# Patient Record
Sex: Female | Born: 1950
Health system: Southern US, Community
[De-identification: ages and names within clinical notes are randomized; demographics above are authoritative.]

## PROBLEM LIST (undated history)

## (undated) DIAGNOSIS — Z8739 Personal history of other diseases of the musculoskeletal system and connective tissue: Secondary | ICD-10-CM

## (undated) DIAGNOSIS — M1990 Unspecified osteoarthritis, unspecified site: Secondary | ICD-10-CM

## (undated) DIAGNOSIS — E7800 Pure hypercholesterolemia, unspecified: Secondary | ICD-10-CM

## (undated) DIAGNOSIS — N189 Chronic kidney disease, unspecified: Secondary | ICD-10-CM

## (undated) DIAGNOSIS — E785 Hyperlipidemia, unspecified: Secondary | ICD-10-CM

## (undated) DIAGNOSIS — Z973 Presence of spectacles and contact lenses: Secondary | ICD-10-CM

## (undated) DIAGNOSIS — Z87442 Personal history of urinary calculi: Secondary | ICD-10-CM

## (undated) DIAGNOSIS — N183 Chronic kidney disease, stage 3 unspecified: Secondary | ICD-10-CM

## (undated) DIAGNOSIS — D631 Anemia in chronic kidney disease: Secondary | ICD-10-CM

## (undated) DIAGNOSIS — Z860101 Personal history of adenomatous and serrated colon polyps: Secondary | ICD-10-CM

## (undated) DIAGNOSIS — H3530 Unspecified macular degeneration: Secondary | ICD-10-CM

## (undated) DIAGNOSIS — M7061 Trochanteric bursitis, right hip: Secondary | ICD-10-CM

## (undated) DIAGNOSIS — Z8669 Personal history of other diseases of the nervous system and sense organs: Secondary | ICD-10-CM

## (undated) DIAGNOSIS — N950 Postmenopausal bleeding: Secondary | ICD-10-CM

## (undated) DIAGNOSIS — J40 Bronchitis, not specified as acute or chronic: Secondary | ICD-10-CM

## (undated) DIAGNOSIS — D649 Anemia, unspecified: Secondary | ICD-10-CM

## (undated) DIAGNOSIS — G629 Polyneuropathy, unspecified: Secondary | ICD-10-CM

## (undated) DIAGNOSIS — T4145XA Adverse effect of unspecified anesthetic, initial encounter: Secondary | ICD-10-CM

## (undated) DIAGNOSIS — E119 Type 2 diabetes mellitus without complications: Secondary | ICD-10-CM

## (undated) DIAGNOSIS — T7840XA Allergy, unspecified, initial encounter: Secondary | ICD-10-CM

## (undated) DIAGNOSIS — D259 Leiomyoma of uterus, unspecified: Secondary | ICD-10-CM

## (undated) DIAGNOSIS — K635 Polyp of colon: Secondary | ICD-10-CM

## (undated) DIAGNOSIS — J302 Other seasonal allergic rhinitis: Secondary | ICD-10-CM

## (undated) DIAGNOSIS — I10 Essential (primary) hypertension: Secondary | ICD-10-CM

## (undated) DIAGNOSIS — R935 Abnormal findings on diagnostic imaging of other abdominal regions, including retroperitoneum: Secondary | ICD-10-CM

## (undated) DIAGNOSIS — K219 Gastro-esophageal reflux disease without esophagitis: Secondary | ICD-10-CM

## (undated) DIAGNOSIS — T8859XA Other complications of anesthesia, initial encounter: Secondary | ICD-10-CM

## (undated) DIAGNOSIS — K279 Peptic ulcer, site unspecified, unspecified as acute or chronic, without hemorrhage or perforation: Secondary | ICD-10-CM

## (undated) DIAGNOSIS — G2581 Restless legs syndrome: Secondary | ICD-10-CM

## (undated) DIAGNOSIS — Z8711 Personal history of peptic ulcer disease: Secondary | ICD-10-CM

## (undated) HISTORY — DX: Allergy, unspecified, initial encounter: T78.40XA

## (undated) HISTORY — PX: COLONOSCOPY: SHX174

## (undated) HISTORY — PX: COMBINED AUGMENTATION MAMMAPLASTY AND ABDOMINOPLASTY: SUR291

## (undated) HISTORY — DX: Chronic kidney disease, unspecified: N18.9

## (undated) HISTORY — PX: LAPAROSCOPIC GASTRIC BANDING: SHX1100

## (undated) HISTORY — PX: POLYPECTOMY: SHX149

## (undated) HISTORY — PX: BREAST SURGERY: SHX581

## (undated) HISTORY — PX: OTHER SURGICAL HISTORY: SHX169

## (undated) HISTORY — PX: CHOLECYSTECTOMY: SHX55

## (undated) HISTORY — DX: Peptic ulcer, site unspecified, unspecified as acute or chronic, without hemorrhage or perforation: K27.9

## (undated) HISTORY — PX: ABDOMINOPLASTY: SUR9

## (undated) HISTORY — PX: REDUCTION MAMMAPLASTY: SUR839

## (undated) HISTORY — DX: Polyp of colon: K63.5

---

## 1998-07-15 HISTORY — PX: CHOLECYSTECTOMY, LAPAROSCOPIC: SHX56

## 1998-07-15 HISTORY — PX: CHOLECYSTECTOMY: SHX55

## 2008-02-12 ENCOUNTER — Emergency Department (HOSPITAL_COMMUNITY): Admission: EM | Admit: 2008-02-12 | Discharge: 2008-02-12 | Payer: Self-pay | Admitting: Emergency Medicine

## 2008-03-13 ENCOUNTER — Emergency Department (HOSPITAL_COMMUNITY): Admission: EM | Admit: 2008-03-13 | Discharge: 2008-03-13 | Payer: Self-pay | Admitting: Emergency Medicine

## 2011-04-12 LAB — BASIC METABOLIC PANEL
BUN: 11
CO2: 28
Chloride: 107
Creatinine, Ser: 0.98
Glucose, Bld: 143 — ABNORMAL HIGH

## 2011-04-12 LAB — CBC
MCHC: 33.3
MCV: 79.6
Platelets: 225
RDW: 15.4

## 2011-04-12 LAB — URINE MICROSCOPIC-ADD ON

## 2011-04-12 LAB — DIFFERENTIAL
Basophils Relative: 0
Eosinophils Absolute: 0.1
Neutrophils Relative %: 81 — ABNORMAL HIGH

## 2011-04-12 LAB — URINALYSIS, ROUTINE W REFLEX MICROSCOPIC
Bilirubin Urine: NEGATIVE
Nitrite: NEGATIVE
Specific Gravity, Urine: 1.025
Urobilinogen, UA: 0.2

## 2012-07-15 HISTORY — PX: LAPAROSCOPIC GASTRIC BANDING: SHX1100

## 2015-06-29 DIAGNOSIS — I10 Essential (primary) hypertension: Secondary | ICD-10-CM | POA: Diagnosis not present

## 2015-06-29 DIAGNOSIS — H5200 Hypermetropia, unspecified eye: Secondary | ICD-10-CM | POA: Diagnosis not present

## 2015-06-29 DIAGNOSIS — E109 Type 1 diabetes mellitus without complications: Secondary | ICD-10-CM | POA: Diagnosis not present

## 2015-06-29 DIAGNOSIS — H5210 Myopia, unspecified eye: Secondary | ICD-10-CM | POA: Diagnosis not present

## 2015-06-29 DIAGNOSIS — E119 Type 2 diabetes mellitus without complications: Secondary | ICD-10-CM | POA: Diagnosis not present

## 2015-07-10 ENCOUNTER — Ambulatory Visit (HOSPITAL_COMMUNITY)
Admission: RE | Admit: 2015-07-10 | Discharge: 2015-07-10 | Disposition: A | Discharge status: Home / Self Care | Payer: Commercial Managed Care - HMO | Source: Ambulatory Visit | Attending: Surgery | Admitting: Surgery

## 2015-07-10 ENCOUNTER — Other Ambulatory Visit (HOSPITAL_COMMUNITY): Payer: Self-pay | Admitting: Surgery

## 2015-07-10 DIAGNOSIS — E669 Obesity, unspecified: Secondary | ICD-10-CM | POA: Diagnosis not present

## 2015-07-19 DIAGNOSIS — M1712 Unilateral primary osteoarthritis, left knee: Secondary | ICD-10-CM | POA: Diagnosis not present

## 2015-07-19 DIAGNOSIS — M170 Bilateral primary osteoarthritis of knee: Secondary | ICD-10-CM | POA: Diagnosis not present

## 2015-07-19 DIAGNOSIS — M1711 Unilateral primary osteoarthritis, right knee: Secondary | ICD-10-CM | POA: Diagnosis not present

## 2015-08-09 DIAGNOSIS — M170 Bilateral primary osteoarthritis of knee: Secondary | ICD-10-CM | POA: Diagnosis not present

## 2015-08-09 DIAGNOSIS — M1711 Unilateral primary osteoarthritis, right knee: Secondary | ICD-10-CM | POA: Diagnosis not present

## 2015-09-07 DIAGNOSIS — M170 Bilateral primary osteoarthritis of knee: Secondary | ICD-10-CM | POA: Diagnosis not present

## 2015-09-15 DIAGNOSIS — M170 Bilateral primary osteoarthritis of knee: Secondary | ICD-10-CM | POA: Diagnosis not present

## 2015-09-22 DIAGNOSIS — M25569 Pain in unspecified knee: Secondary | ICD-10-CM | POA: Diagnosis not present

## 2015-09-22 DIAGNOSIS — F321 Major depressive disorder, single episode, moderate: Secondary | ICD-10-CM | POA: Diagnosis not present

## 2015-09-22 DIAGNOSIS — Z6834 Body mass index (BMI) 34.0-34.9, adult: Secondary | ICD-10-CM | POA: Diagnosis not present

## 2015-09-22 DIAGNOSIS — E7800 Pure hypercholesterolemia, unspecified: Secondary | ICD-10-CM | POA: Diagnosis not present

## 2015-09-22 DIAGNOSIS — Z1389 Encounter for screening for other disorder: Secondary | ICD-10-CM | POA: Diagnosis not present

## 2015-09-22 DIAGNOSIS — E668 Other obesity: Secondary | ICD-10-CM | POA: Diagnosis not present

## 2015-09-22 DIAGNOSIS — R5383 Other fatigue: Secondary | ICD-10-CM | POA: Diagnosis not present

## 2015-09-22 DIAGNOSIS — E119 Type 2 diabetes mellitus without complications: Secondary | ICD-10-CM | POA: Diagnosis not present

## 2015-09-22 DIAGNOSIS — M170 Bilateral primary osteoarthritis of knee: Secondary | ICD-10-CM | POA: Diagnosis not present

## 2015-10-11 DIAGNOSIS — R69 Illness, unspecified: Secondary | ICD-10-CM | POA: Diagnosis not present

## 2015-10-23 DIAGNOSIS — I10 Essential (primary) hypertension: Secondary | ICD-10-CM | POA: Diagnosis not present

## 2015-10-23 DIAGNOSIS — F321 Major depressive disorder, single episode, moderate: Secondary | ICD-10-CM | POA: Diagnosis not present

## 2015-10-23 DIAGNOSIS — Z6834 Body mass index (BMI) 34.0-34.9, adult: Secondary | ICD-10-CM | POA: Diagnosis not present

## 2015-10-23 DIAGNOSIS — E119 Type 2 diabetes mellitus without complications: Secondary | ICD-10-CM | POA: Diagnosis not present

## 2015-10-23 DIAGNOSIS — E7800 Pure hypercholesterolemia, unspecified: Secondary | ICD-10-CM | POA: Diagnosis not present

## 2015-10-23 DIAGNOSIS — E668 Other obesity: Secondary | ICD-10-CM | POA: Diagnosis not present

## 2015-10-23 DIAGNOSIS — Z9884 Bariatric surgery status: Secondary | ICD-10-CM | POA: Diagnosis not present

## 2015-11-27 ENCOUNTER — Encounter (HOSPITAL_COMMUNITY): Payer: Self-pay | Admitting: Emergency Medicine

## 2015-11-27 ENCOUNTER — Emergency Department (HOSPITAL_COMMUNITY)
Admission: EM | Admit: 2015-11-27 | Discharge: 2015-11-27 | Disposition: A | Discharge status: Home / Self Care | Payer: Commercial Managed Care - HMO | Attending: Emergency Medicine | Admitting: Emergency Medicine

## 2015-11-27 ENCOUNTER — Emergency Department (HOSPITAL_COMMUNITY): Payer: Commercial Managed Care - HMO

## 2015-11-27 DIAGNOSIS — Z87891 Personal history of nicotine dependence: Secondary | ICD-10-CM | POA: Insufficient documentation

## 2015-11-27 DIAGNOSIS — R05 Cough: Secondary | ICD-10-CM | POA: Diagnosis not present

## 2015-11-27 DIAGNOSIS — J209 Acute bronchitis, unspecified: Secondary | ICD-10-CM | POA: Diagnosis not present

## 2015-11-27 DIAGNOSIS — J9801 Acute bronchospasm: Secondary | ICD-10-CM | POA: Diagnosis not present

## 2015-11-27 DIAGNOSIS — E119 Type 2 diabetes mellitus without complications: Secondary | ICD-10-CM | POA: Diagnosis not present

## 2015-11-27 DIAGNOSIS — I10 Essential (primary) hypertension: Secondary | ICD-10-CM | POA: Insufficient documentation

## 2015-11-27 DIAGNOSIS — J40 Bronchitis, not specified as acute or chronic: Secondary | ICD-10-CM | POA: Diagnosis not present

## 2015-11-27 HISTORY — DX: Type 2 diabetes mellitus without complications: E11.9

## 2015-11-27 HISTORY — DX: Pure hypercholesterolemia, unspecified: E78.00

## 2015-11-27 HISTORY — DX: Essential (primary) hypertension: I10

## 2015-11-27 MED ORDER — ALBUTEROL SULFATE (2.5 MG/3ML) 0.083% IN NEBU
2.5000 mg | INHALATION_SOLUTION | Freq: Once | RESPIRATORY_TRACT | Status: AC
  Administered 2015-11-27: 2.5 mg via RESPIRATORY_TRACT
  Filled 2015-11-27: qty 3

## 2015-11-27 MED ORDER — AZITHROMYCIN 250 MG PO TABS: 250.0000 mg | ORAL_TABLET | Freq: Every day | ORAL | Status: DC

## 2015-11-27 MED ORDER — BENZONATATE 200 MG PO CAPS: 200.0000 mg | ORAL_CAPSULE | Freq: Three times a day (TID) | ORAL | Status: DC | PRN

## 2015-11-27 MED ORDER — ALBUTEROL SULFATE HFA 108 (90 BASE) MCG/ACT IN AERS
2.0000 | INHALATION_SPRAY | RESPIRATORY_TRACT | Status: DC | PRN
  Administered 2015-11-27: 2 via RESPIRATORY_TRACT
  Filled 2015-11-27: qty 6.7

## 2015-11-27 MED ORDER — PREDNISONE 50 MG PO TABS
60.0000 mg | ORAL_TABLET | Freq: Once | ORAL | Status: AC
  Administered 2015-11-27: 60 mg via ORAL
  Filled 2015-11-27: qty 1

## 2015-11-27 MED ORDER — IPRATROPIUM-ALBUTEROL 0.5-2.5 (3) MG/3ML IN SOLN
3.0000 mL | Freq: Once | RESPIRATORY_TRACT | Status: AC
  Administered 2015-11-27: 3 mL via RESPIRATORY_TRACT
  Filled 2015-11-27: qty 3

## 2015-11-27 NOTE — Discharge Instructions (Signed)
Use the inhaler 2 puffs every 4 hours as needed for wheezing. Take the cough pills and the antibiotics as directed. Follow up with your doctor or return here as needed for worsening symptoms.

## 2015-11-27 NOTE — ED Notes (Signed)
Patient reports had cold symptoms about a week ago. States she started feeling better, but now has a congested cough and feels as if she is wheezing at times. Patient also complains of post nasal drip.

## 2015-11-27 NOTE — ED Provider Notes (Signed)
CSN: [id number]     Arrival date & time 11/27/15  1947 History   First MD Initiated Contact with Patient 11/27/15 2033     Chief Complaint  Patient presents with  . Cough     (Consider location/radiation/quality/duration/timing/severity/associated sxs/prior Treatment) Patient is a 65 y.o. female presenting with cough. The history is provided by the patient.  Cough Cough characteristics:  Productive Severity:  Moderate Onset quality:  Gradual Duration:  1 week Timing:  Intermittent Chronicity:  New Smoker: former smoker.   Relieved by:  Nothing Worsened by:  Lying down Ineffective treatments:  None tried Associated symptoms: wheezing     Amy Reyes is a 65 y.o. female who presents to the ED with cough and wheezing that started about a week ago. She reports that the cold symptoms improved but she continues to wheeze and have some coughing. She denies any other problems.   Past Medical History  Diagnosis Date  . Hypertension   . Diabetes mellitus without complication ([place])   . Hypercholesteremia    Past Surgical History  Procedure Laterality Date  . Laparoscopic gastric banding     History reviewed. No pertinent family history. Social History  Substance Use Topics  . Smoking status: Former Research scientist (life sciences)  . Smokeless tobacco: None  . Alcohol Use: No   OB History    No data available     Review of Systems  HENT: Positive for congestion.   Respiratory: Positive for cough and wheezing.   all other systems negative    Allergies  Review of patient's allergies indicates no known allergies.  Home Medications   Prior to Admission medications   Medication Sig Start Date End Date Taking? Authorizing Provider  azithromycin (ZITHROMAX) 250 MG tablet Take 1 tablet (250 mg total) by mouth daily. Take first 2 tablets together, then 1 every day until finished. 11/27/15   Hope Bunnie Pion, NP  benzonatate (TESSALON) 200 MG capsule Take 1 capsule (200 mg total) by mouth 3 (three)  times daily as needed for cough. 11/27/15   Hope Bunnie Pion, NP   BP 140/62 mmHg  Pulse 64  Temp(Src) 98.7 F (37.1 C) (Oral)  Resp 20  Ht 5\' 6"  (1.676 m)  Wt 92.987 kg  BMI 33.10 kg/m2  SpO2 100% Physical Exam  Constitutional: She is oriented to person, place, and time. She appears well-developed and well-nourished. No distress.  HENT:  Head: Normocephalic and atraumatic.  Eyes: EOM are normal.  Neck: Neck supple.  Cardiovascular: Normal rate and regular rhythm.   Pulmonary/Chest: Effort normal. No respiratory distress. She has decreased breath sounds. She has wheezes.  Expiratory wheezes bilateral  Abdominal: Soft. There is no tenderness.  Musculoskeletal: Normal range of motion.  Neurological: She is alert and oriented to person, place, and time. No cranial nerve deficit.  Skin: Skin is warm and dry.  Psychiatric: She has a normal mood and affect. Her behavior is normal.  Nursing note and vitals reviewed.   ED Course  Procedures (including critical care time) Discussed with Dr. Roderic Palau, will start Z-pak, cough medication, albuterol inhaler Patient declined course of steroids due to weight issues and effect steroids has on her appetite. Will give one dose of 60 mg x1 now.  Labs Review Labs Reviewed - No data to display  Imaging Review Dg Chest 2 View  11/27/2015  CLINICAL DATA:  Patient reports had cold symptoms about a week ago. States she started feeling better, but now has a congested cough and feels as  if she is wheezing at times. Patient also complains of post nasal drip. EXAM: CHEST  2 VIEW COMPARISON:  None. FINDINGS: Normal mediastinum and cardiac silhouette. Normal pulmonary vasculature. No evidence of effusion, infiltrate, or pneumothorax. No acute bony abnormality. Gastric banding device noted and appears in typical orientation IMPRESSION: No acute cardiopulmonary process. Electronically Signed   By: Suzy Bouchard M.D.   On: 11/27/2015 20:34    MDM  65 y.o. female  with cough and wheezing x 1 weeks stable for d/c without respiratory distress and O2 SAT 100% on R/A and normal CXR. Discussed with the patient clinical and x-ray findings and plan of care and all questioned fully answered. She will follow up with her PCP or return here if any problems arise.   Final diagnoses:  Bronchitis with bronchospasm       Ashley Murrain, NP 11/27/15 2130  Milton Ferguson, MD 11/28/15 [phone]

## 2015-11-27 NOTE — ED Notes (Signed)
Pt with recent cold symptoms, productive. Denies fever, post nasal drip as well per pt

## 2015-12-15 DIAGNOSIS — Z0100 Encounter for examination of eyes and vision without abnormal findings: Secondary | ICD-10-CM | POA: Diagnosis not present

## 2016-04-10 DIAGNOSIS — M170 Bilateral primary osteoarthritis of knee: Secondary | ICD-10-CM | POA: Diagnosis not present

## 2016-04-29 DIAGNOSIS — I10 Essential (primary) hypertension: Secondary | ICD-10-CM | POA: Diagnosis not present

## 2016-04-29 DIAGNOSIS — E119 Type 2 diabetes mellitus without complications: Secondary | ICD-10-CM | POA: Diagnosis not present

## 2016-04-30 DIAGNOSIS — F321 Major depressive disorder, single episode, moderate: Secondary | ICD-10-CM | POA: Diagnosis not present

## 2016-04-30 DIAGNOSIS — Z0000 Encounter for general adult medical examination without abnormal findings: Secondary | ICD-10-CM | POA: Diagnosis not present

## 2016-04-30 DIAGNOSIS — Z79890 Hormone replacement therapy (postmenopausal): Secondary | ICD-10-CM | POA: Diagnosis not present

## 2016-04-30 DIAGNOSIS — E7800 Pure hypercholesterolemia, unspecified: Secondary | ICD-10-CM | POA: Diagnosis not present

## 2016-04-30 DIAGNOSIS — E1129 Type 2 diabetes mellitus with other diabetic kidney complication: Secondary | ICD-10-CM | POA: Diagnosis not present

## 2016-04-30 DIAGNOSIS — E668 Other obesity: Secondary | ICD-10-CM | POA: Diagnosis not present

## 2016-04-30 DIAGNOSIS — R05 Cough: Secondary | ICD-10-CM | POA: Diagnosis not present

## 2016-04-30 DIAGNOSIS — D692 Other nonthrombocytopenic purpura: Secondary | ICD-10-CM | POA: Diagnosis not present

## 2016-04-30 DIAGNOSIS — M25569 Pain in unspecified knee: Secondary | ICD-10-CM | POA: Diagnosis not present

## 2016-05-03 DIAGNOSIS — Z1212 Encounter for screening for malignant neoplasm of rectum: Secondary | ICD-10-CM | POA: Diagnosis not present

## 2016-05-23 DIAGNOSIS — M25561 Pain in right knee: Secondary | ICD-10-CM | POA: Diagnosis not present

## 2016-05-23 DIAGNOSIS — G8929 Other chronic pain: Secondary | ICD-10-CM | POA: Diagnosis not present

## 2016-05-23 DIAGNOSIS — M25562 Pain in left knee: Secondary | ICD-10-CM | POA: Diagnosis not present

## 2016-05-23 DIAGNOSIS — M170 Bilateral primary osteoarthritis of knee: Secondary | ICD-10-CM | POA: Diagnosis not present

## 2016-09-02 ENCOUNTER — Encounter (HOSPITAL_COMMUNITY): Admission: RE | Payer: Self-pay | Source: Ambulatory Visit

## 2016-09-02 ENCOUNTER — Inpatient Hospital Stay (HOSPITAL_COMMUNITY)
Admission: RE | Admit: 2016-09-02 | Payer: Commercial Managed Care - HMO | Source: Ambulatory Visit | Admitting: Orthopedic Surgery

## 2016-09-02 SURGERY — ARTHROPLASTY, KNEE, TOTAL
Anesthesia: Choice | Site: Knee | Laterality: Right

## 2016-10-03 ENCOUNTER — Ambulatory Visit: Payer: Self-pay | Admitting: Orthopedic Surgery

## 2016-10-03 DIAGNOSIS — I129 Hypertensive chronic kidney disease with stage 1 through stage 4 chronic kidney disease, or unspecified chronic kidney disease: Secondary | ICD-10-CM | POA: Diagnosis not present

## 2016-10-03 DIAGNOSIS — M25562 Pain in left knee: Secondary | ICD-10-CM | POA: Diagnosis not present

## 2016-10-03 DIAGNOSIS — E7800 Pure hypercholesterolemia, unspecified: Secondary | ICD-10-CM | POA: Diagnosis not present

## 2016-10-03 DIAGNOSIS — E668 Other obesity: Secondary | ICD-10-CM | POA: Diagnosis not present

## 2016-10-03 DIAGNOSIS — M25561 Pain in right knee: Secondary | ICD-10-CM | POA: Diagnosis not present

## 2016-10-03 DIAGNOSIS — R808 Other proteinuria: Secondary | ICD-10-CM | POA: Diagnosis not present

## 2016-10-03 DIAGNOSIS — N183 Chronic kidney disease, stage 3 (moderate): Secondary | ICD-10-CM | POA: Diagnosis not present

## 2016-10-03 DIAGNOSIS — Z01818 Encounter for other preprocedural examination: Secondary | ICD-10-CM | POA: Diagnosis not present

## 2016-10-03 DIAGNOSIS — D631 Anemia in chronic kidney disease: Secondary | ICD-10-CM | POA: Diagnosis not present

## 2016-10-07 NOTE — Progress Notes (Signed)
Clearance Dr. Osborne Casco 3/22 /18 chart EKG 10/03/16 chart cxr 10/03/16 chart  CMP 10/03/16 chart   CBC 10/03/16 chart abnormal hgb  Ov 10/03/16 chart

## 2016-10-07 NOTE — Patient Instructions (Signed)
Amy Reyes  10/07/2016   Your procedure is scheduled on: 10/14/16  Report to [facility] Main  Entrance to admitting  at    0725 AM.  Call this number if you have problems the morning of surgery [phone]   Remember: ONLY 1 PERSON MAY GO WITH YOU TO SHORT STAY TO GET  READY MORNING OF [place].  Do not eat food or drink liquids :After Midnight.     Take these medicines the morning of surgery with A SIP OF WATER: NONE DO NOT TAKE ANY DIABETIC MEDICATIONS DAY OF YOUR SURGERY                               You may not have any metal on your body including hair pins and              piercings  Do not wear jewelry, make-up, lotions, powders or perfumes, deodorant             Do not wear nail polish.  Do not shave  48 hours prior to surgery.                Do not bring valuables to the hospital. [place].  Contacts, dentures or bridgework may not be worn into surgery.  Leave suitcase in the car. After surgery it may be brought to your room.                  Please read over the following fact sheets you were given: _____________________________________________________________________             [facility] - Preparing for Surgery Before surgery, you can play an important role.  Because skin is not sterile, your skin needs to be as free of germs as possible.  You can reduce the number of germs on your skin by washing with CHG (chlorahexidine gluconate) soap before surgery.  CHG is an antiseptic cleaner which kills germs and bonds with the skin to continue killing germs even after washing. Please DO NOT use if you have an allergy to CHG or antibacterial soaps.  If your skin becomes reddened/irritated stop using the CHG and inform your nurse when you arrive at Short Stay. Do not shave (including legs and underarms) for at least 48 hours prior to the first CHG shower.  You may shave your face/neck. Please  follow these instructions carefully:  1.  Shower with CHG Soap the night before surgery and the  morning of Surgery.  2.  If you choose to wash your hair, wash your hair first as usual with your  normal  shampoo.  3.  After you shampoo, rinse your hair and body thoroughly to remove the  shampoo.                           4.  Use CHG as you would any other liquid soap.  You can apply chg directly  to the skin and wash                       Gently with a scrungie or clean washcloth.  5.  Apply the CHG Soap to your body  ONLY FROM THE NECK DOWN.   Do not use on face/ open                           Wound or open sores. Avoid contact with eyes, ears mouth and genitals (private parts).                       Wash face,  Genitals (private parts) with your normal soap.             6.  Wash thoroughly, paying special attention to the area where your surgery  will be performed.  7.  Thoroughly rinse your body with warm water from the neck down.  8.  DO NOT shower/wash with your normal soap after using and rinsing off  the CHG Soap.                9.  Pat yourself dry with a clean towel.            10.  Wear clean pajamas.            11.  Place clean sheets on your bed the night of your first shower and do not  sleep with pets. Day of Surgery : Do not apply any lotions/deodorants the morning of surgery.  Please wear clean clothes to the hospital/surgery center.  FAILURE TO FOLLOW THESE INSTRUCTIONS MAY RESULT IN THE CANCELLATION OF YOUR SURGERY PATIENT SIGNATURE_________________________________  NURSE SIGNATURE__________________________________  ________________________________________________________________________  WHAT IS A BLOOD TRANSFUSION? Blood Transfusion Information  A transfusion is the replacement of blood or some of its parts. Blood is made up of multiple cells which provide different functions.  Red blood cells carry oxygen and are used for blood loss replacement.  White blood cells  fight against infection.  Platelets control bleeding.  Plasma helps clot blood.  Other blood products are available for specialized needs, such as hemophilia or other clotting disorders. BEFORE THE TRANSFUSION  Who gives blood for transfusions?   Healthy volunteers who are fully evaluated to make sure their blood is safe. This is blood bank blood. Transfusion therapy is the safest it has ever been in the practice of medicine. Before blood is taken from a donor, a complete history is taken to make sure that person has no history of diseases nor engages in risky social behavior (examples are intravenous drug use or sexual activity with multiple partners). The donor's travel history is screened to minimize risk of transmitting infections, such as malaria. The donated blood is tested for signs of infectious diseases, such as HIV and hepatitis. The blood is then tested to be sure it is compatible with you in order to minimize the chance of a transfusion reaction. If you or a relative donates blood, this is often done in anticipation of surgery and is not appropriate for emergency situations. It takes many days to process the donated blood. RISKS AND COMPLICATIONS Although transfusion therapy is very safe and saves many lives, the main dangers of transfusion include:   Getting an infectious disease.  Developing a transfusion reaction. This is an allergic reaction to something in the blood you were given. Every precaution is taken to prevent this. The decision to have a blood transfusion has been considered carefully by your caregiver before blood is given. Blood is not given unless the benefits outweigh the risks. AFTER THE TRANSFUSION  Right after receiving a blood transfusion, you will usually feel much  better and more energetic. This is especially true if your red blood cells have gotten low (anemic). The transfusion raises the level of the red blood cells which carry oxygen, and this usually  causes an energy increase.  The nurse administering the transfusion will monitor you carefully for complications. HOME CARE INSTRUCTIONS  No special instructions are needed after a transfusion. You may find your energy is better. Speak with your caregiver about any limitations on activity for underlying diseases you may have. SEEK MEDICAL CARE IF:   Your condition is not improving after your transfusion.  You develop redness or irritation at the intravenous (IV) site. SEEK IMMEDIATE MEDICAL CARE IF:  Any of the following symptoms occur over the next 12 hours:  Shaking chills.  You have a temperature by mouth above 102 F (38.9 C), not controlled by medicine.  Chest, back, or muscle pain.  People around you feel you are not acting correctly or are confused.  Shortness of breath or difficulty breathing.  Dizziness and fainting.  You get a rash or develop hives.  You have a decrease in urine output.  Your urine turns a dark color or changes to pink, red, or brown. Any of the following symptoms occur over the next 10 days:  You have a temperature by mouth above 102 F (38.9 C), not controlled by medicine.  Shortness of breath.  Weakness after normal activity.  The white part of the eye turns yellow (jaundice).  You have a decrease in the amount of urine or are urinating less often.  Your urine turns a dark color or changes to pink, red, or brown. Document Released: 06/28/2000 Document Revised: 09/23/2011 Document Reviewed: 02/15/2008 ExitCare Patient Information 2014 [place].  _______________________________________________________________________  Incentive Spirometer  An incentive spirometer is a tool that can help keep your lungs clear and active. This tool measures how well you are filling your lungs with each breath. Taking long deep breaths may help reverse or decrease the chance of developing breathing (pulmonary) problems (especially infection)  following:  A long period of time when you are unable to move or be active. BEFORE THE PROCEDURE   If the spirometer includes an indicator to show your best effort, your nurse or respiratory therapist will set it to a desired goal.  If possible, sit up straight or lean slightly forward. Try not to slouch.  Hold the incentive spirometer in an upright position. INSTRUCTIONS FOR USE  1. Sit on the edge of your bed if possible, or sit up as far as you can in bed or on a chair. 2. Hold the incentive spirometer in an upright position. 3. Breathe out normally. 4. Place the mouthpiece in your mouth and seal your lips tightly around it. 5. Breathe in slowly and as deeply as possible, raising the piston or the ball toward the top of the column. 6. Hold your breath for 3-5 seconds or for as long as possible. Allow the piston or ball to fall to the bottom of the column. 7. Remove the mouthpiece from your mouth and breathe out normally. 8. Rest for a few seconds and repeat Steps 1 through 7 at least 10 times every 1-2 hours when you are awake. Take your time and take a few normal breaths between deep breaths. 9. The spirometer may include an indicator to show your best effort. Use the indicator as a goal to work toward during each repetition. 10. After each set of 10 deep breaths, practice coughing to be sure  your lungs are clear. If you have an incision (the cut made at the time of surgery), support your incision when coughing by placing a pillow or rolled up towels firmly against it. Once you are able to get out of bed, walk around indoors and cough well. You may stop using the incentive spirometer when instructed by your caregiver.  RISKS AND COMPLICATIONS  Take your time so you do not get dizzy or light-headed.  If you are in pain, you may need to take or ask for pain medication before doing incentive spirometry. It is harder to take a deep breath if you are having pain. AFTER USE  Rest and  breathe slowly and easily.  It can be helpful to keep track of a log of your progress. Your caregiver can provide you with a simple table to help with this. If you are using the spirometer at home, follow these instructions: Reader IF:   You are having difficultly using the spirometer.  You have trouble using the spirometer as often as instructed.  Your pain medication is not giving enough relief while using the spirometer.  You develop fever of 100.5 F (38.1 C) or higher. SEEK IMMEDIATE MEDICAL CARE IF:   You cough up bloody sputum that had not been present before.  You develop fever of 102 F (38.9 C) or greater.  You develop worsening pain at or near the incision site. MAKE SURE YOU:   Understand these instructions.  Will watch your condition.  Will get help right away if you are not doing well or get worse. Document Released: 11/11/2006 Document Revised: 09/23/2011 Document Reviewed: 01/12/2007 [facility] Patient Information 2014 [place], [place].   ________________________________________________________________________

## 2016-10-08 ENCOUNTER — Encounter (HOSPITAL_COMMUNITY): Payer: Self-pay

## 2016-10-08 ENCOUNTER — Encounter (HOSPITAL_COMMUNITY)
Admission: RE | Admit: 2016-10-08 | Discharge: 2016-10-08 | Disposition: A | Discharge status: Home / Self Care | Payer: Medicare HMO | Source: Ambulatory Visit | Attending: Orthopedic Surgery | Admitting: Orthopedic Surgery

## 2016-10-08 DIAGNOSIS — M1711 Unilateral primary osteoarthritis, right knee: Secondary | ICD-10-CM | POA: Diagnosis not present

## 2016-10-08 DIAGNOSIS — Z01818 Encounter for other preprocedural examination: Secondary | ICD-10-CM | POA: Insufficient documentation

## 2016-10-08 HISTORY — DX: Adverse effect of unspecified anesthetic, initial encounter: T41.45XA

## 2016-10-08 HISTORY — DX: Other complications of anesthesia, initial encounter: T88.59XA

## 2016-10-08 HISTORY — DX: Unspecified osteoarthritis, unspecified site: M19.90

## 2016-10-08 HISTORY — DX: Bronchitis, not specified as acute or chronic: J40

## 2016-10-08 HISTORY — DX: Personal history of urinary calculi: Z87.442

## 2016-10-08 HISTORY — DX: Anemia, unspecified: D64.9

## 2016-10-08 LAB — ABO/RH: ABO/RH(D): A POS

## 2016-10-08 LAB — GLUCOSE, CAPILLARY: GLUCOSE-CAPILLARY: 82 mg/dL (ref 65–99)

## 2016-10-08 LAB — SURGICAL PCR SCREEN
MRSA, PCR: INVALID — AB
Staphylococcus aureus: INVALID — AB

## 2016-10-08 LAB — PROTIME-INR
INR: 0.98
Prothrombin Time: 13 seconds (ref 11.4–15.2)

## 2016-10-08 LAB — APTT: aPTT: 33 seconds (ref 24–36)

## 2016-10-09 ENCOUNTER — Ambulatory Visit: Payer: Self-pay | Admitting: Orthopedic Surgery

## 2016-10-09 LAB — HEMOGLOBIN A1C
HEMOGLOBIN A1C: 6.2 % — AB (ref 4.8–5.6)
MEAN PLASMA GLUCOSE: 131 mg/dL

## 2016-10-09 NOTE — H&P (Signed)
Nonie Hoyer DOB: 1951-05-07 Single / Language: Cleophus Molt / Race: Black or African American Female Date of Admission:  10/15/2106 CC:  Right Knee Pain History of Present Illness  The patient is a 66 year old female who comes in for a preoperative History and Physical. The patient is scheduled for a right total knee arthroplasty to be performed by Dr. Dione Plover. Aluisio, MD at [facility] on 10-14-2016. The patient is a 66 year old female who presented for follow up of their knee. The patient is being followed for their bilateral knee pain and osteoarthritis. They are now months out from cortisone injections (and from Euflexxa series). Symptoms reported include: pain, swelling, aching, stiffness, grinding and difficulty ambulating. The patient feels that they are doing poorly and report their pain level to be severe. The following medication has been used for pain control: Tylenol (does not really help. She states that her PCP took her off Diclofenac due to decreased kidney funtion on recent labs. She is still using Voltaren gel occasionally). The patient has not gotten any relief of their symptoms with Cortisone injections or viscosupplementation. She states that she is very discouraged since none of the injections have worked. She states that she previously has a series of visco several years ago while she was in [place].  Unfortunately, her right knee is getting progressively worse. She has also had a cortisone in the right knee which provided short-term benefit, but she said that she cannot tolerate, however, her right knee is doing anymore and it is really limiting her. She says it is at a stage where she is ready to get it fixed. They have been treated conservatively in the past for the above stated problem and despite conservative measures, they continue to have progressive pain and severe functional limitations and dysfunction. They have failed non-operative management including home  exercise, medications, and injections. It is felt that they would benefit from undergoing total joint replacement. Risks and benefits of the procedure have been discussed with the patient and they elect to proceed with surgery. There are no active contraindications to surgery such as ongoing infection or rapidly progressive neurological disease.  Problem List/Past Medical Chronic pain of left knee (M25.562)  Primary osteoarthritis of both knees (M17.0)  High blood pressure  Hypercholesterolemia  Kidney Stone  Gastroesophageal Reflux Disease  Non-Insulin Dependent Diabetes Mellitus  Anemia of Chronic Renal Failure  Chronic Kidney Disease Stage III  Microalbuminuria  Obesity    Allergies No Known Drug Allergies  Family History  Cancer  Father. Depression  Brother. Diabetes Mellitus  Maternal Grandfather. Drug / Alcohol Addiction  child First Degree Relatives  reported Hypertension  Brother, Father, Mother, Sister. Rheumatoid Arthritis  Sister. Severe allergy  child  Social History Children  2 Current work status  disabled Exercise  Exercises rarely; does other Former drinker  07/19/2015: In the past drank Living situation  live alone Marital status  single No history of drug/alcohol rehab  Not under pain contract  Number of flights of stairs before winded  greater than 5 Tobacco / smoke exposure  07/19/2015: no Tobacco use  Former smoker. 07/19/2015: smoke(d) 1 pack(s) per day  Medication History  Aleve Active. Tylenol (Oral) Specific strength unknown - Active. Atorvastatin Calcium (20MG  Tablet, Oral) Active. Losartan Potassium (100MG  Tablet, Oral) Active. MetFORMIN HCl (500MG  Tablet, Oral) Active. MedroxyPROGESTERone Acetate (5MG  Tablet, Oral) Active. Estradiol (0.5MG  Tablet, Oral) Active. Multivitamin (Oral) Active. Fish Oil (Oral) Specific strength unknown - Active.   Past  Surgical History Cesarean Delivery  1  time Gallbladder Surgery  laporoscopic Mammoplasty; Reduction  bilateral   Review of Systems  General Not Present- Chills, Fatigue, Fever, Memory Loss, Night Sweats, Weight Gain and Weight Loss. Skin Not Present- Eczema, Hives, Itching, Lesions and Rash. HEENT Not Present- Dentures, Double Vision, Headache, Hearing Loss, Tinnitus and Visual Loss. Respiratory Not Present- Allergies, Chronic Cough, Coughing up blood, Shortness of breath at rest and Shortness of breath with exertion. Cardiovascular Not Present- Chest Pain, Difficulty Breathing Lying Down, Murmur, Palpitations, Racing/skipping heartbeats and Swelling. Gastrointestinal Not Present- Abdominal Pain, Bloody Stool, Constipation, Diarrhea, Difficulty Swallowing, Heartburn, Jaundice, Loss of appetitie, Nausea and Vomiting. Female Genitourinary Not Present- Blood in Urine, Discharge, Flank Pain, Incontinence, Painful Urination, Urgency, Urinary frequency, Urinary Retention, Urinating at Night and Weak urinary stream. Musculoskeletal Present- Joint Pain (right knee). Not Present- Back Pain, Joint Swelling, Morning Stiffness, Muscle Pain, Muscle Weakness and Spasms. Neurological Not Present- Blackout spells, Difficulty with balance, Dizziness, Paralysis, Tremor and Weakness. Psychiatric Not Present- Insomnia.  Vitals  Weight: 210 lb Height: 66.5in Weight was reported by patient. Height was reported by patient. Body Surface Area: 2.05 m Body Mass Index: 33.39 kg/m  Pulse: 68 (Regular)  BP: 126/64 (Sitting, Right Arm, Standard)   Physical Exam  General Mental Status -Alert, cooperative and good historian. General Appearance-pleasant, Not in acute distress. Orientation-Oriented X3. Build & Nutrition-Well nourished and Well developed.  Head and Neck Head-normocephalic, atraumatic . Neck Global Assessment - supple, no bruit auscultated on the right, no bruit auscultated on the left.  Eye Vision-Wears  contact lenses(left eye). Pupil - Bilateral-Regular and Round. Motion - Bilateral-EOMI.  Chest and Lung Exam Auscultation Breath sounds - clear at anterior chest wall and clear at posterior chest wall. Adventitious sounds - No Adventitious sounds.  Cardiovascular Auscultation Rhythm - Regular rate and rhythm. Heart Sounds - S1 WNL and S2 WNL. Murmurs & Other Heart Sounds - Auscultation of the heart reveals - No Murmurs.  Abdomen Palpation/Percussion Tenderness - Abdomen is non-tender to palpation. Rigidity (guarding) - Abdomen is soft. Auscultation Auscultation of the abdomen reveals - Bowel sounds normal.  Female Genitourinary Note: Not done, not pertinent to present illness   Musculoskeletal Note: On exam, she is alert and oriented, in no apparent distress. Her hips show normal range of motion with no discomfort. Her right knee shows no effusion. Her range of motion of the right knee is about 5 to 115. There is moderate crepitus on range of motion with tenderness medial greater than lateral and no instability. Her left knee shows no effusion. Range of about 5 to 130. Moderate crepitus on range of motion. Tender medial greater than lateral with no instability noted. Pulse, sensation and motor are intact. She has significantly antalgic gait pattern.  Her radiographs, AP both knees and lateral reviewed and she has got significant medial and patellofemoral arthritis in both knees with bone-on-bone changes and some slight tibial subluxation.   Assessment & Plan  Primary osteoarthritis of both knees (M17.0)  Note:Surgical Plans: Right Total Knee Replacement  Disposition: Home with family member  PCP: Dr. Domenick Gong - Patient has been seen preoperatively and felt to be stable for surgery.  IV TXA  Anesthesia Issues: None  Patient was instructed on what medications to stop prior to surgery.  Signed electronically by Joelene Millin, III PA-C

## 2016-10-10 LAB — MRSA CULTURE: Culture: NOT DETECTED

## 2016-10-14 ENCOUNTER — Inpatient Hospital Stay (HOSPITAL_COMMUNITY): Payer: Medicare HMO | Admitting: Anesthesiology

## 2016-10-14 ENCOUNTER — Inpatient Hospital Stay (HOSPITAL_COMMUNITY)
Admission: RE | Admit: 2016-10-14 | Discharge: 2016-10-17 | DRG: 470 | Disposition: A | Discharge status: Home Health | Payer: Medicare HMO | Source: Ambulatory Visit | Attending: Orthopedic Surgery | Admitting: Orthopedic Surgery

## 2016-10-14 ENCOUNTER — Encounter (HOSPITAL_COMMUNITY)
Admission: RE | Disposition: A | Discharge status: Home Health | Payer: Self-pay | Source: Ambulatory Visit | Attending: Orthopedic Surgery

## 2016-10-14 ENCOUNTER — Encounter (HOSPITAL_COMMUNITY): Payer: Self-pay | Admitting: *Deleted

## 2016-10-14 DIAGNOSIS — N183 Chronic kidney disease, stage 3 (moderate): Secondary | ICD-10-CM | POA: Diagnosis not present

## 2016-10-14 DIAGNOSIS — M179 Osteoarthritis of knee, unspecified: Secondary | ICD-10-CM | POA: Diagnosis present

## 2016-10-14 DIAGNOSIS — Z833 Family history of diabetes mellitus: Secondary | ICD-10-CM | POA: Diagnosis not present

## 2016-10-14 DIAGNOSIS — Z79890 Hormone replacement therapy (postmenopausal): Secondary | ICD-10-CM | POA: Diagnosis not present

## 2016-10-14 DIAGNOSIS — Z818 Family history of other mental and behavioral disorders: Secondary | ICD-10-CM

## 2016-10-14 DIAGNOSIS — E119 Type 2 diabetes mellitus without complications: Secondary | ICD-10-CM

## 2016-10-14 DIAGNOSIS — E1122 Type 2 diabetes mellitus with diabetic chronic kidney disease: Secondary | ICD-10-CM | POA: Diagnosis not present

## 2016-10-14 DIAGNOSIS — Z811 Family history of alcohol abuse and dependence: Secondary | ICD-10-CM | POA: Diagnosis not present

## 2016-10-14 DIAGNOSIS — M1711 Unilateral primary osteoarthritis, right knee: Secondary | ICD-10-CM | POA: Diagnosis present

## 2016-10-14 DIAGNOSIS — M62838 Other muscle spasm: Secondary | ICD-10-CM | POA: Diagnosis not present

## 2016-10-14 DIAGNOSIS — Z6833 Body mass index (BMI) 33.0-33.9, adult: Secondary | ICD-10-CM

## 2016-10-14 DIAGNOSIS — Z8249 Family history of ischemic heart disease and other diseases of the circulatory system: Secondary | ICD-10-CM | POA: Diagnosis not present

## 2016-10-14 DIAGNOSIS — Z87891 Personal history of nicotine dependence: Secondary | ICD-10-CM | POA: Diagnosis not present

## 2016-10-14 DIAGNOSIS — D631 Anemia in chronic kidney disease: Secondary | ICD-10-CM | POA: Diagnosis not present

## 2016-10-14 DIAGNOSIS — K219 Gastro-esophageal reflux disease without esophagitis: Secondary | ICD-10-CM | POA: Diagnosis present

## 2016-10-14 DIAGNOSIS — Z8261 Family history of arthritis: Secondary | ICD-10-CM | POA: Diagnosis not present

## 2016-10-14 DIAGNOSIS — Z7984 Long term (current) use of oral hypoglycemic drugs: Secondary | ICD-10-CM | POA: Diagnosis not present

## 2016-10-14 DIAGNOSIS — E7800 Pure hypercholesterolemia, unspecified: Secondary | ICD-10-CM | POA: Diagnosis not present

## 2016-10-14 DIAGNOSIS — M170 Bilateral primary osteoarthritis of knee: Principal | ICD-10-CM | POA: Diagnosis present

## 2016-10-14 DIAGNOSIS — M1710 Unilateral primary osteoarthritis, unspecified knee: Secondary | ICD-10-CM | POA: Diagnosis not present

## 2016-10-14 DIAGNOSIS — E669 Obesity, unspecified: Secondary | ICD-10-CM | POA: Diagnosis present

## 2016-10-14 DIAGNOSIS — G8918 Other acute postprocedural pain: Secondary | ICD-10-CM | POA: Diagnosis not present

## 2016-10-14 DIAGNOSIS — I129 Hypertensive chronic kidney disease with stage 1 through stage 4 chronic kidney disease, or unspecified chronic kidney disease: Secondary | ICD-10-CM | POA: Diagnosis not present

## 2016-10-14 DIAGNOSIS — J40 Bronchitis, not specified as acute or chronic: Secondary | ICD-10-CM | POA: Diagnosis not present

## 2016-10-14 DIAGNOSIS — M1712 Unilateral primary osteoarthritis, left knee: Secondary | ICD-10-CM | POA: Diagnosis not present

## 2016-10-14 DIAGNOSIS — I10 Essential (primary) hypertension: Secondary | ICD-10-CM | POA: Diagnosis not present

## 2016-10-14 DIAGNOSIS — M25561 Pain in right knee: Secondary | ICD-10-CM | POA: Diagnosis present

## 2016-10-14 HISTORY — PX: TOTAL KNEE ARTHROPLASTY: SHX125

## 2016-10-14 HISTORY — PX: INJECTION KNEE: SHX2446

## 2016-10-14 LAB — TYPE AND SCREEN
ABO/RH(D): A POS
Antibody Screen: NEGATIVE

## 2016-10-14 LAB — GLUCOSE, CAPILLARY
GLUCOSE-CAPILLARY: 85 mg/dL (ref 65–99)
Glucose-Capillary: 97 mg/dL (ref 65–99)
Glucose-Capillary: 279 mg/dL — ABNORMAL HIGH (ref 65–99)
Glucose-Capillary: 241 mg/dL — ABNORMAL HIGH (ref 65–99)

## 2016-10-14 SURGERY — ARTHROPLASTY, KNEE, TOTAL
Anesthesia: Monitor Anesthesia Care | Site: Knee | Laterality: Right

## 2016-10-14 MED ORDER — BUPIVACAINE HCL (PF) 0.25 % IJ SOLN
INTRAMUSCULAR | Status: AC
  Filled 2016-10-14: qty 30

## 2016-10-14 MED ORDER — ACETAMINOPHEN 325 MG PO TABS
650.0000 mg | ORAL_TABLET | Freq: Four times a day (QID) | ORAL | Status: DC | PRN
  Administered 2016-10-16: 650 mg via ORAL
  Filled 2016-10-14: qty 2

## 2016-10-14 MED ORDER — BUPIVACAINE-EPINEPHRINE (PF) 0.5% -1:200000 IJ SOLN
INTRAMUSCULAR | Status: DC | PRN
  Administered 2016-10-14: 20 mL via PERINEURAL

## 2016-10-14 MED ORDER — HYDROCHLOROTHIAZIDE 25 MG PO TABS
25.0000 mg | ORAL_TABLET | Freq: Every day | ORAL | Status: DC
  Administered 2016-10-15 – 2016-10-17 (×3): 25 mg via ORAL
  Filled 2016-10-14 (×3): qty 1

## 2016-10-14 MED ORDER — ONDANSETRON HCL 4 MG/2ML IJ SOLN
INTRAMUSCULAR | Status: AC
  Filled 2016-10-14: qty 2

## 2016-10-14 MED ORDER — ACETAMINOPHEN 650 MG RE SUPP: 650.0000 mg | Freq: Four times a day (QID) | RECTAL | Status: DC | PRN

## 2016-10-14 MED ORDER — STERILE WATER FOR IRRIGATION IR SOLN
Status: DC | PRN
Start: 2016-10-14 — End: 2016-10-14
  Administered 2016-10-14: 2000 mL

## 2016-10-14 MED ORDER — FENTANYL CITRATE (PF) 100 MCG/2ML IJ SOLN
50.0000 ug | INTRAMUSCULAR | Status: DC | PRN
  Administered 2016-10-14: 100 ug via INTRAVENOUS

## 2016-10-14 MED ORDER — HYDROMORPHONE HCL 1 MG/ML IJ SOLN: 0.2500 mg | INTRAMUSCULAR | Status: DC | PRN

## 2016-10-14 MED ORDER — BUPIVACAINE LIPOSOME 1.3 % IJ SUSP
INTRAMUSCULAR | Status: DC | PRN
  Administered 2016-10-14: 20 mL

## 2016-10-14 MED ORDER — DIPHENHYDRAMINE HCL 12.5 MG/5ML PO ELIX
12.5000 mg | ORAL_SOLUTION | ORAL | Status: DC | PRN
  Administered 2016-10-15 – 2016-10-16 (×3): 12.5 mg via ORAL
  Filled 2016-10-14 (×3): qty 5

## 2016-10-14 MED ORDER — MIDAZOLAM HCL 2 MG/2ML IJ SOLN
1.0000 mg | INTRAMUSCULAR | Status: DC | PRN
  Administered 2016-10-14: 2 mg via INTRAVENOUS
  Filled 2016-10-14: qty 2

## 2016-10-14 MED ORDER — FLEET ENEMA 7-19 GM/118ML RE ENEM: 1.0000 | ENEMA | Freq: Once | RECTAL | Status: DC | PRN

## 2016-10-14 MED ORDER — LOSARTAN POTASSIUM-HCTZ 100-25 MG PO TABS: 1.0000 | ORAL_TABLET | Freq: Every day | ORAL | Status: DC

## 2016-10-14 MED ORDER — METHYLPREDNISOLONE ACETATE 40 MG/ML IJ SUSP
INTRAMUSCULAR | Status: AC
  Filled 2016-10-14: qty 2

## 2016-10-14 MED ORDER — CEFAZOLIN SODIUM-DEXTROSE 2-4 GM/100ML-% IV SOLN
2.0000 g | INTRAVENOUS | Status: AC
  Administered 2016-10-14: 2 g via INTRAVENOUS

## 2016-10-14 MED ORDER — POLYETHYLENE GLYCOL 3350 17 G PO PACK
17.0000 g | PACK | Freq: Every day | ORAL | Status: DC | PRN
  Administered 2016-10-17: 17 g via ORAL
  Filled 2016-10-14: qty 1

## 2016-10-14 MED ORDER — PROPOFOL 500 MG/50ML IV EMUL
INTRAVENOUS | Status: DC | PRN
  Administered 2016-10-14: 100 ug/kg/min via INTRAVENOUS

## 2016-10-14 MED ORDER — GABAPENTIN 300 MG PO CAPS
ORAL_CAPSULE | ORAL | Status: AC
  Administered 2016-10-14: 300 mg via ORAL
  Filled 2016-10-14: qty 1

## 2016-10-14 MED ORDER — 0.9 % SODIUM CHLORIDE (POUR BTL) OPTIME
TOPICAL | Status: DC | PRN
Start: 2016-10-14 — End: 2016-10-14
  Administered 2016-10-14: 1000 mL

## 2016-10-14 MED ORDER — PREDNISOLONE ACETATE 1 % OP SUSP: 1.0000 [drp] | Freq: Four times a day (QID) | OPHTHALMIC | Status: DC | PRN

## 2016-10-14 MED ORDER — SODIUM CHLORIDE 0.9 % IJ SOLN
INTRAMUSCULAR | Status: DC | PRN
  Administered 2016-10-14: 30 mL

## 2016-10-14 MED ORDER — GABAPENTIN 600 MG PO TABS
300.0000 mg | ORAL_TABLET | Freq: Once | ORAL | Status: DC
  Filled 2016-10-14: qty 0.5

## 2016-10-14 MED ORDER — RIVAROXABAN 10 MG PO TABS
10.0000 mg | ORAL_TABLET | Freq: Every day | ORAL | Status: DC
  Administered 2016-10-15 – 2016-10-17 (×3): 10 mg via ORAL
  Filled 2016-10-14 (×3): qty 1

## 2016-10-14 MED ORDER — OXYCODONE HCL 5 MG/5ML PO SOLN
5.0000 mg | Freq: Once | ORAL | Status: DC | PRN
  Filled 2016-10-14: qty 5

## 2016-10-14 MED ORDER — BENZONATATE 100 MG PO CAPS: 100.0000 mg | ORAL_CAPSULE | Freq: Three times a day (TID) | ORAL | Status: DC | PRN

## 2016-10-14 MED ORDER — SODIUM CHLORIDE 0.9 % IV SOLN
INTRAVENOUS | Status: DC
  Administered 2016-10-14: 75 mL/h via INTRAVENOUS
  Administered 2016-10-15: 04:00:00 via INTRAVENOUS

## 2016-10-14 MED ORDER — BUPIVACAINE LIPOSOME 1.3 % IJ SUSP
20.0000 mL | Freq: Once | INTRAMUSCULAR | Status: DC
  Filled 2016-10-14: qty 20

## 2016-10-14 MED ORDER — ONDANSETRON HCL 4 MG/2ML IJ SOLN
INTRAMUSCULAR | Status: DC | PRN
  Administered 2016-10-14: 4 mg via INTRAVENOUS

## 2016-10-14 MED ORDER — OXYCODONE HCL 5 MG PO TABS: 5.0000 mg | ORAL_TABLET | Freq: Once | ORAL | Status: DC | PRN

## 2016-10-14 MED ORDER — BUPIVACAINE HCL (PF) 0.5 % IJ SOLN
INTRAMUSCULAR | Status: DC | PRN
  Administered 2016-10-14: 15 mg via INTRATHECAL

## 2016-10-14 MED ORDER — LACTATED RINGERS IV SOLN
INTRAVENOUS | Status: DC
  Administered 2016-10-14 (×3): via INTRAVENOUS

## 2016-10-14 MED ORDER — METOCLOPRAMIDE HCL 5 MG/ML IJ SOLN: 5.0000 mg | Freq: Three times a day (TID) | INTRAMUSCULAR | Status: DC | PRN

## 2016-10-14 MED ORDER — LOSARTAN POTASSIUM 50 MG PO TABS
100.0000 mg | ORAL_TABLET | Freq: Every day | ORAL | Status: DC
  Administered 2016-10-15 – 2016-10-17 (×3): 100 mg via ORAL
  Filled 2016-10-14 (×3): qty 2

## 2016-10-14 MED ORDER — FENTANYL CITRATE (PF) 100 MCG/2ML IJ SOLN
INTRAMUSCULAR | Status: AC
  Administered 2016-10-14: 100 ug via INTRAVENOUS
  Filled 2016-10-14: qty 2

## 2016-10-14 MED ORDER — PHENYLEPHRINE 40 MCG/ML (10ML) SYRINGE FOR IV PUSH (FOR BLOOD PRESSURE SUPPORT)
PREFILLED_SYRINGE | INTRAVENOUS | Status: AC
  Filled 2016-10-14: qty 10

## 2016-10-14 MED ORDER — TRANEXAMIC ACID 1000 MG/10ML IV SOLN
1000.0000 mg | Freq: Once | INTRAVENOUS | Status: AC
  Administered 2016-10-14: 1000 mg via INTRAVENOUS
  Filled 2016-10-14: qty 10
  Filled 2016-10-14: qty 1100

## 2016-10-14 MED ORDER — PHENOL 1.4 % MT LIQD: 1.0000 | OROMUCOSAL | Status: DC | PRN

## 2016-10-14 MED ORDER — ACETAMINOPHEN 10 MG/ML IV SOLN
1000.0000 mg | Freq: Once | INTRAVENOUS | Status: AC
  Administered 2016-10-14: 1000 mg via INTRAVENOUS

## 2016-10-14 MED ORDER — DEXAMETHASONE SODIUM PHOSPHATE 10 MG/ML IJ SOLN
INTRAMUSCULAR | Status: AC
  Filled 2016-10-14: qty 1

## 2016-10-14 MED ORDER — ONDANSETRON HCL 4 MG PO TABS: 4.0000 mg | ORAL_TABLET | Freq: Four times a day (QID) | ORAL | Status: DC | PRN

## 2016-10-14 MED ORDER — HYDROMORPHONE HCL 1 MG/ML IJ SOLN
INTRAMUSCULAR | Status: AC
  Filled 2016-10-14: qty 1

## 2016-10-14 MED ORDER — METHOCARBAMOL 1000 MG/10ML IJ SOLN
500.0000 mg | Freq: Four times a day (QID) | INTRAVENOUS | Status: DC | PRN
  Administered 2016-10-14: 500 mg via INTRAVENOUS
  Filled 2016-10-14: qty 550
  Filled 2016-10-14: qty 5

## 2016-10-14 MED ORDER — GABAPENTIN 300 MG PO CAPS
300.0000 mg | ORAL_CAPSULE | Freq: Once | ORAL | Status: AC
  Administered 2016-10-14: 300 mg via ORAL

## 2016-10-14 MED ORDER — PROPOFOL 10 MG/ML IV BOLUS
INTRAVENOUS | Status: AC
  Filled 2016-10-14: qty 40

## 2016-10-14 MED ORDER — ATORVASTATIN CALCIUM 20 MG PO TABS
20.0000 mg | ORAL_TABLET | Freq: Every day | ORAL | Status: DC
  Administered 2016-10-15 – 2016-10-17 (×3): 20 mg via ORAL
  Filled 2016-10-14 (×3): qty 1

## 2016-10-14 MED ORDER — ONDANSETRON HCL 4 MG/2ML IJ SOLN
4.0000 mg | Freq: Four times a day (QID) | INTRAMUSCULAR | Status: DC | PRN
  Administered 2016-10-15: 4 mg via INTRAVENOUS
  Filled 2016-10-14: qty 2

## 2016-10-14 MED ORDER — SODIUM CHLORIDE 0.9 % IJ SOLN
INTRAMUSCULAR | Status: AC
  Filled 2016-10-14: qty 50

## 2016-10-14 MED ORDER — TRAMADOL HCL 50 MG PO TABS
50.0000 mg | ORAL_TABLET | Freq: Four times a day (QID) | ORAL | Status: DC | PRN
  Filled 2016-10-14: qty 2

## 2016-10-14 MED ORDER — ACETAMINOPHEN 500 MG PO TABS
1000.0000 mg | ORAL_TABLET | Freq: Four times a day (QID) | ORAL | Status: AC
  Administered 2016-10-14 – 2016-10-15 (×4): 1000 mg via ORAL
  Filled 2016-10-14 (×4): qty 2

## 2016-10-14 MED ORDER — MIDAZOLAM HCL 2 MG/2ML IJ SOLN
INTRAMUSCULAR | Status: AC
  Administered 2016-10-14: 2 mg via INTRAVENOUS
  Filled 2016-10-14: qty 2

## 2016-10-14 MED ORDER — ONDANSETRON HCL 4 MG/2ML IJ SOLN: 4.0000 mg | Freq: Four times a day (QID) | INTRAMUSCULAR | Status: DC | PRN

## 2016-10-14 MED ORDER — METHOCARBAMOL 500 MG PO TABS
500.0000 mg | ORAL_TABLET | Freq: Four times a day (QID) | ORAL | Status: DC | PRN
  Administered 2016-10-14 – 2016-10-17 (×9): 500 mg via ORAL
  Filled 2016-10-14 (×9): qty 1

## 2016-10-14 MED ORDER — DEXAMETHASONE SODIUM PHOSPHATE 10 MG/ML IJ SOLN
10.0000 mg | Freq: Once | INTRAMUSCULAR | Status: AC
  Administered 2016-10-15: 10 mg via INTRAVENOUS
  Filled 2016-10-14: qty 1

## 2016-10-14 MED ORDER — METOCLOPRAMIDE HCL 5 MG PO TABS: 5.0000 mg | ORAL_TABLET | Freq: Three times a day (TID) | ORAL | Status: DC | PRN

## 2016-10-14 MED ORDER — CHLORHEXIDINE GLUCONATE 4 % EX LIQD: 60.0000 mL | Freq: Once | CUTANEOUS | Status: DC

## 2016-10-14 MED ORDER — MORPHINE SULFATE (PF) 2 MG/ML IV SOLN
1.0000 mg | INTRAVENOUS | Status: DC | PRN
  Administered 2016-10-14 – 2016-10-15 (×3): 1 mg via INTRAVENOUS
  Filled 2016-10-14 (×4): qty 1

## 2016-10-14 MED ORDER — DEXAMETHASONE SODIUM PHOSPHATE 10 MG/ML IJ SOLN
10.0000 mg | Freq: Once | INTRAMUSCULAR | Status: AC
  Administered 2016-10-14: 10 mg via INTRAVENOUS

## 2016-10-14 MED ORDER — OXYCODONE HCL 5 MG PO TABS
5.0000 mg | ORAL_TABLET | ORAL | Status: DC | PRN
  Administered 2016-10-14: 5 mg via ORAL
  Administered 2016-10-14 – 2016-10-15 (×4): 10 mg via ORAL
  Administered 2016-10-15: 5 mg via ORAL
  Administered 2016-10-15 (×2): 10 mg via ORAL
  Administered 2016-10-15: 5 mg via ORAL
  Administered 2016-10-15 – 2016-10-16 (×4): 10 mg via ORAL
  Administered 2016-10-16: 5 mg via ORAL
  Administered 2016-10-16: 10 mg via ORAL
  Filled 2016-10-14 (×2): qty 2
  Filled 2016-10-14: qty 1
  Filled 2016-10-14 (×5): qty 2
  Filled 2016-10-14: qty 1
  Filled 2016-10-14 (×3): qty 2
  Filled 2016-10-14: qty 1
  Filled 2016-10-14 (×2): qty 2

## 2016-10-14 MED ORDER — ACETAMINOPHEN 10 MG/ML IV SOLN
INTRAVENOUS | Status: AC
  Filled 2016-10-14: qty 100

## 2016-10-14 MED ORDER — PROPOFOL 10 MG/ML IV BOLUS
INTRAVENOUS | Status: AC
  Filled 2016-10-14: qty 20

## 2016-10-14 MED ORDER — MENTHOL 3 MG MT LOZG: 1.0000 | LOZENGE | OROMUCOSAL | Status: DC | PRN

## 2016-10-14 MED ORDER — INSULIN ASPART 100 UNIT/ML ~~LOC~~ SOLN
0.0000 [IU] | Freq: Three times a day (TID) | SUBCUTANEOUS | Status: DC
  Administered 2016-10-15: 2 [IU] via SUBCUTANEOUS
  Administered 2016-10-15: 3 [IU] via SUBCUTANEOUS

## 2016-10-14 MED ORDER — BUPIVACAINE HCL (PF) 0.5 % IJ SOLN
INTRAMUSCULAR | Status: AC
  Filled 2016-10-14: qty 30

## 2016-10-14 MED ORDER — BISACODYL 10 MG RE SUPP: 10.0000 mg | Freq: Every day | RECTAL | Status: DC | PRN

## 2016-10-14 MED ORDER — SODIUM CHLORIDE 0.9 % IR SOLN
Status: DC | PRN
  Administered 2016-10-14: 1000 mL

## 2016-10-14 MED ORDER — DOCUSATE SODIUM 100 MG PO CAPS
100.0000 mg | ORAL_CAPSULE | Freq: Two times a day (BID) | ORAL | Status: DC
  Administered 2016-10-14 – 2016-10-17 (×7): 100 mg via ORAL
  Filled 2016-10-14 (×8): qty 1

## 2016-10-14 MED ORDER — PHENYLEPHRINE 40 MCG/ML (10ML) SYRINGE FOR IV PUSH (FOR BLOOD PRESSURE SUPPORT)
PREFILLED_SYRINGE | INTRAVENOUS | Status: DC | PRN
  Administered 2016-10-14 (×7): 80 ug via INTRAVENOUS

## 2016-10-14 MED ORDER — CEFAZOLIN SODIUM-DEXTROSE 2-4 GM/100ML-% IV SOLN
2.0000 g | Freq: Four times a day (QID) | INTRAVENOUS | Status: AC
  Administered 2016-10-14 (×2): 2 g via INTRAVENOUS
  Filled 2016-10-14 (×3): qty 100

## 2016-10-14 MED ORDER — METHYLPREDNISOLONE ACETATE 80 MG/ML IJ SUSP
INTRAMUSCULAR | Status: DC | PRN
  Administered 2016-10-14: 80 mg

## 2016-10-14 MED ORDER — TRANEXAMIC ACID 1000 MG/10ML IV SOLN
1000.0000 mg | INTRAVENOUS | Status: AC
  Administered 2016-10-14: 1000 mg via INTRAVENOUS
  Filled 2016-10-14: qty 1100

## 2016-10-14 MED ORDER — CEFAZOLIN SODIUM-DEXTROSE 2-4 GM/100ML-% IV SOLN
INTRAVENOUS | Status: AC
  Filled 2016-10-14: qty 100

## 2016-10-14 SURGICAL SUPPLY — 49 items
BAG ZIPLOCK 12X15 (MISCELLANEOUS) ×3 IMPLANT
BANDAGE ACE 6X5 VEL STRL LF (GAUZE/BANDAGES/DRESSINGS) ×3 IMPLANT
BLADE SAG 18X100X1.27 (BLADE) ×3 IMPLANT
BLADE SAW SGTL 11.0X1.19X90.0M (BLADE) ×3 IMPLANT
BNDG COHESIVE 6X5 TAN STRL LF (GAUZE/BANDAGES/DRESSINGS) ×3 IMPLANT
BOWL SMART MIX CTS (DISPOSABLE) ×3 IMPLANT
CAPT KNEE TOTAL 3 ATTUNE ×3 IMPLANT
CEMENT HV SMART SET (Cement) ×6 IMPLANT
CUFF TOURN SGL QUICK 34 (TOURNIQUET CUFF) ×1
CUFF TRNQT CYL 34X4X40X1 (TOURNIQUET CUFF) ×2 IMPLANT
DECANTER SPIKE VIAL GLASS SM (MISCELLANEOUS) ×3 IMPLANT
DRAPE U-SHAPE 47X51 STRL (DRAPES) ×3 IMPLANT
DRSG ADAPTIC 3X8 NADH LF (GAUZE/BANDAGES/DRESSINGS) ×3 IMPLANT
DRSG PAD ABDOMINAL 8X10 ST (GAUZE/BANDAGES/DRESSINGS) ×3 IMPLANT
DURAPREP 26ML APPLICATOR (WOUND CARE) ×3 IMPLANT
ELECT PENCIL ROCKER SW 15FT (MISCELLANEOUS) ×3 IMPLANT
EVACUATOR 1/8 PVC DRAIN (DRAIN) ×3 IMPLANT
GAUZE SPONGE 4X4 12PLY STRL (GAUZE/BANDAGES/DRESSINGS) ×3 IMPLANT
GLOVE BIO SURGEON STRL SZ8 (GLOVE) ×3 IMPLANT
GLOVE BIOGEL PI IND STRL 6.5 (GLOVE) ×2 IMPLANT
GLOVE BIOGEL PI IND STRL 7.0 (GLOVE) ×4 IMPLANT
GLOVE BIOGEL PI IND STRL 7.5 (GLOVE) ×6 IMPLANT
GLOVE BIOGEL PI IND STRL 8 (GLOVE) ×2 IMPLANT
GLOVE BIOGEL PI INDICATOR 6.5 (GLOVE) ×1
GLOVE BIOGEL PI INDICATOR 7.0 (GLOVE) ×2
GLOVE BIOGEL PI INDICATOR 7.5 (GLOVE) ×3
GLOVE BIOGEL PI INDICATOR 8 (GLOVE) ×1
GLOVE SURG SS PI 6.5 STRL IVOR (GLOVE) ×3 IMPLANT
GLOVE SURG SS PI 7.5 STRL IVOR (GLOVE) ×3 IMPLANT
GOWN SPEC L3 XXLG W/TWL (GOWN DISPOSABLE) ×3 IMPLANT
GOWN STRL REUS W/TWL LRG LVL3 (GOWN DISPOSABLE) ×6 IMPLANT
GOWN STRL REUS W/TWL XL LVL3 (GOWN DISPOSABLE) ×3 IMPLANT
HANDPIECE INTERPULSE COAX TIP (DISPOSABLE) ×1
IMMOBILIZER KNEE 20 (SOFTGOODS) ×3
IMMOBILIZER KNEE 20 THIGH 36 (SOFTGOODS) ×2 IMPLANT
MANIFOLD NEPTUNE II (INSTRUMENTS) ×3 IMPLANT
PACK TOTAL KNEE CUSTOM (KITS) ×3 IMPLANT
PADDING CAST COTTON 6X4 STRL (CAST SUPPLIES) ×9 IMPLANT
POSITIONER SURGICAL ARM (MISCELLANEOUS) ×3 IMPLANT
SET HNDPC FAN SPRY TIP SCT (DISPOSABLE) ×2 IMPLANT
STRIP CLOSURE SKIN 1/2X4 (GAUZE/BANDAGES/DRESSINGS) ×6 IMPLANT
SUT MNCRL AB 4-0 PS2 18 (SUTURE) ×3 IMPLANT
SUT STRATAFIX 0 PDS 27 VIOLET (SUTURE) ×3
SUT VIC AB 2-0 CT1 27 (SUTURE) ×3
SUT VIC AB 2-0 CT1 TAPERPNT 27 (SUTURE) ×6 IMPLANT
SUTURE STRATFX 0 PDS 27 VIOLET (SUTURE) ×2 IMPLANT
TRAY FOLEY CATH SILVER 14FR (SET/KITS/TRAYS/PACK) ×3 IMPLANT
WRAP KNEE MAXI GEL POST OP (GAUZE/BANDAGES/DRESSINGS) ×3 IMPLANT
YANKAUER SUCT BULB TIP 10FT TU (MISCELLANEOUS) ×3 IMPLANT

## 2016-10-14 NOTE — Anesthesia Postprocedure Evaluation (Signed)
Anesthesia Post Note  Patient: Amy Reyes  Procedure(s) Performed: Procedure(s) (LRB): RIGHT TOTAL KNEE ARTHROPLASTY (Right) CORTISONE LEFT KNEE INJECTION (Left)  Patient location during evaluation: PACU Anesthesia Type: MAC and Spinal Level of consciousness: oriented and awake and alert Pain management: pain level controlled Vital Signs Assessment: post-procedure vital signs reviewed and stable Respiratory status: spontaneous breathing, respiratory function stable and patient connected to nasal cannula oxygen Cardiovascular status: blood pressure returned to baseline and stable Postop Assessment: no headache and no backache Anesthetic complications: no       Last Vitals:  Vitals:   10/14/16 1531 10/14/16 1621  BP: 138/75 (!) 141/55  Pulse: 68 84  Resp: 15 16  Temp: 36.4 C 36.4 C    Last Pain:  Vitals:   10/14/16 1626  TempSrc:   PainSc: [name]

## 2016-10-14 NOTE — Interval H&P Note (Signed)
History and Physical Interval Note:  10/14/2016 8:30 AM  Amy Reyes  has presented today for surgery, with the diagnosis of Osteoarthritis right knee  The various methods of treatment have been discussed with the patient and family. After consideration of risks, benefits and other options for treatment, the patient has consented to  Procedure(s): RIGHT TOTAL KNEE ARTHROPLASTY (Right) as a surgical intervention .  The patient's history has been reviewed, patient examined, no change in status, stable for surgery.  I have reviewed the patient's chart and labs.  Questions were answered to the patient's satisfaction.     Gearlean Alf

## 2016-10-14 NOTE — Progress Notes (Signed)
Assisted Dr. Hodierne with right, ultrasound guided, adductor canal block. Side rails up, monitors on throughout procedure. See vital signs in flow sheet. Tolerated Procedure well.  

## 2016-10-14 NOTE — H&P (View-Only) (Signed)
Nonie Hoyer DOB: 19-May-1951 Single / Language: Cleophus Molt / Race: Black or African American Female Date of Admission:  10/15/2106 CC:  Right Knee Pain History of Present Illness  The patient is a 66 year old female who comes in for a preoperative History and Physical. The patient is scheduled for a right total knee arthroplasty to be performed by Dr. Dione Plover. Aluisio, MD at [facility] on 10-14-2016. The patient is a 66 year old female who presented for follow up of their knee. The patient is being followed for their bilateral knee pain and osteoarthritis. They are now months out from cortisone injections (and from Euflexxa series). Symptoms reported include: pain, swelling, aching, stiffness, grinding and difficulty ambulating. The patient feels that they are doing poorly and report their pain level to be severe. The following medication has been used for pain control: Tylenol (does not really help. She states that her PCP took her off Diclofenac due to decreased kidney funtion on recent labs. She is still using Voltaren gel occasionally). The patient has not gotten any relief of their symptoms with Cortisone injections or viscosupplementation. She states that she is very discouraged since none of the injections have worked. She states that she previously has a series of visco several years ago while she was in [place].  Unfortunately, her right knee is getting progressively worse. She has also had a cortisone in the right knee which provided short-term benefit, but she said that she cannot tolerate, however, her right knee is doing anymore and it is really limiting her. She says it is at a stage where she is ready to get it fixed. They have been treated conservatively in the past for the above stated problem and despite conservative measures, they continue to have progressive pain and severe functional limitations and dysfunction. They have failed non-operative management including home  exercise, medications, and injections. It is felt that they would benefit from undergoing total joint replacement. Risks and benefits of the procedure have been discussed with the patient and they elect to proceed with surgery. There are no active contraindications to surgery such as ongoing infection or rapidly progressive neurological disease.  Problem List/Past Medical Chronic pain of left knee (M25.562)  Primary osteoarthritis of both knees (M17.0)  High blood pressure  Hypercholesterolemia  Kidney Stone  Gastroesophageal Reflux Disease  Non-Insulin Dependent Diabetes Mellitus  Anemia of Chronic Renal Failure  Chronic Kidney Disease Stage III  Microalbuminuria  Obesity    Allergies No Known Drug Allergies  Family History  Cancer  Father. Depression  Brother. Diabetes Mellitus  Maternal Grandfather. Drug / Alcohol Addiction  child First Degree Relatives  reported Hypertension  Brother, Father, Mother, Sister. Rheumatoid Arthritis  Sister. Severe allergy  child  Social History Children  2 Current work status  disabled Exercise  Exercises rarely; does other Former drinker  07/19/2015: In the past drank Living situation  live alone Marital status  single No history of drug/alcohol rehab  Not under pain contract  Number of flights of stairs before winded  greater than 5 Tobacco / smoke exposure  07/19/2015: no Tobacco use  Former smoker. 07/19/2015: smoke(d) 1 pack(s) per day  Medication History  Aleve Active. Tylenol (Oral) Specific strength unknown - Active. Atorvastatin Calcium (20MG  Tablet, Oral) Active. Losartan Potassium (100MG  Tablet, Oral) Active. MetFORMIN HCl (500MG  Tablet, Oral) Active. MedroxyPROGESTERone Acetate (5MG  Tablet, Oral) Active. Estradiol (0.5MG  Tablet, Oral) Active. Multivitamin (Oral) Active. Fish Oil (Oral) Specific strength unknown - Active.   Past  Surgical History Cesarean Delivery  1  time Gallbladder Surgery  laporoscopic Mammoplasty; Reduction  bilateral   Review of Systems  General Not Present- Chills, Fatigue, Fever, Memory Loss, Night Sweats, Weight Gain and Weight Loss. Skin Not Present- Eczema, Hives, Itching, Lesions and Rash. HEENT Not Present- Dentures, Double Vision, Headache, Hearing Loss, Tinnitus and Visual Loss. Respiratory Not Present- Allergies, Chronic Cough, Coughing up blood, Shortness of breath at rest and Shortness of breath with exertion. Cardiovascular Not Present- Chest Pain, Difficulty Breathing Lying Down, Murmur, Palpitations, Racing/skipping heartbeats and Swelling. Gastrointestinal Not Present- Abdominal Pain, Bloody Stool, Constipation, Diarrhea, Difficulty Swallowing, Heartburn, Jaundice, Loss of appetitie, Nausea and Vomiting. Female Genitourinary Not Present- Blood in Urine, Discharge, Flank Pain, Incontinence, Painful Urination, Urgency, Urinary frequency, Urinary Retention, Urinating at Night and Weak urinary stream. Musculoskeletal Present- Joint Pain (right knee). Not Present- Back Pain, Joint Swelling, Morning Stiffness, Muscle Pain, Muscle Weakness and Spasms. Neurological Not Present- Blackout spells, Difficulty with balance, Dizziness, Paralysis, Tremor and Weakness. Psychiatric Not Present- Insomnia.  Vitals  Weight: 210 lb Height: 66.5in Weight was reported by patient. Height was reported by patient. Body Surface Area: 2.05 m Body Mass Index: 33.39 kg/m  Pulse: 68 (Regular)  BP: 126/64 (Sitting, Right Arm, Standard)   Physical Exam  General Mental Status -Alert, cooperative and good historian. General Appearance-pleasant, Not in acute distress. Orientation-Oriented X3. Build & Nutrition-Well nourished and Well developed.  Head and Neck Head-normocephalic, atraumatic . Neck Global Assessment - supple, no bruit auscultated on the right, no bruit auscultated on the left.  Eye Vision-Wears  contact lenses(left eye). Pupil - Bilateral-Regular and Round. Motion - Bilateral-EOMI.  Chest and Lung Exam Auscultation Breath sounds - clear at anterior chest wall and clear at posterior chest wall. Adventitious sounds - No Adventitious sounds.  Cardiovascular Auscultation Rhythm - Regular rate and rhythm. Heart Sounds - S1 WNL and S2 WNL. Murmurs & Other Heart Sounds - Auscultation of the heart reveals - No Murmurs.  Abdomen Palpation/Percussion Tenderness - Abdomen is non-tender to palpation. Rigidity (guarding) - Abdomen is soft. Auscultation Auscultation of the abdomen reveals - Bowel sounds normal.  Female Genitourinary Note: Not done, not pertinent to present illness   Musculoskeletal Note: On exam, she is alert and oriented, in no apparent distress. Her hips show normal range of motion with no discomfort. Her right knee shows no effusion. Her range of motion of the right knee is about 5 to 115. There is moderate crepitus on range of motion with tenderness medial greater than lateral and no instability. Her left knee shows no effusion. Range of about 5 to 130. Moderate crepitus on range of motion. Tender medial greater than lateral with no instability noted. Pulse, sensation and motor are intact. She has significantly antalgic gait pattern.  Her radiographs, AP both knees and lateral reviewed and she has got significant medial and patellofemoral arthritis in both knees with bone-on-bone changes and some slight tibial subluxation.   Assessment & Plan  Primary osteoarthritis of both knees (M17.0)  Note:Surgical Plans: Right Total Knee Replacement  Disposition: Home with family member  PCP: Dr. Domenick Gong - Patient has been seen preoperatively and felt to be stable for surgery.  IV TXA  Anesthesia Issues: None  Patient was instructed on what medications to stop prior to surgery.  Signed electronically by Joelene Millin, III PA-C

## 2016-10-14 NOTE — Transfer of Care (Signed)
Immediate Anesthesia Transfer of Care Note  Patient: Amy Reyes  Procedure(s) Performed: Procedure(s) with comments: RIGHT TOTAL KNEE ARTHROPLASTY (Right) - Adductor Block CORTISONE LEFT KNEE INJECTION (Left)  Patient Location: PACU  Anesthesia Type:Spinal  Level of Consciousness:  sedated, patient cooperative and responds to stimulation  Airway & Oxygen Therapy:Patient Spontanous Breathing and Patient connected to face mask oxgen  Post-op Assessment:  Report given to PACU RN and Post -op Vital signs reviewed and stable  Post vital signs:  Reviewed and stable  Last Vitals:  Vitals:   10/14/16 0917 10/14/16 0918  BP:    Pulse: 64 73  Resp: (!) 8 12  Temp:      Complications: No apparent anesthesia complications

## 2016-10-14 NOTE — Anesthesia Procedure Notes (Signed)
Anesthesia Regional Block: Adductor canal block   Pre-Anesthetic Checklist: ,, timeout performed, Correct Patient, Correct Site, Correct Laterality, Correct Procedure, Correct Position, site marked, Risks and benefits discussed,  Surgical consent,  Pre-op evaluation,  At surgeon's request and post-op pain management  Laterality: Right  Prep: chloraprep       Needles:  Injection technique: Single-shot  Needle Type: Echogenic Needle     Needle Length: 9cm  Needle Gauge: 21     Additional Needles:   Procedures: ultrasound guided,,,,,,,,  Narrative:  Start time: 10/14/2016 9:02 AM End time: 10/14/2016 9:10 AM Injection made incrementally with aspirations every 5 mL.  Performed by: Personally  Anesthesiologist: Seraj Dunnam  Additional Notes: Pt tolerated the procedure well.

## 2016-10-14 NOTE — Anesthesia Preprocedure Evaluation (Signed)
Anesthesia Evaluation  Patient identified by MRN, date of birth, ID band Patient awake    Reviewed: Allergy & Precautions, H&P , NPO status , Patient's Chart, lab work & pertinent test results  Airway Mallampati: II   Neck ROM: full    Dental   Pulmonary former smoker,    breath sounds clear to auscultation       Cardiovascular hypertension,  Rhythm:regular Rate:Normal     Neuro/Psych    GI/Hepatic   Endo/Other  diabetes, Type 2obese  Renal/GU stones     Musculoskeletal  (+) Arthritis ,   Abdominal   Peds  Hematology   Anesthesia Other Findings   Reproductive/Obstetrics                             Anesthesia Physical Anesthesia Plan  ASA: II  Anesthesia Plan: MAC and Spinal   Post-op Pain Management:  Regional for Post-op pain   Induction: Intravenous  Airway Management Planned: Simple Face Mask  Additional Equipment:   Intra-op Plan:   Post-operative Plan:   Informed Consent: I have reviewed the patients History and Physical, chart, labs and discussed the procedure including the risks, benefits and alternatives for the proposed anesthesia with the patient or authorized representative who has indicated his/her understanding and acceptance.     Plan Discussed with: CRNA, Anesthesiologist and Surgeon  Anesthesia Plan Comments:         Anesthesia Quick Evaluation

## 2016-10-14 NOTE — Op Note (Signed)
OPERATIVE REPORT-TOTAL KNEE ARTHROPLASTY   Pre-operative diagnosis- Osteoarthritis  Bilateral knee(s)  Post-operative diagnosis- Osteoarthritis Bilateral knee(s)  Procedure-  Right  Total Knee Arthroplasty (Depuy Attune)   Left knee cortisone injection  Surgeon- Dione Plover. Leylani Duley, MD  Assistant- Ardeen Jourdain, PA-C   Anesthesia-  Adductor canal block and spinal  EBL-* No blood loss amount entered *   Drains Hemovac  Tourniquet time- 34 minutes @ 818 mm Hg  Complications- None  Condition-PACU - hemodynamically stable.   Brief Clinical Note  Amy Reyes is a 66 y.o. year old female with end stage OA of her right knee with progressively worsening pain and dysfunction. She has constant pain, with activity and at rest and significant functional deficits with difficulties even with ADLs. She has had extensive non-op management including analgesics, injections of cortisone and viscosupplements, and home exercise program, but remains in significant pain with significant dysfunction.Radiographs show bone on bone arthritis medial and patellofemoral. She presents now for right Total Knee Arthroplasty.  She also has advanced osteoarthritis of her left knee and requests cortisone injection.  Procedure in detail---   The patient is brought into the operating room and positioned supine on the operating table. After successful administration of  Adductor canal block and spinal,   a tourniquet is placed high on the  Right thigh(s) and the lower extremity is prepped and draped in the usual sterile fashion. Time out is performed by the operating team and then the  Right lower extremity is wrapped in Esmarch, knee flexed and the tourniquet inflated to 300 mmHg.       A midline incision is made with a ten blade through the subcutaneous tissue to the level of the extensor mechanism. A fresh blade is used to make a medial parapatellar arthrotomy. Soft tissue over the proximal medial tibia is  subperiosteally elevated to the joint line with a knife and into the semimembranosus bursa with a Cobb elevator. Soft tissue over the proximal lateral tibia is elevated with attention being paid to avoiding the patellar tendon on the tibial tubercle. The patella is everted, knee flexed 90 degrees and the ACL and PCL are removed. Findings are bone on bone medial and patellofemoral with large global osteophytes.        The drill is used to create a starting hole in the distal femur and the canal is thoroughly irrigated with sterile saline to remove the fatty contents. The 5 degree Right  valgus alignment guide is placed into the femoral canal and the distal femoral cutting block is pinned to remove 10 mm off the distal femur. Resection is made with an oscillating saw.      The tibia is subluxed forward and the menisci are removed. The extramedullary alignment guide is placed referencing proximally at the medial aspect of the tibial tubercle and distally along the second metatarsal axis and tibial crest. The block is pinned to remove 16mm off the more deficient medial  side. Resection is made with an oscillating saw. Size 5is the most appropriate size for the tibia and the proximal tibia is prepared with the modular drill and keel punch for that size.      The femoral sizing guide is placed and size 5 is most appropriate. Rotation is marked off the epicondylar axis and confirmed by creating a rectangular flexion gap at 90 degrees. The size 5 cutting block is pinned in this rotation and the anterior, posterior and chamfer cuts are made with the oscillating saw. The  intercondylar block is then placed and that cut is made.      Trial size 5 tibial component, trial size 5 posterior stabilized femur and a 10  mm posterior stabilized rotating platform insert trial is placed. Full extension is achieved with excellent varus/valgus and anterior/posterior balance throughout full range of motion. The patella is everted and  thickness measured to be 22  mm. Free hand resection is taken to 12 mm, a 35 template is placed, lug holes are drilled, trial patella is placed, and it tracks normally. Osteophytes are removed off the posterior femur with the trial in place. All trials are removed and the cut bone surfaces prepared with pulsatile lavage. Cement is mixed and once ready for implantation, the size 5 tibial implant, size  5 posterior stabilized femoral component, and the size 35 patella are cemented in place and the patella is held with the clamp. The trial insert is placed and the knee held in full extension. The Exparel (20 ml mixed with 30 ml saline) is injected into the extensor mechanism, posterior capsule, medial and lateral gutters and subcutaneous tissues.  All extruded cement is removed and once the cement is hard the permanent 10 mm posterior stabilized rotating platform insert is placed into the tibial tray.      The wound is copiously irrigated with saline solution and the extensor mechanism closed over a hemovac drain with #1 V-loc suture. The tourniquet is released for a total tourniquet time of 34  minutes. Flexion against gravity is 140 degrees and the patella tracks normally. Subcutaneous tissue is closed with 2.0 vicryl and subcuticular with running 4.0 Monocryl. The incision is cleaned and dried and steri-strips and a bulky sterile dressing are applied. The limb is placed into a knee immobilizer.      I then prepped the left knee with Betadine and injected the joint with 80 mg (2 ml) of Depomedrol without problems.The patient is then awakened and transported to recovery in stable condition.      Please note that a surgical assistant was a medical necessity for this procedure in order to perform it in a safe and expeditious manner. Surgical assistant was necessary to retract the ligaments and vital neurovascular structures to prevent injury to them and also necessary for proper positioning of the limb to allow for  anatomic placement of the prosthesis.   Dione Plover Kanaan Kagawa, MD    10/14/2016, 10:28 AM

## 2016-10-14 NOTE — Anesthesia Procedure Notes (Signed)
Spinal  Patient location during procedure: OR Start time: 10/14/2016 9:27 AM End time: 10/14/2016 9:29 AM Staffing Resident/CRNA: Harle Stanford R Performed: resident/CRNA  Preanesthetic Checklist Completed: patient identified, site marked, surgical consent, pre-op evaluation, timeout performed, IV checked, risks and benefits discussed and monitors and equipment checked Spinal Block Patient position: sitting Prep: Betadine Patient monitoring: heart rate, cardiac monitor, continuous pulse ox and blood pressure Approach: midline Location: L3-4 Injection technique: single-shot Needle Needle type: Pencan  Needle gauge: 24 G Needle length: 10 cm Needle insertion depth: 7 cm Assessment Sensory level: T6 Additional Notes Timeout performed. SAB kit date checked. SAB without difficulty

## 2016-10-14 NOTE — Progress Notes (Signed)
PT Cancellation Note  Patient Details Name: Amy Reyes MRN: [id number] DOB: 06-May-1951   Cancelled Treatment:    Reason Eval/Treat Not Completed: Other (comment) (pt wants to stay in [facility], and eating.)   Claretha Cooper 10/14/2016, 4:47 PM

## 2016-10-15 LAB — BASIC METABOLIC PANEL
ANION GAP: 7 (ref 5–15)
BUN: 16 mg/dL (ref 6–20)
CO2: 26 mmol/L (ref 22–32)
Calcium: 8.8 mg/dL — ABNORMAL LOW (ref 8.9–10.3)
Chloride: 105 mmol/L (ref 101–111)
Creatinine, Ser: 1.17 mg/dL — ABNORMAL HIGH (ref 0.44–1.00)
GFR, EST AFRICAN AMERICAN: 55 mL/min — AB (ref >60)
GFR, EST NON AFRICAN AMERICAN: 48 mL/min — AB (ref >60)
GLUCOSE: 199 mg/dL — AB (ref 65–99)
POTASSIUM: 4.3 mmol/L (ref 3.5–5.1)
Sodium: 138 mmol/L (ref 135–145)

## 2016-10-15 LAB — CBC
HEMATOCRIT: 27.8 % — AB (ref 36.0–46.0)
Hemoglobin: 9.2 g/dL — ABNORMAL LOW (ref 12.0–15.0)
MCH: 27.5 pg (ref 26.0–34.0)
MCHC: 33.1 g/dL (ref 30.0–36.0)
MCV: 83 fL (ref 78.0–100.0)
Platelets: 236 10*3/uL (ref 150–400)
RBC: 3.35 MIL/uL — AB (ref 3.87–5.11)
RDW: 14.7 % (ref 11.5–15.5)
WBC: 13.7 10*3/uL — ABNORMAL HIGH (ref 4.0–10.5)

## 2016-10-15 LAB — GLUCOSE, CAPILLARY
GLUCOSE-CAPILLARY: 162 mg/dL — AB (ref 65–99)
Glucose-Capillary: 137 mg/dL — ABNORMAL HIGH (ref 65–99)
Glucose-Capillary: 100 mg/dL — ABNORMAL HIGH (ref 65–99)
Glucose-Capillary: 204 mg/dL — ABNORMAL HIGH (ref 65–99)

## 2016-10-15 MED ORDER — RIVAROXABAN 10 MG PO TABS: 10.0000 mg | ORAL_TABLET | Freq: Every day | ORAL | 0 refills | Status: DC

## 2016-10-15 MED ORDER — OXYCODONE HCL 5 MG PO TABS: 5.0000 mg | ORAL_TABLET | ORAL | 0 refills | Status: DC | PRN

## 2016-10-15 MED ORDER — METHOCARBAMOL 500 MG PO TABS: 500.0000 mg | ORAL_TABLET | Freq: Four times a day (QID) | ORAL | 0 refills | Status: DC | PRN

## 2016-10-15 NOTE — Progress Notes (Signed)
qPhysical Therapy Treatment Patient Details Name: Amy Reyes MRN: [id number] DOB: 02-Dec-1950 Today's Date: 10/15/2016    History of Present Illness 66 yo female s/p R TKA 10/14/16. Hx of DM, obesity    PT Comments    Progressing with mobility.    Follow Up Recommendations  Home health PT;Supervision/Assistance - 24 hour     Equipment Recommendations  Rolling walker with 5" wheels    Recommendations for Other Services OT consult     Precautions / Restrictions Precautions Precautions: Fall;Knee Required Braces or Orthoses: Knee Immobilizer - Left Knee Immobilizer - Right: Discontinue once straight leg raise with < 10 degree lag Restrictions Weight Bearing Restrictions: No RLE Weight Bearing: Weight bearing as tolerated Other Position/Activity Restrictions: WBAT RLE     Mobility  Bed Mobility Overal bed mobility: Needs Assistance Bed Mobility: Sit to Supine      Sit to supine: Min assist   General bed mobility comments: Assist for R LE.   Transfers Overall transfer level: Needs assistance Equipment used: Rolling walker (2 wheeled) Transfers: Sit to/from Stand Sit to Stand: Min guard         General transfer comment: close guard for safety. VCs safety, hand/LE placement  Ambulation/Gait Ambulation/Gait assistance: Min guard Ambulation Distance (Feet): 65 Feet Assistive device: Rolling walker (2 wheeled) Gait Pattern/deviations: Step-to pattern;Antalgic     General Gait Details: close guard for safety. Pt c/o fatigue, lightheadedness. 2 brief standing rest breaks taken   Stairs            Wheelchair Mobility    Modified Rankin (Stroke Patients Only)       Balance Overall balance assessment: Needs assistance Sitting-balance support: Feet supported Sitting balance-Leahy Scale: Good     Standing balance support: Bilateral upper extremity supported Standing balance-Leahy Scale: Fair                              Cognition  Arousal/Alertness: Awake/alert (drowsy) Behavior During Therapy: WFL for tasks assessed/performed Overall Cognitive Status: Within Functional Limits for tasks assessed                                 General Comments: Some drowsiness at start and end of session, Pt more alert during functional mobility and adl tasks       Exercises   General Comments        Pertinent Vitals/Pain Pain Assessment: 0-10 Pain Score: 5  Faces Pain Scale: Hurts even more Pain Location: R knee Pain Descriptors / Indicators: Sore Pain Intervention(s): Limited activity within patient's tolerance;Repositioned    Home Living Family/patient expects to be discharged to:: Private residence Living Arrangements: Children (daughter in town until Friday) Available Help at Discharge: Family Type of Home: Apartment Home Access: Level entry   Home Layout: One level Home Equipment: None      Prior Function Level of Independence: Independent          PT Goals (current goals can now be found in the care plan section) Acute Rehab PT Goals Patient Stated Goal: regain independence PT Goal Formulation: With patient/family Time For Goal Achievement: 10/29/16 Potential to Achieve Goals: Good Progress towards PT goals: Progressing toward goals    Frequency    7X/week      PT Plan Current plan remains appropriate    Co-evaluation  End of Session Equipment Utilized During Treatment: Gait belt;Right knee immobilizer Activity Tolerance: Patient limited by fatigue;Patient limited by pain Patient left: in bed;with call bell/phone within reach;with bed alarm set   PT Visit Diagnosis: Muscle weakness (generalized) (M62.81);Difficulty in walking, not elsewhere classified (R26.2)     Time: 5183-4373 PT Time Calculation (min) (ACUTE ONLY): 20 min  Charges:  $Gait Training: 8-22 mins                    G Codes:          Weston Anna, MPT Pager: [phone]

## 2016-10-15 NOTE — Evaluation (Signed)
Occupational Therapy Evaluation Patient Details Name: Amy Reyes MRN: [id number] DOB: 07-Jun-1951 Today's Date: 10/15/2016    History of Present Illness R TKA   Clinical Impression   Pt is a 66 y/o F who presents with the above. Pt with increased drowsiness during session, requiring increased time to complete functional mobility tasks. Pt will benefit from continued acute OT services to increase independence and safety with completion of daily ADLs and functional mobility. Goals have been set for [facility].     Follow Up Recommendations  Supervision/Assistance - 24 hour    Equipment Recommendations  To further assess          Precautions / Restrictions Precautions Precautions: Fall;Knee Required Braces or Orthoses: Knee Immobilizer - Right Knee Immobilizer - Right: Discontinue once straight leg raise with < 10 degree lag Restrictions Weight Bearing Restrictions: No Other Position/Activity Restrictions: WBAT RLE       Mobility Bed Mobility Overal bed mobility: Needs Assistance Bed Mobility: Supine to Sit     Supine to sit: Min assist     General bed mobility comments: For guiding RLE   Transfers Overall transfer level: Needs assistance Equipment used: Rolling walker (2 wheeled) Transfers: Sit to/from [facility] Sit to Stand: Min assist;From elevated surface Stand pivot transfers: Min assist       General transfer comment: Verbal cues for hand placement and sequencing     Balance Overall balance assessment: Needs assistance Sitting-balance support: Feet supported Sitting balance-Leahy Scale: Fair     Standing balance support: Bilateral upper extremity supported Standing balance-Leahy Scale: Poor                             ADL either performed or assessed with clinical judgement   ADL Overall ADL's : Needs assistance/impaired Eating/Feeding: Independent   Grooming: Wash/dry face;Supervision/safety;Sitting   Upper  Body Bathing: Set up;Sitting   Lower Body Bathing: Minimal assistance;Sit to/from stand   Upper Body Dressing : Set up;Sitting   Lower Body Dressing: Moderate assistance;Sit to/from stand Lower Body Dressing Details (indicate cue type and reason): Educated on adaptive equipment for lower body dressing Toilet Transfer: Minimal assistance;Stand-pivot;BSC   Toileting- Clothing Manipulation and Hygiene: Minimal assistance;Sit to/from stand       Functional mobility during ADLs: Minimal assistance;Cueing for sequencing;Rolling walker General ADL Comments: Verbal cues for hand placement and sequencing use of RW during functional mobility.                         Pertinent Vitals/Pain Pain Assessment: Faces Faces Pain Scale: Hurts little more Pain Descriptors / Indicators: Sore Pain Intervention(s): Limited activity within patient's tolerance;Monitored during session;Premedicated before session;Ice applied          Extremity/Trunk Assessment Upper Extremity Assessment Upper Extremity Assessment: Overall WFL for tasks assessed           Communication Communication Communication: No difficulties   Cognition Arousal/Alertness: Suspect due to medications Behavior During Therapy: WFL for tasks assessed/performed Overall Cognitive Status: Within Functional Limits for tasks assessed                                 General Comments: Increased drowsiness noted during session (suspect due to pain meds); Pt reported feeling light headed upon sitting EOB (BP taken and within normal limits); provided increased time prior to sit to stand transfer and  after standing to further monitor symptoms.    General Comments                  Home Living Family/patient expects to be discharged to:: Private residence Living Arrangements: Children                 Bathroom Shower/Tub: Teaching laboratory technician Toilet: Handicapped height     Home Equipment: Grab  bars - toilet   Additional Comments: Pt will have assistance at home beginning Thursday. Pt plans to wash up at sink until she is able to step into bathtub for bathing.       Prior Functioning/Environment Level of Independence: Independent                 OT Problem List: Decreased strength;Decreased activity tolerance;Impaired balance (sitting and/or standing);Pain      OT Treatment/Interventions: Self-care/ADL training;Therapeutic exercise;Energy conservation;DME and/or AE instruction;Therapeutic activities;Patient/family education    OT Goals(Current goals can be found in the care plan section) Acute Rehab OT Goals Patient Stated Goal: To be independent with ADLs OT Goal Formulation: With patient Time For Goal Achievement: 10/22/16 Potential to Achieve Goals: Good ADL Goals Pt Will Perform Grooming: with min guard assist;standing Pt Will Perform Lower Body Dressing: with min guard assist;sit to/from stand Pt Will Transfer to Toilet: with min guard assist;ambulating;regular height toilet;grab bars Pt Will Perform Toileting - Clothing Manipulation and hygiene: with min guard assist;sit to/from stand  OT Frequency: Min 2X/week                             End of Session Equipment Utilized During Treatment: Gait belt;Rolling walker;Right knee immobilizer CPM Right Knee CPM Right Knee: Off Nurse Communication: Mobility status (Nurse tech )  Activity Tolerance: Patient tolerated treatment well (Required increased time throughout to complete functional mobility due to increased drowsiness ) Patient left: in chair;with call bell/phone within reach;with chair alarm set;with family/visitor present  OT Visit Diagnosis: Unsteadiness on feet (R26.81);Muscle weakness (generalized) (M62.81)                Time: 2863-8177 OT Time Calculation (min): 35 min Charges:  OT General Charges $OT Visit: 1 Procedure OT Evaluation $OT Eval Low Complexity: 1 Procedure OT  Treatments $Self Care/Home Management : 8-22 mins G-Codes:     Lou Cal, OT Pager [phone] 10/15/2016  Amy Reyes 10/15/2016, 9:45 AM

## 2016-10-15 NOTE — Care Management Note (Signed)
Case Management Note  Patient Details  Name: Amy Reyes MRN: [id number] Date of Birth: [birth year]  Subjective/Objective:     66 yo admitted for RIGHT TOTAL KNEE ARTHROPLASTY                Action/Plan: Pt from home. Pt offered choice for HHPT and [facility] chosen. [facility] rep aware of referral. Pt requesting RW and 3in1. Orders received and [facility] DME rep contacted for DME. No other CM needs communicated.  Expected Discharge Date:                  Expected Discharge Plan:  [place]  In-House Referral:     Discharge planning Services  CM Consult  Post Acute Care Choice:  Home Health Choice offered to:  Patient  DME Arranged:  3-N-1, Walker rolling DME Agency:  [place]:  PT [place]:  [facility] (now [facility])  Status of Service:  Completed, signed off  If discussed at [employer] of Stay Meetings, dates discussed:    Additional CommentsLynnell Catalan, RN 10/15/2016, 1:03 PM  [phone]

## 2016-10-15 NOTE — Discharge Instructions (Addendum)
° °Dr. Renlee Floor °Total Joint Specialist °[facility] °[address] °, [place] [address] °[phone] ° °TOTAL KNEE REPLACEMENT POSTOPERATIVE DIRECTIONS ° °Knee Rehabilitation, Guidelines Following Surgery  °Results after knee surgery are often greatly improved when you follow the exercise, range of motion and muscle strengthening exercises prescribed by your doctor. Safety measures are also important to protect the knee from further injury. Any time any of these exercises cause you to have increased pain or swelling in your knee joint, decrease the amount until you are comfortable again and slowly increase them. If you have problems or questions, call your caregiver or physical therapist for advice.  ° °HOME CARE INSTRUCTIONS  °Remove items at home which could result in a fall. This includes throw rugs or furniture in walking pathways.  °· ICE to the affected knee every three hours for 30 minutes at a time and then as needed for pain and swelling.  Continue to use ice on the knee for pain and swelling from surgery. You may notice swelling that will progress down to the foot and ankle.  This is normal after surgery.  Elevate the leg when you are not up walking on it.   °· Continue to use the breathing machine which will help keep your temperature down.  It is common for your temperature to cycle up and down following surgery, especially at night when you are not up moving around and exerting yourself.  The breathing machine keeps your lungs expanded and your temperature down. °· Do not place pillow under knee, focus on keeping the knee straight while resting ° °DIET °You may resume your previous home diet once your are discharged from the hospital. ° °DRESSING / WOUND CARE / SHOWERING °You may start showering once you are discharged home but do not submerge the incision under water. Just pat the incision dry and apply a dry gauze dressing on daily. °Change the surgical dressing  daily and reapply a dry dressing each time. ° °ACTIVITY °Walk with your walker as instructed. °Use walker as long as suggested by your caregivers. °Avoid periods of inactivity such as sitting longer than an hour when not asleep. This helps prevent blood clots.  °You may resume a sexual relationship in one month or when given the OK by your doctor.  °You may return to work once you are cleared by your doctor.  °Do not drive a car for 6 weeks or until released by you surgeon.  °Do not drive while taking narcotics. ° °WEIGHT BEARING °Weight bearing as tolerated with assist device (walker, cane, etc) as directed, use it as long as suggested by your surgeon or therapist, typically at least 4-6 weeks. ° °POSTOPERATIVE CONSTIPATION PROTOCOL °Constipation - defined medically as fewer than three stools per week and severe constipation as less than one stool per week. ° °One of the most common issues patients have following surgery is constipation.  Even if you have a regular bowel pattern at home, your normal regimen is likely to be disrupted due to multiple reasons following surgery.  Combination of anesthesia, postoperative narcotics, change in appetite and fluid intake all can affect your bowels.  In order to avoid complications following surgery, here are some recommendations in order to help you during your recovery period. ° °Colace (docusate) - Pick up an over-the-counter form of Colace or another stool softener and take twice a day as long as you are requiring postoperative pain medications.  Take with a full glass of water   daily.  If you experience loose stools or diarrhea, hold the colace until you stool forms back up.  If your symptoms do not get better within 1 week or if they get worse, check with your doctor. ° °Dulcolax (bisacodyl) - Pick up over-the-counter and take as directed by the product packaging as needed to assist with the movement of your bowels.  Take with a full glass of water.  Use this product as  needed if not relieved by Colace only.  ° °MiraLax (polyethylene glycol) - Pick up over-the-counter to have on hand.  MiraLax is a solution that will increase the amount of water in your bowels to assist with bowel movements.  Take as directed and can mix with a glass of water, juice, soda, coffee, or tea.  Take if you go more than two days without a movement. °Do not use MiraLax more than once per day. Call your doctor if you are still constipated or irregular after using this medication for 7 days in a row. ° °If you continue to have problems with postoperative constipation, please contact the office for further assistance and recommendations.  If you experience "the worst abdominal pain ever" or develop nausea or vomiting, please contact the office immediatly for further recommendations for treatment. ° °ITCHING ° If you experience itching with your medications, try taking only a single pain pill, or even half a pain pill at a time.  You can also use Benadryl over the counter for itching or also to help with sleep.  ° °TED HOSE STOCKINGS °Wear the elastic stockings on both legs for three weeks following surgery during the day but you may remove then at night for sleeping. ° °MEDICATIONS °See your medication summary on the “After Visit Summary” that the nursing staff will review with you prior to discharge.  You may have some home medications which will be placed on hold until you complete the course of blood thinner medication.  It is important for you to complete the blood thinner medication as prescribed by your surgeon.  Continue your approved medications as instructed at time of discharge. ° °PRECAUTIONS °If you experience chest pain or shortness of breath - call 911 immediately for transfer to the hospital emergency department.  °If you develop a fever greater that 101 F, purulent drainage from wound, increased redness or drainage from wound, foul odor from the wound/dressing, or calf pain - CONTACT YOUR  SURGEON.   °                                                °FOLLOW-UP APPOINTMENTS °Make sure you keep all of your appointments after your operation with your surgeon and caregivers. You should call the office at the above phone number and make an appointment for approximately two weeks after the date of your surgery or on the date instructed by your surgeon outlined in the "After Visit Summary". ° ° °RANGE OF MOTION AND STRENGTHENING EXERCISES  °Rehabilitation of the knee is important following a knee injury or an operation. After just a few days of immobilization, the muscles of the thigh which control the knee become weakened and shrink (atrophy). Knee exercises are designed to build up the tone and strength of the thigh muscles and to improve knee motion. Often times heat used for twenty to thirty minutes before working out will loosen   up your tissues and help with improving the range of motion but do not use heat for the first two weeks following surgery. These exercises can be done on a training (exercise) mat, on the floor, on a table or on a bed. Use what ever works the best and is most comfortable for you Knee exercises include:  Leg Lifts - While your knee is still immobilized in a splint or cast, you can do straight leg raises. Lift the leg to 60 degrees, hold for 3 sec, and slowly lower the leg. Repeat 10-20 times 2-3 times daily. Perform this exercise against resistance later as your knee gets better.  Quad and Hamstring Sets - Tighten up the muscle on the front of the thigh (Quad) and hold for 5-10 sec. Repeat this 10-20 times hourly. Hamstring sets are done by pushing the foot backward against an object and holding for 5-10 sec. Repeat as with quad sets.   Leg Slides: Lying on your back, slowly slide your foot toward your buttocks, bending your knee up off the floor (only go as far as is comfortable). Then slowly slide your foot back down until your leg is flat on the floor again.  Angel Wings:  Lying on your back spread your legs to the side as far apart as you can without causing discomfort.  A rehabilitation program following serious knee injuries can speed recovery and prevent re-injury in the future due to weakened muscles. Contact your doctor or a physical therapist for more information on knee rehabilitation.   IF YOU ARE TRANSFERRED TO A SKILLED REHAB FACILITY If the patient is transferred to a skilled rehab facility following release from the hospital, a list of the current medications will be sent to the facility for the patient to continue.  When discharged from the skilled rehab facility, please have the facility set up the patient's [place] prior to being released. Also, the skilled facility will be responsible for providing the patient with their medications at time of release from the facility to include their pain medication, the muscle relaxants, and their blood thinner medication. If the patient is still at the rehab facility at time of the two week follow up appointment, the skilled rehab facility will also need to assist the patient in arranging follow up appointment in our office and any transportation needs.  MAKE SURE YOU:  Understand these instructions.  Get help right away if you are not doing well or get worse.    Pick up stool softner and laxative for home use following surgery while on pain medications. Do not submerge incision under water. Please use good hand washing techniques while changing dressing each day. May shower starting three days after surgery. Please use a clean towel to pat the incision dry following showers. Continue to use ice for pain and swelling after surgery. Do not use any lotions or creams on the incision until instructed by your surgeon.   Information on my medicine - XARELTO (Rivaroxaban)  This medication education was reviewed with me or my healthcare representative as part of my discharge preparation.  The  pharmacist that spoke with me during my hospital stay was:  Eudelia Bunch, [facility]  Why was Xarelto prescribed for you? Xarelto was prescribed for you to reduce the risk of blood clots forming after orthopedic surgery. The medical term for these abnormal blood clots is venous thromboembolism (VTE).  What do you need to know about xarelto ? Take your Xarelto ONCE DAILY  at the same time every day. You may take it either with or without food.  If you have difficulty swallowing the tablet whole, you may crush it and mix in applesauce just prior to taking your dose.  Take Xarelto exactly as prescribed by your doctor and DO NOT stop taking Xarelto without talking to the doctor who prescribed the medication.  Stopping without other VTE prevention medication to take the place of Xarelto may increase your risk of developing a clot.  After discharge, you should have regular check-up appointments with your healthcare provider that is prescribing your Xarelto.    What do you do if you miss a dose? If you miss a dose, take it as soon as you remember on the same day then continue your regularly scheduled once daily regimen the next day. Do not take two doses of Xarelto on the same day.   Important Safety Information A possible side effect of Xarelto is bleeding. You should call your healthcare provider right away if you experience any of the following: ? Bleeding from an injury or your nose that does not stop. ? Unusual colored urine (red or dark brown) or unusual colored stools (red or black). ? Unusual bruising for unknown reasons. ? A serious fall or if you hit your head (even if there is no bleeding).  Some medicines may interact with Xarelto and might increase your risk of bleeding while on Xarelto. To help avoid this, consult your healthcare provider or pharmacist prior to using any new prescription or non-prescription medications, including herbals, vitamins, non-steroidal  anti-inflammatory drugs (NSAIDs) and supplements.  This website has more information on Xarelto: [email or web address]

## 2016-10-15 NOTE — Evaluation (Signed)
Physical Therapy Evaluation Patient Details Name: Amy Reyes MRN: [id number] DOB: June 05, 1951 Today's Date: 10/15/2016   History of Present Illness  66 yo female s/p R TKA 10/14/16. Hx of DM, obesity  Clinical Impression  On eval, pt required Min assist for mobility. She walked ~50 feet with a RW. Pain rated 6/10 with activity. Will follow and progress activity as tolerated.     Follow Up Recommendations Home health PT;Supervision/Assistance - 24 hour (initially)    Equipment Recommendations  Rolling walker with 5" wheels    Recommendations for Other Services OT consult     Precautions / Restrictions Precautions Precautions: Fall;Knee Required Braces or Orthoses: Knee Immobilizer - Right Knee Immobilizer - Right: Discontinue once straight leg raise with < 10 degree lag Restrictions Weight Bearing Restrictions: No RLE Weight Bearing: Weight bearing as tolerated        Mobility  Bed Mobility Overal bed mobility: Needs Assistance Bed Mobility: Supine to Sit     Sit to Supine: Min assist     General bed mobility comments: oob in recliner. Assist for R LE during sit to supine.   Transfers Overall transfer level: Needs assistance Equipment used: Rolling walker (2 wheeled) Transfers: Sit to/from Stand Sit to Stand: Min guard       General transfer comment: close guard for safety. VCs safety, hand/LE placement.   Ambulation/Gait Ambulation/Gait assistance: Min guard Ambulation Distance (Feet): 50 Feet Assistive device: Rolling walker (2 wheeled) Gait Pattern/deviations: Step-to pattern;Step-through pattern;Decreased stride length     General Gait Details: close guard for safety. Pt c/o fatigue, lightheadedness.   Stairs            Wheelchair Mobility    Modified Rankin (Stroke Patients Only)       Balance Overall balance assessment: Needs assistance Sitting-balance support: Feet supported Sitting balance-Leahy Scale: Fair     Standing  balance support: Bilateral upper extremity supported Standing balance-Leahy Scale: Poor                               Pertinent Vitals/Pain Pain Assessment: Faces Faces Pain Scale: Hurts even more Pain Location: R knee Pain Descriptors / Indicators: Sore Pain Intervention(s): Monitored during session;Repositioned;Ice applied    Home Living Family/patient expects to be discharged to:: Private residence Living Arrangements: Children (daughter in town until Friday) Available Help at Discharge: Family Type of Home: Apartment Home Access: Level entry     Home Layout: One level Home Equipment: None Additional Comments: Pt will have assistance at home beginning Thursday. Pt plans to wash up at sink until she is able to step into bathtub for bathing.     Prior Function Level of Independence: Independent               Hand Dominance        Extremity/Trunk Assessment   Upper Extremity Assessment Upper Extremity Assessment: Defer to OT evaluation    Lower Extremity Assessment Lower Extremity Assessment: Generalized weakness (s/p R TKA 4/2)    Cervical / Trunk Assessment Cervical / Trunk Assessment: Normal  Communication   Communication: No difficulties  Cognition Arousal/Alertness: Awake/alert (drowsy) Behavior During Therapy: WFL for tasks assessed/performed Overall Cognitive Status: Within Functional Limits for tasks assessed                                 General Comments: Increased drowsiness noted during  session (suspect due to pain meds)       General Comments      Exercises Total Joint Exercises Ankle Circles/Pumps: AROM;Both;10 reps;Supine Quad Sets: Both;10 reps;Supine Heel Slides: AAROM;Right;10 reps Hip ABduction/ADduction: Right;10 reps;Supine;AAROM Straight Leg Raises: Right;AAROM;10 reps;Supine Goniometric ROM: ~5-60 degrees   Assessment/Plan    PT Assessment Patient needs continued PT services  PT Problem List  Decreased strength;Decreased mobility;Decreased range of motion;Decreased activity tolerance;Decreased balance;Decreased knowledge of use of DME;Pain;Decreased knowledge of precautions       PT Treatment Interventions DME instruction;Therapeutic activities;Gait training;Therapeutic exercise;Patient/family education;Balance training;Functional mobility training    PT Goals (Current goals can be found in the Care Plan section)  Acute Rehab PT Goals Patient Stated Goal: regain independence PT Goal Formulation: With patient/family Time For Goal Achievement: 10/29/16 Potential to Achieve Goals: Good    Frequency 7X/week   Barriers to discharge        Co-evaluation               End of Session Equipment Utilized During Treatment: Gait belt;Right knee immobilizer Activity Tolerance: Patient limited by fatigue;Patient limited by pain Patient left: in bed;with call bell/phone within reach;with family/visitor present   PT Visit Diagnosis: Muscle weakness (generalized) (M62.81);Difficulty in walking, not elsewhere classified (R26.2)    Time: 9688-6484 PT Time Calculation (min) (ACUTE ONLY): 27 min   Charges:   PT Evaluation $PT Eval Low Complexity: 1 Procedure PT Treatments $Gait Training: 8-22 mins   PT G Codes:          Weston Anna, MPT Pager: [phone]

## 2016-10-15 NOTE — Progress Notes (Signed)
Occupational Therapy Treatment Patient Details Name: Amy Reyes MRN: [id number] DOB: 07/16/1950 Today's Date: 10/15/2016    History of present illness 66 yo female s/p R TKA 10/14/16. Hx of DM, obesity   OT comments  Pt progressing towards goals and is more alert this session. Pt completed toilet t/f with comfort height toilet and grab bars, completed standing grooming washing hands at sink. Verbal cues required throughout session for sequencing use of RW and for hand/LE placement during functional mobility transfers. Continue per plan of care.    Follow Up Recommendations  Supervision/Assistance - 24 hour    Equipment Recommendations  None recommended by OT          Precautions / Restrictions Precautions Precautions: Fall;Knee Required Braces or Orthoses: Knee Immobilizer - Right Knee Immobilizer - Right: Discontinue once straight leg raise with < 10 degree lag Restrictions Weight Bearing Restrictions: No RLE Weight Bearing: Weight bearing as tolerated (RLE ) Other Position/Activity Restrictions: WBAT RLE        Mobility Bed Mobility Overal bed mobility: Needs Assistance Bed Mobility: Supine to Sit     Supine to sit: Min assist     General bed mobility comments: for RLE   Transfers Overall transfer level: Needs assistance Equipment used: Rolling walker (2 wheeled) Transfers: Sit to/from Stand Sit to Stand: Min assist         General transfer comment: Verbal cues for hand placement and LE placement    Balance Overall balance assessment: Needs assistance Sitting-balance support: Feet supported Sitting balance-Leahy Scale: Good     Standing balance support: Bilateral upper extremity supported Standing balance-Leahy Scale: Fair                             ADL either performed or assessed with clinical judgement   ADL Overall ADL's : Needs assistance/impaired     Grooming: Wash/dry hands;Min guard;Standing;Cueing for safety Grooming  Details (indicate cue type and reason): Verbal cues for staying within RW during task completion                 Toilet Transfer: Minimal assistance;Cueing for safety;Cueing for sequencing;Comfort height toilet;Grab bars;RW Toilet Transfer Details (indicate cue type and reason): Verbal cues for extending RLE forward and proper hand placement during sit to stand transfer Toileting- Clothing Manipulation and Hygiene: Minimal assistance;Cueing for sequencing;Sit to/from stand       Functional mobility during ADLs: Minimal assistance;Cueing for sequencing;Rolling walker General ADL Comments: Verbal cues for sequencing use of RW and for hand placement during sit to stand transfers; reviwed/educated Pt on knee precautions                       Cognition Arousal/Alertness:  (Some drowsiness, more alert during functional mobility and adl tasks) Behavior During Therapy: WFL for tasks assessed/performed Overall Cognitive Status: Within Functional Limits for tasks assessed                                 General Comments: Some drowsiness at start and end of session, Pt more alert during functional mobility and adl tasks                         Pertinent Vitals/ Pain       Pain Assessment: 0-10 Pain Score: 3  Pain Location: R knee Pain Descriptors /  Indicators: Sore Pain Intervention(s): Limited activity within patient's tolerance;Monitored during session;Premedicated before session;Ice applied  Home Living Family/patient expects to be discharged to:: Private residence Living Arrangements: Children  Available Help at Discharge: Family Type of Home: Apartment Home Access: Level entry     Home Layout: One level               Home Equipment: None          Prior Functioning/Environment Level of Independence: Independent            Frequency  Min 2X/week        Progress Toward Goals  OT Goals(current goals can now be found in the  care plan section)  Progress towards OT goals: Progressing toward goals  Acute Rehab OT Goals Patient Stated Goal: regain independence  Plan Discharge plan remains appropriate                     End of Session Equipment Utilized During Treatment: Gait belt;Rolling walker;Right knee immobilizer  OT Visit Diagnosis: Unsteadiness on feet (R26.81);Muscle weakness (generalized) (M62.81)   Activity Tolerance Patient tolerated treatment well   Patient Left in bed;with call bell/phone within reach             Time: 1258-1320 OT Time Calculation (min): 22 min  Charges: OT General Charges $OT Visit: 1 Procedure OT Treatments $Self Care/Home Management : 8-22 mins  Lou Cal, OT Pager [phone] 10/15/2016   [name] 10/15/2016, 2:15 PM

## 2016-10-15 NOTE — Progress Notes (Signed)
   Subjective: 1 Day Post-Op Procedure(s) (LRB): RIGHT TOTAL KNEE ARTHROPLASTY (Right) CORTISONE LEFT KNEE INJECTION (Left) Patient reports pain as moderate.   Patient seen in rounds for Dr. Wynelle Link. Patient is well, and has had no acute complaints or problems other than pain in the knees. She reports that she had some increase in pain in the left knee after the injection yesterday but that it is feeling better now. She denies SOB and chest pain. No issues overnight. She voices concern about going home tomorrow as she may not have much help at home until Thursday. We will start therapy today.  Plan is to go Home after hospital stay.  Objective: Vital signs in last 24 hours: Temp:  [97.5 F (36.4 C)-98.7 F (37.1 C)] 98.4 F (36.9 C) (04/03 0535) Pulse Rate:  [54-86] 76 (04/03 0535) Resp:  [3-29] 15 (04/03 0535) BP: (101-146)/(50-77) 131/53 (04/03 0535) SpO2:  [95 %-100 %] 98 % (04/03 0535) Weight:  [94.3 kg (208 lb)] 94.3 kg (208 lb) (04/02 1309)  Intake/Output from previous day:  Intake/Output Summary (Last 24 hours) at 10/15/16 0743 Last data filed at 10/15/16 0700  Gross per 24 hour  Intake          3848.83 ml  Output             1890 ml  Net          1958.83 ml     Labs:  Recent Labs  10/15/16 0431  HGB 9.2*    Recent Labs  10/15/16 0431  WBC 13.7*  RBC 3.35*  HCT 27.8*  PLT 236    Recent Labs  10/15/16 0431  NA 138  K 4.3  CL 105  CO2 26  BUN 16  CREATININE 1.17*  GLUCOSE 199*  CALCIUM 8.8*     EXAM General - Patient is Alert and Oriented Extremity - Neurologically intact Intact pulses distally Dorsiflexion/Plantar flexion intact Compartment soft Dressing - dressing C/D/I Motor Function - intact, moving foot and toes well on exam.  Hemovac pulled without difficulty.  Past Medical History:  Diagnosis Date  . Anemia    iron defiencey  . Arthritis   . Bronchitis   . Complication of anesthesia    slow to wake up  . Diabetes mellitus  without complication ([place])    borderline type 2 on metformin  . History of kidney stones   . Hypercholesteremia   . Hypertension     Assessment/Plan: 1 Day Post-Op Procedure(s) (LRB): RIGHT TOTAL KNEE ARTHROPLASTY (Right) CORTISONE LEFT KNEE INJECTION (Left) Principal Problem:   OA (osteoarthritis) of knee  Estimated body mass index is 33.07 kg/m as calculated from the following:   Height as of this encounter: 5' 6.5" (1.689 m).   Weight as of this encounter: 94.3 kg (208 lb). Advance diet Up with therapy D/C IV fluids when tolerating POs well  DVT Prophylaxis - Xarelto Weight-Bearing as tolerated  D/C O2 and Pulse OX and try on Room Air  Will get up with therapy today. Plan for DC home tomorrow pending progress. Will continue to monitor labs. Discussed DC concerns with patient but will still likely plan on DC home tomorrow.   Ardeen Jourdain, PA-C Orthopaedic Surgery 10/15/2016, 7:43 AM

## 2016-10-16 LAB — BASIC METABOLIC PANEL
ANION GAP: 8 (ref 5–15)
BUN: 17 mg/dL (ref 6–20)
CHLORIDE: 104 mmol/L (ref 101–111)
CO2: 27 mmol/L (ref 22–32)
Calcium: 9 mg/dL (ref 8.9–10.3)
Creatinine, Ser: 1.1 mg/dL — ABNORMAL HIGH (ref 0.44–1.00)
GFR calc Af Amer: 60 mL/min — ABNORMAL LOW (ref >60)
GFR calc non Af Amer: 52 mL/min — ABNORMAL LOW (ref >60)
GLUCOSE: 113 mg/dL — AB (ref 65–99)
POTASSIUM: 4.2 mmol/L (ref 3.5–5.1)
Sodium: 139 mmol/L (ref 135–145)

## 2016-10-16 LAB — GLUCOSE, CAPILLARY
GLUCOSE-CAPILLARY: 107 mg/dL — AB (ref 65–99)
GLUCOSE-CAPILLARY: 93 mg/dL (ref 65–99)
Glucose-Capillary: 132 mg/dL — ABNORMAL HIGH (ref 65–99)
Glucose-Capillary: 90 mg/dL (ref 65–99)

## 2016-10-16 LAB — CBC
HEMATOCRIT: 27.5 % — AB (ref 36.0–46.0)
Hemoglobin: 8.8 g/dL — ABNORMAL LOW (ref 12.0–15.0)
MCH: 26.7 pg (ref 26.0–34.0)
MCHC: 32 g/dL (ref 30.0–36.0)
MCV: 83.3 fL (ref 78.0–100.0)
Platelets: 238 10*3/uL (ref 150–400)
RBC: 3.3 MIL/uL — AB (ref 3.87–5.11)
RDW: 15.2 % (ref 11.5–15.5)
WBC: 15.2 10*3/uL — AB (ref 4.0–10.5)

## 2016-10-16 MED ORDER — OXYCODONE HCL 5 MG PO TABS
5.0000 mg | ORAL_TABLET | ORAL | Status: DC | PRN
  Administered 2016-10-16 – 2016-10-17 (×7): 15 mg via ORAL
  Filled 2016-10-16 (×2): qty 3
  Filled 2016-10-16: qty 1
  Filled 2016-10-16 (×5): qty 3

## 2016-10-16 MED ORDER — OXYCODONE HCL 5 MG PO TABS: 5.0000 mg | ORAL_TABLET | ORAL | 0 refills | Status: DC | PRN

## 2016-10-16 NOTE — Progress Notes (Signed)
PT Cancellation Note  Patient Details Name: Amy Reyes MRN: [id number] DOB: 12-17-1950   Cancelled Treatment:    Reason Eval/Treat Not Completed: Pain limiting ability to participate. RN requested PT check back another time. Will check back as schedule allows.    Weston Anna, MPT Pager: [phone]

## 2016-10-16 NOTE — Progress Notes (Signed)
Occupational Therapy Treatment Patient Details Name: Amy Reyes MRN: [id number] DOB: Aug 23, 1950 Today's Date: 10/16/2016    History of present illness 66 yo female s/p R TKA 10/14/16. Hx of DM, obesity   OT comments  Pt progressing towards goals and pain appears to be more manageable this session. Pt demonstrated toilet transfer with BSC over toilet and completed standing grooming at sink. Reviewed AE for ADL self care tasks with Pt. Continue per POC.    Follow Up Recommendations  Supervision/Assistance - 24 hour    Equipment Recommendations  3 in 1 bedside commode          Precautions / Restrictions Precautions Precautions: Fall;Knee Required Braces or Orthoses: Knee Immobilizer - Right Knee Immobilizer - Right: Discontinue post op day 2 Restrictions Weight Bearing Restrictions: No RLE Weight Bearing: Weight bearing as tolerated Other Position/Activity Restrictions: WBAT RLE        Mobility Bed Mobility               General bed mobility comments: OOB in recliner   Transfers Overall transfer level: Needs assistance Equipment used: Rolling walker (2 wheeled) Transfers: Sit to/from Stand Sit to Stand: Min guard         General transfer comment: Verbal cues for hand/LE placement    Balance Overall balance assessment: Needs assistance Sitting-balance support: Feet supported Sitting balance-Leahy Scale: Good     Standing balance support: Bilateral upper extremity supported Standing balance-Leahy Scale: Fair                             ADL either performed or assessed with clinical judgement   ADL       Grooming: Brushing hair;Min guard;Standing Grooming Details (indicate cue type and reason): Pt applied makeup while standing with RW at sink, using sink for UE support                 Toilet Transfer: Min Forensic psychologist Details (indicate cue type and reason): BSC over toilet, educated on use of foot stool to rest  RLE while sitting on BSC         Functional mobility during ADLs: Min guard;Rolling walker General ADL Comments: Reviwed hand/LE placement during ADL and functional mobility tasks. Pt completed standing grooming and practiced toilet transfer. Reviewed AE for ADL self care tasks                       Cognition Arousal/Alertness: Awake/alert Behavior During Therapy: WFL for tasks assessed/performed Overall Cognitive Status: Within Functional Limits for tasks assessed                                                    General Comments      Pertinent Vitals/ Pain       Pain Assessment: Faces Pain Score: 6  Faces Pain Scale: Hurts a little bit Pain Location: R knee Pain Descriptors / Indicators: Sore Pain Intervention(s): Limited activity within patient's tolerance;Monitored during session;Ice applied;Premedicated before session  Frequency  Min 2X/week        Progress Toward Goals  OT Goals(current goals can now be found in the care plan section)  Progress towards OT goals: Progressing toward goals  Acute Rehab OT Goals Patient Stated Goal: regain independence OT Goal Formulation: With patient Time For Goal Achievement: 10/22/16  Plan Discharge plan remains appropriate                     End of Session Equipment Utilized During Treatment: Gait belt;Rolling walker;Right knee immobilizer  OT Visit Diagnosis: Unsteadiness on feet (R26.81);Muscle weakness (generalized) (M62.81)   Activity Tolerance Patient tolerated treatment well   Patient Left in chair;with call bell/phone within reach;with family/visitor present   Nurse Communication          Time: 0100-7121 OT Time Calculation (min): 34 min  Charges: OT General Charges $OT Visit: 1 Procedure OT Treatments $Self Care/Home Management : 23-37 mins  Amy Reyes, OT Pager  [phone] 10/16/2016    Amy Reyes 10/16/2016, 1:06 PM

## 2016-10-16 NOTE — Progress Notes (Signed)
qPhysical Therapy Treatment Patient Details Name: Amy Reyes MRN: [id number] DOB: 04-02-1951 Today's Date: 10/16/2016    History of Present Illness 66 yo female s/p R TKA 10/14/16. Hx of DM, obesity    PT Comments    Progressing with mobility.    Follow Up Recommendations  Home health PT;Supervision/Assistance - 24 hour     Equipment Recommendations  Rolling walker with 5" wheels    Recommendations for Other Services       Precautions / Restrictions Precautions Precautions: Fall;Knee Required Braces or Orthoses: Knee Immobilizer - Right Knee Immobilizer - Right: Discontinue once straight leg raise with < 10 degree lag Restrictions Weight Bearing Restrictions: No RLE Weight Bearing: Weight bearing as tolerated Other Position/Activity Restrictions: WBAT RLE     Mobility  Bed Mobility Overal bed mobility: Needs Assistance Bed Mobility: Supine to Sit;Sit to Supine     Supine to sit: Min guard Sit to supine: Min guard   General bed mobility comments: close guard for safety. Pt hooked R LE with L foot to get leg onto bed.   Transfers Overall transfer level: Needs assistance Equipment used: Rolling walker (2 wheeled) Transfers: Sit to/from Stand Sit to Stand: Min guard         General transfer comment: Verbal cues for hand/LE placement. close guard for safety.  Ambulation/Gait Ambulation/Gait assistance: Min guard Ambulation Distance (Feet): 115 Feet Assistive device: Rolling walker (2 wheeled) Gait Pattern/deviations: Step-to pattern;Antalgic     General Gait Details: close guard for safety.   Stairs            Wheelchair Mobility    Modified Rankin (Stroke Patients Only)       Balance Overall balance assessment: Needs assistance Sitting-balance support: Feet supported Sitting balance-Leahy Scale: Good     Standing balance support: Bilateral upper extremity supported Standing balance-Leahy Scale: Fair                               Cognition Arousal/Alertness: Awake/alert Behavior During Therapy: WFL for tasks assessed/performed Overall Cognitive Status: Within Functional Limits for tasks assessed                                        Exercises      General Comments        Pertinent Vitals/Pain Pain Assessment: 0-10 Pain Score: 4  Faces Pain Scale: Hurts a little bit Pain Location: R knee Pain Descriptors / Indicators: Sore Pain Intervention(s): Monitored during session;Repositioned    Home Living                      Prior Function            PT Goals (current goals can now be found in the care plan section) Acute Rehab PT Goals Patient Stated Goal: regain independence Progress towards PT goals: Progressing toward goals    Frequency    7X/week      PT Plan Current plan remains appropriate    Co-evaluation             End of Session Equipment Utilized During Treatment: Gait belt;Right knee immobilizer Activity Tolerance: Patient tolerated treatment well Patient left: in bed;with call bell/phone within reach;with family/visitor present   PT Visit Diagnosis: Muscle weakness (generalized) (M62.81);Difficulty in walking, not elsewhere classified (R26.2)  Time: 2993-7169 PT Time Calculation (min) (ACUTE ONLY): 18 min  Charges:  $Gait Training: 8-22 mins                    G Codes:          Weston Anna, MPT Pager: [phone]

## 2016-10-16 NOTE — Progress Notes (Signed)
qPhysical Therapy Treatment Patient Details Name: Amy Reyes MRN: [id number] DOB: [birth year] Today's Date: 10/16/2016    History of Present Illness 66 yo female s/p R TKA 10/14/16. Hx of DM, obesity    PT Comments    Progressing with mobility. Pt reports pain control is better at this time and she is agreeable to working with PT. Will continue to progress activity as able.    Follow Up Recommendations  Home health PT;Supervision/Assistance - 24 hour     Equipment Recommendations  Rolling walker with 5" wheels    Recommendations for Other Services       Precautions / Restrictions Precautions Precautions: Fall;Knee Required Braces or Orthoses: Knee Immobilizer - Right Knee Immobilizer - Right: Discontinue post op day 2 Restrictions Weight Bearing Restrictions: No RLE Weight Bearing: Weight bearing as tolerated    Mobility  Bed Mobility               General bed mobility comments: oob in recliner  Transfers Overall transfer level: Needs assistance Equipment used: Rolling walker (2 wheeled) Transfers: Sit to/from Stand Sit to Stand: Min guard         General transfer comment: close guard for safety. VCs safety, hand/LE placement  Ambulation/Gait Ambulation/Gait assistance: Min guard Ambulation Distance (Feet): 85 Feet Assistive device: Rolling walker (2 wheeled) Gait Pattern/deviations: Step-to pattern;Antalgic     General Gait Details: close guard for safety. Pt c/o fatigue, lightheadedness. 2 brief standing rest breaks taken   Stairs            Wheelchair Mobility    Modified Rankin (Stroke Patients Only)       Balance                                            Cognition Arousal/Alertness: Awake/alert Behavior During Therapy: WFL for tasks assessed/performed Overall Cognitive Status: Within Functional Limits for tasks assessed                                        Exercises Total Joint  Exercises Ankle Circles/Pumps: AROM;Both;10 reps;Supine Quad Sets: Both;10 reps;Supine;AROM Heel Slides: AAROM;Right;10 reps;Supine Hip ABduction/ADduction: Right;10 reps;Supine;AAROM Straight Leg Raises: Right;AAROM;10 reps;Supine Goniometric ROM: ~5-65 degrees    General Comments        Pertinent Vitals/Pain Pain Assessment: 0-10 Pain Score: 6  Pain Location: R knee Pain Descriptors / Indicators: Aching;Sore Pain Intervention(s): Monitored during session;Repositioned;Ice applied    Home Living                      Prior Function            PT Goals (current goals can now be found in the care plan section) Progress towards PT goals: Progressing toward goals    Frequency    7X/week      PT Plan Current plan remains appropriate    Co-evaluation             End of Session Equipment Utilized During Treatment: Gait belt;Right knee immobilizer Activity Tolerance: Patient tolerated treatment well Patient left: in chair;with call bell/phone within reach;with family/visitor present   PT Visit Diagnosis: Muscle weakness (generalized) (M62.81);Difficulty in walking, not elsewhere classified (R26.2)     Time: 0998-3382 PT Time Calculation (min) (ACUTE ONLY): 25  min  Charges:  $Gait Training: 8-22 mins $Therapeutic Exercise: 8-22 mins                    G Codes:         Weston Anna, MPT Pager: [phone]

## 2016-10-16 NOTE — Progress Notes (Signed)
   Subjective: 2 Days Post-Op Procedure(s) (LRB): RIGHT TOTAL KNEE ARTHROPLASTY (Right) CORTISONE LEFT KNEE INJECTION (Left) Patient reports pain as moderate.   Patient seen in rounds with Dr. Wynelle Link. Patient is well, but has had some minor complaints of pain in the right knee, requiring pain medications and muscle spasms in the right thigh. No SOB or chest pain. Voiding well. Positive flatus. She reports that she has no help at home until tomorrow.  Plan is to go Home after hospital stay.  Objective: Vital signs in last 24 hours: Temp:  [97.8 F (36.6 C)-98.6 F (37 C)] 98.6 F (37 C) (04/04 0501) Pulse Rate:  [60-82] 82 (04/04 0501) Resp:  [16-17] 17 (04/04 0501) BP: (128-164)/(58-67) 164/64 (04/04 0501) SpO2:  [96 %-100 %] 99 % (04/04 0501)  Intake/Output from previous day:  Intake/Output Summary (Last 24 hours) at 10/16/16 0736 Last data filed at 10/16/16 0600  Gross per 24 hour  Intake             1360 ml  Output              532 ml  Net              828 ml   Labs:  Recent Labs  10/15/16 0431 10/16/16 0415  HGB 9.2* 8.8*    Recent Labs  10/15/16 0431 10/16/16 0415  WBC 13.7* 15.2*  RBC 3.35* 3.30*  HCT 27.8* 27.5*  PLT 236 238    Recent Labs  10/15/16 0431 10/16/16 0415  NA 138 139  K 4.3 4.2  CL 105 104  CO2 26 27  BUN 16 17  CREATININE 1.17* 1.10*  GLUCOSE 199* 113*  CALCIUM 8.8* 9.0    EXAM General - Patient is Alert and Oriented Extremity - Neurologically intact Intact pulses distally Dorsiflexion/Plantar flexion intact Compartment soft Dressing/Incision - clean, dry, no drainage Motor Function - intact, moving foot and toes well on exam.   Past Medical History:  Diagnosis Date  . Anemia    iron defiencey  . Arthritis   . Bronchitis   . Complication of anesthesia    slow to wake up  . Diabetes mellitus without complication ([place])    borderline type 2 on metformin  . History of kidney stones   . Hypercholesteremia   .  Hypertension     Assessment/Plan: 2 Days Post-Op Procedure(s) (LRB): RIGHT TOTAL KNEE ARTHROPLASTY (Right) CORTISONE LEFT KNEE INJECTION (Left) Principal Problem:   OA (osteoarthritis) of knee  Estimated body mass index is 33.07 kg/m as calculated from the following:   Height as of this encounter: 5' 6.5" (1.689 m).   Weight as of this encounter: 94.3 kg (208 lb). Advance diet Up with therapy Plan for discharge tomorrow  DVT Prophylaxis - Xarelto Weight-Bearing as tolerated   Continue therapy today. Delay DC until tomorrow due to no help at home today. DC tomorrow with HHPT.   Ardeen Jourdain, PA-C Orthopaedic Surgery 10/16/2016, 7:36 AM

## 2016-10-17 LAB — CBC
HCT: 26.1 % — ABNORMAL LOW (ref 36.0–46.0)
Hemoglobin: 8.3 g/dL — ABNORMAL LOW (ref 12.0–15.0)
MCH: 26.7 pg (ref 26.0–34.0)
MCHC: 31.8 g/dL (ref 30.0–36.0)
MCV: 83.9 fL (ref 78.0–100.0)
PLATELETS: 223 10*3/uL (ref 150–400)
RBC: 3.11 MIL/uL — ABNORMAL LOW (ref 3.87–5.11)
RDW: 15.4 % (ref 11.5–15.5)
WBC: 11.3 10*3/uL — ABNORMAL HIGH (ref 4.0–10.5)

## 2016-10-17 LAB — GLUCOSE, CAPILLARY
GLUCOSE-CAPILLARY: 96 mg/dL (ref 65–99)
GLUCOSE-CAPILLARY: 122 mg/dL — AB (ref 65–99)

## 2016-10-17 NOTE — Progress Notes (Signed)
   Subjective: 3 Days Post-Op Procedure(s) (LRB): RIGHT TOTAL KNEE ARTHROPLASTY (Right) CORTISONE LEFT KNEE INJECTION (Left) Patient reports pain as moderate.   Patient seen in rounds with Dr. Wynelle Link. Patient is well, and has had no acute complaints or problems. Muscle spasms under better control today. Voiding well. Positive flatus. No SOB or chest pain.    Objective: Vital signs in last 24 hours: Temp:  [98.4 F (36.9 C)-99.5 F (37.5 C)] 99.5 F (37.5 C) (04/05 0520) Pulse Rate:  [75-90] 90 (04/05 0520) Resp:  [17-18] 18 (04/05 0520) BP: (133-161)/(57-69) 150/57 (04/05 0520) SpO2:  [93 %-99 %] 97 % (04/05 0520)  Intake/Output from previous day:  Intake/Output Summary (Last 24 hours) at 10/17/16 0827 Last data filed at 10/17/16 4967  Gross per 24 hour  Intake             1050 ml  Output                0 ml  Net             1050 ml     Labs:  Recent Labs  10/15/16 0431 10/16/16 0415 10/17/16 0419  HGB 9.2* 8.8* 8.3*    Recent Labs  10/16/16 0415 10/17/16 0419  WBC 15.2* 11.3*  RBC 3.30* 3.11*  HCT 27.5* 26.1*  PLT 238 223    Recent Labs  10/15/16 0431 10/16/16 0415  NA 138 139  K 4.3 4.2  CL 105 104  CO2 26 27  BUN 16 17  CREATININE 1.17* 1.10*  GLUCOSE 199* 113*  CALCIUM 8.8* 9.0    EXAM General - Patient is Alert and Oriented Extremity - Neurologically intact Intact pulses distally Dorsiflexion/Plantar flexion intact No cellulitis present Compartment soft Dressing/Incision - clean, dry, no drainage Motor Function - intact, moving foot and toes well on exam.   Past Medical History:  Diagnosis Date  . Anemia    iron defiencey  . Arthritis   . Bronchitis   . Complication of anesthesia    slow to wake up  . Diabetes mellitus without complication ([place])    borderline type 2 on metformin  . History of kidney stones   . Hypercholesteremia   . Hypertension     Assessment/Plan: 3 Days Post-Op Procedure(s) (LRB): RIGHT TOTAL KNEE  ARTHROPLASTY (Right) CORTISONE LEFT KNEE INJECTION (Left) Principal Problem:   OA (osteoarthritis) of knee  Estimated body mass index is 33.07 kg/m as calculated from the following:   Height as of this encounter: 5' 6.5" (1.689 m).   Weight as of this encounter: 94.3 kg (208 lb). Advance diet Up with therapy Discharge home with home health  DVT Prophylaxis - Xarelto Weight-Bearing as tolerated   Patient has progressed well with therapy. DC home today with HHPT .  Ardeen Jourdain, PA-C Orthopaedic Surgery 10/17/2016, 8:27 AM

## 2016-10-17 NOTE — Progress Notes (Signed)
OT Note:   Checked on Pt this AM, Pt verbalizes feeling comfortable with completing ADL self care tasks and functional mobility upon returning home with family assisting PRN. No further OT needs at this time.   Lou Cal, OT Pager [phone] 10/17/2016

## 2016-10-17 NOTE — Progress Notes (Signed)
RN reviewed discharge instructions with patient and family. All questions answered.   Paperwork and prescriptions given.   NT rolled patient down with all belongings to family car. 

## 2016-10-17 NOTE — Progress Notes (Signed)
qPhysical Therapy Treatment Patient Details Name: Amy Reyes MRN: [id number] DOB: Nov 02, 1950 Today's Date: 10/17/2016    History of Present Illness 66 yo female s/p R TKA 10/14/16. Hx of DM, obesity    PT Comments    Progressing with mobility. Pt somewhat drowsy this session-likely due to meds per RN. Reviewed/practiced exercises, gait training, and stair training. Pt stated she did not have any steps. All education completed. Ready to d/c from PT standpoint.     Follow Up Recommendations  Home health PT;Supervision/Assistance - 24 hour     Equipment Recommendations  Rolling walker with 5" wheels    Recommendations for Other Services OT consult     Precautions / Restrictions Precautions Precautions: Fall;Knee Required Braces or Orthoses: Knee Immobilizer - Right Knee Immobilizer - Right: Discontinue once straight leg raise with < 10 degree lag Restrictions Weight Bearing Restrictions: No RLE Weight Bearing: Weight bearing as tolerated    Mobility  Bed Mobility Overal bed mobility: Needs Assistance Bed Mobility: Supine to Sit     Supine to sit: Min guard     General bed mobility comments: close guard for safety. Pt hooked R LE with L foot to get leg onto bed.   Transfers Overall transfer level: Needs assistance Equipment used: Rolling walker (2 wheeled) Transfers: Sit to/from Stand Sit to Stand: Min guard         General transfer comment: Verbal cues for hand/LE placement. close guard for safety.  Ambulation/Gait Ambulation/Gait assistance: Min guard Ambulation Distance (Feet): 115 Feet Assistive device: Rolling walker (2 wheeled) Gait Pattern/deviations: Step-to pattern;Decreased stride length     General Gait Details: close guard for safety.   Stairs            Wheelchair Mobility    Modified Rankin (Stroke Patients Only)       Balance                                            Cognition Arousal/Alertness:  Awake/alert Behavior During Therapy: WFL for tasks assessed/performed Overall Cognitive Status: Within Functional Limits for tasks assessed                                        Exercises Total Joint Exercises Ankle Circles/Pumps: AROM;Both;15 reps;Supine Quad Sets: AROM;15 reps;Both;Supine Heel Slides: AAROM;Right;10 reps;Supine Hip ABduction/ADduction: Right;10 reps;Supine;AAROM Straight Leg Raises: Right;AAROM;10 reps;Supine Goniometric ROM: ~5-60 degrees (limited by pain this session)    General Comments        Pertinent Vitals/Pain Pain Assessment: 0-10 Pain Score: 4  Pain Location: R knee with activity Pain Descriptors / Indicators: Sore Pain Intervention(s): Monitored during session;Repositioned;Ice applied    Home Living                      Prior Function            PT Goals (current goals can now be found in the care plan section) Progress towards PT goals: Progressing toward goals    Frequency    7X/week      PT Plan Current plan remains appropriate    Co-evaluation             End of Session Equipment Utilized During Treatment: Gait belt;Right knee immobilizer Activity Tolerance: Patient tolerated treatment  well Patient left: in bed;with call bell/phone within reach;with family/visitor present   PT Visit Diagnosis: Muscle weakness (generalized) (M62.81);Difficulty in walking, not elsewhere classified (R26.2)     Time: 4862-8241 PT Time Calculation (min) (ACUTE ONLY): 40 min  Charges:  $Gait Training: 23-37 mins $Therapeutic Exercise: 8-22 mins                    G Codes:          Weston Anna, MPT Pager: [phone]

## 2016-10-19 DIAGNOSIS — E1122 Type 2 diabetes mellitus with diabetic chronic kidney disease: Secondary | ICD-10-CM | POA: Diagnosis not present

## 2016-10-19 DIAGNOSIS — I129 Hypertensive chronic kidney disease with stage 1 through stage 4 chronic kidney disease, or unspecified chronic kidney disease: Secondary | ICD-10-CM | POA: Diagnosis not present

## 2016-10-19 DIAGNOSIS — M1712 Unilateral primary osteoarthritis, left knee: Secondary | ICD-10-CM | POA: Diagnosis not present

## 2016-10-19 DIAGNOSIS — N183 Chronic kidney disease, stage 3 (moderate): Secondary | ICD-10-CM | POA: Diagnosis not present

## 2016-10-19 DIAGNOSIS — D631 Anemia in chronic kidney disease: Secondary | ICD-10-CM | POA: Diagnosis not present

## 2016-10-19 DIAGNOSIS — Z471 Aftercare following joint replacement surgery: Secondary | ICD-10-CM | POA: Diagnosis not present

## 2016-10-21 DIAGNOSIS — E1122 Type 2 diabetes mellitus with diabetic chronic kidney disease: Secondary | ICD-10-CM | POA: Diagnosis not present

## 2016-10-21 DIAGNOSIS — M1712 Unilateral primary osteoarthritis, left knee: Secondary | ICD-10-CM | POA: Diagnosis not present

## 2016-10-21 DIAGNOSIS — D631 Anemia in chronic kidney disease: Secondary | ICD-10-CM | POA: Diagnosis not present

## 2016-10-21 DIAGNOSIS — N183 Chronic kidney disease, stage 3 (moderate): Secondary | ICD-10-CM | POA: Diagnosis not present

## 2016-10-21 DIAGNOSIS — I129 Hypertensive chronic kidney disease with stage 1 through stage 4 chronic kidney disease, or unspecified chronic kidney disease: Secondary | ICD-10-CM | POA: Diagnosis not present

## 2016-10-21 DIAGNOSIS — Z471 Aftercare following joint replacement surgery: Secondary | ICD-10-CM | POA: Diagnosis not present

## 2016-10-21 NOTE — Discharge Summary (Signed)
Physician Discharge Summary   Patient ID: Amy Reyes MRN: [id number] DOB/AGE: 1951/02/11 66 y.o.  Admit date: 10/14/2016 Discharge date: 10/17/2016  Primary Diagnosis: Primary osteoarthritis right knee   Admission Diagnoses:  Past Medical History:  Diagnosis Date  . Anemia    iron defiencey  . Arthritis   . Bronchitis   . Complication of anesthesia    slow to wake up  . Diabetes mellitus without complication ([place])    borderline type 2 on metformin  . History of kidney stones   . Hypercholesteremia   . Hypertension    Discharge Diagnoses:   Principal Problem:   OA (osteoarthritis) of knee  Estimated body mass index is 33.07 kg/m as calculated from the following:   Height as of this encounter: 5' 6.5" (1.689 m).   Weight as of this encounter: 94.3 kg (208 lb).  Procedure:  Procedure(s) (LRB): RIGHT TOTAL KNEE ARTHROPLASTY (Right) CORTISONE LEFT KNEE INJECTION (Left)   Consults: None  HPI: The patient is a 66 year old female who presented for follow up of their knee. The patient is being followed for their bilateral knee pain and osteoarthritis. They are now months out from cortisone injections (and from Euflexxa series). Symptoms reported include: pain, swelling, aching, stiffness, grinding and difficulty ambulating. The patient feels that they are doing poorly and report their pain level to be severe. The following medication has been used for pain control: Tylenol (does not really help. She states that her PCP took her off Diclofenac due to decreased kidney funtion on recent labs. She is still using Voltaren gel occasionally). The patient has not gotten any relief of their symptoms with Cortisone injections or viscosupplementation. She states that she is very discouraged since none of the injections have worked. She states that she previously has a series of visco several years ago while she was in [place].  Unfortunately, her right knee is getting progressively  worse. She has also had a cortisone in the right knee which provided short-term benefit, but she said that she cannot tolerate, however, her right knee is doing anymore and it is really limiting her. She says it is at a stage where she is ready to get it fixed. They have been treated conservatively in the past for the above stated problem and despite conservative measures, they continue to have progressive pain and severe functional limitations and dysfunction. They have failed non-operative management including home exercise, medications, and injections. It is felt that they would benefit from undergoing total joint replacement. Risks and benefits of the procedure have been discussed with the patient and they elect to proceed with surgery. There are no active contraindications to surgery such as ongoing infection or rapidly progressive neurological disease.  Laboratory Data: Admission on 10/14/2016, Discharged on 10/17/2016  Component Date Value Ref Range Status  . Glucose-Capillary 10/14/2016 97  65 - 99 mg/dL Final  . Comment 1 10/14/2016 Notify RN   Final  . Glucose-Capillary 10/14/2016 85  65 - 99 mg/dL Final  . WBC 10/15/2016 13.7* 4.0 - 10.5 K/uL Final  . RBC 10/15/2016 3.35* 3.87 - 5.11 MIL/uL Final  . Hemoglobin 10/15/2016 9.2* 12.0 - 15.0 g/dL Final  . HCT 10/15/2016 27.8* 36.0 - 46.0 % Final  . MCV 10/15/2016 83.0  78.0 - 100.0 fL Final  . MCH 10/15/2016 27.5  26.0 - 34.0 pg Final  . MCHC 10/15/2016 33.1  30.0 - 36.0 g/dL Final  . RDW 10/15/2016 14.7  11.5 - 15.5 % Final  .  Platelets 10/15/2016 236  150 - 400 K/uL Final  . Sodium 10/15/2016 138  135 - 145 mmol/L Final  . Potassium 10/15/2016 4.3  3.5 - 5.1 mmol/L Final  . Chloride 10/15/2016 105  101 - 111 mmol/L Final  . CO2 10/15/2016 26  22 - 32 mmol/L Final  . Glucose, Bld 10/15/2016 199* 65 - 99 mg/dL Final  . BUN 10/15/2016 16  6 - 20 mg/dL Final  . Creatinine, Ser 10/15/2016 1.17* 0.44 - 1.00 mg/dL Final  . Calcium  10/15/2016 8.8* 8.9 - 10.3 mg/dL Final  . GFR calc non Af Amer 10/15/2016 48* >60 mL/min Final  . GFR calc Af Amer 10/15/2016 55* >60 mL/min Final   Comment: (NOTE) The eGFR has been calculated using the CKD EPI equation. This calculation has not been validated in all clinical situations. eGFR's persistently <60 mL/min signify possible Chronic Kidney Disease.   . Anion gap 10/15/2016 7  5 - 15 Final  . Glucose-Capillary 10/14/2016 279* 65 - 99 mg/dL Final  . Glucose-Capillary 10/14/2016 241* 65 - 99 mg/dL Final  . Glucose-Capillary 10/15/2016 137* 65 - 99 mg/dL Final  . Glucose-Capillary 10/15/2016 100* 65 - 99 mg/dL Final  . WBC 10/16/2016 15.2* 4.0 - 10.5 K/uL Final  . RBC 10/16/2016 3.30* 3.87 - 5.11 MIL/uL Final  . Hemoglobin 10/16/2016 8.8* 12.0 - 15.0 g/dL Final  . HCT 10/16/2016 27.5* 36.0 - 46.0 % Final  . MCV 10/16/2016 83.3  78.0 - 100.0 fL Final  . MCH 10/16/2016 26.7  26.0 - 34.0 pg Final  . MCHC 10/16/2016 32.0  30.0 - 36.0 g/dL Final  . RDW 10/16/2016 15.2  11.5 - 15.5 % Final  . Platelets 10/16/2016 238  150 - 400 K/uL Final  . Sodium 10/16/2016 139  135 - 145 mmol/L Final  . Potassium 10/16/2016 4.2  3.5 - 5.1 mmol/L Final  . Chloride 10/16/2016 104  101 - 111 mmol/L Final  . CO2 10/16/2016 27  22 - 32 mmol/L Final  . Glucose, Bld 10/16/2016 113* 65 - 99 mg/dL Final  . BUN 10/16/2016 17  6 - 20 mg/dL Final  . Creatinine, Ser 10/16/2016 1.10* 0.44 - 1.00 mg/dL Final  . Calcium 10/16/2016 9.0  8.9 - 10.3 mg/dL Final  . GFR calc non Af Amer 10/16/2016 52* >60 mL/min Final  . GFR calc Af Amer 10/16/2016 60* >60 mL/min Final   Comment: (NOTE) The eGFR has been calculated using the CKD EPI equation. This calculation has not been validated in all clinical situations. eGFR's persistently <60 mL/min signify possible Chronic Kidney Disease.   . Anion gap 10/16/2016 8  5 - 15 Final  . Glucose-Capillary 10/15/2016 162* 65 - 99 mg/dL Final  . Glucose-Capillary 10/15/2016  204* 65 - 99 mg/dL Final  . Glucose-Capillary 10/16/2016 132* 65 - 99 mg/dL Final  . Glucose-Capillary 10/16/2016 90  65 - 99 mg/dL Final  . WBC 10/17/2016 11.3* 4.0 - 10.5 K/uL Final  . RBC 10/17/2016 3.11* 3.87 - 5.11 MIL/uL Final  . Hemoglobin 10/17/2016 8.3* 12.0 - 15.0 g/dL Final  . HCT 10/17/2016 26.1* 36.0 - 46.0 % Final  . MCV 10/17/2016 83.9  78.0 - 100.0 fL Final  . MCH 10/17/2016 26.7  26.0 - 34.0 pg Final  . MCHC 10/17/2016 31.8  30.0 - 36.0 g/dL Final  . RDW 10/17/2016 15.4  11.5 - 15.5 % Final  . Platelets 10/17/2016 223  150 - 400 K/uL Final  . Glucose-Capillary 10/16/2016 107* 65 -  99 mg/dL Final  . Glucose-Capillary 10/16/2016 93  65 - 99 mg/dL Final  . Glucose-Capillary 10/17/2016 96  65 - 99 mg/dL Final  . Glucose-Capillary 10/17/2016 122* 65 - 99 mg/dL Final  Hospital Outpatient Visit on 10/08/2016  Component Date Value Ref Range Status  . aPTT 10/08/2016 33  24 - 36 seconds Final  . Prothrombin Time 10/08/2016 13.0  11.4 - 15.2 seconds Final  . INR 10/08/2016 0.98   Final  . ABO/RH(D) 10/08/2016 A POS   Final  . Antibody Screen 10/08/2016 NEG   Final  . Sample Expiration 10/08/2016 10/17/2016   Final  . Extend sample reason 10/08/2016 NO TRANSFUSIONS OR PREGNANCY IN THE PAST 3 MONTHS   Final  . Hgb A1c MFr Bld 10/08/2016 6.2* 4.8 - 5.6 % Final   Comment: (NOTE)         Pre-diabetes: 5.7 - 6.4         Diabetes: >6.4         Glycemic control for adults with diabetes: <7.0   . Mean Plasma Glucose 10/08/2016 131  mg/dL Final   Comment: (NOTE) Performed At: [facility] [place], [place] [id number] Lindon Romp MD PP:[id number]   . MRSA, PCR 10/08/2016 INVALID RESULTS, SPECIMEN SENT FOR CULTURE* NEGATIVE Final  . Staphylococcus aureus 10/08/2016 INVALID RESULTS, SPECIMEN SENT FOR CULTURE* NEGATIVE Final   Comment: STANLEY,K @ [id number] ON [id number] BY POTEAT,S        The Xpert SA Assay (FDA approved for NASAL specimens in patients over 59  years of age), is one component of a comprehensive surveillance program.  Test performance has been validated by [facility] for patients greater than or equal to 35 year old. It is not intended to diagnose infection nor to guide or monitor treatment.   . Glucose-Capillary 10/08/2016 82  65 - 99 mg/dL Final  . ABO/RH(D) 10/08/2016 A POS   Final  . Specimen Description 10/08/2016 NOSE   Final  . Special Requests 10/08/2016 NONE   Final  . Culture 10/08/2016    Final                   Value:NO MRSA DETECTED Performed at [facility], [place] [address] [place], [place] [address]   . Report Status 10/08/2016 10/10/2016 FINAL   Final      Hospital Course: ASA FATH is a 66 y.o. who was admitted to [facility]. They were brought to the operating room on 10/14/2016 and underwent Procedure(s): RIGHT TOTAL KNEE ARTHROPLASTY CORTISONE LEFT KNEE INJECTION.  Patient tolerated the procedure well and was later transferred to the recovery room and then to the orthopaedic floor for postoperative care.  They were given PO and IV analgesics for pain control following their surgery.  They were given 24 hours of postoperative antibiotics of  Anti-infectives    Start     Dose/Rate Route Frequency Ordered Stop   10/14/16 1530  ceFAZolin (ANCEF) IVPB 2g/100 mL premix     2 g 200 mL/hr over 30 Minutes Intravenous Every 6 hours 10/14/16 1319 10/14/16 2215   10/14/16 0740  ceFAZolin (ANCEF) 2-4 GM/100ML-% IVPB    Comments:  Mardelle Matte   : cabinet override      10/14/16 0740 10/14/16 0930   10/14/16 0734  ceFAZolin (ANCEF) IVPB 2g/100 mL premix     2 g 200 mL/hr over 30 Minutes Intravenous On call to O.R. 10/14/16 7673 10/14/16 0930  and started on DVT prophylaxis in the form of Xarelto.   PT and OT were ordered for total joint protocol.  Discharge planning consulted to help with postop disposition and equipment needs.  Patient had a difficult night on the evening of surgery  due to poor pain control.  They started to get up OOB with therapy on day one. Hemovac drain was pulled without difficulty.  Continued to work with therapy into day two.  Dressing was changed on day two and the incision was clean and dry. She saw some improvement but reported that she did not have any help at home until the next day.  By day three, the patient had progressed with therapy and meeting their goals.  Incision was healing well.  Patient was seen in rounds and was ready to go home.   Diet: Cardiac diet and Diabetic diet Activity:WBAT Follow-up:in 2 weeks Disposition - Home Discharged Condition: stable   Discharge Instructions    Call MD / Call 911    Complete by:  As directed    If you experience chest pain or shortness of breath, CALL 911 and be transported to the hospital emergency room.  If you develope a fever above 101 F, pus (white drainage) or increased drainage or redness at the wound, or calf pain, call your surgeon's office.   Constipation Prevention    Complete by:  As directed    Drink plenty of fluids.  Prune juice may be helpful.  You may use a stool softener, such as Colace (over the counter) 100 mg twice a day.  Use MiraLax (over the counter) for constipation as needed.   Diet - low sodium heart healthy    Complete by:  As directed    Diet Carb Modified    Complete by:  As directed    Discharge instructions    Complete by:  As directed    Dr. Gaynelle Arabian Total Joint Specialist [facility] [address], [place], [place] [address] [phone]  TOTAL KNEE REPLACEMENT POSTOPERATIVE DIRECTIONS  Knee Rehabilitation, Guidelines Following Surgery  Results after knee surgery are often greatly improved when you follow the exercise, range of motion and muscle strengthening exercises prescribed by your doctor. Safety measures are also important to protect the knee from further injury. Any time any of these exercises cause you to have  increased pain or swelling in your knee joint, decrease the amount until you are comfortable again and slowly increase them. If you have problems or questions, call your caregiver or physical therapist for advice.   HOME CARE INSTRUCTIONS  Remove items at home which could result in a fall. This includes throw rugs or furniture in walking pathways.  ICE to the affected knee every three hours for 30 minutes at a time and then as needed for pain and swelling.  Continue to use ice on the knee for pain and swelling from surgery. You may notice swelling that will progress down to the foot and ankle.  This is normal after surgery.  Elevate the leg when you are not up walking on it.   Continue to use the breathing machine which will help keep your temperature down.  It is common for your temperature to cycle up and down following surgery, especially at night when you are not up moving around and exerting yourself.  The breathing machine keeps your lungs expanded and your temperature down. Do not place pillow under knee, focus on keeping the knee straight while resting  DIET You may resume your previous home diet once your are discharged from the hospital.  DRESSING / WOUND CARE / SHOWERING You may start showering once you are discharged home but do not submerge the incision under water. Just pat the incision dry and apply a dry gauze dressing on daily. Change the surgical dressing daily and reapply a dry dressing each time.  ACTIVITY Walk with your walker as instructed. Use walker as long as suggested by your caregivers. Avoid periods of inactivity such as sitting longer than an hour when not asleep. This helps prevent blood clots.  You may resume a sexual relationship in one month or when given the OK by your doctor.  You may return to work once you are cleared by your doctor.  Do not drive a car for 6 weeks or until released by you surgeon.  Do not drive while taking narcotics.  WEIGHT  BEARING Weight bearing as tolerated with assist device (walker, cane, etc) as directed, use it as long as suggested by your surgeon or therapist, typically at least 4-6 weeks.  POSTOPERATIVE CONSTIPATION PROTOCOL Constipation - defined medically as fewer than three stools per week and severe constipation as less than one stool per week.  One of the most common issues patients have following surgery is constipation.  Even if you have a regular bowel pattern at home, your normal regimen is likely to be disrupted due to multiple reasons following surgery.  Combination of anesthesia, postoperative narcotics, change in appetite and fluid intake all can affect your bowels.  In order to avoid complications following surgery, here are some recommendations in order to help you during your recovery period.  Colace (docusate) - Pick up an over-the-counter form of Colace or another stool softener and take twice a day as long as you are requiring postoperative pain medications.  Take with a full glass of water daily.  If you experience loose stools or diarrhea, hold the colace until you stool forms back up.  If your symptoms do not get better within 1 week or if they get worse, check with your doctor.  Dulcolax (bisacodyl) - Pick up over-the-counter and take as directed by the product packaging as needed to assist with the movement of your bowels.  Take with a full glass of water.  Use this product as needed if not relieved by Colace only.   MiraLax (polyethylene glycol) - Pick up over-the-counter to have on hand.  MiraLax is a solution that will increase the amount of water in your bowels to assist with bowel movements.  Take as directed and can mix with a glass of water, juice, soda, coffee, or tea.  Take if you go more than two days without a movement. Do not use MiraLax more than once per day. Call your doctor if you are still constipated or irregular after using this medication for 7 days in a row.  If you  continue to have problems with postoperative constipation, please contact the office for further assistance and recommendations.  If you experience "the worst abdominal pain ever" or develop nausea or vomiting, please contact the office immediatly for further recommendations for treatment.  ITCHING  If you experience itching with your medications, try taking only a single pain pill, or even half a pain pill at a time.  You can also use Benadryl over the counter for itching or also to help with sleep.   TED HOSE STOCKINGS Wear the elastic stockings on both legs for three weeks following  surgery during the day but you may remove then at night for sleeping.  MEDICATIONS See your medication summary on the "After Visit Summary" that the nursing staff will review with you prior to discharge.  You may have some home medications which will be placed on hold until you complete the course of blood thinner medication.  It is important for you to complete the blood thinner medication as prescribed by your surgeon.  Continue your approved medications as instructed at time of discharge.  PRECAUTIONS If you experience chest pain or shortness of breath - call 911 immediately for transfer to the hospital emergency department.  If you develop a fever greater that 101 F, purulent drainage from wound, increased redness or drainage from wound, foul odor from the wound/dressing, or calf pain - CONTACT YOUR SURGEON.                                                   FOLLOW-UP APPOINTMENTS Make sure you keep all of your appointments after your operation with your surgeon and caregivers. You should call the office at the above phone number and make an appointment for approximately two weeks after the date of your surgery or on the date instructed by your surgeon outlined in the "After Visit Summary".   RANGE OF MOTION AND STRENGTHENING EXERCISES  Rehabilitation of the knee is important following a knee injury or an  operation. After just a few days of immobilization, the muscles of the thigh which control the knee become weakened and shrink (atrophy). Knee exercises are designed to build up the tone and strength of the thigh muscles and to improve knee motion. Often times heat used for twenty to thirty minutes before working out will loosen up your tissues and help with improving the range of motion but do not use heat for the first two weeks following surgery. These exercises can be done on a training (exercise) mat, on the floor, on a table or on a bed. Use what ever works the best and is most comfortable for you Knee exercises include:  Leg Lifts - While your knee is still immobilized in a splint or cast, you can do straight leg raises. Lift the leg to 60 degrees, hold for 3 sec, and slowly lower the leg. Repeat 10-20 times 2-3 times daily. Perform this exercise against resistance later as your knee gets better.  Quad and Hamstring Sets - Tighten up the muscle on the front of the thigh (Quad) and hold for 5-10 sec. Repeat this 10-20 times hourly. Hamstring sets are done by pushing the foot backward against an object and holding for 5-10 sec. Repeat as with quad sets.  Leg Slides: Lying on your back, slowly slide your foot toward your buttocks, bending your knee up off the floor (only go as far as is comfortable). Then slowly slide your foot back down until your leg is flat on the floor again. Angel Wings: Lying on your back spread your legs to the side as far apart as you can without causing discomfort.  A rehabilitation program following serious knee injuries can speed recovery and prevent re-injury in the future due to weakened muscles. Contact your doctor or a physical therapist for more information on knee rehabilitation.   IF YOU ARE TRANSFERRED TO A SKILLED REHAB FACILITY If the patient is transferred to a  skilled rehab facility following release from the hospital, a list of the current medications will be sent  to the facility for the patient to continue.  When discharged from the skilled rehab facility, please have the facility set up the patient's [place] prior to being released. Also, the skilled facility will be responsible for providing the patient with their medications at time of release from the facility to include their pain medication, the muscle relaxants, and their blood thinner medication. If the patient is still at the rehab facility at time of the two week follow up appointment, the skilled rehab facility will also need to assist the patient in arranging follow up appointment in our office and any transportation needs.  MAKE SURE YOU:  Understand these instructions.  Get help right away if you are not doing well or get worse.    Pick up stool softner and laxative for home use following surgery while on pain medications. Do not submerge incision under water. Please use good hand washing techniques while changing dressing each day. May shower starting three days after surgery. Please use a clean towel to pat the incision dry following showers. Continue to use ice for pain and swelling after surgery. Do not use any lotions or creams on the incision until instructed by your surgeon.  INSTRUCTIONS AFTER JOINT REPLACEMENT   Remove items at home which could result in a fall. This includes throw rugs or furniture in walking pathways ICE to the affected joint every three hours while awake for 30 minutes at a time, for at least the first 3-5 days, and then as needed for pain and swelling.  Continue to use ice for pain and swelling. You may notice swelling that will progress down to the foot and ankle.  This is normal after surgery.  Elevate your leg when you are not up walking on it.   Continue to use the breathing machine you got in the hospital (incentive spirometer) which will help keep your temperature down.  It is common for your temperature to cycle up and down following  surgery, especially at night when you are not up moving around and exerting yourself.  The breathing machine keeps your lungs expanded and your temperature down.   DIET:  As you were doing prior to hospitalization, we recommend a well-balanced diet.  DRESSING / WOUND CARE / SHOWERING  You may change your dressing every day with sterile gauze.  Please use good hand washing techniques before changing the dressing.  Do not use any lotions or creams on the incision until instructed by your surgeon.  ACTIVITY  Increase activity slowly as tolerated, but follow the weight bearing instructions below.   No driving for 6 weeks or until further direction given by your physician.  You cannot drive while taking narcotics.  No lifting or carrying greater than 10 lbs. until further directed by your surgeon. Avoid periods of inactivity such as sitting longer than an hour when not asleep. This helps prevent blood clots.  You may return to work once you are authorized by your doctor.     WEIGHT BEARING   Weight bearing as tolerated with assist device (walker, cane, etc) as directed, use it as long as suggested by your surgeon or therapist, typically at least 4-6 weeks.   EXERCISES  Results after joint replacement surgery are often greatly improved when you follow the exercise, range of motion and muscle strengthening exercises prescribed by your doctor. Safety measures are also important  to protect the joint from further injury. Any time any of these exercises cause you to have increased pain or swelling, decrease what you are doing until you are comfortable again and then slowly increase them. If you have problems or questions, call your caregiver or physical therapist for advice.   Rehabilitation is important following a joint replacement. After just a few days of immobilization, the muscles of the leg can become weakened and shrink (atrophy).  These exercises are designed to build up the tone and  strength of the thigh and leg muscles and to improve motion. Often times heat used for twenty to thirty minutes before working out will loosen up your tissues and help with improving the range of motion but do not use heat for the first two weeks following surgery (sometimes heat can increase post-operative swelling).   These exercises can be done on a training (exercise) mat, on the floor, on a table or on a bed. Use whatever works the best and is most comfortable for you.    Use music or television while you are exercising so that the exercises are a pleasant break in your day. This will make your life better with the exercises acting as a break in your routine that you can look forward to.   Perform all exercises about fifteen times, three times per day or as directed.  You should exercise both the operative leg and the other leg as well.  Exercises include:   Quad Sets - Tighten up the muscle on the front of the thigh (Quad) and hold for 5-10 seconds.   Straight Leg Raises - With your knee straight (if you were given a brace, keep it on), lift the leg to 60 degrees, hold for 3 seconds, and slowly lower the leg.  Perform this exercise against resistance later as your leg gets stronger.  Leg Slides: Lying on your back, slowly slide your foot toward your buttocks, bending your knee up off the floor (only go as far as is comfortable). Then slowly slide your foot back down until your leg is flat on the floor again.  Angel Wings: Lying on your back spread your legs to the side as far apart as you can without causing discomfort.  Hamstring Strength:  Lying on your back, push your heel against the floor with your leg straight by tightening up the muscles of your buttocks.  Repeat, but this time bend your knee to a comfortable angle, and push your heel against the floor.  You may put a pillow under the heel to make it more comfortable if necessary.   A rehabilitation program following joint replacement  surgery can speed recovery and prevent re-injury in the future due to weakened muscles. Contact your doctor or a physical therapist for more information on knee rehabilitation.    CONSTIPATION  Constipation is defined medically as fewer than three stools per week and severe constipation as less than one stool per week.  Even if you have a regular bowel pattern at home, your normal regimen is likely to be disrupted due to multiple reasons following surgery.  Combination of anesthesia, postoperative narcotics, change in appetite and fluid intake all can affect your bowels.   YOU MUST use at least one of the following options; they are listed in order of increasing strength to get the job done.  They are all available over the counter, and you may need to use some, POSSIBLY even all of these options:    Drink  plenty of fluids (prune juice may be helpful) and high fiber foods Colace 100 mg by mouth twice a day  Senokot for constipation as directed and as needed Dulcolax (bisacodyl), take with full glass of water  Miralax (polyethylene glycol) once or twice a day as needed.  If you have tried all these things and are unable to have a bowel movement in the first 3-4 days after surgery call either your surgeon or your primary doctor.    If you experience loose stools or diarrhea, hold the medications until you stool forms back up.  If your symptoms do not get better within 1 week or if they get worse, check with your doctor.  If you experience "the worst abdominal pain ever" or develop nausea or vomiting, please contact the office immediately for further recommendations for treatment.   ITCHING:  If you experience itching with your medications, try taking only a single pain pill, or even half a pain pill at a time.  You can also use Benadryl over the counter for itching or also to help with sleep.   TED HOSE STOCKINGS:  Use stockings on both legs until for at least 2 weeks or as directed by physician  office. They may be removed at night for sleeping.  MEDICATIONS:  See your medication summary on the "After Visit Summary" that nursing will review with you.  You may have some home medications which will be placed on hold until you complete the course of blood thinner medication.  It is important for you to complete the blood thinner medication as prescribed.  PRECAUTIONS:  If you experience chest pain or shortness of breath - call 911 immediately for transfer to the hospital emergency department.   If you develop a fever greater that 101 F, purulent drainage from wound, increased redness or drainage from wound, foul odor from the wound/dressing, or calf pain - CONTACT YOUR SURGEON.                                                   FOLLOW-UP APPOINTMENTS:  If you do not already have a post-op appointment, please call the office for an appointment to be seen by your surgeon.  Guidelines for how soon to be seen are listed in your "After Visit Summary", but are typically between 1-4 weeks after surgery.  OTHER INSTRUCTIONS:   Knee Replacement:  Do not place pillow under knee, focus on keeping the knee straight while resting. CPM instructions: 0-90 degrees, 2 hours in the morning, 2 hours in the afternoon, and 2 hours in the evening. Place foam block, curve side up under heel at all times except when in CPM or when walking.  DO NOT modify, tear, cut, or change the foam block in any way.  MAKE SURE YOU:  Understand these instructions.  Get help right away if you are not doing well or get worse.    Thank you for letting us be a part of your medical care team.  It is a privilege we respect greatly.  We hope these instructions will help you stay on track for a fast and full recovery!   Increase activity slowly as tolerated    Complete by:  As directed      Allergies as of 10/17/2016      Reactions   Tramadol Itching  Medication List    STOP taking these medications   ALEVE 220 MG  tablet Generic drug:  naproxen sodium   medroxyPROGESTERone 5 MG tablet Commonly known as:  PROVERA   multivitamin with minerals Tabs tablet     TAKE these medications   atorvastatin 20 MG tablet Commonly known as:  LIPITOR Take 20 mg by mouth daily.   benzonatate 100 MG capsule Commonly known as:  TESSALON Take 100 mg by mouth 3 (three) times daily as needed for cough.   [employer] SLOW RELEASE IRON 143 (45 Fe) MG Tbcr Generic drug:  Ferrous Sulfate Take by mouth.   estradiol 0.5 MG tablet Commonly known as:  ESTRACE Take 0.5 mg by mouth 2 (two) times a week. Notes to patient:  Home regimen   losartan-hydrochlorothiazide 100-25 MG tablet Commonly known as:  HYZAAR Take 1 tablet by mouth daily.   metFORMIN 500 MG tablet Commonly known as:  GLUCOPHAGE Take 500 mg by mouth daily. Notes to patient:  Resume 4/6 at home   methocarbamol 500 MG tablet Commonly known as:  ROBAXIN Take 1 tablet (500 mg total) by mouth every 6 (six) hours as needed for muscle spasms.   oxyCODONE 5 MG immediate release tablet Commonly known as:  Oxy IR/ROXICODONE Take 1-3 tablets (5-15 mg total) by mouth every 3 (three) hours as needed for breakthrough pain.   prednisoLONE acetate 1 % ophthalmic suspension Commonly known as:  PRED FORTE Place 1 drop into both eyes See admin instructions. 1 drop into affected eye(s) 4 times a day as needed for episcleritis (inflammation) in eye. Notes to patient:  Home regimen   rivaroxaban 10 MG Tabs tablet Commonly known as:  XARELTO Take 1 tablet (10 mg total) by mouth daily with breakfast.   TYLENOL ARTHRITIS PAIN 650 MG CR tablet Generic drug:  acetaminophen Take 1,300 mg by mouth daily as needed for pain.      Follow-up Information    Gearlean Alf, MD. Schedule an appointment as soon as possible for a visit on 10/29/2016.   Specialty:  Orthopedic Surgery Why:  Call [phone] tomorrow to [facility] appointment for 4/17 Contact information: [address] [place] [address] [phone]        [place] Follow up.   Specialty:  [facility] Contact information: [place] [address] [phone]           Signed: Ardeen Jourdain, PA-C Orthopaedic Surgery 10/21/2016, 8:59 AM

## 2016-10-23 DIAGNOSIS — I129 Hypertensive chronic kidney disease with stage 1 through stage 4 chronic kidney disease, or unspecified chronic kidney disease: Secondary | ICD-10-CM | POA: Diagnosis not present

## 2016-10-23 DIAGNOSIS — Z471 Aftercare following joint replacement surgery: Secondary | ICD-10-CM | POA: Diagnosis not present

## 2016-10-23 DIAGNOSIS — D631 Anemia in chronic kidney disease: Secondary | ICD-10-CM | POA: Diagnosis not present

## 2016-10-23 DIAGNOSIS — E1122 Type 2 diabetes mellitus with diabetic chronic kidney disease: Secondary | ICD-10-CM | POA: Diagnosis not present

## 2016-10-23 DIAGNOSIS — M1712 Unilateral primary osteoarthritis, left knee: Secondary | ICD-10-CM | POA: Diagnosis not present

## 2016-10-23 DIAGNOSIS — N183 Chronic kidney disease, stage 3 (moderate): Secondary | ICD-10-CM | POA: Diagnosis not present

## 2016-10-25 DIAGNOSIS — D631 Anemia in chronic kidney disease: Secondary | ICD-10-CM | POA: Diagnosis not present

## 2016-10-25 DIAGNOSIS — Z471 Aftercare following joint replacement surgery: Secondary | ICD-10-CM | POA: Diagnosis not present

## 2016-10-25 DIAGNOSIS — M1712 Unilateral primary osteoarthritis, left knee: Secondary | ICD-10-CM | POA: Diagnosis not present

## 2016-10-25 DIAGNOSIS — I129 Hypertensive chronic kidney disease with stage 1 through stage 4 chronic kidney disease, or unspecified chronic kidney disease: Secondary | ICD-10-CM | POA: Diagnosis not present

## 2016-10-25 DIAGNOSIS — N183 Chronic kidney disease, stage 3 (moderate): Secondary | ICD-10-CM | POA: Diagnosis not present

## 2016-10-25 DIAGNOSIS — E1122 Type 2 diabetes mellitus with diabetic chronic kidney disease: Secondary | ICD-10-CM | POA: Diagnosis not present

## 2016-10-28 DIAGNOSIS — D631 Anemia in chronic kidney disease: Secondary | ICD-10-CM | POA: Diagnosis not present

## 2016-10-28 DIAGNOSIS — N183 Chronic kidney disease, stage 3 (moderate): Secondary | ICD-10-CM | POA: Diagnosis not present

## 2016-10-28 DIAGNOSIS — E1122 Type 2 diabetes mellitus with diabetic chronic kidney disease: Secondary | ICD-10-CM | POA: Diagnosis not present

## 2016-10-28 DIAGNOSIS — M1712 Unilateral primary osteoarthritis, left knee: Secondary | ICD-10-CM | POA: Diagnosis not present

## 2016-10-28 DIAGNOSIS — Z471 Aftercare following joint replacement surgery: Secondary | ICD-10-CM | POA: Diagnosis not present

## 2016-10-28 DIAGNOSIS — I129 Hypertensive chronic kidney disease with stage 1 through stage 4 chronic kidney disease, or unspecified chronic kidney disease: Secondary | ICD-10-CM | POA: Diagnosis not present

## 2016-10-30 DIAGNOSIS — Z471 Aftercare following joint replacement surgery: Secondary | ICD-10-CM | POA: Diagnosis not present

## 2016-10-30 DIAGNOSIS — I129 Hypertensive chronic kidney disease with stage 1 through stage 4 chronic kidney disease, or unspecified chronic kidney disease: Secondary | ICD-10-CM | POA: Diagnosis not present

## 2016-10-30 DIAGNOSIS — M1712 Unilateral primary osteoarthritis, left knee: Secondary | ICD-10-CM | POA: Diagnosis not present

## 2016-10-30 DIAGNOSIS — D631 Anemia in chronic kidney disease: Secondary | ICD-10-CM | POA: Diagnosis not present

## 2016-10-30 DIAGNOSIS — E1122 Type 2 diabetes mellitus with diabetic chronic kidney disease: Secondary | ICD-10-CM | POA: Diagnosis not present

## 2016-10-30 DIAGNOSIS — N183 Chronic kidney disease, stage 3 (moderate): Secondary | ICD-10-CM | POA: Diagnosis not present

## 2016-11-01 DIAGNOSIS — N183 Chronic kidney disease, stage 3 (moderate): Secondary | ICD-10-CM | POA: Diagnosis not present

## 2016-11-01 DIAGNOSIS — M1712 Unilateral primary osteoarthritis, left knee: Secondary | ICD-10-CM | POA: Diagnosis not present

## 2016-11-01 DIAGNOSIS — Z471 Aftercare following joint replacement surgery: Secondary | ICD-10-CM | POA: Diagnosis not present

## 2016-11-01 DIAGNOSIS — E1122 Type 2 diabetes mellitus with diabetic chronic kidney disease: Secondary | ICD-10-CM | POA: Diagnosis not present

## 2016-11-01 DIAGNOSIS — I129 Hypertensive chronic kidney disease with stage 1 through stage 4 chronic kidney disease, or unspecified chronic kidney disease: Secondary | ICD-10-CM | POA: Diagnosis not present

## 2016-11-01 DIAGNOSIS — D631 Anemia in chronic kidney disease: Secondary | ICD-10-CM | POA: Diagnosis not present

## 2016-11-04 ENCOUNTER — Ambulatory Visit (HOSPITAL_COMMUNITY): Payer: Medicare HMO | Attending: Orthopedic Surgery | Admitting: Physical Therapy

## 2016-11-04 ENCOUNTER — Encounter (HOSPITAL_COMMUNITY): Payer: Self-pay | Admitting: Physical Therapy

## 2016-11-04 DIAGNOSIS — R262 Difficulty in walking, not elsewhere classified: Secondary | ICD-10-CM | POA: Diagnosis not present

## 2016-11-04 DIAGNOSIS — M25561 Pain in right knee: Secondary | ICD-10-CM

## 2016-11-04 DIAGNOSIS — M6281 Muscle weakness (generalized): Secondary | ICD-10-CM | POA: Insufficient documentation

## 2016-11-04 DIAGNOSIS — M25661 Stiffness of right knee, not elsewhere classified: Secondary | ICD-10-CM | POA: Diagnosis not present

## 2016-11-04 DIAGNOSIS — R600 Localized edema: Secondary | ICD-10-CM | POA: Diagnosis not present

## 2016-11-04 NOTE — Patient Instructions (Signed)
   QUAD SET  Tighten your top thigh muscle as you attempt to press the back of your knee downward towards the table.  Hold for 3 seconds and relax.  Repeat 15-20 times with your right leg, at least 3 times per day.     KNEE EXTENSION PRESSURE  Sitting in dining room chair with your leg stretched out like above, put your hands directly above the knee and use your body weight to push down on the knee so you are trying to push it down straight.  Hold for 3-5 seconds and relax.   Repeat 15-20 times at least 3 times per day.      KNEE FLEXION STRETCH - SELF ASSISTED  While seated in a chair, use your unaffected leg to bend your affected knee until a stretch is felt.  Hold for 10 seconds and relax.   Repeat 15-20 times, 3 times per day.

## 2016-11-04 NOTE — Therapy (Signed)
[place] [place], [place], [address] Phone: [phone]   Fax:  [phone]  Physical Therapy Evaluation  Patient Details  Name: Amy Reyes MRN: [id number] Date of Birth: August 07, 1950 Referring Provider: Gaynelle Arabian   Encounter Date: 11/04/2016      PT End of Session - 11/04/16 1223    Visit Number 1   Number of Visits 19   Date for PT Re-Evaluation 11/25/16   Authorization Type [employer] H[employer] (will need KX and G-codes)   Authorization Time Period 11/04/16 to 12/16/16   Authorization - Visit Number 1   Authorization - Number of Visits 10   PT Start Time 1034   PT Stop Time 1115   PT Time Calculation (min) 41 min   Activity Tolerance Patient tolerated treatment well   Behavior During Therapy [facility] for tasks assessed/performed      Past Medical History:  Diagnosis Date  . Anemia    iron defiencey  . Arthritis   . Bronchitis   . Complication of anesthesia    slow to wake up  . Diabetes mellitus without complication ([place])    borderline type 2 on metformin  . History of kidney stones   . Hypercholesteremia   . Hypertension     Past Surgical History:  Procedure Laterality Date  . BREAST SURGERY     reduction  . CHOLECYSTECTOMY    . INJECTION KNEE Left 10/14/2016   Procedure: CORTISONE LEFT KNEE INJECTION;  Surgeon: Gaynelle Arabian, MD;  Location: [facility];  Service: Orthopedics;  Laterality: Left;  . LAPAROSCOPIC GASTRIC BANDING    . TOTAL KNEE ARTHROPLASTY Right 10/14/2016   Procedure: RIGHT TOTAL KNEE ARTHROPLASTY;  Surgeon: Gaynelle Arabian, MD;  Location: [facility];  Service: Orthopedics;  Laterality: Right;  Adductor Block  . tummy tuck      There were no vitals filed for this visit.       Subjective Assessment - 11/04/16 1038    Subjective Patient reports that she is not 100% right now, she feels that the pain medicines may be making her not feel herself. She states she used to be very active before  she started having knee pain and then pain stopped her from regular exercise activites. She had knee replacement surgery on 10/14/16 and got HHPT, has since been discharged. She feels things are going fairly OK after surgery. Bending her knee is hard, getting in and out of car is hard, getting in/out of shower is hard. She can do all of this but it is hard. Her balance has been well, no falls or close calls/stumbles recently.    Pertinent History HTN, DM, no other significant PMH/PSH noted    How long can you sit comfortably? 4/23- this is getting better, maybe 20-30 minutes    How long can you stand comfortably? 4/23- 15 minutes    How long can you walk comfortably? 4/23- 10 minutes    Patient Stated Goals improve walking, walk with upright posture, bend knee more, get back to PLOF    Currently in Pain? Yes   Pain Score 1   took pain pill at 9:30   Pain Location Knee   Pain Orientation Right   Pain Descriptors / Indicators Sore;Tender   Pain Type Acute pain   Pain Radiating Towards none    Pain Onset 1 to 4 weeks ago   Pain Frequency Constant   Aggravating Factors  doing too much   Pain Relieving Factors  pain medicine, shaking leg, rubbing leg, ice    Effect of Pain on Daily Activities severe impact             [facility] PT Assessment - 11/04/16 0001      Assessment   Medical Diagnosis total knee replacement    Referring Provider Pilar Plate Aluisio    Onset Date/Surgical Date 10/14/16   Next MD Visit Dr. Wynelle Link May 8th    Prior Therapy HHPT, now discharged      Precautions   Precautions None     Balance Screen   Has the patient fallen in the past 6 months No   Has the patient had a decrease in activity level because of a fear of falling?  No   Is the patient reluctant to leave their home because of a fear of falling?  No     Prior Function   Level of Independence Independent;Independent with basic ADLs;Independent with gait;Independent with transfers   Vocation Retired   Leisure  travelling     Observation/Other Assessments   Observations incision appears well healing, no signs of infeciton/inflammation noted at eval      AROM   Right Knee Extension 13   Right Knee Flexion 83     Strength   Right Hip Flexion 3/5   Right Hip Extension 3-/5   Right Hip ABduction 3/5   Left Hip Flexion 4/5   Left Hip Extension 3-/5   Left Hip ABduction 4/5   Right Knee Flexion 3/5   Right Knee Extension 4+/5   Left Knee Flexion 4+/5   Left Knee Extension 5/5   Right Ankle Dorsiflexion 5/5   Left Ankle Dorsiflexion 5/5     Ambulation/Gait   Gait Comments flexed at hips, reduced gait speed, reduced TKE R LE      6 minute walk test results    Aerobic Endurance Distance Walked 278   Endurance additional comments 3MWT, SPC      High Level Balance   High Level Balance Comments TUG 18.8 SPC                            PT Education - 11/04/16 1222    Education provided Yes   Education Details examination findings, prognosis, POC, HEP, typical course of recovery for TKR    Person(s) Educated Patient   Methods Explanation;Demonstration;Handout   Comprehension Verbalized understanding;Returned demonstration;Need further instruction          PT Short Term Goals - 11/04/16 1228      PT SHORT TERM GOAL #1   Title Patient to demonstrate surgical knee ROM as being at least 0-110 degrees in order to improve mechanics and reduce pain    Time 3   Period Weeks   Status New     PT SHORT TERM GOAL #2   Title Patient to be independent in scar massage, edema controls strategies, and regular icing in order to facilitate self efficacy in managing condition    Time 3   Period Weeks   Status New     PT SHORT TERM GOAL #3   Title Patient to be able to ambulate without assistive device and with improved gait pattern including full R TKE, equal stance time step/length, improved posture in order to improve community access    Time 3   Period Weeks   Status New      PT SHORT TERM GOAL #4   Title patient to be  independent in consistently performing HEP to be updated PRN    Time 1   Period Weeks   Status New           PT Long Term Goals - 11/04/16 1231      PT LONG TERM GOAL #1   Title Patient to demonstrate functional strength as being 5/5 in order to reduce pain and improve functional task performance/tolerance    Time 6   Period Weeks   Status New     PT LONG TERM GOAL #2   Title patient to be able to reciprocally ascend/descend at least 4 stairs with U railing, good eccentric control, and minimal unsteadiness in order to improve home and community access    Time 6   Period Weeks   Status New     PT LONG TERM GOAL #3   Title Patient to be able to complete TUG in 12 seconds or less and ambulate at least 678ft during 3MWT with no device in order to show reduced fall risk and enhanced mobility    Time 6   Period Weeks   Status New     PT LONG TERM GOAL #4   Title Patient to report she has been able to return to PLOF based activities with no pain exacerbation in order to improve QOL and return to independence with functional tasks    Time 6   Period Weeks   Status New               Plan - 11/04/16 1224    Clinical Impression Statement Patient arrives 3 weeks after right total knee replacement; she reports that things are going well but she is frustrated at not being able to perform tasks at her PLOF as she needs to be quite independent. Examination reveals significant stiffness surgical knee, localized edema, functional weakness, gait deviation, and reduced functional task tolerance. Incision appears very well healing at this time, no signs of infection/inflammation/complication. Recommend skilled PT services in order to address functional deficits and reach optimal level of function.    Rehab Potential Good   Clinical Impairments Affecting Rehab Potential (+) appears motivated to particiapte in skilled PT services, high PLOF    PT Frequency 3x / week   PT Duration 6 weeks   PT Treatment/Interventions ADLs/Self Care Home Management;Biofeedback;Cryotherapy;DME Instruction;Gait training;Stair training;Functional mobility training;Therapeutic activities;Therapeutic exercise;Balance training;Neuromuscular re-education;Patient/family education;Manual techniques;Scar mobilization;Passive range of motion   PT Next Visit Plan review HEP and initial eval/goals; NO CUFF WEIGHTS OR CYBEX UNTIL ROM IS 0-110!! Focus on ROM and edema control first, also gait and balance. Scar mobiltiy, patella mobility, etc. Hip strengthening with ice pack on knee for SLRs, hip ABD, and hip extension.    PT Home Exercise Plan Eval: quad sets, knee extension overpressure, knee flexion stretch    Consulted and Agree with Plan of Care Patient      Patient will benefit from skilled therapeutic intervention in order to improve the following deficits and impairments:  Abnormal gait, Decreased skin integrity, Pain, Decreased coordination, Decreased mobility, Decreased scar mobility, Decreased activity tolerance, Decreased range of motion, Decreased strength, Hypomobility, Decreased balance, Difficulty walking, Increased edema, Impaired flexibility  Visit Diagnosis: Stiffness of right knee, not elsewhere classified - Plan: PT plan of care cert/re-cert  Localized edema - Plan: PT plan of care cert/re-cert  Acute pain of right knee - Plan: PT plan of care cert/re-cert  Muscle weakness (generalized) - Plan: PT plan of care cert/re-cert  Difficulty in walking, not  elsewhere classified - Plan: PT plan of care cert/re-cert      G-Codes - 54/98/26 1233    Functional Assessment Tool Used (Outpatient Only) Based on skilled clinical assessment of ROM, gait, strength, edema, fall risk    Functional Limitation Mobility: Walking and moving around   Mobility: Walking and Moving Around Current Status (E1583) At least 40 percent but less than 60 percent impaired,  limited or restricted   Mobility: Walking and Moving Around Goal Status [phone]) At least 20 percent but less than 40 percent impaired, limited or restricted       Problem List Patient Active Problem List   Diagnosis Date Noted  . OA (osteoarthritis) of knee 10/14/2016    Deniece Ree PT, DPT [place] [address] [place], [place], [address] Phone: [phone]   Fax:  [phone]  Name: LORIELLE BOEHNING MRN: [id number] Date of Birth: 1951-06-22

## 2016-11-06 ENCOUNTER — Ambulatory Visit (HOSPITAL_COMMUNITY): Payer: Medicare HMO

## 2016-11-06 DIAGNOSIS — R600 Localized edema: Secondary | ICD-10-CM

## 2016-11-06 DIAGNOSIS — M25661 Stiffness of right knee, not elsewhere classified: Secondary | ICD-10-CM | POA: Diagnosis not present

## 2016-11-06 DIAGNOSIS — R262 Difficulty in walking, not elsewhere classified: Secondary | ICD-10-CM

## 2016-11-06 DIAGNOSIS — M6281 Muscle weakness (generalized): Secondary | ICD-10-CM | POA: Diagnosis not present

## 2016-11-06 DIAGNOSIS — M25561 Pain in right knee: Secondary | ICD-10-CM

## 2016-11-06 NOTE — Therapy (Signed)
[place], [place], [address] Phone: [phone]   Fax:  [phone]  Physical Therapy Treatment  Patient Details  Name: Amy Reyes MRN: [id number] Date of Birth: 1951/01/03 Referring Provider: Gaynelle Arabian   Encounter Date: 11/06/2016      PT End of Session - 11/06/16 1526    Visit Number 2   Number of Visits 19   Date for PT Re-Evaluation 11/25/16   Authorization Type [employer] HMO (will need KX and G-codes)   Authorization Time Period 11/04/16 to 12/16/16   Authorization - Visit Number 2   Authorization - Number of Visits 10   PT Start Time 1522   PT Stop Time 1615  10 min on ice at EOS   PT Time Calculation (min) 53 min   Activity Tolerance Patient tolerated treatment well   Behavior During Therapy [facility] for tasks assessed/performed      Past Medical History:  Diagnosis Date  . Anemia    iron defiencey  . Arthritis   . Bronchitis   . Complication of anesthesia    slow to wake up  . Diabetes mellitus without complication ([place])    borderline type 2 on metformin  . History of kidney stones   . Hypercholesteremia   . Hypertension     Past Surgical History:  Procedure Laterality Date  . BREAST SURGERY     reduction  . CHOLECYSTECTOMY    . INJECTION KNEE Left 10/14/2016   Procedure: CORTISONE LEFT KNEE INJECTION;  Surgeon: Gaynelle Arabian, MD;  Location: [facility];  Service: Orthopedics;  Laterality: Left;  . LAPAROSCOPIC GASTRIC BANDING    . TOTAL KNEE ARTHROPLASTY Right 10/14/2016   Procedure: RIGHT TOTAL KNEE ARTHROPLASTY;  Surgeon: Gaynelle Arabian, MD;  Location: [facility];  Service: Orthopedics;  Laterality: Right;  Adductor Block  . tummy tuck      There were no vitals filed for this visit.      Subjective Assessment - 11/06/16 1524    Subjective Pt entered dept ambulating with SPC.  Reports compliance wiht HEP daily.  CUrrent pain scale 0/92 unless makes certain movements, mainly stiff  today.   Pertinent History HTN, DM, no other significant PMH/PSH noted    Patient Stated Goals improve walking, walk with upright posture, bend knee more, get back to PLOF    Currently in Pain? Yes   Pain Score 2    Pain Location Knee   Pain Orientation Right   Pain Descriptors / Indicators Tightness  stiffness   Pain Type Acute pain   Pain Radiating Towards none   Pain Onset 1 to 4 weeks ago   Pain Frequency Constant   Aggravating Factors  doing too much   Pain Relieving Factors pain medication, shaking leg, rubbing leg, ice   Effect of Pain on Daily Activities severe impact            [facility] Adult PT Treatment/Exercise - 11/06/16 0001      Exercises   Exercises Knee/Hip     Knee/Hip Exercises: Stretches   Active Hamstring Stretch 3 reps;30 seconds   Active Hamstring Stretch Limitations supine with rope     Knee/Hip Exercises: Supine   Quad Sets 10 reps   Short Arc Quad Sets 15 reps   Heel Slides 10 reps   Heel Slides Limitations 8-93 following manual and stretches   Patellar Mobs complete   Knee Extension Limitations 8   Knee Flexion Limitations 93  Manual Therapy   Manual Therapy Myofascial release;Joint mobilization   Manual therapy comments Manual complete separate rest of tx   Joint Mobilization Patella mobs all directions   Myofascial Release scar tissues                PT Education - 11/06/16 1851    Education provided Yes   Education Details Reviewed goals, assured compliance with HEP, copy of eval given to pt.  Educated technique for scar tissue massage to begin at home.     Person(s) Educated Patient;Other (comment)  pt and sibling   Methods Explanation;Demonstration;Handout   Comprehension Verbalized understanding;Returned demonstration;Need further instruction          PT Short Term Goals - 11/04/16 1228      PT SHORT TERM GOAL #1   Title Patient to demonstrate surgical knee ROM as being at least 0-110 degrees in order to improve  mechanics and reduce pain    Time 3   Period Weeks   Status New     PT SHORT TERM GOAL #2   Title Patient to be independent in scar massage, edema controls strategies, and regular icing in order to facilitate self efficacy in managing condition    Time 3   Period Weeks   Status New     PT SHORT TERM GOAL #3   Title Patient to be able to ambulate without assistive device and with improved gait pattern including full R TKE, equal stance time step/length, improved posture in order to improve community access    Time 3   Period Weeks   Status New     PT SHORT TERM GOAL #4   Title patient to be independent in consistently performing HEP to be updated PRN    Time 1   Period Weeks   Status New           PT Long Term Goals - 11/04/16 1231      PT LONG TERM GOAL #1   Title Patient to demonstrate functional strength as being 5/5 in order to reduce pain and improve functional task performance/tolerance    Time 6   Period Weeks   Status New     PT LONG TERM GOAL #2   Title patient to be able to reciprocally ascend/descend at least 4 stairs with U railing, good eccentric control, and minimal unsteadiness in order to improve home and community access    Time 6   Period Weeks   Status New     PT LONG TERM GOAL #3   Title Patient to be able to complete TUG in 12 seconds or less and ambulate at least 656ft during 3MWT with no device in order to show reduced fall risk and enhanced mobility    Time 6   Period Weeks   Status New     PT LONG TERM GOAL #4   Title Patient to report she has been able to return to PLOF based activities with no pain exacerbation in order to improve QOL and return to independence with functional tasks    Time 6   Period Weeks   Status New               Plan - 11/06/16 1542    Clinical Impression Statement Reviewed goals, assured compliance with HEP and copy of eval given to pt.  Began session with manual techniques to address scar tissue  adhesions and patella mobility.  Pt and her sister educated on techniques to assist  with scar tissue massage at home, verbalized understanding.  Therex focus on ROM based exercises with verbal and tactile cueing to improve quadricep activation.  Reviewed importance of knee mobility especially with extension with gait mechanics.  Improved knee AROM at EOS 8-93 degrees.  Ice applied to knee at EOS for edema and pain control.     Rehab Potential Good   Clinical Impairments Affecting Rehab Potential (+) appears motivated to particiapte in skilled PT services, high PLOF   PT Frequency 3x / week   PT Duration 6 weeks   PT Treatment/Interventions ADLs/Self Care Home Management;Biofeedback;Cryotherapy;DME Instruction;Gait training;Stair training;Functional mobility training;Therapeutic activities;Therapeutic exercise;Balance training;Neuromuscular re-education;Patient/family education;Manual techniques;Scar mobilization;Passive range of motion   PT Next Visit Plan NO CUFF WEIGHTS OR CYBEX UNTIL ROM IS 0-110!! Focus on ROM and edema control first, also gait and balance. Scar mobiltiy, patella mobility, etc. Hip strengthening with ice pack on knee for SLRs, hip ABD, and hip extension.    PT Home Exercise Plan Eval: quad sets, knee extension overpressure, knee flexion stretch       Patient will benefit from skilled therapeutic intervention in order to improve the following deficits and impairments:  Abnormal gait, Decreased skin integrity, Pain, Decreased coordination, Decreased mobility, Decreased scar mobility, Decreased activity tolerance, Decreased range of motion, Decreased strength, Hypomobility, Decreased balance, Difficulty walking, Increased edema, Impaired flexibility  Visit Diagnosis: Stiffness of right knee, not elsewhere classified  Localized edema  Acute pain of right knee  Muscle weakness (generalized)  Difficulty in walking, not elsewhere classified     Problem List Patient Active  Problem List   Diagnosis Date Noted  . OA (osteoarthritis) of knee 10/14/2016   Ihor Austin, LPTA; CBIS [phone]  Aldona Lento 11/06/2016, 6:52 PM  [place] [address] [place], [place], [address] Phone: [phone]   Fax:  [phone]  Name: Amy Reyes MRN: [id number] Date of Birth: 09/16/1950

## 2016-11-08 ENCOUNTER — Ambulatory Visit (HOSPITAL_COMMUNITY): Payer: Medicare HMO

## 2016-11-08 DIAGNOSIS — R262 Difficulty in walking, not elsewhere classified: Secondary | ICD-10-CM | POA: Diagnosis not present

## 2016-11-08 DIAGNOSIS — M25561 Pain in right knee: Secondary | ICD-10-CM

## 2016-11-08 DIAGNOSIS — M25661 Stiffness of right knee, not elsewhere classified: Secondary | ICD-10-CM

## 2016-11-08 DIAGNOSIS — M6281 Muscle weakness (generalized): Secondary | ICD-10-CM

## 2016-11-08 DIAGNOSIS — R600 Localized edema: Secondary | ICD-10-CM | POA: Diagnosis not present

## 2016-11-08 NOTE — Therapy (Signed)
[place], [place], [address] Phone: [phone]   Fax:  [phone]  Physical Therapy Treatment  Patient Details  Name: Amy Reyes MRN: [id number] Date of Birth: 1950-10-27 Referring Provider: Gaynelle Arabian   Encounter Date: 11/08/2016      PT End of Session - 11/08/16 1057    Visit Number 3   Number of Visits 19   Date for PT Re-Evaluation 11/25/16   Authorization Type [employer] HMO (will need KX and G-codes)   Authorization Time Period 11/04/16 to 12/16/16   Authorization - Visit Number 3   Authorization - Number of Visits 10   PT Start Time 1740   PT Stop Time 1113   PT Time Calculation (min) 38 min   Equipment Utilized During Treatment Gait belt   Activity Tolerance Patient tolerated treatment well   Behavior During Therapy [facility] for tasks assessed/performed      Past Medical History:  Diagnosis Date  . Anemia    iron defiencey  . Arthritis   . Bronchitis   . Complication of anesthesia    slow to wake up  . Diabetes mellitus without complication ([place])    borderline type 2 on metformin  . History of kidney stones   . Hypercholesteremia   . Hypertension     Past Surgical History:  Procedure Laterality Date  . BREAST SURGERY     reduction  . CHOLECYSTECTOMY    . INJECTION KNEE Left 10/14/2016   Procedure: CORTISONE LEFT KNEE INJECTION;  Surgeon: Gaynelle Arabian, MD;  Location: [facility];  Service: Orthopedics;  Laterality: Left;  . LAPAROSCOPIC GASTRIC BANDING    . TOTAL KNEE ARTHROPLASTY Right 10/14/2016   Procedure: RIGHT TOTAL KNEE ARTHROPLASTY;  Surgeon: Gaynelle Arabian, MD;  Location: [facility];  Service: Orthopedics;  Laterality: Right;  Adductor Block  . tummy tuck      There were no vitals filed for this visit.      Subjective Assessment - 11/08/16 1044    Subjective Pt reports she is doing ok today, pain aroudn 2/10, but aggravated by stiffness more than anything.Shes be limited in  compliance for HEP but promises to be better with it.    Pertinent History HTN, DM, no other significant PMH/PSH noted    Currently in Pain? Yes   Pain Score 2                          [facility] Adult PT Treatment/Exercise - 11/08/16 0001      Ambulation/Gait   Ambulation Distance (Feet) 450 Feet   Assistive device Straight cane   Gait velocity 1.40m/s     Knee/Hip Exercises: Stretches   Sports administrator Right;3 reps;30 seconds  prone with sheet     Knee/Hip Exercises: Standing   SLS 10x3sec Hold  contrlateral hip flexed to 90 degrees     Knee/Hip Exercises: Supine   Quad Sets 20 reps;Right;AROM  3sH    Short Arc Quad Sets Right;AROM;20 reps  VC for dorsiflexion and TKE effort   Heel Slides 20 reps;Right;AROM  3sH    Straight Leg Raises Right;10 reps;AROM   Knee Extension Limitations 16   Knee Flexion Limitations 94     Knee/Hip Exercises: Prone   Hamstring Curl 2 sets;15 reps                  PT Short Term Goals - 11/04/16 1228  PT SHORT TERM GOAL #1   Title Patient to demonstrate surgical knee ROM as being at least 0-110 degrees in order to improve mechanics and reduce pain    Time 3   Period Weeks   Status New     PT SHORT TERM GOAL #2   Title Patient to be independent in scar massage, edema controls strategies, and regular icing in order to facilitate self efficacy in managing condition    Time 3   Period Weeks   Status New     PT SHORT TERM GOAL #3   Title Patient to be able to ambulate without assistive device and with improved gait pattern including full R TKE, equal stance time step/length, improved posture in order to improve community access    Time 3   Period Weeks   Status New     PT SHORT TERM GOAL #4   Title patient to be independent in consistently performing HEP to be updated PRN    Time 1   Period Weeks   Status New           PT Long Term Goals - 11/04/16 1231      PT LONG TERM GOAL #1   Title Patient to  demonstrate functional strength as being 5/5 in order to reduce pain and improve functional task performance/tolerance    Time 6   Period Weeks   Status New     PT LONG TERM GOAL #2   Title patient to be able to reciprocally ascend/descend at least 4 stairs with U railing, good eccentric control, and minimal unsteadiness in order to improve home and community access    Time 6   Period Weeks   Status New     PT LONG TERM GOAL #3   Title Patient to be able to complete TUG in 12 seconds or less and ambulate at least 647ft during 3MWT with no device in order to show reduced fall risk and enhanced mobility    Time 6   Period Weeks   Status New     PT LONG TERM GOAL #4   Title Patient to report she has been able to return to PLOF based activities with no pain exacerbation in order to improve QOL and return to independence with functional tasks    Time 6   Period Weeks   Status New               Plan - 11/08/16 1059    Clinical Impression Statement Continued with mobility focus, as well as quads activation. The knee remains quite edematous with extension range limitatiosn and pain being worse than flexion. Encouraged pt to improve HEP complaince. Making good progress toward all goals overall.    Rehab Potential Good   Clinical Impairments Affecting Rehab Potential (+) appears motivated to particiapte in skilled PT services, high PLOF   PT Frequency 3x / week   PT Treatment/Interventions ADLs/Self Care Home Management;Biofeedback;Cryotherapy;DME Instruction;Gait training;Stair training;Functional mobility training;Therapeutic activities;Therapeutic exercise;Balance training;Neuromuscular re-education;Patient/family education;Manual techniques;Scar mobilization;Passive range of motion   PT Next Visit Plan NO CUFF WEIGHTS OR CYBEX UNTIL ROM IS 0-110!!!! Focus on ROM and edema control first, also gait and balance.   PT Home Exercise Plan Eval: quad sets, knee extension overpressure, knee  flexion stretch    Consulted and Agree with Plan of Care Patient      Patient will benefit from skilled therapeutic intervention in order to improve the following deficits and impairments:  Abnormal gait, Decreased skin  integrity, Pain, Decreased coordination, Decreased mobility, Decreased scar mobility, Decreased activity tolerance, Decreased range of motion, Decreased strength, Hypomobility, Decreased balance, Difficulty walking, Increased edema, Impaired flexibility  Visit Diagnosis: Stiffness of right knee, not elsewhere classified  Localized edema  Acute pain of right knee  Muscle weakness (generalized)  Difficulty in walking, not elsewhere classified     Problem List Patient Active Problem List   Diagnosis Date Noted  . OA (osteoarthritis) of knee 10/14/2016   11:10 AM, 11/08/16 Etta Grandchild, PT, DPT Physical Therapist at [place] [phone] (office)     [place] [address] [place], [place], [address] Phone: [phone]   Fax:  [phone]  Name: Amy Reyes MRN: [id number] Date of Birth: Nov 23, 1950

## 2016-11-11 ENCOUNTER — Ambulatory Visit (HOSPITAL_COMMUNITY): Payer: Medicare HMO | Admitting: Physical Therapy

## 2016-11-11 DIAGNOSIS — R600 Localized edema: Secondary | ICD-10-CM

## 2016-11-11 DIAGNOSIS — M25561 Pain in right knee: Secondary | ICD-10-CM

## 2016-11-11 DIAGNOSIS — M25661 Stiffness of right knee, not elsewhere classified: Secondary | ICD-10-CM | POA: Diagnosis not present

## 2016-11-11 DIAGNOSIS — M6281 Muscle weakness (generalized): Secondary | ICD-10-CM

## 2016-11-11 DIAGNOSIS — R262 Difficulty in walking, not elsewhere classified: Secondary | ICD-10-CM

## 2016-11-11 NOTE — Therapy (Signed)
[place], [place], [address] Phone: [phone]   Fax:  [phone]  Physical Therapy Treatment  Patient Details  Name: Amy Reyes MRN: [id number] Date of Birth: 1950/09/28 Referring Provider: Gaynelle Arabian   Encounter Date: 11/11/2016      PT End of Session - 11/11/16 1454    Visit Number 4   Number of Visits 19   Date for PT Re-Evaluation 11/25/16   Authorization Type [employer] HMO (will need KX and G-codes)   Authorization Time Period 11/04/16 to 12/16/16   Authorization - Visit Number 4   Authorization - Number of Visits 10   PT Start Time 5597  pt arrived late to appt   PT Stop Time 1128  last 10 minutes ice, not billable   PT Time Calculation (min) 48 min   Equipment Utilized During Treatment Gait belt   Activity Tolerance Patient tolerated treatment well   Behavior During Therapy [facility] for tasks assessed/performed      Past Medical History:  Diagnosis Date  . Anemia    iron defiencey  . Arthritis   . Bronchitis   . Complication of anesthesia    slow to wake up  . Diabetes mellitus without complication ([place])    borderline type 2 on metformin  . History of kidney stones   . Hypercholesteremia   . Hypertension     Past Surgical History:  Procedure Laterality Date  . BREAST SURGERY     reduction  . CHOLECYSTECTOMY    . INJECTION KNEE Left 10/14/2016   Procedure: CORTISONE LEFT KNEE INJECTION;  Surgeon: Gaynelle Arabian, MD;  Location: [facility];  Service: Orthopedics;  Laterality: Left;  . LAPAROSCOPIC GASTRIC BANDING    . TOTAL KNEE ARTHROPLASTY Right 10/14/2016   Procedure: RIGHT TOTAL KNEE ARTHROPLASTY;  Surgeon: Gaynelle Arabian, MD;  Location: [facility];  Service: Orthopedics;  Laterality: Right;  Adductor Block  . tummy tuck      There were no vitals filed for this visit.      Subjective Assessment - 11/11/16 1042    Subjective Pt states the nagging pain and stiffness is the worse.   Currently 2/10 but increases with activity.   Currently in Pain? Yes   Pain Score 2    Pain Location Knee   Pain Orientation Right   Pain Descriptors / Indicators Tightness                         [facility] Adult PT Treatment/Exercise - 11/11/16 0001      Ambulation/Gait   Ambulation Distance (Feet) 452 Feet  completed in 155 seconds   Assistive device None   Gait velocity .89 m/sec   Gait Comments worked on Aeronautical engineer, heel toe gait and increasing weight bearing time on Rt without use of AD     Knee/Hip Exercises: Stretches   Knee: Self-Stretch to increase Flexion Right;10 seconds   Knee: Self-Stretch Limitations 10 reps     Knee/Hip Exercises: Supine   Quad Sets 20 reps;Right;AROM   Short Arc Quad Sets Right;AROM;20 reps   Heel Slides 20 reps;Right;AROM   [place] Limitations 10 reps   Straight Leg Raises 10 reps   Knee Extension Limitations 5   Knee Flexion Limitations 95     Knee/Hip Exercises: Prone   Hamstring Curl 2 sets;15 reps   Contract/Relax to Increase Flexion 3 reps     Modalities   Modalities Cryotherapy  Cryotherapy   Number Minutes Cryotherapy 10 Minutes   Cryotherapy Location Knee   Type of Cryotherapy Ice pack  not billable     Manual Therapy   Manual Therapy Myofascial release;Joint mobilization   Manual therapy comments Manual complete separate rest of tx   Joint Mobilization Patella mobs all directions   Myofascial Release scar tissue and adhesions perimeter of knee and scar                  PT Short Term Goals - 11/04/16 1228      PT SHORT TERM GOAL #1   Title Patient to demonstrate surgical knee ROM as being at least 0-110 degrees in order to improve mechanics and reduce pain    Time 3   Period Weeks   Status New     PT SHORT TERM GOAL #2   Title Patient to be independent in scar massage, edema controls strategies, and regular icing in order to facilitate self efficacy in managing condition    Time 3    Period Weeks   Status New     PT SHORT TERM GOAL #3   Title Patient to be able to ambulate without assistive device and with improved gait pattern including full R TKE, equal stance time step/length, improved posture in order to improve community access    Time 3   Period Weeks   Status New     PT SHORT TERM GOAL #4   Title patient to be independent in consistently performing HEP to be updated PRN    Time 1   Period Weeks   Status New           PT Long Term Goals - 11/04/16 1231      PT LONG TERM GOAL #1   Title Patient to demonstrate functional strength as being 5/5 in order to reduce pain and improve functional task performance/tolerance    Time 6   Period Weeks   Status New     PT LONG TERM GOAL #2   Title patient to be able to reciprocally ascend/descend at least 4 stairs with U railing, good eccentric control, and minimal unsteadiness in order to improve home and community access    Time 6   Period Weeks   Status New     PT LONG TERM GOAL #3   Title Patient to be able to complete TUG in 12 seconds or less and ambulate at least 612ft during 3MWT with no device in order to show reduced fall risk and enhanced mobility    Time 6   Period Weeks   Status New     PT LONG TERM GOAL #4   Title Patient to report she has been able to return to PLOF based activities with no pain exacerbation in order to improve QOL and return to independence with functional tasks    Time 6   Period Weeks   Status New               Plan - 11/11/16 1454    Clinical Impression Statement continued focus on mobility and controlling pain/stiffness.  Pt with increased discomfort during myofascial increasing pain to 7/10.  Pain returned to 2/10 at end of session.  Pt with considerable amount of scar tissue and adhesions in scar line and perimeter.  Able to loosen this with manual and achieve 5-95 degrees following.  Completed ambulation without AD this session, howeer with slower velocity  under 1 m/sec.  Completed session with  10 minutes of ice (not billable)   Rehab Potential Good   Clinical Impairments Affecting Rehab Potential (+) appears motivated to particiapte in skilled PT services, high PLOF   PT Frequency 3x / week   PT Treatment/Interventions ADLs/Self Care Home Management;Biofeedback;Cryotherapy;DME Instruction;Gait training;Stair training;Functional mobility training;Therapeutic activities;Therapeutic exercise;Balance training;Neuromuscular re-education;Patient/family education;Manual techniques;Scar mobilization;Passive range of motion   PT Next Visit Plan NO CUFF WEIGHTS OR CYBEX UNTIL ROM IS 0-110!!!! Focus on ROM and edema control first, also gait and balance.   PT Home Exercise Plan Eval: quad sets, knee extension overpressure, knee flexion stretch    Consulted and Agree with Plan of Care Patient      Patient will benefit from skilled therapeutic intervention in order to improve the following deficits and impairments:  Abnormal gait, Decreased skin integrity, Pain, Decreased coordination, Decreased mobility, Decreased scar mobility, Decreased activity tolerance, Decreased range of motion, Decreased strength, Hypomobility, Decreased balance, Difficulty walking, Increased edema, Impaired flexibility  Visit Diagnosis: Stiffness of right knee, not elsewhere classified  Acute pain of right knee  Muscle weakness (generalized)  Localized edema  Difficulty in walking, not elsewhere classified     Problem List Patient Active Problem List   Diagnosis Date Noted  . OA (osteoarthritis) of knee 10/14/2016    Teena Irani, PTA/CLT [phone]  11/11/2016, 3:08 PM  [facility] [address] [place], [place], [address] Phone: [phone]   Fax:  [phone]  Name: Amy Reyes MRN: [id number] Date of Birth: 11-Dec-1950

## 2016-11-13 ENCOUNTER — Ambulatory Visit (HOSPITAL_COMMUNITY): Payer: Medicare HMO | Attending: Orthopedic Surgery

## 2016-11-13 DIAGNOSIS — M6281 Muscle weakness (generalized): Secondary | ICD-10-CM

## 2016-11-13 DIAGNOSIS — R262 Difficulty in walking, not elsewhere classified: Secondary | ICD-10-CM | POA: Diagnosis not present

## 2016-11-13 DIAGNOSIS — M25561 Pain in right knee: Secondary | ICD-10-CM

## 2016-11-13 DIAGNOSIS — M25661 Stiffness of right knee, not elsewhere classified: Secondary | ICD-10-CM | POA: Diagnosis not present

## 2016-11-13 DIAGNOSIS — R600 Localized edema: Secondary | ICD-10-CM | POA: Diagnosis not present

## 2016-11-13 NOTE — Therapy (Signed)
[place], [place], [address] Phone: [phone]   Fax:  [phone]  Physical Therapy Treatment  Patient Details  Name: Amy Reyes MRN: [id number] Date of Birth: Jun 02, 1951 Referring Provider: Gaynelle Arabian   Encounter Date: 11/13/2016      PT End of Session - 11/13/16 1143    Visit Number 5   Number of Visits 19   Date for PT Re-Evaluation 11/25/16   Authorization Type [employer] H[employer] (will need KX and G-codes)   Authorization Time Period 11/04/16 to 12/16/16   Authorization - Visit Number 5   Authorization - Number of Visits 10   PT Start Time 1119   PT Stop Time 5053  ice for 10' at EOS, no charge   PT Time Calculation (min) 54 min   Activity Tolerance Patient tolerated treatment well;No increased pain   Behavior During Therapy WFL for tasks assessed/performed      Past Medical History:  Diagnosis Date  . Anemia    iron defiencey  . Arthritis   . Bronchitis   . Complication of anesthesia    slow to wake up  . Diabetes mellitus without complication ([place])    borderline type 2 on metformin  . History of kidney stones   . Hypercholesteremia   . Hypertension     Past Surgical History:  Procedure Laterality Date  . BREAST SURGERY     reduction  . CHOLECYSTECTOMY    . INJECTION KNEE Left 10/14/2016   Procedure: CORTISONE LEFT KNEE INJECTION;  Surgeon: Gaynelle Arabian, MD;  Location: [facility];  Service: Orthopedics;  Laterality: Left;  . LAPAROSCOPIC GASTRIC BANDING    . TOTAL KNEE ARTHROPLASTY Right 10/14/2016   Procedure: RIGHT TOTAL KNEE ARTHROPLASTY;  Surgeon: Gaynelle Arabian, MD;  Location: [facility];  Service: Orthopedics;  Laterality: Right;  Adductor Block  . tummy tuck      There were no vitals filed for this visit.      Subjective Assessment - 11/13/16 1119    Subjective Pt stated knee was stiff and increased soreness this morning required pain medication.  Currently pain free.  Noted  dull discomfort on anterior hip   Pertinent History HTN, DM, no other significant PMH/PSH noted    Patient Stated Goals improve walking, walk with upright posture, bend knee more, get back to PLOF    Currently in Pain? No/denies  pain free with medication   Pain Descriptors / Indicators Tightness              [facility] Adult PT Treatment/Exercise - 11/13/16 0001      Ambulation/Gait   Ambulation Distance (Feet) 452 Feet   Assistive device None   Gait Comments worked on gait mechanics, heel toe gait and increasing weight bearing time on Rt without use of AD     Knee/Hip Exercises: Stretches   Active Hamstring Stretch 3 reps;30 seconds   Active Hamstring Stretch Limitations supine with rope   Knee: Self-Stretch to increase Flexion Right;10 seconds   Knee: Self-Stretch Limitations 10 reps     Knee/Hip Exercises: Standing   Rocker Board 2 minutes   Rocker Board Limitations R/L for equal weight distribution     Knee/Hip Exercises: Supine   Quad Sets 20 reps;Right;AROM   Short Arc Quad Sets Right;AROM;20 reps   Heel Slides 2 sets;10 reps   Heel Slides Limitations 5-100 degrees   Bridges Limitations 10   Straight Leg Raises 10 reps   Knee Extension  Limitations 5   Knee Flexion Limitations 100     Knee/Hip Exercises: Prone   Hamstring Curl 15 reps   Contract/Relax to Increase Flexion 3 reps     Modalities   Modalities Cryotherapy     Cryotherapy   Number Minutes Cryotherapy 10 Minutes   Cryotherapy Location Knee   Type of Cryotherapy Ice pack     Manual Therapy   Manual Therapy Myofascial release;Joint mobilization   Manual therapy comments Manual complete separate rest of tx   Joint Mobilization Patella mobs all directions   Myofascial Release scar tissue and adhesions perimeter of knee and scar                  PT Short Term Goals - 11/04/16 1228      PT SHORT TERM GOAL #1   Title Patient to demonstrate surgical knee ROM as being at least 0-110 degrees  in order to improve mechanics and reduce pain    Time 3   Period Weeks   Status New     PT SHORT TERM GOAL #2   Title Patient to be independent in scar massage, edema controls strategies, and regular icing in order to facilitate self efficacy in managing condition    Time 3   Period Weeks   Status New     PT SHORT TERM GOAL #3   Title Patient to be able to ambulate without assistive device and with improved gait pattern including full R TKE, equal stance time step/length, improved posture in order to improve community access    Time 3   Period Weeks   Status New     PT SHORT TERM GOAL #4   Title patient to be independent in consistently performing HEP to be updated PRN    Time 1   Period Weeks   Status New           PT Long Term Goals - 11/04/16 1231      PT LONG TERM GOAL #1   Title Patient to demonstrate functional strength as being 5/5 in order to reduce pain and improve functional task performance/tolerance    Time 6   Period Weeks   Status New     PT LONG TERM GOAL #2   Title patient to be able to reciprocally ascend/descend at least 4 stairs with U railing, good eccentric control, and minimal unsteadiness in order to improve home and community access    Time 6   Period Weeks   Status New     PT LONG TERM GOAL #3   Title Patient to be able to complete TUG in 12 seconds or less and ambulate at least 610ft during 3MWT with no device in order to show reduced fall risk and enhanced mobility    Time 6   Period Weeks   Status New     PT LONG TERM GOAL #4   Title Patient to report she has been able to return to PLOF based activities with no pain exacerbation in order to improve QOL and return to independence with functional tasks    Time 6   Period Weeks   Status New               Plan - 11/13/16 1205    Clinical Impression Statement Session focus on knee mobilty and improving gait mechanics.  Pt with reports of new discomfort on Lt anterior hip while  standing, educated on importance of proper gait mechanics to reduce antalgic mechanics,  verbal cueing and demonstration to improve heel strike for knee extension and equalize stance phase.  Added rocker board to equalize weight distribution.  Continued with manual myofacial release technqiues to reduce scar tissue adhesions in scar line and perimeter.  Improved patella mobility noted.  AROM 5-100 degrees following manual and therex.  Ice applied to knee at EOS for pain and edema control.     Rehab Potential Good   Clinical Impairments Affecting Rehab Potential (+) appears motivated to particiapte in skilled PT services, high PLOF   PT Frequency 3x / week   PT Duration 6 weeks   PT Treatment/Interventions ADLs/Self Care Home Management;Biofeedback;Cryotherapy;DME Instruction;Gait training;Stair training;Functional mobility training;Therapeutic activities;Therapeutic exercise;Balance training;Neuromuscular re-education;Patient/family education;Manual techniques;Scar mobilization;Passive range of motion   PT Next Visit Plan NO CUFF WEIGHTS OR CYBEX UNTIL ROM IS 0-110!!!! Focus on ROM and edema control first, also gait and balance.   PT Home Exercise Plan Eval: quad sets, knee extension overpressure, knee flexion stretch       Patient will benefit from skilled therapeutic intervention in order to improve the following deficits and impairments:  Abnormal gait, Decreased skin integrity, Pain, Decreased coordination, Decreased mobility, Decreased scar mobility, Decreased activity tolerance, Decreased range of motion, Decreased strength, Hypomobility, Decreased balance, Difficulty walking, Increased edema, Impaired flexibility  Visit Diagnosis: Stiffness of right knee, not elsewhere classified  Acute pain of right knee  Muscle weakness (generalized)  Localized edema  Difficulty in walking, not elsewhere classified     Problem List Patient Active Problem List   Diagnosis Date Noted  . OA  (osteoarthritis) of knee 10/14/2016   Ihor Austin, LPTA; CBIS [phone]  Aldona Lento 11/13/2016, 12:22 PM  [place], [place], [address] Phone: [phone]   Fax:  [phone]  Name: Amy Reyes MRN: [id number] Date of Birth: 04-09-1951

## 2016-11-15 ENCOUNTER — Ambulatory Visit (HOSPITAL_COMMUNITY): Payer: Medicare HMO | Admitting: Physical Therapy

## 2016-11-15 DIAGNOSIS — M25661 Stiffness of right knee, not elsewhere classified: Secondary | ICD-10-CM

## 2016-11-15 DIAGNOSIS — M25561 Pain in right knee: Secondary | ICD-10-CM

## 2016-11-15 DIAGNOSIS — M6281 Muscle weakness (generalized): Secondary | ICD-10-CM | POA: Diagnosis not present

## 2016-11-15 DIAGNOSIS — R600 Localized edema: Secondary | ICD-10-CM | POA: Diagnosis not present

## 2016-11-15 DIAGNOSIS — R262 Difficulty in walking, not elsewhere classified: Secondary | ICD-10-CM | POA: Diagnosis not present

## 2016-11-15 NOTE — Therapy (Signed)
[place], [place], [address] Phone: [phone]   Fax:  [phone]  Physical Therapy Treatment  Patient Details  Name: Amy Reyes MRN: [id number] Date of Birth: June 07, 1951 Referring Provider: Gaynelle Arabian   Encounter Date: 11/15/2016      PT End of Session - 11/15/16 1216    Visit Number 6   Number of Visits 19   Date for PT Re-Evaluation 11/25/16   Authorization Type [employer] H[employer] (will need KX and G-codes)   Authorization Time Period 11/04/16 to 12/16/16   Authorization - Visit Number 6   Authorization - Number of Visits 10   PT Start Time 6720   PT Stop Time 9470   PT Time Calculation (min) 41 min   Activity Tolerance Patient tolerated treatment well;No increased pain   Behavior During Therapy WFL for tasks assessed/performed      Past Medical History:  Diagnosis Date  . Anemia    iron defiencey  . Arthritis   . Bronchitis   . Complication of anesthesia    slow to wake up  . Diabetes mellitus without complication ([place])    borderline type 2 on metformin  . History of kidney stones   . Hypercholesteremia   . Hypertension     Past Surgical History:  Procedure Laterality Date  . BREAST SURGERY     reduction  . CHOLECYSTECTOMY    . INJECTION KNEE Left 10/14/2016   Procedure: CORTISONE LEFT KNEE INJECTION;  Surgeon: Gaynelle Arabian, MD;  Location: [facility];  Service: Orthopedics;  Laterality: Left;  . LAPAROSCOPIC GASTRIC BANDING    . TOTAL KNEE ARTHROPLASTY Right 10/14/2016   Procedure: RIGHT TOTAL KNEE ARTHROPLASTY;  Surgeon: Gaynelle Arabian, MD;  Location: [facility];  Service: Orthopedics;  Laterality: Right;  Adductor Block  . tummy tuck      There were no vitals filed for this visit.      Subjective Assessment - 11/15/16 1128    Subjective Pt reports that her knee is mostly just stiff today. She is frustrated that she still has stiffness in her knee. She also wishes she could come to  therapy every day of the week because sometimes it is nice to have the motivation in the clinic.    Pertinent History HTN, DM, no other significant PMH/PSH noted    Patient Stated Goals improve walking, walk with upright posture, bend knee more, get back to PLOF    Currently in Pain? No/denies                         [facility] Adult PT Treatment/Exercise - 11/15/16 0001      Ambulation/Gait   Ambulation Distance (Feet) 225 Feet   Assistive device None   Gait Comments Therapist providing verbal cues for increased Rt knee flexion during swing phase of gait      Knee/Hip Exercises: Stretches   Knee: Self-Stretch to increase Flexion Right;5 reps;10 seconds   Knee: Self-Stretch Limitations seated      Manual Therapy   Manual Therapy Soft tissue mobilization;Joint mobilization   Manual therapy comments separate rest of session    Joint Mobilization Grade II-IV PA proximal Rt fibular mobs    Soft tissue mobilization IASTM along Rt lateral hamstring/quad/adductors (proximal)                 PT Education - 11/15/16 1210    Education provided Yes   Education Details Discussed  importance of equal weight shift to further improve knee strength/ROM; implications of manual techniques to address tightness of the surrounding muscle/fascia; reviewed HEP and encouraged full adherence to allow continued progress; discussed natural progression of rehab following TKA   Person(s) Educated Patient   Methods Explanation;Demonstration;Verbal cues   Comprehension Verbalized understanding;Returned demonstration          PT Short Term Goals - 11/04/16 1228      PT SHORT TERM GOAL #1   Title Patient to demonstrate surgical knee ROM as being at least 0-110 degrees in order to improve mechanics and reduce pain    Time 3   Period Weeks   Status New     PT SHORT TERM GOAL #2   Title Patient to be independent in scar massage, edema controls strategies, and regular icing in order to  facilitate self efficacy in managing condition    Time 3   Period Weeks   Status New     PT SHORT TERM GOAL #3   Title Patient to be able to ambulate without assistive device and with improved gait pattern including full R TKE, equal stance time step/length, improved posture in order to improve community access    Time 3   Period Weeks   Status New     PT SHORT TERM GOAL #4   Title patient to be independent in consistently performing HEP to be updated PRN    Time 1   Period Weeks   Status New           PT Long Term Goals - 11/04/16 1231      PT LONG TERM GOAL #1   Title Patient to demonstrate functional strength as being 5/5 in order to reduce pain and improve functional task performance/tolerance    Time 6   Period Weeks   Status New     PT LONG TERM GOAL #2   Title patient to be able to reciprocally ascend/descend at least 4 stairs with U railing, good eccentric control, and minimal unsteadiness in order to improve home and community access    Time 6   Period Weeks   Status New     PT LONG TERM GOAL #3   Title Patient to be able to complete TUG in 12 seconds or less and ambulate at least 654ft during 3MWT with no device in order to show reduced fall risk and enhanced mobility    Time 6   Period Weeks   Status New     PT LONG TERM GOAL #4   Title Patient to report she has been able to return to PLOF based activities with no pain exacerbation in order to improve QOL and return to independence with functional tasks    Time 6   Period Weeks   Status New               Plan - 11/15/16 1217    Clinical Impression Statement Today's session focused heavily on manual techniques to address soft tissue restrictions throughout the hamstrings/quads/adductors. Therapist provided extensive education to the pt regarding the implications of all techniques and reviewed with her the importance of regular HEP adherence rather than relying solely on her treatment sessions to  make progress. Also addressed pt's technique with sit  to stand secondary to her tendency to shift her weight to the Lt during this activity. She required several visual cues however her technique with much improved by the end of today's session. Pt reporting no increase in  soreness/stiffness following her session.    Rehab Potential Good   Clinical Impairments Affecting Rehab Potential (+) appears motivated to particiapte in skilled PT services, high PLOF   PT Frequency 3x / week   PT Duration 6 weeks   PT Treatment/Interventions ADLs/Self Care Home Management;Biofeedback;Cryotherapy;DME Instruction;Gait training;Stair training;Functional mobility training;Therapeutic activities;Therapeutic exercise;Balance training;Neuromuscular re-education;Patient/family education;Manual techniques;Scar mobilization;Passive range of motion   PT Next Visit Plan focus on sit to stand with equal weight shift to ensure pt has been more aware of this at home; continue with knee ROM exercises and manual techniques along lateral hamstring/quad and adductors to decrease spasm   PT Home Exercise Plan Eval: quad sets, knee extension overpressure, knee flexion stretch    Consulted and Agree with Plan of Care Patient      Patient will benefit from skilled therapeutic intervention in order to improve the following deficits and impairments:  Abnormal gait, Decreased skin integrity, Pain, Decreased coordination, Decreased mobility, Decreased scar mobility, Decreased activity tolerance, Decreased range of motion, Decreased strength, Hypomobility, Decreased balance, Difficulty walking, Increased edema, Impaired flexibility  Visit Diagnosis: Stiffness of right knee, not elsewhere classified  Acute pain of right knee  Muscle weakness (generalized)  Localized edema  Difficulty in walking, not elsewhere classified     Problem List Patient Active Problem List   Diagnosis Date Noted  . OA (osteoarthritis) of knee  10/14/2016    12:30 PM,11/15/16 Elly Modena PT, DPT [facility] [place] [address] [place], [place], [address] Phone: [phone]   Fax:  [phone]  Name: Amy Reyes MRN: [id number] Date of Birth: 11/18/1950

## 2016-11-18 ENCOUNTER — Ambulatory Visit (HOSPITAL_COMMUNITY): Payer: Medicare HMO | Admitting: Physical Therapy

## 2016-11-18 DIAGNOSIS — R262 Difficulty in walking, not elsewhere classified: Secondary | ICD-10-CM

## 2016-11-18 DIAGNOSIS — M25561 Pain in right knee: Secondary | ICD-10-CM

## 2016-11-18 DIAGNOSIS — M25661 Stiffness of right knee, not elsewhere classified: Secondary | ICD-10-CM | POA: Diagnosis not present

## 2016-11-18 DIAGNOSIS — M6281 Muscle weakness (generalized): Secondary | ICD-10-CM | POA: Diagnosis not present

## 2016-11-18 DIAGNOSIS — R600 Localized edema: Secondary | ICD-10-CM

## 2016-11-18 NOTE — Therapy (Signed)
[place], [place], [address] Phone: [phone]   Fax:  [phone]  Physical Therapy Treatment  Patient Details  Name: Amy Reyes MRN: [id number] Date of Birth: December 21, 1950 Referring Provider: Gaynelle Arabian   Encounter Date: 11/18/2016      PT End of Session - 11/18/16 1245    Visit Number 7   Number of Visits 19   Date for PT Re-Evaluation 11/25/16   Authorization Type [employer] HMO (will need KX and G-codes)   Authorization Time Period 11/04/16 to 12/16/16   Authorization - Visit Number 7   Authorization - Number of Visits 10   PT Start Time 1120   PT Stop Time 2563   PT Time Calculation (min) 45 min   Activity Tolerance Patient tolerated treatment well;No increased pain   Behavior During Therapy WFL for tasks assessed/performed      Past Medical History:  Diagnosis Date  . Anemia    iron defiencey  . Arthritis   . Bronchitis   . Complication of anesthesia    slow to wake up  . Diabetes mellitus without complication ([place])    borderline type 2 on metformin  . History of kidney stones   . Hypercholesteremia   . Hypertension     Past Surgical History:  Procedure Laterality Date  . BREAST SURGERY     reduction  . CHOLECYSTECTOMY    . INJECTION KNEE Left 10/14/2016   Procedure: CORTISONE LEFT KNEE INJECTION;  Surgeon: Gaynelle Arabian, MD;  Location: [facility];  Service: Orthopedics;  Laterality: Left;  . LAPAROSCOPIC GASTRIC BANDING    . TOTAL KNEE ARTHROPLASTY Right 10/14/2016   Procedure: RIGHT TOTAL KNEE ARTHROPLASTY;  Surgeon: Gaynelle Arabian, MD;  Location: [facility];  Service: Orthopedics;  Laterality: Right;  Adductor Block  . tummy tuck      There were no vitals filed for this visit.      Subjective Assessment - 11/18/16 1256    Subjective Pt states her knee remains stiff with 1/10 pain. Reports compliance with HEP when she "thinks about it".     Currently in Pain? Yes   Pain Score 1     Pain Location Knee   Pain Orientation Right   Pain Descriptors / Indicators Tightness   Pain Type Acute pain                         [facility] Adult PT Treatment/Exercise - 11/18/16 0001      Knee/Hip Exercises: Stretches   Knee: Self-Stretch to increase Flexion Right;10 seconds   Knee: Self-Stretch Limitations 10 reps on 12" box   Gastroc Stretch Both;3 reps;30 seconds     Knee/Hip Exercises: Supine   Quad Sets 20 reps;Right;AROM   Heel Slides 10 reps   Heel Slides Limitations 5-100   Knee Extension Limitations 5   Knee Flexion Limitations 100     Knee/Hip Exercises: Prone   Hamstring Curl 10 reps     Modalities   Modalities Cryotherapy     Cryotherapy   Number Minutes Cryotherapy 5 Minutes   Cryotherapy Location Knee   Type of Cryotherapy Ice pack     Manual Therapy   Manual Therapy Soft tissue mobilization;Myofascial release   Manual therapy comments separate rest of session    Soft tissue mobilization IASTM along Rt lateral hamstring/quad/adductors (proximal)    Myofascial Release scar tissue and adhesions perimeter of knee and scar  PT Short Term Goals - 11/04/16 1228      PT SHORT TERM GOAL #1   Title Patient to demonstrate surgical knee ROM as being at least 0-110 degrees in order to improve mechanics and reduce pain    Time 3   Period Weeks   Status New     PT SHORT TERM GOAL #2   Title Patient to be independent in scar massage, edema controls strategies, and regular icing in order to facilitate self efficacy in managing condition    Time 3   Period Weeks   Status New     PT SHORT TERM GOAL #3   Title Patient to be able to ambulate without assistive device and with improved gait pattern including full R TKE, equal stance time step/length, improved posture in order to improve community access    Time 3   Period Weeks   Status New     PT SHORT TERM GOAL #4   Title patient to be independent in consistently  performing HEP to be updated PRN    Time 1   Period Weeks   Status New           PT Long Term Goals - 11/04/16 1231      PT LONG TERM GOAL #1   Title Patient to demonstrate functional strength as being 5/5 in order to reduce pain and improve functional task performance/tolerance    Time 6   Period Weeks   Status New     PT LONG TERM GOAL #2   Title patient to be able to reciprocally ascend/descend at least 4 stairs with U railing, good eccentric control, and minimal unsteadiness in order to improve home and community access    Time 6   Period Weeks   Status New     PT LONG TERM GOAL #3   Title Patient to be able to complete TUG in 12 seconds or less and ambulate at least 616ft during 3MWT with no device in order to show reduced fall risk and enhanced mobility    Time 6   Period Weeks   Status New     PT LONG TERM GOAL #4   Title Patient to report she has been able to return to PLOF based activities with no pain exacerbation in order to improve QOL and return to independence with functional tasks    Time 6   Period Weeks   Status New               Plan - 11/18/16 1246    Clinical Impression Statement Continued with primary focus on ROM with manual techiques to reduce restrictions.  Pt still with tenderness, sensitivitiy and restrictions.  states she does her exercises, here and there, when she thinks about it.  counseled on importance of completing these 2X daily.  Continued with manual techniques, specifically myofascial to reduce adhesions and restrictions perimeter of knee.  Pt is sensitive in scar line and requested therapist not complete manual to this area, however did complete wiith less pressure to address scar tissue.  Pt reported overal improvment at end of session.  Requested to have ice on knee for 5 minutes at end of session (not included in billing).   Rehab Potential Good   Clinical Impairments Affecting Rehab Potential (+) appears motivated to  particiapte in skilled PT services, high PLOF   PT Frequency 3x / week   PT Duration 6 weeks   PT Treatment/Interventions ADLs/Self Care Home Management;Biofeedback;Cryotherapy;DME Instruction;Gait training;Stair  training;Functional mobility training;Therapeutic activities;Therapeutic exercise;Balance training;Neuromuscular re-education;Patient/family education;Manual techniques;Scar mobilization;Passive range of motion   PT Next Visit Plan Continue with manual to address restrictions, ROM and functional strengthening progression.  Next session, complete sit to stand with focus on  equal weight shift.  Follow up on HEP compliance.    PT Home Exercise Plan Eval: quad sets, knee extension overpressure, knee flexion stretch    Consulted and Agree with Plan of Care Patient      Patient will benefit from skilled therapeutic intervention in order to improve the following deficits and impairments:  Abnormal gait, Decreased skin integrity, Pain, Decreased coordination, Decreased mobility, Decreased scar mobility, Decreased activity tolerance, Decreased range of motion, Decreased strength, Hypomobility, Decreased balance, Difficulty walking, Increased edema, Impaired flexibility  Visit Diagnosis: Stiffness of right knee, not elsewhere classified  Acute pain of right knee  Muscle weakness (generalized)  Localized edema  Difficulty in walking, not elsewhere classified     Problem List Patient Active Problem List   Diagnosis Date Noted  . OA (osteoarthritis) of knee 10/14/2016    Teena Irani, PTA/CLT [phone]  11/18/2016, 1:01 PM  [facility] [address] [place], [place], [address] Phone: [phone]   Fax:  [phone]  Name: DOTTY GONZALO MRN: [id number] Date of Birth: 06-Jan-1951

## 2016-11-19 DIAGNOSIS — Z471 Aftercare following joint replacement surgery: Secondary | ICD-10-CM | POA: Diagnosis not present

## 2016-11-19 DIAGNOSIS — Z96651 Presence of right artificial knee joint: Secondary | ICD-10-CM | POA: Diagnosis not present

## 2016-11-20 ENCOUNTER — Ambulatory Visit (HOSPITAL_COMMUNITY): Payer: Medicare HMO

## 2016-11-20 DIAGNOSIS — R262 Difficulty in walking, not elsewhere classified: Secondary | ICD-10-CM | POA: Diagnosis not present

## 2016-11-20 DIAGNOSIS — M6281 Muscle weakness (generalized): Secondary | ICD-10-CM | POA: Diagnosis not present

## 2016-11-20 DIAGNOSIS — M25561 Pain in right knee: Secondary | ICD-10-CM | POA: Diagnosis not present

## 2016-11-20 DIAGNOSIS — M25661 Stiffness of right knee, not elsewhere classified: Secondary | ICD-10-CM

## 2016-11-20 DIAGNOSIS — R600 Localized edema: Secondary | ICD-10-CM

## 2016-11-20 NOTE — Therapy (Signed)
[place], [place], [address] Phone: [phone]   Fax:  [phone]  Physical Therapy Treatment  Patient Details  Name: Amy Reyes MRN: [id number] Date of Birth: 08-12-1950 Referring Provider: Gaynelle Arabian   Encounter Date: 11/20/2016      PT End of Session - 11/20/16 1312    Visit Number 8   Number of Visits 19   Date for PT Re-Evaluation 11/25/16   Authorization Type [employer] H[employer] (will need KX and G-codes)   Authorization Time Period 11/04/16 to 12/16/16   Authorization - Visit Number 8   Authorization - Number of Visits 10   PT Start Time 8921   PT Stop Time 1941   PT Time Calculation (min) 45 min   Activity Tolerance Patient tolerated treatment well;No increased pain   Behavior During Therapy WFL for tasks assessed/performed      Past Medical History:  Diagnosis Date  . Anemia    iron defiencey  . Arthritis   . Bronchitis   . Complication of anesthesia    slow to wake up  . Diabetes mellitus without complication ([place])    borderline type 2 on metformin  . History of kidney stones   . Hypercholesteremia   . Hypertension     Past Surgical History:  Procedure Laterality Date  . BREAST SURGERY     reduction  . CHOLECYSTECTOMY    . INJECTION KNEE Left 10/14/2016   Procedure: CORTISONE LEFT KNEE INJECTION;  Surgeon: Gaynelle Arabian, MD;  Location: [facility];  Service: Orthopedics;  Laterality: Left;  . LAPAROSCOPIC GASTRIC BANDING    . TOTAL KNEE ARTHROPLASTY Right 10/14/2016   Procedure: RIGHT TOTAL KNEE ARTHROPLASTY;  Surgeon: Gaynelle Arabian, MD;  Location: [facility];  Service: Orthopedics;  Laterality: Right;  Adductor Block  . tummy tuck      There were no vitals filed for this visit.      Subjective Assessment - 11/20/16 1308    Subjective Pt reoprts increased soreness today.  Went to MD yesterday and reports MD happy with progress.     Pertinent History HTN, DM, no other significant  PMH/PSH noted    Patient Stated Goals improve walking, walk with upright posture, bend knee more, get back to PLOF    Currently in Pain? Yes   Pain Score 5    Pain Location Knee   Pain Orientation Right   Pain Descriptors / Indicators Sore   Pain Type Acute pain   Pain Radiating Towards none   Pain Onset 1 to 4 weeks ago   Pain Frequency Constant   Aggravating Factors  doing too much   Pain Relieving Factors pain medication, shake leg, rubbing leg, ice   Effect of Pain on Daily Activities severe impact                         [facility] Adult PT Treatment/Exercise - 11/20/16 0001      Knee/Hip Exercises: Stretches   Active Hamstring Stretch 3 reps;30 seconds   Active Hamstring Stretch Limitations supine with rope   Quad Stretch Right;3 reps;30 seconds   Quad Stretch Limitations prone with sheet   Knee: Self-Stretch to increase Flexion Right;10 seconds   Knee: Self-Stretch Limitations 10 reps on 12" box   Gastroc Stretch Both;3 reps;30 seconds     Knee/Hip Exercises: Seated   Sit to Sand 10 reps;without UE support  cueing for equal weight bearing and eccentric  control     Knee/Hip Exercises: Supine   Quad Sets 20 reps;Right;AROM   Short Arc Quad Sets Right;AROM;20 reps   Heel Slides 15 reps   Heel Slides Limitations 5-105 degrees   Patellar Mobs complete   Knee Extension Limitations 5   Knee Flexion Limitations AROM 105     Knee/Hip Exercises: Prone   Hamstring Curl 10 reps   Contract/Relax to Increase Flexion 3 reps     Modalities   Modalities Cryotherapy     Cryotherapy   Number Minutes Cryotherapy 5 Minutes   Cryotherapy Location Knee   Type of Cryotherapy Ice pack     Manual Therapy   Manual Therapy Soft tissue mobilization;Myofascial release   Manual therapy comments separate rest of session    Joint Mobilization Patella mobs all direction; tib/fib grade II-IV   Soft tissue mobilization Rt lateral hamstring/quad/adductors (proximal)     Myofascial Release scar tissue and adhesions perimeter of knee and scar                  PT Short Term Goals - 11/04/16 1228      PT SHORT TERM GOAL #1   Title Patient to demonstrate surgical knee ROM as being at least 0-110 degrees in order to improve mechanics and reduce pain    Time 3   Period Weeks   Status New     PT SHORT TERM GOAL #2   Title Patient to be independent in scar massage, edema controls strategies, and regular icing in order to facilitate self efficacy in managing condition    Time 3   Period Weeks   Status New     PT SHORT TERM GOAL #3   Title Patient to be able to ambulate without assistive device and with improved gait pattern including full R TKE, equal stance time step/length, improved posture in order to improve community access    Time 3   Period Weeks   Status New     PT SHORT TERM GOAL #4   Title patient to be independent in consistently performing HEP to be updated PRN    Time 1   Period Weeks   Status New           PT Long Term Goals - 11/04/16 1231      PT LONG TERM GOAL #1   Title Patient to demonstrate functional strength as being 5/5 in order to reduce pain and improve functional task performance/tolerance    Time 6   Period Weeks   Status New     PT LONG TERM GOAL #2   Title patient to be able to reciprocally ascend/descend at least 4 stairs with U railing, good eccentric control, and minimal unsteadiness in order to improve home and community access    Time 6   Period Weeks   Status New     PT LONG TERM GOAL #3   Title Patient to be able to complete TUG in 12 seconds or less and ambulate at least 661ft during 3MWT with no device in order to show reduced fall risk and enhanced mobility    Time 6   Period Weeks   Status New     PT LONG TERM GOAL #4   Title Patient to report she has been able to return to PLOF based activities with no pain exacerbation in order to improve QOL and return to independence with functional  tasks    Time 6   Period Weeks   Status  New               Plan - 11/20/16 1902    Clinical Impression Statement Session focus on improving knee mobilty wiht manual technqiues and therex.  Began session with manual to address edema, myofascial scar tissue adhesions and overall tightness in musculature perimeter of knee.  Pt continues to c/o increased sensitivies to scar line, noted reduced adhesions today.  Improved knee flexion noted following manual, stretches and prone contract relax for flexion with AROM at 5-105 at EOS.  Continues to require tactile cueing to improve quadricep contraction and reduce gluteal activation.  Reviewed importance of adherence with HEP daily, pt verbally understood.  EOS with ice on knee for pain and edema control (not included in billing).    Rehab Potential Good   Clinical Impairments Affecting Rehab Potential (+) appears motivated to particiapte in skilled PT services, high PLOF   PT Frequency 3x / week   PT Duration 6 weeks   PT Treatment/Interventions ADLs/Self Care Home Management;Biofeedback;Cryotherapy;DME Instruction;Gait training;Stair training;Functional mobility training;Therapeutic activities;Therapeutic exercise;Balance training;Neuromuscular re-education;Patient/family education;Manual techniques;Scar mobilization;Passive range of motion   PT Next Visit Plan Continue with manual to address restrictions, ROM and functional strengthening progression.  Next session, complete sit to stand with focus on  equal weight shift.  Follow up on HEP compliance.       Patient will benefit from skilled therapeutic intervention in order to improve the following deficits and impairments:  Abnormal gait, Decreased skin integrity, Pain, Decreased coordination, Decreased mobility, Decreased scar mobility, Decreased activity tolerance, Decreased range of motion, Decreased strength, Hypomobility, Decreased balance, Difficulty walking, Increased edema, Impaired  flexibility  Visit Diagnosis: Stiffness of right knee, not elsewhere classified  Acute pain of right knee  Muscle weakness (generalized)  Localized edema  Difficulty in walking, not elsewhere classified     Problem List Patient Active Problem List   Diagnosis Date Noted  . OA (osteoarthritis) of knee 10/14/2016   Ihor Austin, LPTA; CBIS [phone]  Aldona Lento 11/20/2016, 7:09 PM  [place], [place], [address] Phone: [phone]   Fax:  [phone]  Name: Amy Reyes MRN: [id number] Date of Birth: [birth year]

## 2016-11-22 ENCOUNTER — Ambulatory Visit (HOSPITAL_COMMUNITY): Payer: Medicare HMO

## 2016-11-22 DIAGNOSIS — R262 Difficulty in walking, not elsewhere classified: Secondary | ICD-10-CM | POA: Diagnosis not present

## 2016-11-22 DIAGNOSIS — M25561 Pain in right knee: Secondary | ICD-10-CM

## 2016-11-22 DIAGNOSIS — R600 Localized edema: Secondary | ICD-10-CM

## 2016-11-22 DIAGNOSIS — M6281 Muscle weakness (generalized): Secondary | ICD-10-CM

## 2016-11-22 DIAGNOSIS — M25661 Stiffness of right knee, not elsewhere classified: Secondary | ICD-10-CM

## 2016-11-22 NOTE — Therapy (Signed)
[place], [place], [address] Phone: [phone]   Fax:  [phone]  Physical Therapy Treatment  Patient Details  Name: Amy Reyes MRN: [id number] Date of Birth: 07-06-1951 Referring Provider: Gaynelle Arabian   Encounter Date: 11/22/2016      PT End of Session - 11/22/16 1758    Visit Number 9   Number of Visits 19   Date for PT Re-Evaluation 11/25/16   Authorization Type [employer] H[employer] (will need KX and G-codes)   Authorization Time Period 11/04/16 to 12/16/16   Authorization - Visit Number 9   Authorization - Number of Visits 10   PT Start Time 3710   PT Stop Time 1826   PT Time Calculation (min) 48 min   Equipment Utilized During Treatment Gait belt   Activity Tolerance Patient tolerated treatment well;No increased pain   Behavior During Therapy WFL for tasks assessed/performed      Past Medical History:  Diagnosis Date  . Anemia    iron defiencey  . Arthritis   . Bronchitis   . Complication of anesthesia    slow to wake up  . Diabetes mellitus without complication ([place])    borderline type 2 on metformin  . History of kidney stones   . Hypercholesteremia   . Hypertension     Past Surgical History:  Procedure Laterality Date  . BREAST SURGERY     reduction  . CHOLECYSTECTOMY    . INJECTION KNEE Left 10/14/2016   Procedure: CORTISONE LEFT KNEE INJECTION;  Surgeon: Gaynelle Arabian, MD;  Location: [facility];  Service: Orthopedics;  Laterality: Left;  . LAPAROSCOPIC GASTRIC BANDING    . TOTAL KNEE ARTHROPLASTY Right 10/14/2016   Procedure: RIGHT TOTAL KNEE ARTHROPLASTY;  Surgeon: Gaynelle Arabian, MD;  Location: [facility];  Service: Orthopedics;  Laterality: Right;  Adductor Block  . tummy tuck      There were no vitals filed for this visit.      Subjective Assessment - 11/22/16 1742    Subjective Pt stated she had a great day yesterday with no pain and feels she may have done a little too much  and didn't apply ice with increased swelling today.  Current pain scale 2/10 Rt knee soreness and tightness.   Pertinent History HTN, DM, no other significant PMH/PSH noted    Patient Stated Goals improve walking, walk with upright posture, bend knee more, get back to PLOF    Currently in Pain? Yes   Pain Score 2    Pain Location Knee   Pain Orientation Right   Pain Type Acute pain   Pain Radiating Towards none   Pain Onset 1 to 4 weeks ago   Pain Frequency Constant   Aggravating Factors  doing too much   Pain Relieving Factors pain medication, shake and rub leg, ice   Effect of Pain on Daily Activities severe impact                         [facility] Adult PT Treatment/Exercise - 11/22/16 0001      Knee/Hip Exercises: Stretches   Active Hamstring Stretch 3 reps;30 seconds   Active Hamstring Stretch Limitations supine with rope   Knee: Self-Stretch to increase Flexion Right;10 seconds   Knee: Self-Stretch Limitations 10 reps on 12" box   Gastroc Stretch Both;3 reps;30 seconds   Gastroc Stretch Limitations slant board     Knee/Hip Exercises: Standing   Heel  Raises 10 reps   Heel Raises Limitations heel and toe raises on incline   Terminal Knee Extension Limitations TKE RTB 10x5"     Knee/Hip Exercises: Seated   Sit to Sand 10 reps;without UE support  cueing for equal weight bearing and eccentric control     Knee/Hip Exercises: Supine   Quad Sets 20 reps;Right;AROM   Heel Slides 15 reps   Heel Slides Limitations 5-107   Knee Extension Limitations AROM 5   Knee Flexion Limitations AAROM 109     Knee/Hip Exercises: Prone   Hamstring Curl 2 sets;10 reps     Manual Therapy   Manual Therapy Soft tissue mobilization;Myofascial release;Edema management   Manual therapy comments separate rest of session    Edema Management Retro massage for edema control   Joint Mobilization Patella mobs all direction; tib/fib grade II-IV   Soft tissue mobilization Rt lateral  hamstring/quad/adductors (proximal)    Myofascial Release scar tissue and adhesions perimeter of knee and scar                  PT Short Term Goals - 11/04/16 1228      PT SHORT TERM GOAL #1   Title Patient to demonstrate surgical knee ROM as being at least 0-110 degrees in order to improve mechanics and reduce pain    Time 3   Period Weeks   Status New     PT SHORT TERM GOAL #2   Title Patient to be independent in scar massage, edema controls strategies, and regular icing in order to facilitate self efficacy in managing condition    Time 3   Period Weeks   Status New     PT SHORT TERM GOAL #3   Title Patient to be able to ambulate without assistive device and with improved gait pattern including full R TKE, equal stance time step/length, improved posture in order to improve community access    Time 3   Period Weeks   Status New     PT SHORT TERM GOAL #4   Title patient to be independent in consistently performing HEP to be updated PRN    Time 1   Period Weeks   Status New           PT Long Term Goals - 11/04/16 1231      PT LONG TERM GOAL #1   Title Patient to demonstrate functional strength as being 5/5 in order to reduce pain and improve functional task performance/tolerance    Time 6   Period Weeks   Status New     PT LONG TERM GOAL #2   Title patient to be able to reciprocally ascend/descend at least 4 stairs with U railing, good eccentric control, and minimal unsteadiness in order to improve home and community access    Time 6   Period Weeks   Status New     PT LONG TERM GOAL #3   Title Patient to be able to complete TUG in 12 seconds or less and ambulate at least 632ft during 3MWT with no device in order to show reduced fall risk and enhanced mobility    Time 6   Period Weeks   Status New     PT LONG TERM GOAL #4   Title Patient to report she has been able to return to PLOF based activities with no pain exacerbation in order to improve QOL and  return to independence with functional tasks    Time 6   Period  Weeks   Status New               Plan - 11/22/16 1836    Clinical Impression Statement Continued session focus on AROM with addition of standing exercises to improve gait mechanics.  Added heel and toe raises, STS and standing TKE to POC for more functional ROM based activity.  Knee with increased edema present today following reports of feeling good yesterday with no application of ice or completeing HEP.  Reviewed importance of ice application for edema control and compliance wiht HEP for maximal benefits.  AROM 5-107 degrees, AAROM 109 degrees.  Manual complete for edema control.     Clinical Impairments Affecting Rehab Potential (+) appears motivated to particiapte in skilled PT services, high PLOF   PT Frequency 3x / week   PT Duration 6 weeks   PT Treatment/Interventions ADLs/Self Care Home Management;Biofeedback;Cryotherapy;DME Instruction;Gait training;Stair training;Functional mobility training;Therapeutic activities;Therapeutic exercise;Balance training;Neuromuscular re-education;Patient/family education;Manual techniques;Scar mobilization;Passive range of motion   PT Next Visit Plan Gcode due next session.  Begin prone knee hang next session.  Continue with manual to address restrictions, ROM and functional strengthening progression. Follow up on HEP compliance.       Patient will benefit from skilled therapeutic intervention in order to improve the following deficits and impairments:  Abnormal gait, Decreased skin integrity, Pain, Decreased coordination, Decreased mobility, Decreased scar mobility, Decreased activity tolerance, Decreased range of motion, Decreased strength, Hypomobility, Decreased balance, Difficulty walking, Increased edema, Impaired flexibility  Visit Diagnosis: Stiffness of right knee, not elsewhere classified  Acute pain of right knee  Muscle weakness (generalized)  Localized  edema  Difficulty in walking, not elsewhere classified     Problem List Patient Active Problem List   Diagnosis Date Noted  . OA (osteoarthritis) of knee 10/14/2016   Ihor Austin, LPTA; CBIS [phone]  Aldona Lento 11/22/2016, 6:45 PM  [place] [address] [place], [place], [address] Phone: [phone]   Fax:  [phone]  Name: Amy Reyes MRN: [id number] Date of Birth: [birth year]

## 2016-11-25 ENCOUNTER — Telehealth (HOSPITAL_COMMUNITY): Payer: Self-pay | Admitting: Physical Therapy

## 2016-11-25 ENCOUNTER — Telehealth (HOSPITAL_COMMUNITY): Payer: Self-pay | Admitting: Internal Medicine

## 2016-11-25 ENCOUNTER — Ambulatory Visit (HOSPITAL_COMMUNITY): Payer: Medicare HMO | Admitting: Physical Therapy

## 2016-11-25 NOTE — Telephone Encounter (Signed)
Patient a no-show for today's session; attempted to call patient however phone connection was poor and patient was in a very busy environment, unable to hear or be heard to speak with patient.  Deniece Ree PT, DPT [phone]

## 2016-11-25 NOTE — Telephone Encounter (Signed)
11/25/16 pt left a message to cx but no reason was given

## 2016-11-26 ENCOUNTER — Telehealth (HOSPITAL_COMMUNITY): Payer: Self-pay

## 2016-11-26 ENCOUNTER — Ambulatory Visit (HOSPITAL_COMMUNITY): Payer: Medicare HMO

## 2016-11-26 DIAGNOSIS — R262 Difficulty in walking, not elsewhere classified: Secondary | ICD-10-CM | POA: Diagnosis not present

## 2016-11-26 DIAGNOSIS — R600 Localized edema: Secondary | ICD-10-CM

## 2016-11-26 DIAGNOSIS — M6281 Muscle weakness (generalized): Secondary | ICD-10-CM

## 2016-11-26 DIAGNOSIS — M25561 Pain in right knee: Secondary | ICD-10-CM | POA: Diagnosis not present

## 2016-11-26 DIAGNOSIS — M25661 Stiffness of right knee, not elsewhere classified: Secondary | ICD-10-CM | POA: Diagnosis not present

## 2016-11-26 NOTE — Therapy (Signed)
[place], [place], [address] Phone: [phone]   Fax:  [phone]  Physical Therapy Treatment  Patient Details  Name: Amy Reyes MRN: [id number] Date of Birth: [birth year] Referring Provider: Gaynelle Arabian  Encounter Date: 11/26/2016      PT End of Session - 11/26/16 1424    Visit Number 10   Number of Visits 19   Date for PT Re-Evaluation 12/16/16   Authorization Type [employer] HMO (will need KX and G-codes)   Authorization Time Period 11/04/16 to 12/16/16   Authorization - Visit Number 10   Authorization - Number of Visits 19   PT Start Time 3754   PT Stop Time 3606   PT Time Calculation (min) 45 min   Activity Tolerance Patient tolerated treatment well;No increased pain   Behavior During Therapy WFL for tasks assessed/performed      Past Medical History:  Diagnosis Date  . Anemia    iron defiencey  . Arthritis   . Bronchitis   . Complication of anesthesia    slow to wake up  . Diabetes mellitus without complication ([place])    borderline type 2 on metformin  . History of kidney stones   . Hypercholesteremia   . Hypertension     Past Surgical History:  Procedure Laterality Date  . BREAST SURGERY     reduction  . CHOLECYSTECTOMY    . INJECTION KNEE Left 10/14/2016   Procedure: CORTISONE LEFT KNEE INJECTION;  Surgeon: Gaynelle Arabian, MD;  Location: [facility];  Service: Orthopedics;  Laterality: Left;  . LAPAROSCOPIC GASTRIC BANDING    . TOTAL KNEE ARTHROPLASTY Right 10/14/2016   Procedure: RIGHT TOTAL KNEE ARTHROPLASTY;  Surgeon: Gaynelle Arabian, MD;  Location: [facility];  Service: Orthopedics;  Laterality: Right;  Adductor Block  . tummy tuck      There were no vitals filed for this visit.      Subjective Assessment - 11/26/16 1305    Subjective Pt reports she continues to remain somewhat stiff and feels unable to address this on her own as much as therapy is able to assist. Her pain is  improving gradually. HEP is being performed as tolerated.    Pertinent History HTN, DM, no other significant PMH/PSH noted    How long can you sit comfortably? Estimated to be about 60-90 minutes; (4/23- this is getting better, maybe 20-30 minutes)   How long can you stand comfortably? Estimated to 45 minutes; (4/23- 15 minutes )   How long can you walk comfortably? Pt reports she was able [place] (4/23- 10 minutes )   Patient Stated Goals improve walking, walk with upright posture, bend knee more, get back to PLOF    Currently in Pain? Yes   Pain Score 1    Pain Location --  across the scar and anterior Rt knee   Pain Orientation Right            [facility] PT Assessment - 11/26/16 0001      Assessment   Medical Diagnosis total knee replacement    Referring Provider Pilar Plate Aluisio   Onset Date/Surgical Date 10/14/16   Next MD Visit Dr. Wynelle Link May 8th    Prior Therapy [facility], now discharged      AROM   Right Knee Extension 19  13 degrees at eval 3WA   Right Knee Flexion 109  83 degrees at eval 3WA  Strength   Right Hip Flexion 5/5   Right Hip Extension 4+/5  (Tested in prone)    Right Hip External Rotation  5/5   Right Hip Internal Rotation 5/5   Right Hip ABduction 4/5   Right Hip ADduction 5/5   Left Hip Flexion 5/5   Left Hip Extension 4+/5  tested in prone   Left Hip External Rotation 5/5   Left Hip Internal Rotation 5/5   Left Hip ABduction 4/5   Left Hip ADduction 5/5   Right Knee Flexion 5/5   Right Knee Extension 5/5   Left Knee Flexion 5/5   Left Knee Extension 5/5   Right Ankle Dorsiflexion 5/5   Left Ankle Dorsiflexion 5/5     Ambulation/Gait   Ambulation Distance (Feet) 675 Feet   Assistive device None   Gait velocity 1.38ms   Gait Comments Overall WNL m,echanics, good symmetry, consistent from start to finish.      High Level Balance   High Level Balance Comments TUG 8.71s s AD    TUG 18.8 SPC on  4/23 eval                                PT Short Term Goals - 11/26/16 1337      PT SHORT TERM GOAL #1   Title Patient to demonstrate surgical knee ROM as being at least 0-110 degrees in order to improve mechanics and reduce pain    Baseline 19-109 degrees   Time 3   Period Weeks   Status On-going     PT SHORT TERM GOAL #2   Title Patient to be independent in scar massage, edema controls strategies, and regular icing in order to facilitate self efficacy in managing condition    Time 3   Period Weeks   Status Achieved     PT SHORT TERM GOAL #3   Title Patient to be able to ambulate without assistive device and with improved gait pattern including full R TKE, equal stance time step/length, improved posture in order to improve community access    Baseline Progression of AMB is vastly improved, but pt lacking TKE ROM.    Time 3   Period Weeks   Status Partially Met     PT SHORT TERM GOAL #4   Title patient to be independent in consistently performing HEP to be updated PRN    Time 1   Period Weeks   Status Achieved           PT Long Term Goals - 11/26/16 1317      PT LONG TERM GOAL #1   Title Patient to demonstrate functional strength as being 5/5 in order to reduce pain and improve functional task performance/tolerance    Baseline ABD/Extension still less than 5/5.    Time 6   Period Weeks   Status Partially Met     PT LONG TERM GOAL #2   Title patient to be able to reciprocally ascend/descend at least 4 stairs with U railing, good eccentric control, and minimal unsteadiness in order to improve home and community access    Time 6   Period Weeks   Status Achieved     PT LONG TERM GOAL #3   Title Patient to be able to complete TUG in 12 seconds or less and ambulate at least 6013fduring 3MWT with no device in order to show reduced fall risk and enhanced mobility  Baseline TUG: 8.71s on 12-27-2022   Time 6   Status Achieved     PT LONG TERM GOAL  #4   Title Patient to report she has been able to return to PLOF based activities with no pain exacerbation in order to improve QOL and return to independence with functional tasks    Period Weeks   Status On-going               Plan - 12-26-16 1426    Clinical Impression Statement Reassessment performed today, with great overall progress ofver th elast three week. Most notably improved strength bilat, improved gait tolerance/quality/speed, and improved tolerance to ADL/IADL. Many long term and short term goals achieved. ROM remains limited in extension moreso than flexion but ranging passively 19-109 degrees at today's visit. Pt making excellent progress toward goasl thus far.    Rehab Potential Good   Clinical Impairments Affecting Rehab Potential (+) appears motivated to particiapte in skilled PT services, high PLOF   PT Frequency 3x / week   PT Duration 6 weeks   PT Treatment/Interventions ADLs/Self Care Home Management;Biofeedback;Cryotherapy;DME Instruction;Gait training;Stair training;Functional mobility training;Therapeutic activities;Therapeutic exercise;Balance training;Neuromuscular re-education;Patient/family education;Manual techniques;Scar mobilization;Passive range of motion   PT Next Visit Plan Begin prone knee hang next session.  Continue with manual to address restrictions, ROM and functional strengthening progression. Follow up on HEP compliance.    PT Home Exercise Plan Eval: quad sets, knee extension overpressure, knee flexion stretch    Consulted and Agree with Plan of Care Patient      Patient will benefit from skilled therapeutic intervention in order to improve the following deficits and impairments:  Abnormal gait, Decreased skin integrity, Pain, Decreased coordination, Decreased mobility, Decreased scar mobility, Decreased activity tolerance, Decreased range of motion, Decreased strength, Hypomobility, Decreased balance, Difficulty walking, Increased edema,  Impaired flexibility  Visit Diagnosis: Stiffness of right knee, not elsewhere classified  Acute pain of right knee  Muscle weakness (generalized)  Localized edema  Difficulty in walking, not elsewhere classified       G-Codes - 26-Dec-2016 1431    Functional Assessment Tool Used (Outpatient Only) Based on skilled clinical assessment of ROM, gait, strength, edema, fall risk    Functional Limitation Mobility: Walking and moving around   Mobility: Walking and Moving Around Current Status ([id number]) At least 1 percent but less than 20 percent impaired, limited or restricted   Mobility: Walking and Moving Around Goal Status [phone]) At least 1 percent but less than 20 percent impaired, limited or restricted      Problem List Patient Active Problem List   Diagnosis Date Noted  . OA (osteoarthritis) of knee 10/14/2016   2:32 PM, 12/26/2016 Etta Grandchild, PT, DPT Physical Therapist - [place] [phone] [facility])  [phone] (mobile)    Etta Grandchild 12-26-16, 2:31 PM  [facility] [address] [place], [place], [address] Phone: [phone]   Fax:  [phone]  Name: Amy Reyes MRN: [id number] Date of Birth: October 02, 1950

## 2016-11-26 NOTE — Telephone Encounter (Signed)
patient changed appt time

## 2016-11-27 ENCOUNTER — Ambulatory Visit (HOSPITAL_COMMUNITY): Payer: Medicare HMO | Admitting: Physical Therapy

## 2016-11-27 DIAGNOSIS — M25661 Stiffness of right knee, not elsewhere classified: Secondary | ICD-10-CM

## 2016-11-27 DIAGNOSIS — R600 Localized edema: Secondary | ICD-10-CM

## 2016-11-27 DIAGNOSIS — M25561 Pain in right knee: Secondary | ICD-10-CM | POA: Diagnosis not present

## 2016-11-27 DIAGNOSIS — M6281 Muscle weakness (generalized): Secondary | ICD-10-CM | POA: Diagnosis not present

## 2016-11-27 DIAGNOSIS — R262 Difficulty in walking, not elsewhere classified: Secondary | ICD-10-CM

## 2016-11-27 NOTE — Therapy (Signed)
[place], [place], [address] Phone: [phone]   Fax:  [phone]  Physical Therapy Treatment  Patient Details  Name: Amy Reyes MRN: [id number] Date of Birth: 1950-09-01 Referring Provider: Gaynelle Arabian  Encounter Date: 11/27/2016      PT End of Session - 11/27/16 1202    Visit Number 11   Number of Visits 19   Date for PT Re-Evaluation 12/16/16   Authorization Type [employer] H[employer] (will need KX and G-codes)   Authorization Time Period 11/04/16 to 12/16/16   Authorization - Visit Number 11   Authorization - Number of Visits 19   PT Start Time 1116   PT Stop Time 1200   PT Time Calculation (min) 44 min   Activity Tolerance Patient tolerated treatment well   Behavior During Therapy [facility] for tasks assessed/performed      Past Medical History:  Diagnosis Date  . Anemia    iron defiencey  . Arthritis   . Bronchitis   . Complication of anesthesia    slow to wake up  . Diabetes mellitus without complication ([place])    borderline type 2 on metformin  . History of kidney stones   . Hypercholesteremia   . Hypertension     Past Surgical History:  Procedure Laterality Date  . BREAST SURGERY     reduction  . CHOLECYSTECTOMY    . INJECTION KNEE Left 10/14/2016   Procedure: CORTISONE LEFT KNEE INJECTION;  Surgeon: Gaynelle Arabian, MD;  Location: [facility];  Service: Orthopedics;  Laterality: Left;  . LAPAROSCOPIC GASTRIC BANDING    . TOTAL KNEE ARTHROPLASTY Right 10/14/2016   Procedure: RIGHT TOTAL KNEE ARTHROPLASTY;  Surgeon: Gaynelle Arabian, MD;  Location: [facility];  Service: Orthopedics;  Laterality: Right;  Adductor Block  . tummy tuck      There were no vitals filed for this visit.      Subjective Assessment - 11/27/16 1122    Subjective patient arrives stating she is doing OK, she states her biggest problem is really ROM at this time. She sees her MD in June.    Pertinent History HTN, DM, no other  significant PMH/PSH noted    Patient Stated Goals improve walking, walk with upright posture, bend knee more, get back to PLOF    Currently in Pain? No/denies            [facility] PT Assessment - 11/27/16 0001      AROM   Right Knee Extension 5  end of treatment; was 8 at beginning of session    Right Knee Flexion 110                     [facility] Adult PT Treatment/Exercise - 11/27/16 0001      Knee/Hip Exercises: Stretches   Active Hamstring Stretch Right;3 reps;30 seconds   Active Hamstring Stretch Limitations stairs    Knee: Self-Stretch to increase Flexion Right;10 seconds   Knee: Self-Stretch Limitations x10 on 12 inch box    Gastroc Stretch Both;3 reps;30 seconds   Gastroc Stretch Limitations slantboard      Knee/Hip Exercises: Standing   Rocker Board 2 minutes   Rocker Board Limitations lateral and AP intermittent HHA      Manual Therapy   Manual Therapy Joint mobilization;Soft tissue mobilization   Manual therapy comments separate rest of session    Joint Mobilization patella mobility all directions with R LE out straight for HS stretch  Soft tissue mobilization R distal HS STM medial and lateral heads    Myofascial Release scar tissue work              Balance Exercises - 11/27/16 1152      Balance Exercises: Standing   Tandem Stance Eyes open;Foam/compliant surface;3 reps;10 secs           PT Education - 11/27/16 1202    Education provided No          PT Short Term Goals - 11/26/16 1337      PT SHORT TERM GOAL #1   Title Patient to demonstrate surgical knee ROM as being at least 0-110 degrees in order to improve mechanics and reduce pain    Baseline 19-109 degrees   Time 3   Period Weeks   Status On-going     PT SHORT TERM GOAL #2   Title Patient to be independent in scar massage, edema controls strategies, and regular icing in order to facilitate self efficacy in managing condition    Time 3   Period Weeks   Status Achieved      PT SHORT TERM GOAL #3   Title Patient to be able to ambulate without assistive device and with improved gait pattern including full R TKE, equal stance time step/length, improved posture in order to improve community access    Baseline Progression of AMB is vastly improved, but pt lacking TKE ROM.    Time 3   Period Weeks   Status Partially Met     PT SHORT TERM GOAL #4   Title patient to be independent in consistently performing HEP to be updated PRN    Time 1   Period Weeks   Status Achieved           PT Long Term Goals - 11/26/16 1317      PT LONG TERM GOAL #1   Title Patient to demonstrate functional strength as being 5/5 in order to reduce pain and improve functional task performance/tolerance    Baseline ABD/Extension still less than 5/5.    Time 6   Period Weeks   Status Partially Met     PT LONG TERM GOAL #2   Title patient to be able to reciprocally ascend/descend at least 4 stairs with U railing, good eccentric control, and minimal unsteadiness in order to improve home and community access    Time 6   Period Weeks   Status Achieved     PT LONG TERM GOAL #3   Title Patient to be able to complete TUG in 12 seconds or less and ambulate at least 668f during 3MWT with no device in order to show reduced fall risk and enhanced mobility    Baseline TUG: 8.71s on 5/15   Time 6   Status Achieved     PT LONG TERM GOAL #4   Title Patient to report she has been able to return to PLOF based activities with no pain exacerbation in order to improve QOL and return to independence with functional tasks    Period Weeks   Status On-going               Plan - 11/27/16 1203    Clinical Impression Statement Patient arrives today reporting she is doing well, no changes since session yesterday. Began session with manual to patella, scar, and with STM to B hamstrings in prone, noting excessive HS tightness and suspecting that this limitation may be contributing to lack of  knee extension. Otherwise continued with ROM based activities and exercises as well as functional balance and gait tasks this session. Unsure as to ROM recording of 19 degrees extension obtained last session as she has been consistently able to reach 5-8 degrees extension today and during regular treatment sessions.    Rehab Potential Good   Clinical Impairments Affecting Rehab Potential (+) appears motivated to particiapte in skilled PT services, high PLOF   PT Frequency 3x / week   PT Duration 6 weeks   PT Treatment/Interventions ADLs/Self Care Home Management;Biofeedback;Cryotherapy;DME Instruction;Gait training;Stair training;Functional mobility training;Therapeutic activities;Therapeutic exercise;Balance training;Neuromuscular re-education;Patient/family education;Manual techniques;Scar mobilization;Passive range of motion   PT Next Visit Plan continue increased focus of manual to HS as well as trial of prone knee hangs. Continue with ROM based activites hwoever incorporate more balance activities moving forward.    PT Home Exercise Plan Eval: quad sets, knee extension overpressure, knee flexion stretch    Consulted and Agree with Plan of Care Patient      Patient will benefit from skilled therapeutic intervention in order to improve the following deficits and impairments:  Abnormal gait, Decreased skin integrity, Pain, Decreased coordination, Decreased mobility, Decreased scar mobility, Decreased activity tolerance, Decreased range of motion, Decreased strength, Hypomobility, Decreased balance, Difficulty walking, Increased edema, Impaired flexibility  Visit Diagnosis: Stiffness of right knee, not elsewhere classified  Acute pain of right knee  Muscle weakness (generalized)  Localized edema  Difficulty in walking, not elsewhere classified       G-Codes - 2016/12/03 1431    Functional Assessment Tool Used (Outpatient Only) Based on skilled clinical assessment of ROM, gait, strength,  edema, fall risk    Functional Limitation Mobility: Walking and moving around   Mobility: Walking and Moving Around Current Status ([id number]) At least 1 percent but less than 20 percent impaired, limited or restricted   Mobility: Walking and Moving Around Goal Status [phone]) At least 1 percent but less than 20 percent impaired, limited or restricted      Problem List Patient Active Problem List   Diagnosis Date Noted  . OA (osteoarthritis) of knee 10/14/2016    Deniece Ree PT, DPT [facility] [address] [place], [place], [address] Phone: [phone]   Fax:  [phone]  Name: MARRISA KIMBER MRN: [id number] Date of Birth: 18-Aug-1950

## 2016-11-28 ENCOUNTER — Encounter (HOSPITAL_COMMUNITY): Payer: Medicare HMO

## 2016-11-29 ENCOUNTER — Ambulatory Visit (HOSPITAL_COMMUNITY): Payer: Medicare HMO

## 2016-11-29 DIAGNOSIS — M6281 Muscle weakness (generalized): Secondary | ICD-10-CM | POA: Diagnosis not present

## 2016-11-29 DIAGNOSIS — R262 Difficulty in walking, not elsewhere classified: Secondary | ICD-10-CM | POA: Diagnosis not present

## 2016-11-29 DIAGNOSIS — M25561 Pain in right knee: Secondary | ICD-10-CM | POA: Diagnosis not present

## 2016-11-29 DIAGNOSIS — R600 Localized edema: Secondary | ICD-10-CM | POA: Diagnosis not present

## 2016-11-29 DIAGNOSIS — M25661 Stiffness of right knee, not elsewhere classified: Secondary | ICD-10-CM

## 2016-11-29 NOTE — Therapy (Signed)
[place], [place], [address] Phone: [phone]   Fax:  [phone]  Physical Therapy Treatment  Patient Details  Name: Amy Reyes MRN: [id number] Date of Birth: 1951-02-15 Referring Provider: Gaynelle Arabian  Encounter Date: 11/29/2016      PT End of Session - 11/29/16 1204    Visit Number 12   Number of Visits 19   Date for PT Re-Evaluation 12/16/16   Authorization Type [employer] H[employer] (will need KX and G-codes)   Authorization Time Period 11/04/16 to 12/16/16   Authorization - Visit Number 12   Authorization - Number of Visits 19   PT Start Time 6761  pt arrived late   PT Stop Time 1158   PT Time Calculation (min) 32 min   Equipment Utilized During Treatment Gait belt   Activity Tolerance Patient tolerated treatment well   Behavior During Therapy WFL for tasks assessed/performed      Past Medical History:  Diagnosis Date  . Anemia    iron defiencey  . Arthritis   . Bronchitis   . Complication of anesthesia    slow to wake up  . Diabetes mellitus without complication ([place])    borderline type 2 on metformin  . History of kidney stones   . Hypercholesteremia   . Hypertension     Past Surgical History:  Procedure Laterality Date  . BREAST SURGERY     reduction  . CHOLECYSTECTOMY    . INJECTION KNEE Left 10/14/2016   Procedure: CORTISONE LEFT KNEE INJECTION;  Surgeon: Gaynelle Arabian, MD;  Location: [facility];  Service: Orthopedics;  Laterality: Left;  . LAPAROSCOPIC GASTRIC BANDING    . TOTAL KNEE ARTHROPLASTY Right 10/14/2016   Procedure: RIGHT TOTAL KNEE ARTHROPLASTY;  Surgeon: Gaynelle Arabian, MD;  Location: [facility];  Service: Orthopedics;  Laterality: Right;  Adductor Block  . tummy tuck      There were no vitals filed for this visit.      Subjective Assessment - 11/29/16 1130    Subjective Pt reports she is having some trouble at night with stiffness which seems new to her, but  otherwise feeling good. She has not taken her pain meds today.   Pertinent History HTN, DM, no other significant PMH/PSH noted    Currently in Pain? No/denies                         [facility] Adult PT Treatment/Exercise - 11/29/16 0001      Knee/Hip Exercises: Stretches   Other Knee/Hip Stretches AAROM on NuSTep, Level 2, 5 minutes   Other Knee/Hip Stretches Quadruped quads stretch: 15x3secH      Knee/Hip Exercises: Seated   Long Arc Quad Strengthening;Right;2 sets;15 reps     Knee/Hip Exercises: Supine   Knee Extension Limitations 7 degrees   Knee Flexion Limitations 109 degrees     Knee/Hip Exercises: Prone   Hamstring Curl 4 sets;10 reps   Hamstring Curl Limitations 2lb (see below for detail)    Prone Knee Hang 5 minutes  5x60sec; intermittent prone knee flexion 4x10    Prone Knee Hang Weights (lbs) 2lb             Balance Exercises - 11/29/16 1159      Balance Exercises: Standing   Tandem Stance Eyes open;2 reps;20 secs  1x semitandem trunk rotation x20              PT Short Term  Goals - 11/26/16 1337      PT SHORT TERM GOAL #1   Title Patient to demonstrate surgical knee ROM as being at least 0-110 degrees in order to improve mechanics and reduce pain    Baseline 19-109 degrees   Time 3   Period Weeks   Status On-going     PT SHORT TERM GOAL #2   Title Patient to be independent in scar massage, edema controls strategies, and regular icing in order to facilitate self efficacy in managing condition    Time 3   Period Weeks   Status Achieved     PT SHORT TERM GOAL #3   Title Patient to be able to ambulate without assistive device and with improved gait pattern including full R TKE, equal stance time step/length, improved posture in order to improve community access    Baseline Progression of AMB is vastly improved, but pt lacking TKE ROM.    Time 3   Period Weeks   Status Partially Met     PT SHORT TERM GOAL #4   Title patient to be  independent in consistently performing HEP to be updated PRN    Time 1   Period Weeks   Status Achieved           PT Long Term Goals - 11/26/16 1317      PT LONG TERM GOAL #1   Title Patient to demonstrate functional strength as being 5/5 in order to reduce pain and improve functional task performance/tolerance    Baseline ABD/Extension still less than 5/5.    Time 6   Period Weeks   Status Partially Met     PT LONG TERM GOAL #2   Title patient to be able to reciprocally ascend/descend at least 4 stairs with U railing, good eccentric control, and minimal unsteadiness in order to improve home and community access    Time 6   Period Weeks   Status Achieved     PT LONG TERM GOAL #3   Title Patient to be able to complete TUG in 12 seconds or less and ambulate at least 645f during 3MWT with no device in order to show reduced fall risk and enhanced mobility    Baseline TUG: 8.71s on 5/15   Time 6   Status Achieved     PT LONG TERM GOAL #4   Title Patient to report she has been able to return to PLOF based activities with no pain exacerbation in order to improve QOL and return to independence with functional tasks    Period Weeks   Status On-going               Plan - 11/29/16 1204    Clinical Impression Statement Pt toleration g session well today, hamstrings complete free of tenderness, and extension ROM improved. activation and AROM improved this sesion. Also continued to progress balance activity. Pt making good progress toward goals overall.    Rehab Potential Good   Clinical Impairments Affecting Rehab Potential (+) appears motivated to particiapte in skilled PT services, high PLOF   PT Frequency 3x / week   PT Treatment/Interventions ADLs/Self Care Home Management;Biofeedback;Cryotherapy;DME Instruction;Gait training;Stair training;Functional mobility training;Therapeutic activities;Therapeutic exercise;Balance training;Neuromuscular re-education;Patient/family  education;Manual techniques;Scar mobilization;Passive range of motion   PT Next Visit Plan Continue with prone knee hangs and knee fleixon, progress blaance activity. ROM focus as well. Pt tolerating quadruped knee fleixon stretches well this session.    PT Home Exercise Plan Eval: quad sets, knee extension  overpressure, knee flexion stretch    Consulted and Agree with Plan of Care Patient      Patient will benefit from skilled therapeutic intervention in order to improve the following deficits and impairments:  Abnormal gait, Decreased skin integrity, Pain, Decreased coordination, Decreased mobility, Decreased scar mobility, Decreased activity tolerance, Decreased range of motion, Decreased strength, Hypomobility, Decreased balance, Difficulty walking, Increased edema, Impaired flexibility  Visit Diagnosis: Stiffness of right knee, not elsewhere classified  Acute pain of right knee  Muscle weakness (generalized)  Localized edema  Difficulty in walking, not elsewhere classified     Problem List Patient Active Problem List   Diagnosis Date Noted  . OA (osteoarthritis) of knee 10/14/2016   12:09 PM, 11/29/16 Etta Grandchild, PT, DPT Physical Therapist at [place] [phone] (office)      Etta Grandchild 11/29/2016, 12:09 PM  [place], [place], [address] Phone: [phone]   Fax:  [phone]  Name: AMIRRA HERLING MRN: [id number] Date of Birth: 08/29/1950

## 2016-12-02 ENCOUNTER — Ambulatory Visit (HOSPITAL_COMMUNITY): Payer: Medicare HMO | Admitting: Physical Therapy

## 2016-12-02 DIAGNOSIS — M25561 Pain in right knee: Secondary | ICD-10-CM

## 2016-12-02 DIAGNOSIS — M25661 Stiffness of right knee, not elsewhere classified: Secondary | ICD-10-CM

## 2016-12-02 DIAGNOSIS — R262 Difficulty in walking, not elsewhere classified: Secondary | ICD-10-CM

## 2016-12-02 DIAGNOSIS — R600 Localized edema: Secondary | ICD-10-CM | POA: Diagnosis not present

## 2016-12-02 DIAGNOSIS — M6281 Muscle weakness (generalized): Secondary | ICD-10-CM

## 2016-12-02 NOTE — Therapy (Signed)
[place], [place], [address] Phone: [phone]   Fax:  [phone]  Physical Therapy Treatment  Patient Details  Name: Amy Reyes MRN: [id number] Date of Birth: 05-02-1951 Referring Provider: Gaynelle Arabian  Encounter Date: 12/02/2016      PT End of Session - 12/02/16 1203    Visit Number 13   Number of Visits 19   Date for PT Re-Evaluation 12/16/16   Authorization Type [employer] H[employer] (will need KX and G-codes)   Authorization Time Period 11/04/16 to 12/16/16   Authorization - Visit Number 13   Authorization - Number of Visits 19   PT Start Time 1120   PT Stop Time 1205  5 min untimed for modality    PT Time Calculation (min) 45 min   Activity Tolerance Patient tolerated treatment well;No increased pain   Behavior During Therapy WFL for tasks assessed/performed      Past Medical History:  Diagnosis Date  . Anemia    iron defiencey  . Arthritis   . Bronchitis   . Complication of anesthesia    slow to wake up  . Diabetes mellitus without complication ([place])    borderline type 2 on metformin  . History of kidney stones   . Hypercholesteremia   . Hypertension     Past Surgical History:  Procedure Laterality Date  . BREAST SURGERY     reduction  . CHOLECYSTECTOMY    . INJECTION KNEE Left 10/14/2016   Procedure: CORTISONE LEFT KNEE INJECTION;  Surgeon: Gaynelle Arabian, MD;  Location: [facility];  Service: Orthopedics;  Laterality: Left;  . LAPAROSCOPIC GASTRIC BANDING    . TOTAL KNEE ARTHROPLASTY Right 10/14/2016   Procedure: RIGHT TOTAL KNEE ARTHROPLASTY;  Surgeon: Gaynelle Arabian, MD;  Location: [facility];  Service: Orthopedics;  Laterality: Right;  Adductor Block  . tummy tuck      There were no vitals filed for this visit.      Subjective Assessment - 12/02/16 1122    Subjective Pt reports that she is doing well. She continues to have some soreness along the middle and outside of her knee. She  is not taking her pain meds much at all now.    Pertinent History HTN, DM, no other significant PMH/PSH noted    Currently in Pain? No/denies                         [facility] Adult PT Treatment/Exercise - 12/02/16 0001      Knee/Hip Exercises: Stretches   Gastroc Stretch Right;4 reps;20 seconds   Gastroc Stretch Limitations using sheet    Other Knee/Hip Stretches Rt knee flexion AAROM stretch, seated 10x10 sec hold      Knee/Hip Exercises: Standing   Other Standing Knee Exercises Rt TKE with green TB x15 reps      Knee/Hip Exercises: Seated   Other Seated Knee/Hip Exercises Rt knee extension stretch x5 min, 9# with 1 rest break in between      Knee/Hip Exercises: Prone   Hamstring Curl 2 sets;15 reps   Hamstring Curl Limitations 2lb weight      Modalities   Modalities Cryotherapy     Cryotherapy   Number Minutes Cryotherapy 5 Minutes  untimed    Cryotherapy Location Knee  anterior knee x5 min   Type of Cryotherapy Ice pack     Manual Therapy   Manual therapy comments separate rest of session  Soft tissue mobilization IASTM Rt vastus lateralis; STM anterior knee for improved pain and comfort                PT Education - 12/02/16 1211    Education provided Yes   Education Details importance of progressing to more strengthening and activity based exercises; noted improvements in muscle spasm throughout the RLE.    Person(s) Educated Patient   Methods Explanation   Comprehension Verbalized understanding          PT Short Term Goals - 11/26/16 1337      PT SHORT TERM GOAL #1   Title Patient to demonstrate surgical knee ROM as being at least 0-110 degrees in order to improve mechanics and reduce pain    Baseline 19-109 degrees   Time 3   Period Weeks   Status On-going     PT SHORT TERM GOAL #2   Title Patient to be independent in scar massage, edema controls strategies, and regular icing in order to facilitate self efficacy in managing  condition    Time 3   Period Weeks   Status Achieved     PT SHORT TERM GOAL #3   Title Patient to be able to ambulate without assistive device and with improved gait pattern including full R TKE, equal stance time step/length, improved posture in order to improve community access    Baseline Progression of AMB is vastly improved, but pt lacking TKE ROM.    Time 3   Period Weeks   Status Partially Met     PT SHORT TERM GOAL #4   Title patient to be independent in consistently performing HEP to be updated PRN    Time 1   Period Weeks   Status Achieved           PT Long Term Goals - 11/26/16 1317      PT LONG TERM GOAL #1   Title Patient to demonstrate functional strength as being 5/5 in order to reduce pain and improve functional task performance/tolerance    Baseline ABD/Extension still less than 5/5.    Time 6   Period Weeks   Status Partially Met     PT LONG TERM GOAL #2   Title patient to be able to reciprocally ascend/descend at least 4 stairs with U railing, good eccentric control, and minimal unsteadiness in order to improve home and community access    Time 6   Period Weeks   Status Achieved     PT LONG TERM GOAL #3   Title Patient to be able to complete TUG in 12 seconds or less and ambulate at least 661f during 3MWT with no device in order to show reduced fall risk and enhanced mobility    Baseline TUG: 8.71s on 5/15   Time 6   Status Achieved     PT LONG TERM GOAL #4   Title Patient to report she has been able to return to PLOF based activities with no pain exacerbation in order to improve QOL and return to independence with functional tasks    Period Weeks   Status On-going               Plan - 12/02/16 1212    Clinical Impression Statement Continued focus on exercises to improve active knee ROM. Pt was able to complete exercises with proper technique and verbal cues. Progressed to some strengthening exercises without noted difficulty, pt  reporting muscle fatigue primarily in the hamstrings. Pt would benefit  from further strengthening and functional activity to improve her safety with daily mobility tasks.     Rehab Potential Good   Clinical Impairments Affecting Rehab Potential (+) appears motivated to particiapte in skilled PT services, high PLOF   PT Frequency 3x / week   PT Treatment/Interventions ADLs/Self Care Home Management;Biofeedback;Cryotherapy;DME Instruction;Gait training;Stair training;Functional mobility training;Therapeutic activities;Therapeutic exercise;Balance training;Neuromuscular re-education;Patient/family education;Manual techniques;Scar mobilization;Passive range of motion   PT Next Visit Plan Continue to work on knee flexion stretches; progress quad/hamstring/hip extensor strength; trial functional strengthening (step up, lateral step over)   PT Home Exercise Plan Eval: quad sets, knee extension overpressure, knee flexion stretch    Consulted and Agree with Plan of Care Patient      Patient will benefit from skilled therapeutic intervention in order to improve the following deficits and impairments:  Abnormal gait, Decreased skin integrity, Pain, Decreased coordination, Decreased mobility, Decreased scar mobility, Decreased activity tolerance, Decreased range of motion, Decreased strength, Hypomobility, Decreased balance, Difficulty walking, Increased edema, Impaired flexibility  Visit Diagnosis: Acute pain of right knee  Stiffness of right knee, not elsewhere classified  Muscle weakness (generalized)  Localized edema  Difficulty in walking, not elsewhere classified     Problem List Patient Active Problem List   Diagnosis Date Noted  . OA (osteoarthritis) of knee 10/14/2016    12:24 PM,12/02/16 Elly Modena PT, DPT [facility] [place] [address] [place], [place], [address] Phone: [phone]   Fax:   [phone]  Name: Amy Reyes MRN: [id number] Date of Birth: 03-Feb-1951

## 2016-12-04 ENCOUNTER — Ambulatory Visit (HOSPITAL_COMMUNITY): Payer: Medicare HMO

## 2016-12-04 DIAGNOSIS — R600 Localized edema: Secondary | ICD-10-CM | POA: Diagnosis not present

## 2016-12-04 DIAGNOSIS — M6281 Muscle weakness (generalized): Secondary | ICD-10-CM | POA: Diagnosis not present

## 2016-12-04 DIAGNOSIS — M25661 Stiffness of right knee, not elsewhere classified: Secondary | ICD-10-CM

## 2016-12-04 DIAGNOSIS — M25561 Pain in right knee: Secondary | ICD-10-CM | POA: Diagnosis not present

## 2016-12-04 DIAGNOSIS — R262 Difficulty in walking, not elsewhere classified: Secondary | ICD-10-CM

## 2016-12-04 NOTE — Therapy (Signed)
[place], [place], [address] Phone: [phone]   Fax:  [phone]  Physical Therapy Treatment  Patient Details  Name: Amy Reyes MRN: [id number] Date of Birth: [birth year] Referring Provider: Gaynelle Arabian  Encounter Date: 12/04/2016      PT End of Session - 12/04/16 1351    Visit Number 14   Number of Visits 19   Date for PT Re-Evaluation 12/16/16   Authorization Type [employer] HMO (will need KX and G-codes)   Authorization Time Period 11/04/16 to 12/16/16   Authorization - Visit Number 14   Authorization - Number of Visits 19   PT Start Time 4917   PT Stop Time 9150   PT Time Calculation (min) 40 min   Activity Tolerance Patient tolerated treatment well;No increased pain   Behavior During Therapy WFL for tasks assessed/performed      Past Medical History:  Diagnosis Date  . Anemia    iron defiencey  . Arthritis   . Bronchitis   . Complication of anesthesia    slow to wake up  . Diabetes mellitus without complication ([place])    borderline type 2 on metformin  . History of kidney stones   . Hypercholesteremia   . Hypertension     Past Surgical History:  Procedure Laterality Date  . BREAST SURGERY     reduction  . CHOLECYSTECTOMY    . INJECTION KNEE Left 10/14/2016   Procedure: CORTISONE LEFT KNEE INJECTION;  Surgeon: Gaynelle Arabian, MD;  Location: [facility];  Service: Orthopedics;  Laterality: Left;  . LAPAROSCOPIC GASTRIC BANDING    . TOTAL KNEE ARTHROPLASTY Right 10/14/2016   Procedure: RIGHT TOTAL KNEE ARTHROPLASTY;  Surgeon: Gaynelle Arabian, MD;  Location: [facility];  Service: Orthopedics;  Laterality: Right;  Adductor Block  . tummy tuck      There were no vitals filed for this visit.      Subjective Assessment - 12/04/16 1349    Subjective Pt reports knee is feeling good today, continues to c/o stiffness especially with flexion.  Reports increased compliance with HEP daily.     Pertinent History HTN, DM, no other significant PMH/PSH noted    Patient Stated Goals improve walking, walk with upright posture, bend knee more, get back to PLOF    Currently in Pain? No/denies   Pain Descriptors / Indicators Tightness  Stiffness              [facility] Adult PT Treatment/Exercise - 12/04/16 0001      Knee/Hip Exercises: Stretches   Active Hamstring Stretch Right;3 reps;30 seconds   Active Hamstring Stretch Limitations supine    Quad Stretch Right;3 reps;30 seconds   Quad Stretch Limitations prone with sheet   Knee: Self-Stretch to increase Flexion Right;10 seconds   Knee: Self-Stretch Limitations x10 on 12 inch box    Gastroc Stretch 3 reps;30 seconds   Gastroc Stretch Limitations slant board     Knee/Hip Exercises: Standing   Heel Raises 15 reps   Heel Raises Limitations heel and toe raises on incline   Terminal Knee Extension Limitations TKE GTB 15x5"   Lateral Step Up Right;15 reps;Hand Hold: 2   Lateral Step Up Limitations 4in step   Forward Step Up Right;15 reps;Hand Hold: 1;Step Height: 4"     Knee/Hip Exercises: Supine   Quad Sets 20 reps;Right;AROM   Heel Slides 10 reps   Heel Slides Limitations 4-113   Knee Extension Limitations 4 degrees  Knee Flexion Limitations 113 degrees     Manual Therapy   Manual Therapy Joint mobilization;Soft tissue mobilization   Manual therapy comments separate rest of session    Joint Mobilization patella mobility all directions with R LE out straight for HS stretch    Soft tissue mobilization IASTM Rt vastus lateralis; STM anterior knee for improved pain and comfort   Myofascial Release scar tissue work                   PT Short Term Goals - 11/26/16 1337      PT SHORT TERM GOAL #1   Title Patient to demonstrate surgical knee ROM as being at least 0-110 degrees in order to improve mechanics and reduce pain    Baseline 19-109 degrees   Time 3   Period Weeks   Status On-going     PT SHORT TERM  GOAL #2   Title Patient to be independent in scar massage, edema controls strategies, and regular icing in order to facilitate self efficacy in managing condition    Time 3   Period Weeks   Status Achieved     PT SHORT TERM GOAL #3   Title Patient to be able to ambulate without assistive device and with improved gait pattern including full R TKE, equal stance time step/length, improved posture in order to improve community access    Baseline Progression of AMB is vastly improved, but pt lacking TKE ROM.    Time 3   Period Weeks   Status Partially Met     PT SHORT TERM GOAL #4   Title patient to be independent in consistently performing HEP to be updated PRN    Time 1   Period Weeks   Status Achieved           PT Long Term Goals - 11/26/16 1317      PT LONG TERM GOAL #1   Title Patient to demonstrate functional strength as being 5/5 in order to reduce pain and improve functional task performance/tolerance    Baseline ABD/Extension still less than 5/5.    Time 6   Period Weeks   Status Partially Met     PT LONG TERM GOAL #2   Title patient to be able to reciprocally ascend/descend at least 4 stairs with U railing, good eccentric control, and minimal unsteadiness in order to improve home and community access    Time 6   Period Weeks   Status Achieved     PT LONG TERM GOAL #3   Title Patient to be able to complete TUG in 12 seconds or less and ambulate at least 680f during 3MWT with no device in order to show reduced fall risk and enhanced mobility    Baseline TUG: 8.71s on 5/15   Time 6   Status Achieved     PT LONG TERM GOAL #4   Title Patient to report she has been able to return to PLOF based activities with no pain exacerbation in order to improve QOL and return to independence with functional tasks    Period Weeks   Status On-going               Plan - 12/04/16 1412    Clinical Impression Statement Continued session focus on knee AROM both extension and  flexion.  Continued with active stretches, quad and hamstring strengthening and manual techniques to address quad tightness, scar adhesions and joint restrictions.  Improved AROM following manual and tactile cueing  to increased quadricep contraction with improved AROM 4-113 degrees at EOS.  Added functional strengthening including sit to stand with cueing for eccentric control and step up training for quad strengthening.  Pt able to complete all exercises wtih good form following minimal cueing, no reports of increased pain through session.     Rehab Potential Good   Clinical Impairments Affecting Rehab Potential (+) appears motivated to particiapte in skilled PT services, high PLOF   PT Frequency 3x / week   PT Duration 6 weeks   PT Treatment/Interventions ADLs/Self Care Home Management;Biofeedback;Cryotherapy;DME Instruction;Gait training;Stair training;Functional mobility training;Therapeutic activities;Therapeutic exercise;Balance training;Neuromuscular re-education;Patient/family education;Manual techniques;Scar mobilization;Passive range of motion   PT Next Visit Plan Continue to work on knee extension strengthening, flexion stretches; progress quad/hamstring/hip extensor strength; f/u with new functional strengthening activities complete today (step up, lateral step over)   PT Home Exercise Plan Eval: quad sets, knee extension overpressure, knee flexion stretch; prone knee hang, heel slides; 05/23 STS wiht focus on eccentric control      Patient will benefit from skilled therapeutic intervention in order to improve the following deficits and impairments:  Abnormal gait, Decreased skin integrity, Pain, Decreased coordination, Decreased mobility, Decreased scar mobility, Decreased activity tolerance, Decreased range of motion, Decreased strength, Hypomobility, Decreased balance, Difficulty walking, Increased edema, Impaired flexibility  Visit Diagnosis: Acute pain of right knee  Stiffness of  right knee, not elsewhere classified  Muscle weakness (generalized)  Localized edema  Difficulty in walking, not elsewhere classified     Problem List Patient Active Problem List   Diagnosis Date Noted  . OA (osteoarthritis) of knee 10/14/2016   Ihor Austin, LPTA; CBIS [phone]  Aldona Lento 12/04/2016, 3:36 PM  [place], [place], [address] Phone: [phone]   Fax:  [phone]  Name: Amy Reyes MRN: [id number] Date of Birth: 04-01-1951

## 2016-12-06 ENCOUNTER — Ambulatory Visit (HOSPITAL_COMMUNITY): Payer: Medicare HMO

## 2016-12-06 DIAGNOSIS — R600 Localized edema: Secondary | ICD-10-CM | POA: Diagnosis not present

## 2016-12-06 DIAGNOSIS — R262 Difficulty in walking, not elsewhere classified: Secondary | ICD-10-CM

## 2016-12-06 DIAGNOSIS — M6281 Muscle weakness (generalized): Secondary | ICD-10-CM

## 2016-12-06 DIAGNOSIS — M25561 Pain in right knee: Secondary | ICD-10-CM | POA: Diagnosis not present

## 2016-12-06 DIAGNOSIS — M25661 Stiffness of right knee, not elsewhere classified: Secondary | ICD-10-CM | POA: Diagnosis not present

## 2016-12-06 NOTE — Therapy (Signed)
[place], [place], [address] Phone: [phone]   Fax:  [phone]  Physical Therapy Treatment  Patient Details  Name: Amy Reyes MRN: [id number] Date of Birth: [birth year] Referring Provider: Gaynelle Arabian  Encounter Date: 12/06/2016      PT End of Session - 12/06/16 1127    Visit Number 15   Number of Visits 19   Date for PT Re-Evaluation 12/16/16   Authorization Type [employer] HMO (will need KX and G-codes)   Authorization Time Period 11/04/16 to 12/16/16   Authorization - Visit Number 15   Authorization - Number of Visits 19   PT Start Time 5374  Pt late for apt   PT Stop Time 1216  10 min at EOS on ice   PT Time Calculation (min) 51 min   Activity Tolerance Patient tolerated treatment well;No increased pain   Behavior During Therapy WFL for tasks assessed/performed      Past Medical History:  Diagnosis Date  . Anemia    iron defiencey  . Arthritis   . Bronchitis   . Complication of anesthesia    slow to wake up  . Diabetes mellitus without complication ([place])    borderline type 2 on metformin  . History of kidney stones   . Hypercholesteremia   . Hypertension     Past Surgical History:  Procedure Laterality Date  . BREAST SURGERY     reduction  . CHOLECYSTECTOMY    . INJECTION KNEE Left 10/14/2016   Procedure: CORTISONE LEFT KNEE INJECTION;  Surgeon: Gaynelle Arabian, MD;  Location: [facility];  Service: Orthopedics;  Laterality: Left;  . LAPAROSCOPIC GASTRIC BANDING    . TOTAL KNEE ARTHROPLASTY Right 10/14/2016   Procedure: RIGHT TOTAL KNEE ARTHROPLASTY;  Surgeon: Gaynelle Arabian, MD;  Location: [facility];  Service: Orthopedics;  Laterality: Right;  Adductor Block  . tummy tuck      There were no vitals filed for this visit.      Subjective Assessment - 12/06/16 1125    Subjective Pt reports knee is feeling good today, knee is a little stiff today, no reports of pain.     Pertinent  History HTN, DM, no other significant PMH/PSH noted    Patient Stated Goals improve walking, walk with upright posture, bend knee more, get back to PLOF    Currently in Pain? No/denies   Pain Descriptors / Indicators --  Stiffness                         [facility] Adult PT Treatment/Exercise - 12/06/16 0001      Knee/Hip Exercises: Stretches   Active Hamstring Stretch Right;3 reps;30 seconds   Active Hamstring Stretch Limitations supine    Quad Stretch Right;3 reps;30 seconds   Quad Stretch Limitations prone with sheet   Knee: Self-Stretch to increase Flexion Right;10 seconds   Knee: Self-Stretch Limitations x10 on 12 inch box    Gastroc Stretch 3 reps;30 seconds     Knee/Hip Exercises: Standing   Heel Raises 15 reps   Heel Raises Limitations heel and toe raises on incline   Terminal Knee Extension Limitations TKE GTB 15x5"   Lateral Step Up Right;15 reps;Hand Hold: 2   Lateral Step Up Limitations 4in step   Forward Step Up Right;15 reps;Hand Hold: 1;Step Height: 4"     Knee/Hip Exercises: Seated   Sit to Sand 10 reps;without UE support  cueing for eccentric control  Knee/Hip Exercises: Supine   Heel Slides 5 reps   Heel Slides Limitations 4-112   Knee Extension Limitations 4 degrees   Knee Flexion Limitations 112 degrees     Knee/Hip Exercises: Prone   Contract/Relax to Increase Flexion 3 reps   Straight Leg Raises Limitations TKE 10x 5"     Cryotherapy   Number Minutes Cryotherapy 5 Minutes   Cryotherapy Location Knee   Type of Cryotherapy Ice pack     Manual Therapy   Soft tissue mobilization IASTM Rt vastus lateralis; STM anterior knee for improved pain and comfort                  PT Short Term Goals - 11/26/16 1337      PT SHORT TERM GOAL #1   Title Patient to demonstrate surgical knee ROM as being at least 0-110 degrees in order to improve mechanics and reduce pain    Baseline 19-109 degrees   Time 3   Period Weeks   Status  On-going     PT SHORT TERM GOAL #2   Title Patient to be independent in scar massage, edema controls strategies, and regular icing in order to facilitate self efficacy in managing condition    Time 3   Period Weeks   Status Achieved     PT SHORT TERM GOAL #3   Title Patient to be able to ambulate without assistive device and with improved gait pattern including full R TKE, equal stance time step/length, improved posture in order to improve community access    Baseline Progression of AMB is vastly improved, but pt lacking TKE ROM.    Time 3   Period Weeks   Status Partially Met     PT SHORT TERM GOAL #4   Title patient to be independent in consistently performing HEP to be updated PRN    Time 1   Period Weeks   Status Achieved           PT Long Term Goals - 11/26/16 1317      PT LONG TERM GOAL #1   Title Patient to demonstrate functional strength as being 5/5 in order to reduce pain and improve functional task performance/tolerance    Baseline ABD/Extension still less than 5/5.    Time 6   Period Weeks   Status Partially Met     PT LONG TERM GOAL #2   Title patient to be able to reciprocally ascend/descend at least 4 stairs with U railing, good eccentric control, and minimal unsteadiness in order to improve home and community access    Time 6   Period Weeks   Status Achieved     PT LONG TERM GOAL #3   Title Patient to be able to complete TUG in 12 seconds or less and ambulate at least 642f during 3MWT with no device in order to show reduced fall risk and enhanced mobility    Baseline TUG: 8.71s on 5/15   Time 6   Status Achieved     PT LONG TERM GOAL #4   Title Patient to report she has been able to return to PLOF based activities with no pain exacerbation in order to improve QOL and return to independence with functional tasks    Period Weeks   Status On-going               Plan - 12/06/16 1217    Clinical Impression Statement Pt late for apt today.   Began session with mobilty  exercises and stretches to address the stiffness and improve AROM.  Pt improving quad contraction with minimal cueing required.  AROM at 4-112 degrees.  Progressed functional strengthening with improved weight bearing with sit to stand and increased ease with stair training.  Reviewed compliance iwht HEP and encouraged pt to increase frequency for maximal benefits.  EOS with ice for pain and edema control.  Edema minimal this session.  Manual soft tissue mobilization techniques were complete to reduce tightness, noted increased tightness medial hamstrings.  No reports of pain at EOS.     Rehab Potential Good   Clinical Impairments Affecting Rehab Potential (+) appears motivated to particiapte in skilled PT services, high PLOF   PT Frequency 3x / week   PT Duration 6 weeks   PT Treatment/Interventions ADLs/Self Care Home Management;Biofeedback;Cryotherapy;DME Instruction;Gait training;Stair training;Functional mobility training;Therapeutic activities;Therapeutic exercise;Balance training;Neuromuscular re-education;Patient/family education;Manual techniques;Scar mobilization;Passive range of motion   PT Next Visit Plan Continue functional stretches to improve ROM, quad strengthening and flexion stretches.  Next session increase focus with CKC with step up at [place], lateral step up and begin step down.  Begin functional squats.  Progress to reciprocal stair training when able.     PT Home Exercise Plan Eval: quad sets, knee extension overpressure, knee flexion stretch; prone knee hang, heel slides; 05/23 STS wiht focus on eccentric control      Patient will benefit from skilled therapeutic intervention in order to improve the following deficits and impairments:  Abnormal gait, Decreased skin integrity, Pain, Decreased coordination, Decreased mobility, Decreased scar mobility, Decreased activity tolerance, Decreased range of motion, Decreased strength, Hypomobility, Decreased balance,  Difficulty walking, Increased edema, Impaired flexibility  Visit Diagnosis: Acute pain of right knee  Stiffness of right knee, not elsewhere classified  Muscle weakness (generalized)  Localized edema  Difficulty in walking, not elsewhere classified     Problem List Patient Active Problem List   Diagnosis Date Noted  . OA (osteoarthritis) of knee 10/14/2016   Ihor Austin, LPTA; CBIS [phone]  Aldona Lento 12/06/2016, 12:25 PM  [facility] [address] [place], [place], [address] Phone: [phone]   Fax:  [phone]  Name: Amy Reyes MRN: [id number] Date of Birth: [birth year]

## 2016-12-11 ENCOUNTER — Ambulatory Visit (HOSPITAL_COMMUNITY): Payer: Medicare HMO

## 2016-12-11 DIAGNOSIS — M6281 Muscle weakness (generalized): Secondary | ICD-10-CM | POA: Diagnosis not present

## 2016-12-11 DIAGNOSIS — M25661 Stiffness of right knee, not elsewhere classified: Secondary | ICD-10-CM

## 2016-12-11 DIAGNOSIS — M25561 Pain in right knee: Secondary | ICD-10-CM

## 2016-12-11 DIAGNOSIS — R600 Localized edema: Secondary | ICD-10-CM

## 2016-12-11 DIAGNOSIS — R262 Difficulty in walking, not elsewhere classified: Secondary | ICD-10-CM | POA: Diagnosis not present

## 2016-12-11 NOTE — Therapy (Signed)
[place], [place], [address] Phone: [phone]   Fax:  [phone]  Physical Therapy Treatment  Patient Details  Name: Amy Reyes MRN: [id number] Date of Birth: 03/18/1951 Referring Provider: Gaynelle Arabian  Encounter Date: 12/11/2016      PT End of Session - 12/11/16 1131    Visit Number 16   Number of Visits 19   Date for PT Re-Evaluation 12/16/16   Authorization Type [employer] H[employer] (will need KX and G-codes)   Authorization Time Period 11/04/16 to 12/16/16   Authorization - Visit Number 16   Authorization - Number of Visits 19   PT Start Time 0762  Pt arrived 1115, on phone til 1128   PT Stop Time 1203   PT Time Calculation (min) 35 min   Activity Tolerance Patient tolerated treatment well;No increased pain   Behavior During Therapy WFL for tasks assessed/performed      Past Medical History:  Diagnosis Date  . Anemia    iron defiencey  . Arthritis   . Bronchitis   . Complication of anesthesia    slow to wake up  . Diabetes mellitus without complication ([place])    borderline type 2 on metformin  . History of kidney stones   . Hypercholesteremia   . Hypertension     Past Surgical History:  Procedure Laterality Date  . BREAST SURGERY     reduction  . CHOLECYSTECTOMY    . INJECTION KNEE Left 10/14/2016   Procedure: CORTISONE LEFT KNEE INJECTION;  Surgeon: Gaynelle Arabian, MD;  Location: [facility];  Service: Orthopedics;  Laterality: Left;  . LAPAROSCOPIC GASTRIC BANDING    . TOTAL KNEE ARTHROPLASTY Right 10/14/2016   Procedure: RIGHT TOTAL KNEE ARTHROPLASTY;  Surgeon: Gaynelle Arabian, MD;  Location: [facility];  Service: Orthopedics;  Laterality: Right;  Adductor Block  . tummy tuck      There were no vitals filed for this visit.      Subjective Assessment - 12/11/16 1127    Subjective Pt reports knee is feeling good today, no reports of pain today.     Pertinent History HTN, DM, no other  significant PMH/PSH noted    Patient Stated Goals improve walking, walk with upright posture, bend knee more, get back to PLOF    Currently in Pain? No/denies                         [facility] Adult PT Treatment/Exercise - 12/11/16 0001      Knee/Hip Exercises: Stretches   Active Hamstring Stretch Right;3 reps;30 seconds   Active Hamstring Stretch Limitations standing 12in step     Knee/Hip Exercises: Standing   Heel Raises 15 reps   Heel Raises Limitations heel and toe raises on incline   Terminal Knee Extension Limitations TKE GTB 15x5"   Lateral Step Up Right;15 reps;Hand Hold: 2   Lateral Step Up Limitations 4in step   Forward Step Up Right;15 reps;Hand Hold: 1;Step Height: 4";2 sets;Step Height: 6"   Step Down Right;10 reps;Hand Hold: 1;Step Height: 4"   Functional Squat 10 reps   Functional Squat Limitations cueing for form, front of chair   SLS Rt 31" Lt 30" max of 3   Other Standing Knee Exercises hip hikes 10x 2 sets BLE     Knee/Hip Exercises: Seated   Sit to Sand 10 reps;without UE support  eccentric control     Knee/Hip Exercises: Supine  Knee Extension Limitations 3 degrees   Knee Flexion Limitations 118 degrees                  PT Short Term Goals - 11/26/16 1337      PT SHORT TERM GOAL #1   Title Patient to demonstrate surgical knee ROM as being at least 0-110 degrees in order to improve mechanics and reduce pain    Baseline 19-109 degrees   Time 3   Period Weeks   Status On-going     PT SHORT TERM GOAL #2   Title Patient to be independent in scar massage, edema controls strategies, and regular icing in order to facilitate self efficacy in managing condition    Time 3   Period Weeks   Status Achieved     PT SHORT TERM GOAL #3   Title Patient to be able to ambulate without assistive device and with improved gait pattern including full R TKE, equal stance time step/length, improved posture in order to improve community access     Baseline Progression of AMB is vastly improved, but pt lacking TKE ROM.    Time 3   Period Weeks   Status Partially Met     PT SHORT TERM GOAL #4   Title patient to be independent in consistently performing HEP to be updated PRN    Time 1   Period Weeks   Status Achieved           PT Long Term Goals - 11/26/16 1317      PT LONG TERM GOAL #1   Title Patient to demonstrate functional strength as being 5/5 in order to reduce pain and improve functional task performance/tolerance    Baseline ABD/Extension still less than 5/5.    Time 6   Period Weeks   Status Partially Met     PT LONG TERM GOAL #2   Title patient to be able to reciprocally ascend/descend at least 4 stairs with U railing, good eccentric control, and minimal unsteadiness in order to improve home and community access    Time 6   Period Weeks   Status Achieved     PT LONG TERM GOAL #3   Title Patient to be able to complete TUG in 12 seconds or less and ambulate at least 638f during 3MWT with no device in order to show reduced fall risk and enhanced mobility    Baseline TUG: 8.71s on 5/15   Time 6   Status Achieved     PT LONG TERM GOAL #4   Title Patient to report she has been able to return to PLOF based activities with no pain exacerbation in order to improve QOL and return to independence with functional tasks    Period Weeks   Status On-going               Plan - 12/11/16 1204    Clinical Impression Statement Session focus on progressing to CKC for functional strengthening.  Able to increased step height with forward step ups and added step down for eccentric quad strengthening.  Pt able to demonstrate appropriate form with therex following instruction for form/technique.  No reports of increased pain throguh session.  AROM 3-118 degrees today.  Reviewed HEP wtih additinal HEP given for functional strengthening, able to verbalize and demonstrate appropriate form with all HEP.      Rehab Potential  Good   Clinical Impairments Affecting Rehab Potential (+) appears motivated to particiapte in skilled PT services, high  PLOF   PT Frequency 3x / week   PT Duration 6 weeks   PT Treatment/Interventions ADLs/Self Care Home Management;Biofeedback;Cryotherapy;DME Instruction;Gait training;Stair training;Functional mobility training;Therapeutic activities;Therapeutic exercise;Balance training;Neuromuscular re-education;Patient/family education;Manual techniques;Scar mobilization;Passive range of motion   PT Next Visit Plan Continue function stretches to address ROM.  Continue wiht functional strengthening and stability wtih SLS activities.  Begin reciprocal stair training next session.     PT Home Exercise Plan Eval: quad sets, knee extension overpressure, knee flexion stretch; prone knee hang, heel slides; 05/23 STS wiht focus on eccentric control 5/30: STS, SLS, forward and lateral step up; add squats to HEP when able to complete good form      Patient will benefit from skilled therapeutic intervention in order to improve the following deficits and impairments:  Abnormal gait, Decreased skin integrity, Pain, Decreased coordination, Decreased mobility, Decreased scar mobility, Decreased activity tolerance, Decreased range of motion, Decreased strength, Hypomobility, Decreased balance, Difficulty walking, Increased edema, Impaired flexibility  Visit Diagnosis: Acute pain of right knee  Stiffness of right knee, not elsewhere classified  Muscle weakness (generalized)  Localized edema  Difficulty in walking, not elsewhere classified     Problem List Patient Active Problem List   Diagnosis Date Noted  . OA (osteoarthritis) of knee 10/14/2016   Ihor Austin, LPTA; CBIS [phone]  Aldona Lento 12/11/2016, 12:09 PM  [place], [place], [address] Phone: [phone]   Fax:  [phone]  Name: Amy Reyes MRN: [id number] Date of Birth: 1951-06-04

## 2016-12-11 NOTE — Patient Instructions (Signed)
Quad Sets    Slowly tighten thigh muscles of straight, left leg while counting out loud to 5_. Relax. Repeat 20 times. Do 2 sessions per day.  http://gt2.exer.us/293   Copyright  VHI. All rights reserved.    Functional Quadriceps: Sit to Stand    Sit on edge of chair, feet flat on floor. Stand upright, extending knees fully. Repeat 10 times per set. Do 2 sets per session. http://orth.exer.us/735   Copyright  VHI. All rights reserved.   Single Leg Balance: Eyes Open    Stand on right leg with eyes open. Hold 30-60 seconds. 5 reps 2 times per day.  http://ggbe.exer.us/5   Copyright  VHI. All rights reserved.   Step: Up, Anterior    Stand facing step. Place involved leg up. Raise body using top leg only. Step down backward, involved leg first, lower body using other leg. Repeat 10 times per set. Do 2 sets per session.   Copyright  VHI. All rights reserved.   Step: Up, Lateral    Stand with side toward step. Place involved leg up. Raise body using top leg only. Step off other side, involved leg first, lower body using other leg. Repeat 10 times per set. Do 2 sets per session.   Copyright  VHI. All rights reserved.

## 2016-12-13 ENCOUNTER — Ambulatory Visit (HOSPITAL_COMMUNITY): Payer: Medicare HMO | Attending: Orthopedic Surgery

## 2016-12-13 DIAGNOSIS — R262 Difficulty in walking, not elsewhere classified: Secondary | ICD-10-CM | POA: Diagnosis not present

## 2016-12-13 DIAGNOSIS — M25561 Pain in right knee: Secondary | ICD-10-CM | POA: Insufficient documentation

## 2016-12-13 DIAGNOSIS — M6281 Muscle weakness (generalized): Secondary | ICD-10-CM | POA: Diagnosis not present

## 2016-12-13 DIAGNOSIS — R600 Localized edema: Secondary | ICD-10-CM

## 2016-12-13 DIAGNOSIS — M25661 Stiffness of right knee, not elsewhere classified: Secondary | ICD-10-CM

## 2016-12-13 NOTE — Therapy (Signed)
[place], [place], [address] Phone: [phone]   Fax:  [phone]  Physical Therapy Treatment  Patient Details  Name: Amy Reyes MRN: [id number] Date of Birth: 11-Aug-1950 Referring Provider: Gaynelle Arabian  Encounter Date: 12/13/2016      PT End of Session - 12/13/16 1131    Visit Number 17   Number of Visits 19   Date for PT Re-Evaluation 12/16/16   Authorization Type [employer] HMO (will need KX and G-codes)   Authorization Time Period 11/04/16 to 12/16/16   Authorization - Visit Number 17   Authorization - Number of Visits 19   PT Start Time 2725  pt late for apt   PT Stop Time 1200   PT Time Calculation (min) 32 min   Activity Tolerance Patient tolerated treatment well;No increased pain;Patient limited by fatigue  Pt c/o fatigue due to rough night last night   Behavior During Therapy [facility] for tasks assessed/performed      Past Medical History:  Diagnosis Date  . Anemia    iron defiencey  . Arthritis   . Bronchitis   . Complication of anesthesia    slow to wake up  . Diabetes mellitus without complication ([place])    borderline type 2 on metformin  . History of kidney stones   . Hypercholesteremia   . Hypertension     Past Surgical History:  Procedure Laterality Date  . BREAST SURGERY     reduction  . CHOLECYSTECTOMY    . INJECTION KNEE Left 10/14/2016   Procedure: CORTISONE LEFT KNEE INJECTION;  Surgeon: Gaynelle Arabian, MD;  Location: [facility];  Service: Orthopedics;  Laterality: Left;  . LAPAROSCOPIC GASTRIC BANDING    . TOTAL KNEE ARTHROPLASTY Right 10/14/2016   Procedure: RIGHT TOTAL KNEE ARTHROPLASTY;  Surgeon: Gaynelle Arabian, MD;  Location: [facility];  Service: Orthopedics;  Laterality: Right;  Adductor Block  . tummy tuck      There were no vitals filed for this visit.      Subjective Assessment - 12/13/16 1130    Subjective Pt stated she has been waking up middle of night due to  pain.  No reports of pain today.     Currently in Pain? No/denies             [facility] Adult PT Treatment/Exercise - 12/13/16 0001      Knee/Hip Exercises: Stretches   Sports administrator Right;3 reps;30 seconds   Quad Stretch Limitations prone with sheet     Knee/Hip Exercises: Standing   Heel Raises 15 reps   Heel Raises Limitations heel and toe raises on incline   Terminal Knee Extension Limitations TKE GTB 15x5"   Lateral Step Up 15 reps;Hand Hold: 2;Step Height: 6"   Lateral Step Up Limitations 6in step   Step Down Right;10 reps;Hand Hold: 1;Step Height: 6"   Step Down Limitations cueing for eccentric control   Functional Squat 10 reps   Functional Squat Limitations cueing for form, front of chair   Stairs 5Rt reciprocal pattern 7 in step height   SLS Lt 28", Rt 32"   SLS with Vectors 3x5" holds with intermittent HHA                  PT Short Term Goals - 11/26/16 1337      PT SHORT TERM GOAL #1   Title Patient to demonstrate surgical knee ROM as being at least 0-110 degrees in order to improve mechanics  and reduce pain    Baseline 19-109 degrees   Time 3   Period Weeks   Status On-going     PT SHORT TERM GOAL #2   Title Patient to be independent in scar massage, edema controls strategies, and regular icing in order to facilitate self efficacy in managing condition    Time 3   Period Weeks   Status Achieved     PT SHORT TERM GOAL #3   Title Patient to be able to ambulate without assistive device and with improved gait pattern including full R TKE, equal stance time step/length, improved posture in order to improve community access    Baseline Progression of AMB is vastly improved, but pt lacking TKE ROM.    Time 3   Period Weeks   Status Partially Met     PT SHORT TERM GOAL #4   Title patient to be independent in consistently performing HEP to be updated PRN    Time 1   Period Weeks   Status Achieved           PT Long Term Goals - 11/26/16 1317       PT LONG TERM GOAL #1   Title Patient to demonstrate functional strength as being 5/5 in order to reduce pain and improve functional task performance/tolerance    Baseline ABD/Extension still less than 5/5.    Time 6   Period Weeks   Status Partially Met     PT LONG TERM GOAL #2   Title patient to be able to reciprocally ascend/descend at least 4 stairs with U railing, good eccentric control, and minimal unsteadiness in order to improve home and community access    Time 6   Period Weeks   Status Achieved     PT LONG TERM GOAL #3   Title Patient to be able to complete TUG in 12 seconds or less and ambulate at least 687f during 3MWT with no device in order to show reduced fall risk and enhanced mobility    Baseline TUG: 8.71s on 5/15   Time 6   Status Achieved     PT LONG TERM GOAL #4   Title Patient to report she has been able to return to PLOF based activities with no pain exacerbation in order to improve QOL and return to independence with functional tasks    Period Weeks   Status On-going               Plan - 12/13/16 1152    Clinical Impression Statement Session focus on CKC functional strengthening.  Began reciprocal stair training with 1 hand rail A and cueing to improve eccentric control.  Pt c/o increased pain at night and reports she has not been compliant with HEP/ice application.  Reviewed importance of compliance wiht HEP to reduce knee stiffness and ice application for pain and edema control, pt verbalized understanding.  AROM 3-113 degrees at EOS.     Rehab Potential Good   Clinical Impairments Affecting Rehab Potential (+) appears motivated to particiapte in skilled PT services, high PLOF   PT Frequency 3x / week   PT Duration 6 weeks   PT Treatment/Interventions ADLs/Self Care Home Management;Biofeedback;Cryotherapy;DME Instruction;Gait training;Stair training;Functional mobility training;Therapeutic activities;Therapeutic exercise;Balance  training;Neuromuscular re-education;Patient/family education;Manual techniques;Scar mobilization;Passive range of motion   PT Next Visit Plan Reassess next session.     PT Home Exercise Plan Eval: quad sets, knee extension overpressure, knee flexion stretch; prone knee hang, heel slides; 05/23 STS wiht focus on  eccentric control 5/30: STS, SLS, forward and lateral step up; add squats to HEP when able to complete good form      Patient will benefit from skilled therapeutic intervention in order to improve the following deficits and impairments:  Abnormal gait, Decreased skin integrity, Pain, Decreased coordination, Decreased mobility, Decreased scar mobility, Decreased activity tolerance, Decreased range of motion, Decreased strength, Hypomobility, Decreased balance, Difficulty walking, Increased edema, Impaired flexibility  Visit Diagnosis: Acute pain of right knee  Stiffness of right knee, not elsewhere classified  Muscle weakness (generalized)  Localized edema  Difficulty in walking, not elsewhere classified     Problem List Patient Active Problem List   Diagnosis Date Noted  . OA (osteoarthritis) of knee 10/14/2016   Ihor Austin, LPTA; CBIS [phone]  Aldona Lento 12/13/2016, 12:06 PM  [place], [place], [address] Phone: [phone]   Fax:  [phone]  Name: Amy Reyes MRN: [id number] Date of Birth: 08-Jun-1951

## 2016-12-18 ENCOUNTER — Ambulatory Visit (HOSPITAL_COMMUNITY): Payer: Medicare HMO

## 2016-12-18 DIAGNOSIS — R262 Difficulty in walking, not elsewhere classified: Secondary | ICD-10-CM

## 2016-12-18 DIAGNOSIS — M25561 Pain in right knee: Secondary | ICD-10-CM | POA: Diagnosis not present

## 2016-12-18 DIAGNOSIS — R600 Localized edema: Secondary | ICD-10-CM

## 2016-12-18 DIAGNOSIS — M25661 Stiffness of right knee, not elsewhere classified: Secondary | ICD-10-CM

## 2016-12-18 DIAGNOSIS — M6281 Muscle weakness (generalized): Secondary | ICD-10-CM | POA: Diagnosis not present

## 2016-12-18 NOTE — Therapy (Signed)
[place], [place], [address] Phone: [phone]   Fax:  [phone]  Physical Therapy Treatment (Discharge)  Patient Details  Name: Amy Reyes MRN: [id number] Date of Birth: [birth year] Referring Provider: Gaynelle Arabian  Encounter Date: 12/18/2016      PT End of Session - 12/18/16 1357    Visit Number 18   Number of Visits 19   Date for PT Re-Evaluation 12/16/16   Authorization Type [employer] H[employer] (will need KX and G-codes)   Authorization Time Period 11/04/16 to 12/16/16   Authorization - Visit Number 18   Authorization - Number of Visits 19   PT Start Time 5631   PT Stop Time 1426   PT Time Calculation (min) 32 min   Activity Tolerance Patient tolerated treatment well;No increased pain   Behavior During Therapy WFL for tasks assessed/performed      Past Medical History:  Diagnosis Date  . Anemia    iron defiencey  . Arthritis   . Bronchitis   . Complication of anesthesia    slow to wake up  . Diabetes mellitus without complication ([place])    borderline type 2 on metformin  . History of kidney stones   . Hypercholesteremia   . Hypertension     Past Surgical History:  Procedure Laterality Date  . BREAST SURGERY     reduction  . CHOLECYSTECTOMY    . INJECTION KNEE Left 10/14/2016   Procedure: CORTISONE LEFT KNEE INJECTION;  Surgeon: Gaynelle Arabian, MD;  Location: [facility];  Service: Orthopedics;  Laterality: Left;  . LAPAROSCOPIC GASTRIC BANDING    . TOTAL KNEE ARTHROPLASTY Right 10/14/2016   Procedure: RIGHT TOTAL KNEE ARTHROPLASTY;  Surgeon: Gaynelle Arabian, MD;  Location: [facility];  Service: Orthopedics;  Laterality: Right;  Adductor Block  . tummy tuck      There were no vitals filed for this visit.      Subjective Assessment - 12/18/16 1354    Subjective Pt stated she is feeling good today, mainly stiffness in knee today.   Pertinent History HTN, DM, no other significant PMH/PSH noted     How long can you sit comfortably? Able to sit comfortably for hours without difficulty (Estimated to be about 60-90 minutes; (4/23- this is getting better, maybe 20-30 minutes)   How long can you stand comfortably? Able to stand for a couple hours preparing meal (Estimated to 45 minutes; (4/23- 15 minutes )   How long can you walk comfortably? Reports ability to walk for 45 minutes comfortably (Pt reports she was able [place] AMB 20-30 minutes [place] (4/23- 10 minutes )   Patient Stated Goals improve walking, walk with upright posture, bend knee more, get back to PLOF    Currently in Pain? No/denies  stiffness            [facility] PT Assessment - 12/18/16 0001      Assessment   Medical Diagnosis total knee replacement    Referring Provider Pilar Plate Aluisio   Onset Date/Surgical Date 10/14/16   Next MD Visit Dr. Wynelle Link June   Prior Therapy [facility], now discharged      Precautions   Precautions None     AROM   Right Knee Extension 3  was 13   Right Knee Flexion 115  was 83     Strength   Right Hip Flexion 5/5  was 5/5   Right Hip Extension 5/5  was 4+/5   Right Hip External Rotation  5/5   Right Hip Internal Rotation 5/5   Right Hip ABduction 5/5  was 4/5   Right Hip ADduction 5/5   Left Hip Flexion 5/5   Left Hip Extension 5/5  was 4+/5   Left Hip External Rotation 5/5   Left Hip Internal Rotation 5/5   Left Hip ABduction 5/5  was 4/5   Left Hip ADduction 5/5   Right Knee Flexion 5/5   Right Knee Extension 5/5   Left Knee Flexion 5/5   Left Knee Extension 5/5   Right Ankle Dorsiflexion 5/5   Left Ankle Dorsiflexion 5/5     6 minute walk test results    Aerobic Endurance Distance Walked 903   Endurance additional comments 3MWT no AD     High Level Balance   High Level Balance Comments TUG 8.71s s AD                       [facility] Adult PT Treatment/Exercise - 12/18/16 0001      Knee/Hip Exercises: Stretches   Sports administrator  Right;3 reps;30 seconds   Quad Stretch Limitations prone with sheet     Knee/Hip Exercises: Aerobic   Stationary Bike 5' at beginning of session during subjective info, not included in billing seat 13-->12     Knee/Hip Exercises: Standing   Functional Squat 10 reps   Stairs 5Rt reciprocal pattern 7 in step height 1HR A   Gait Training 3MWT no AD 906 ft                  PT Short Term Goals - 12/18/16 1359      PT SHORT TERM GOAL #1   Title Patient to demonstrate surgical knee ROM as being at least 0-110 degrees in order to improve mechanics and reduce pain    Baseline 12/18/16: 3-115 degrees   Status Partially Met     PT SHORT TERM GOAL #2   Title Patient to be independent in scar massage, edema controls strategies, and regular icing in order to facilitate self efficacy in managing condition    Status Achieved     PT SHORT TERM GOAL #3   Title Patient to be able to ambulate without assistive device and with improved gait pattern including full R TKE, equal stance time step/length, improved posture in order to improve community access    Baseline 12/18/16:  Demonstrated appropriate gait mechanics with increased cadence and no AD, does continue to lack 3 degrees from extension.    Status Partially Met     PT SHORT TERM GOAL #4   Title patient to be independent in consistently performing HEP to be updated PRN    Baseline 12/18/16: reports some compliance, stated she will increase for maximal benefits   Status Achieved           PT Long Term Goals - 12/18/16 1422      PT LONG TERM GOAL #1   Title Patient to demonstrate functional strength as being 5/5 in order to reduce pain and improve functional task performance/tolerance    Status Achieved     PT LONG TERM GOAL #2   Title patient to be able to reciprocally ascend/descend at least 4 stairs with U railing, good eccentric control, and minimal unsteadiness in order to improve home and community access    Status  Achieved     PT LONG TERM GOAL #3   Title  Patient to be able to complete TUG in 12 seconds or less and ambulate at least 651f during 3MWT with no device in order to show reduced fall risk and enhanced mobility    Baseline TUG: 8.71s on 5/15   Status Achieved     PT LONG TERM GOAL #4   Title Patient to report she has been able to return to PLOF based activities with no pain exacerbation in order to improve QOL and return to independence with functional tasks    Status Achieved               Plan - 12/18/16 1527    Clinical Impression Statement Reassessment complete with the following findings: pt has met 2/4 STG and 4/4 LTG.  No reports of pain currently, does c/o some stiffness.  Pt able to demonstrate and verbalize appropriate form and technique with HEP and importance of completing though does admit she has reduced frequency/compliance wiht HEP, stated she plans to increase.  AROM 3-115 degrees today and strength 5/5 for all LE musculature tested.  Increased distance and appropraite gait mechanics noted with 3MWT with no AD.  Reviewed HEP and verbalized importance of compliance.  Pt D/C today per all goals met/partially met.     Rehab Potential Good   Clinical Impairments Affecting Rehab Potential (+) appears motivated to particiapte in skilled PT services, high PLOF   PT Frequency 3x / week   PT Duration 6 weeks   PT Treatment/Interventions ADLs/Self Care Home Management;Biofeedback;Cryotherapy;DME Instruction;Gait training;Stair training;Functional mobility training;Therapeutic activities;Therapeutic exercise;Balance training;Neuromuscular re-education;Patient/family education;Manual techniques;Scar mobilization;Passive range of motion   PT Next Visit Plan D/C to HEP      Patient will benefit from skilled therapeutic intervention in order to improve the following deficits and impairments:  Abnormal gait, Decreased skin integrity, Pain, Decreased coordination, Decreased mobility,  Decreased scar mobility, Decreased activity tolerance, Decreased range of motion, Decreased strength, Hypomobility, Decreased balance, Difficulty walking, Increased edema, Impaired flexibility  Visit Diagnosis: Acute pain of right knee - Plan: PT plan of care cert/re-cert  Stiffness of right knee, not elsewhere classified - Plan: PT plan of care cert/re-cert  Muscle weakness (generalized) - Plan: PT plan of care cert/re-cert  Localized edema - Plan: PT plan of care cert/re-cert  Difficulty in walking, not elsewhere classified - Plan: PT plan of care cert/re-cert    Problem List Patient Active Problem List   Diagnosis Date Noted  . OA (osteoarthritis) of knee 10/14/2016   CIhor Austin LPTA; CBIS [phone]     G-Codes - [id number]    Functional Assessment Tool Used (Outpatient Only) Based on skilled clinical assessment of ROM, gait, strength, edema, fall risk    Functional Limitation Mobility: Walking and moving around   Mobility: Walking and Moving Around Goal Status [phone] At least 1 percent but less than 20 percent impaired, limited or restricted   Mobility: Walking and Moving Around Discharge Status ([phone] At least 1 percent but less than 20 percent impaired, limited or restricted     PHYSICAL THERAPY DISCHARGE SUMMARY  Visits from Start of Care: 18  Current functional level related to goals / functional outcomes: Patient doing fairly well and appears to have maximized benefit from skilled PT services at this time. DC today.    Remaining deficits: [facility]  Education / Equipment: DC today, HEP update  Plan: Patient agrees to discharge.  Patient goals were partially met. Patient is being discharged due to being pleased with  the current functional level.  ?????       Deniece Ree PT, DPT [facility] [address] [place], [place], [address] Phone: [phone]    Fax:  [phone]  Name: Amy Reyes MRN: [id number] Date of Birth: 1950/08/26

## 2016-12-19 DIAGNOSIS — M25561 Pain in right knee: Secondary | ICD-10-CM | POA: Diagnosis not present

## 2016-12-19 DIAGNOSIS — R262 Difficulty in walking, not elsewhere classified: Secondary | ICD-10-CM | POA: Diagnosis not present

## 2016-12-19 DIAGNOSIS — R600 Localized edema: Secondary | ICD-10-CM | POA: Diagnosis not present

## 2016-12-19 DIAGNOSIS — M25661 Stiffness of right knee, not elsewhere classified: Secondary | ICD-10-CM | POA: Diagnosis not present

## 2016-12-19 DIAGNOSIS — M6281 Muscle weakness (generalized): Secondary | ICD-10-CM | POA: Diagnosis not present

## 2017-04-29 DIAGNOSIS — E7800 Pure hypercholesterolemia, unspecified: Secondary | ICD-10-CM | POA: Diagnosis not present

## 2017-04-29 DIAGNOSIS — E1129 Type 2 diabetes mellitus with other diabetic kidney complication: Secondary | ICD-10-CM | POA: Diagnosis not present

## 2017-04-29 DIAGNOSIS — Z0000 Encounter for general adult medical examination without abnormal findings: Secondary | ICD-10-CM | POA: Diagnosis not present

## 2017-04-29 DIAGNOSIS — N183 Chronic kidney disease, stage 3 (moderate): Secondary | ICD-10-CM | POA: Diagnosis not present

## 2017-05-06 ENCOUNTER — Other Ambulatory Visit: Payer: Self-pay | Admitting: Internal Medicine

## 2017-05-06 DIAGNOSIS — Z1231 Encounter for screening mammogram for malignant neoplasm of breast: Secondary | ICD-10-CM

## 2017-05-06 DIAGNOSIS — I129 Hypertensive chronic kidney disease with stage 1 through stage 4 chronic kidney disease, or unspecified chronic kidney disease: Secondary | ICD-10-CM | POA: Diagnosis not present

## 2017-05-06 DIAGNOSIS — N183 Chronic kidney disease, stage 3 (moderate): Secondary | ICD-10-CM | POA: Diagnosis not present

## 2017-05-06 DIAGNOSIS — D631 Anemia in chronic kidney disease: Secondary | ICD-10-CM | POA: Diagnosis not present

## 2017-05-06 DIAGNOSIS — E1129 Type 2 diabetes mellitus with other diabetic kidney complication: Secondary | ICD-10-CM | POA: Diagnosis not present

## 2017-05-06 DIAGNOSIS — R808 Other proteinuria: Secondary | ICD-10-CM | POA: Diagnosis not present

## 2017-05-06 DIAGNOSIS — Z9884 Bariatric surgery status: Secondary | ICD-10-CM | POA: Diagnosis not present

## 2017-05-06 DIAGNOSIS — D692 Other nonthrombocytopenic purpura: Secondary | ICD-10-CM | POA: Diagnosis not present

## 2017-05-06 DIAGNOSIS — Z0000 Encounter for general adult medical examination without abnormal findings: Secondary | ICD-10-CM | POA: Diagnosis not present

## 2017-05-06 DIAGNOSIS — E7800 Pure hypercholesterolemia, unspecified: Secondary | ICD-10-CM | POA: Diagnosis not present

## 2017-05-23 ENCOUNTER — Ambulatory Visit: Payer: Medicare HMO

## 2017-09-30 ENCOUNTER — Encounter: Payer: Self-pay | Admitting: Internal Medicine

## 2017-11-12 DIAGNOSIS — R808 Other proteinuria: Secondary | ICD-10-CM | POA: Diagnosis not present

## 2017-11-12 DIAGNOSIS — I129 Hypertensive chronic kidney disease with stage 1 through stage 4 chronic kidney disease, or unspecified chronic kidney disease: Secondary | ICD-10-CM | POA: Diagnosis not present

## 2017-11-12 DIAGNOSIS — D631 Anemia in chronic kidney disease: Secondary | ICD-10-CM | POA: Diagnosis not present

## 2017-11-12 DIAGNOSIS — E7800 Pure hypercholesterolemia, unspecified: Secondary | ICD-10-CM | POA: Diagnosis not present

## 2017-11-12 DIAGNOSIS — E668 Other obesity: Secondary | ICD-10-CM | POA: Diagnosis not present

## 2017-11-12 DIAGNOSIS — F321 Major depressive disorder, single episode, moderate: Secondary | ICD-10-CM | POA: Diagnosis not present

## 2017-11-12 DIAGNOSIS — N183 Chronic kidney disease, stage 3 (moderate): Secondary | ICD-10-CM | POA: Diagnosis not present

## 2017-11-12 DIAGNOSIS — I10 Essential (primary) hypertension: Secondary | ICD-10-CM | POA: Diagnosis not present

## 2017-11-12 DIAGNOSIS — E1129 Type 2 diabetes mellitus with other diabetic kidney complication: Secondary | ICD-10-CM | POA: Diagnosis not present

## 2018-02-11 ENCOUNTER — Encounter: Payer: Self-pay | Admitting: Gastroenterology

## 2018-03-01 ENCOUNTER — Emergency Department (HOSPITAL_COMMUNITY)
Admission: EM | Admit: 2018-03-01 | Discharge: 2018-03-01 | Disposition: A | Discharge status: Home / Self Care | Payer: Medicare HMO | Attending: Emergency Medicine | Admitting: Emergency Medicine

## 2018-03-01 ENCOUNTER — Encounter (HOSPITAL_COMMUNITY): Payer: Self-pay | Admitting: *Deleted

## 2018-03-01 ENCOUNTER — Other Ambulatory Visit: Payer: Self-pay

## 2018-03-01 ENCOUNTER — Emergency Department (HOSPITAL_COMMUNITY): Payer: Medicare HMO

## 2018-03-01 DIAGNOSIS — I10 Essential (primary) hypertension: Secondary | ICD-10-CM | POA: Diagnosis not present

## 2018-03-01 DIAGNOSIS — Z96651 Presence of right artificial knee joint: Secondary | ICD-10-CM | POA: Diagnosis not present

## 2018-03-01 DIAGNOSIS — Z87891 Personal history of nicotine dependence: Secondary | ICD-10-CM | POA: Insufficient documentation

## 2018-03-01 DIAGNOSIS — R1013 Epigastric pain: Secondary | ICD-10-CM | POA: Diagnosis not present

## 2018-03-01 DIAGNOSIS — Z79899 Other long term (current) drug therapy: Secondary | ICD-10-CM | POA: Insufficient documentation

## 2018-03-01 DIAGNOSIS — E119 Type 2 diabetes mellitus without complications: Secondary | ICD-10-CM | POA: Diagnosis not present

## 2018-03-01 DIAGNOSIS — M25462 Effusion, left knee: Secondary | ICD-10-CM | POA: Diagnosis not present

## 2018-03-01 DIAGNOSIS — M25562 Pain in left knee: Secondary | ICD-10-CM | POA: Insufficient documentation

## 2018-03-01 LAB — CBC
HCT: 31.3 % — ABNORMAL LOW (ref 36.0–46.0)
Hemoglobin: 9.9 g/dL — ABNORMAL LOW (ref 12.0–15.0)
MCH: 26.5 pg (ref 26.0–34.0)
MCHC: 31.6 g/dL (ref 30.0–36.0)
MCV: 83.9 fL (ref 78.0–100.0)
PLATELETS: 258 10*3/uL (ref 150–400)
RBC: 3.73 MIL/uL — AB (ref 3.87–5.11)
RDW: 15.5 % (ref 11.5–15.5)
WBC: 7.7 10*3/uL (ref 4.0–10.5)

## 2018-03-01 LAB — COMPREHENSIVE METABOLIC PANEL
ALBUMIN: 3.9 g/dL (ref 3.5–5.0)
ALK PHOS: 65 U/L (ref 38–126)
ALT: 15 U/L (ref 0–44)
AST: 14 U/L — ABNORMAL LOW (ref 15–41)
Anion gap: 6 (ref 5–15)
BUN: 18 mg/dL (ref 8–23)
CALCIUM: 8.9 mg/dL (ref 8.9–10.3)
CO2: 27 mmol/L (ref 22–32)
CREATININE: 1.02 mg/dL — AB (ref 0.44–1.00)
Chloride: 107 mmol/L (ref 98–111)
GFR, EST NON AFRICAN AMERICAN: 56 mL/min — AB (ref >60)
Glucose, Bld: 93 mg/dL (ref 70–99)
Potassium: 3.5 mmol/L (ref 3.5–5.1)
Sodium: 140 mmol/L (ref 135–145)
TOTAL PROTEIN: 6.8 g/dL (ref 6.5–8.1)
Total Bilirubin: 0.7 mg/dL (ref 0.3–1.2)

## 2018-03-01 LAB — URINALYSIS, ROUTINE W REFLEX MICROSCOPIC
BILIRUBIN URINE: NEGATIVE
Bacteria, UA: NONE SEEN
GLUCOSE, UA: NEGATIVE mg/dL
HGB URINE DIPSTICK: NEGATIVE
KETONES UR: NEGATIVE mg/dL
NITRITE: NEGATIVE
PH: 5 (ref 5.0–8.0)
Protein, ur: NEGATIVE mg/dL
SPECIFIC GRAVITY, URINE: 1.016 (ref 1.005–1.030)

## 2018-03-01 LAB — LIPASE, BLOOD: Lipase: 50 U/L (ref 11–51)

## 2018-03-01 MED ORDER — HYDROCODONE-ACETAMINOPHEN 5-325 MG PO TABS
1.0000 | ORAL_TABLET | Freq: Once | ORAL | Status: AC
  Administered 2018-03-01: 1 via ORAL
  Filled 2018-03-01: qty 1

## 2018-03-01 MED ORDER — ENOXAPARIN SODIUM 100 MG/ML ~~LOC~~ SOLN
1.0000 mg/kg | Freq: Once | SUBCUTANEOUS | Status: AC
  Administered 2018-03-01: 90 mg via SUBCUTANEOUS
  Filled 2018-03-01: qty 1

## 2018-03-01 MED ORDER — OMEPRAZOLE 20 MG PO CPDR: 20.0000 mg | DELAYED_RELEASE_CAPSULE | Freq: Every day | ORAL | 0 refills | Status: DC

## 2018-03-01 NOTE — ED Provider Notes (Signed)
[facility] Provider Note   CSN: [id number] Arrival date & time: 03/01/18  1742     History   Chief Complaint Chief Complaint  Patient presents with  . Abdominal Pain  . Knee Pain    HPI Amy Reyes is a 68 y.o. female.  HPI Patient presents for a few day history of upper abdominal pain.  Worse in the mornings.  No nausea or vomiting.  No blood in the stool.  No fevers.  States she did have an episode 2 weeks ago where she had some blood when she wiped but otherwise no black stool.  Pain is dull.  She has started to take naproxen due to left knee pain.  States she has had an ulcer in the past.  Also has gastric band.  No vomiting.  Patient states she had previously had ulcers that were cured with antibiotics.  Patient has been taking a family members Protonix and states it is helped. Also complaining of pain in her left knee.  Previous knee replacement to the right.  States her right knee has been more painful.  States she needs to have a replaced.  However she states that this is different pain and now goes down the back of the knee if to her ankle.  No swelling.  She is worried because she had a family member that had hip pain and knee pain and then died of a DVT/PE.  Past Medical History:  Diagnosis Date  . Anemia    iron defiencey  . Arthritis   . Bronchitis   . Complication of anesthesia    slow to wake up  . Diabetes mellitus without complication ([place])    borderline type 2 on metformin  . History of kidney stones   . Hypercholesteremia   . Hypertension     Patient Active Problem List   Diagnosis Date Noted  . OA (osteoarthritis) of knee 10/14/2016    Past Surgical History:  Procedure Laterality Date  . BREAST SURGERY     reduction  . CHOLECYSTECTOMY    . INJECTION KNEE Left 10/14/2016   Procedure: CORTISONE LEFT KNEE INJECTION;  Surgeon: Gaynelle Arabian, MD;  Location: [facility];  Service: Orthopedics;  Laterality: Left;  . LAPAROSCOPIC  GASTRIC BANDING    . TOTAL KNEE ARTHROPLASTY Right 10/14/2016   Procedure: RIGHT TOTAL KNEE ARTHROPLASTY;  Surgeon: Gaynelle Arabian, MD;  Location: [facility];  Service: Orthopedics;  Laterality: Right;  Adductor Block  . tummy tuck       OB History   None      Home Medications    Prior to Admission medications   Medication Sig Start Date End Date Taking? Authorizing Provider  atorvastatin (LIPITOR) 40 MG tablet Take 40 mg by mouth daily. 12/27/17  Yes [provider]  cholecalciferol (VITAMIN D) 1000 units tablet Take 1,000 Units by mouth daily.   Yes [provider]  losartan-hydrochlorothiazide (HYZAAR) 100-25 MG tablet Take 1 tablet by mouth daily. 09/08/16  Yes [provider]  metFORMIN (GLUCOPHAGE) 500 MG tablet Take 500 mg by mouth daily. 09/09/16  Yes [provider]  Multiple Vitamin (MULTIVITAMIN WITH MINERALS) TABS tablet Take 1 tablet by mouth daily.   Yes [provider]  omeprazole (PRILOSEC) 20 MG capsule Take 1 capsule (20 mg total) by mouth daily. 03/01/18   Davonna Belling, MD    Family History No family history on file.  Social History Social History   Tobacco Use  . Smoking  status: Former Research scientist (life sciences)  . Smokeless tobacco: Never Used  Substance Use Topics  . Alcohol use: Yes    Comment: wine weekly  . Drug use: No     Allergies   Tramadol and Codeine   Review of Systems Review of Systems  Constitutional: Negative for appetite change and fatigue.  HENT: Negative for congestion.   Respiratory: Negative for shortness of breath.   Cardiovascular: Negative for chest pain and leg swelling.  Gastrointestinal: Positive for abdominal pain. Negative for anal bleeding, blood in stool, nausea and vomiting.  Genitourinary: Negative for flank pain.  Musculoskeletal: Negative for back pain.       Left knee pain  Skin: Negative for rash and wound.  Neurological: Negative for tremors.  Musculoskeletal: Some pain with movement of  right knee.  Not irritable.  Tenderness behind the knee without tenderness over the calf.  No edema.  No skin changes.   Physical Exam Updated Vital Signs BP 114/66   Pulse 67   Temp 97.9 F (36.6 C) (Oral)   Resp 16   Ht 5' 6.5" (1.689 m)   Wt 91.6 kg   SpO2 100%   BMI 32.12 kg/m   Physical Exam  Constitutional: She appears well-developed.  HENT:  Head: Atraumatic.  Eyes: EOM are normal.  Cardiovascular: Normal rate.  Abdominal: Normal appearance.  Mild left upper quadrant tenderness without rebound or guarding.  Neurological: She is alert.  Skin: Skin is warm. Capillary refill takes less than 2 seconds.     ED Treatments / Results  Labs (all labs ordered are listed, but only abnormal results are displayed) Labs Reviewed  COMPREHENSIVE METABOLIC PANEL - Abnormal; Notable for the following components:      Result Value   Creatinine, Ser 1.02 (*)    AST 14 (*)    GFR calc non Af Amer 56 (*)    All other components within normal limits  CBC - Abnormal; Notable for the following components:   RBC 3.73 (*)    Hemoglobin 9.9 (*)    HCT 31.3 (*)    All other components within normal limits  URINALYSIS, ROUTINE W REFLEX MICROSCOPIC - Abnormal; Notable for the following components:   APPearance HAZY (*)    Leukocytes, UA TRACE (*)    All other components within normal limits  LIPASE, BLOOD    EKG None  Radiology Dg Knee Complete 4 Views Left  Result Date: 03/01/2018 CLINICAL DATA:  Left knee pain EXAM: LEFT KNEE - COMPLETE 4+ VIEW COMPARISON:  None. FINDINGS: No fracture or malalignment. Mild patellofemoral degenerative change and mild lateral compartment degenerative change. Moderate degenerative change of the medial compartment. Small knee effusion. IMPRESSION: Mild-to-moderate degenerative change with knee effusion. No acute osseous abnormality. Electronically Signed   By: Donavan Foil M.D.   On: 03/01/2018 20:58    Procedures Procedures (including critical  care time)  Medications Ordered in ED Medications  enoxaparin (LOVENOX) injection 90 mg (90 mg Subcutaneous Given 03/01/18 2227)  HYDROcodone-acetaminophen (NORCO/VICODIN) 5-325 MG per tablet 1 tablet (1 tablet Oral Given 03/01/18 2226)     Initial Impression / Assessment and Plan / ED Course  I have reviewed the triage vital signs and the nursing notes.  Pertinent labs & imaging results that were available during my care of the patient were reviewed by me and considered in my medical decision making (see chart for details).     Patient with abdominal pain.  Potentially gastritis.  History of same or ulcer.  Lab work overall reassuring.  Will start on PPI.  Also left knee pain.  X-ray shows arthritis.  However with the change of the pain will get outpatient Doppler.  Discharge home.  Final Clinical Impressions(s) / ED Diagnoses   Final diagnoses:  Acute pain of left knee  Epigastric pain    ED Discharge Orders         Ordered    US Venous Img Lower Unilateral Left     03/01/18 2156    omeprazole (PRILOSEC) 20 MG capsule  Daily     03/01/18 2159           Davonna Belling, MD 03/01/18 2348

## 2018-03-01 NOTE — ED Notes (Signed)
Pt report she needs a knee replacement- has been laying carpet and moving more  Now with more pain than usual  Reports pain as 9/10  Also with abd pain that feels like "it's ulcerated" to her LLQ reports took her daughter's prilosec with relief and now it does not hurt as badly as 3/'10

## 2018-03-01 NOTE — ED Notes (Signed)
Pt aware urine sample needed,just can't go right now,will check on pt in 30 minutes.

## 2018-03-01 NOTE — Discharge Instructions (Addendum)
Follow-up for the ultrasound.  Follow-up with Dr. Osborne Casco for thorough evaluation of your epigastric pain.

## 2018-03-01 NOTE — ED Triage Notes (Addendum)
Pt c/o nausea and burning sensation in middle of abdomen x 5 days. Pt reports the pain has gotten better when she has taken some Protonix at home. Pt also c/o worsening left knee pain that radiates down in the left ankle x 4-5 days. Denies injury. Pt ambulatory in triage. Denies vomiting.

## 2018-03-01 NOTE — ED Notes (Signed)
From Rad 

## 2018-03-27 DIAGNOSIS — H524 Presbyopia: Secondary | ICD-10-CM | POA: Diagnosis not present

## 2018-04-09 ENCOUNTER — Ambulatory Visit: Payer: Medicare HMO | Admitting: Gastroenterology

## 2018-04-20 ENCOUNTER — Encounter (HOSPITAL_COMMUNITY): Payer: Self-pay | Admitting: Physical Therapy

## 2018-05-12 DIAGNOSIS — R82998 Other abnormal findings in urine: Secondary | ICD-10-CM | POA: Diagnosis not present

## 2018-05-12 DIAGNOSIS — E7800 Pure hypercholesterolemia, unspecified: Secondary | ICD-10-CM | POA: Diagnosis not present

## 2018-05-12 DIAGNOSIS — N183 Chronic kidney disease, stage 3 (moderate): Secondary | ICD-10-CM | POA: Diagnosis not present

## 2018-05-12 DIAGNOSIS — E1129 Type 2 diabetes mellitus with other diabetic kidney complication: Secondary | ICD-10-CM | POA: Diagnosis not present

## 2018-05-13 DIAGNOSIS — D631 Anemia in chronic kidney disease: Secondary | ICD-10-CM | POA: Diagnosis not present

## 2018-05-13 DIAGNOSIS — Z0000 Encounter for general adult medical examination without abnormal findings: Secondary | ICD-10-CM | POA: Diagnosis not present

## 2018-05-13 DIAGNOSIS — E7800 Pure hypercholesterolemia, unspecified: Secondary | ICD-10-CM | POA: Diagnosis not present

## 2018-05-13 DIAGNOSIS — N183 Chronic kidney disease, stage 3 (moderate): Secondary | ICD-10-CM | POA: Diagnosis not present

## 2018-05-13 DIAGNOSIS — D692 Other nonthrombocytopenic purpura: Secondary | ICD-10-CM | POA: Diagnosis not present

## 2018-05-13 DIAGNOSIS — F321 Major depressive disorder, single episode, moderate: Secondary | ICD-10-CM | POA: Diagnosis not present

## 2018-05-13 DIAGNOSIS — R808 Other proteinuria: Secondary | ICD-10-CM | POA: Diagnosis not present

## 2018-05-13 DIAGNOSIS — I129 Hypertensive chronic kidney disease with stage 1 through stage 4 chronic kidney disease, or unspecified chronic kidney disease: Secondary | ICD-10-CM | POA: Diagnosis not present

## 2018-05-13 DIAGNOSIS — E1129 Type 2 diabetes mellitus with other diabetic kidney complication: Secondary | ICD-10-CM | POA: Diagnosis not present

## 2018-05-15 ENCOUNTER — Encounter: Payer: Self-pay | Admitting: Gastroenterology

## 2018-05-15 ENCOUNTER — Other Ambulatory Visit (INDEPENDENT_AMBULATORY_CARE_PROVIDER_SITE_OTHER): Payer: 59

## 2018-05-15 ENCOUNTER — Ambulatory Visit (INDEPENDENT_AMBULATORY_CARE_PROVIDER_SITE_OTHER): Payer: 59 | Admitting: Gastroenterology

## 2018-05-15 ENCOUNTER — Encounter (INDEPENDENT_AMBULATORY_CARE_PROVIDER_SITE_OTHER): Payer: Self-pay

## 2018-05-15 VITALS — BP 116/70 | HR 64 | Ht 65.0 in | Wt 208.4 lb

## 2018-05-15 DIAGNOSIS — K279 Peptic ulcer, site unspecified, unspecified as acute or chronic, without hemorrhage or perforation: Secondary | ICD-10-CM | POA: Diagnosis not present

## 2018-05-15 DIAGNOSIS — Z860100 Personal history of colon polyps, unspecified: Secondary | ICD-10-CM

## 2018-05-15 DIAGNOSIS — Z9884 Bariatric surgery status: Secondary | ICD-10-CM

## 2018-05-15 DIAGNOSIS — K625 Hemorrhage of anus and rectum: Secondary | ICD-10-CM

## 2018-05-15 DIAGNOSIS — D649 Anemia, unspecified: Secondary | ICD-10-CM

## 2018-05-15 LAB — IRON: IRON: 43 ug/dL (ref 42–145)

## 2018-05-15 LAB — FOLATE: FOLATE: 18.2 ng/mL (ref >5.9)

## 2018-05-15 LAB — VITAMIN B12: Vitamin B-12: 194 pg/mL — ABNORMAL LOW (ref 211–911)

## 2018-05-15 LAB — FERRITIN: FERRITIN: 11 ng/mL (ref 10.0–291.0)

## 2018-05-15 MED ORDER — NA SULFATE-K SULFATE-MG SULF 17.5-3.13-1.6 GM/177ML PO SOLN: 1.0000 | ORAL | 0 refills | Status: AC

## 2018-05-15 NOTE — Progress Notes (Signed)
Referring Provider: Haywood Pao, MD Primary Care Physician:  Haywood Pao, MD   Reason for Consultation: Discuss colonoscopy   IMPRESSION:  BRBPR previously attributed to hemorrhoids Normocytic anemia with hgb 9.9 03/23/2018 NSAIDs for right knee pain History of PUD, likely due to H pylori History of colon polyps, last colonoscopy >10 years (patient report) Gastric lap banding 8 years ago in [place], [place]  Colonoscopy recommended to evaluate the source of bright red blood per rectum. She is already overdue for surveillance due to colon polyps.   PLAN: Anemia labs: iron, ferritin, b12, folate, reticulocyte count, haptoglobin Obtain addition hemoglobin results from Dr. Osborne Casco - is her anemia stabe? Avoid NSAIDs as able Colonoscopy   I consented the patient at the bedside today discussing the risks, benefits, and alternatives to endoscopic evaluation. In particular, we discussed the risks that include, but are not limited to, reaction to medication, cardiopulmonary compromise, bleeding requiring blood transfusion, aspiration resulting in pneumonia, perforation requiring surgery, and even death. We reviewed the risk of missed lesion including polyps or even cancer. The patient acknowledges these risks and asks that we proceed.   HPI: Amy Reyes is a 67 y.o. female referred by Dr. Osborne Casco.  The history is obtained through the patient and review of her electronic health record. Under recent stress around her almost 90 year old great nephew and custody issues. Gastric band placed while living in [place], [place] 8 years ago.   Episode in August where she saw blood on the toilet paper. She attributes this to hemorrhoids. Occurs rarely over the last few years occurring once or twice yearly. More blood in August than she is used to in the past. No rectal pain, tearing, hard stools, constipation, tenesmus, urgency, abdominal pain. No other change in bowel habits. No prior  history of bleeding. Has a history of colon polyps on a colonoscopy >10 years ago. No other associated symptoms. No identified exacerbating or relieving features.   She is been using NSAIDs ibuprofen and more recently Celebrex for right knee pain after knee replacement.  There is a distant history of ulcer disease requiring antibiotics for what sounds like H. pylori.  Prior gastric banding.  Abdominal pain that she attributes to ulcer symptoms flaired earlier this year during the time of stress around her deceased niece.   Labs from 03-23-18 show a hemoglobin of 9.9, MCV 83.9, RDW 15.5. She is aware of her anemia. She is a vegetarian. She attributes her anemia to her diet. She has been prescribed iron supplements in the past.   Prior colonoscopy in [place], [place] >10 years ago. She had a polyp removed and was told to have another exam in five years. She cannot remember the name of the gastroenterologist.    Past Medical History:  Diagnosis Date  . Anemia    iron defiencey  . Arthritis   . Bronchitis   . Complication of anesthesia    slow to wake up  . Diabetes mellitus without complication ([place])    borderline type 2 on metformin  . History of kidney stones   . Hypercholesteremia   . Hypertension     Past Surgical History:  Procedure Laterality Date  . BREAST SURGERY     reduction  . CHOLECYSTECTOMY    . INJECTION KNEE Left 10/14/2016   Procedure: CORTISONE LEFT KNEE INJECTION;  Surgeon: Gaynelle Arabian, MD;  Location: [facility];  Service: Orthopedics;  Laterality: Left;  . LAPAROSCOPIC GASTRIC BANDING    . TOTAL  KNEE ARTHROPLASTY Right 10/14/2016   Procedure: RIGHT TOTAL KNEE ARTHROPLASTY;  Surgeon: Gaynelle Arabian, MD;  Location: [facility];  Service: Orthopedics;  Laterality: Right;  Adductor Block  . tummy tuck      Prior to Admission medications   Medication Sig Start Date End Date Taking? Authorizing Provider  atorvastatin (LIPITOR) 40 MG tablet Take 40 mg by mouth daily. 12/27/17    [provider]  cholecalciferol (VITAMIN D) 1000 units tablet Take 1,000 Units by mouth daily.    [provider]  losartan-hydrochlorothiazide (HYZAAR) 100-25 MG tablet Take 1 tablet by mouth daily. 09/08/16   [provider]  metFORMIN (GLUCOPHAGE) 500 MG tablet Take 500 mg by mouth daily. 09/09/16   [provider]  Multiple Vitamin (MULTIVITAMIN WITH MINERALS) TABS tablet Take 1 tablet by mouth daily.    [provider]  omeprazole (PRILOSEC) 20 MG capsule Take 1 capsule (20 mg total) by mouth daily. 03/01/18   Davonna Belling, MD    Current Outpatient Medications  Medication Sig Dispense Refill  . atorvastatin (LIPITOR) 40 MG tablet Take 40 mg by mouth daily.  3  . cholecalciferol (VITAMIN D) 1000 units tablet Take 1,000 Units by mouth daily.    Marland Kitchen losartan-hydrochlorothiazide (HYZAAR) 100-25 MG tablet Take 1 tablet by mouth daily.    . metFORMIN (GLUCOPHAGE) 500 MG tablet Take 500 mg by mouth daily.    . Multiple Vitamin (MULTIVITAMIN WITH MINERALS) TABS tablet Take 1 tablet by mouth daily.    Marland Kitchen omeprazole (PRILOSEC) 20 MG capsule Take 1 capsule (20 mg total) by mouth daily. 14 capsule 0   No current facility-administered medications for this visit.     Allergies as of 05/15/2018 - Review Complete 03/01/2018  Allergen Reaction Noted  . Tramadol Itching 10/14/2016  . Codeine  03/01/2018    No family history on file.  Social History   Socioeconomic History  . Marital status: Legally Separated    Spouse name: Not on file  . Number of children: Not on file  . Years of education: Not on file  . Highest education level: Not on file  Occupational History  . Not on file  Social Needs  . Financial resource strain: Not on file  . Food insecurity:    Worry: Not on file    Inability: Not on file  . Transportation needs:    Medical: Not on file    Non-medical: Not on file  Tobacco Use  . Smoking status: Former Research scientist (life sciences)  . Smokeless  tobacco: Never Used  Substance and Sexual Activity  . Alcohol use: Yes    Comment: wine weekly  . Drug use: No  . Sexual activity: Yes  Lifestyle  . Physical activity:    Days per week: Not on file    Minutes per session: Not on file  . Stress: Not on file  Relationships  . Social connections:    Talks on phone: Not on file    Gets together: Not on file    Attends religious service: Not on file    Active member of club or organization: Not on file    Attends meetings of clubs or organizations: Not on file    Relationship status: Not on file  . Intimate partner violence:    Fear of current or ex partner: Not on file    Emotionally abused: Not on file    Physically abused: Not on file    Forced sexual activity: Not on file  Other Topics  Concern  . Not on file  Social History Narrative  . Not on file    Review of Systems: 12 system ROS is negative except as noted above.   Physical Exam: Vital signs were reviewed. General:   Alert, well-nourished, pleasant and cooperative in NAD Head:  Normocephalic and atraumatic. Eyes:  Sclera clear, no icterus.   Conjunctiva pink. Mouth:  No deformity or lesions.   Neck:  Supple; no thyromegaly. Lungs:  Clear throughout to auscultation.   No wheezes.  Heart:  Regular rate and rhythm; no murmurs Abdomen:  Soft, mild central obesity, nontender, normal bowel sounds. No rebound or guarding. No hepatosplenomegaly Rectal:  Deferred  Msk:  Symmetrical without gross deformities. Extremities:  No gross deformities or edema. Neurologic:  Alert and  oriented x4;  grossly nonfocal Skin:  No rash or bruise. Psych:  Alert and cooperative. Normal mood and affect.   Estiben Mizuno L. Tarri Glenn Md, MPH [facility] 05/15/2018, 8:27 AM

## 2018-05-15 NOTE — Patient Instructions (Addendum)
Tips for colonoscopy:  -STAY WELL HYDRATED FOR 3-4 DAYS PRIOR TO THE EXAM. This reduces nausea and dehydration.  -TO PREVENT SKIN/HEMORRHOID IRRITATION- prior to wiping, put A&Dointment or vaseline on the toilet paper. -Keep a towel or pad on the bed.  -DRINK 64oz of clear liquids in the morning of prep day (PRIOR TO STARTING THE PREP) to be sure that there is enough fluid to flush the colon and stay hydrated!!!! This is in addition to the fluids required for preparation.  You have been scheduled for a colonoscopy. Please follow written instructions given to you at your visit today.  Please pick up your prep supplies at the pharmacy within the next 1-3 days. If you use inhalers (even only as needed), please bring them with you on the day of your procedure. Your physician has requested that you go to www.startemmi.com and enter the access code given to you at your visit today. This web site gives a general overview about your procedure. However, you should still follow specific instructions given to you by our office regarding your preparation for the procedure.  

## 2018-05-16 LAB — HAPTOGLOBIN: Haptoglobin: 233 mg/dL — ABNORMAL HIGH (ref 43–212)

## 2018-05-16 LAB — RETICULOCYTES
ABS RETIC: 36540 {cells}/uL (ref 20000–8000)
RETIC CT PCT: 0.9 %

## 2018-05-18 ENCOUNTER — Other Ambulatory Visit: Payer: Self-pay

## 2018-05-18 DIAGNOSIS — E538 Deficiency of other specified B group vitamins: Secondary | ICD-10-CM

## 2018-06-05 ENCOUNTER — Encounter: Payer: Self-pay | Admitting: Gastroenterology

## 2018-06-08 ENCOUNTER — Telehealth: Payer: Self-pay | Admitting: Gastroenterology

## 2018-06-08 NOTE — Telephone Encounter (Signed)
Thank you for the update!

## 2018-06-08 NOTE — Telephone Encounter (Signed)
Called patient to confirm procedure appt for tomorrow. Patient states her granddaughter had something come up and can not bring her tomorrow. Patient rescheduled to 12.6.19. FYI of late cancellation.

## 2018-06-09 ENCOUNTER — Encounter: Payer: 59 | Admitting: Gastroenterology

## 2018-06-19 ENCOUNTER — Encounter: Payer: Medicare HMO | Admitting: Gastroenterology

## 2018-07-13 ENCOUNTER — Ambulatory Visit (AMBULATORY_SURGERY_CENTER): Payer: Medicare HMO | Admitting: Gastroenterology

## 2018-07-13 ENCOUNTER — Encounter: Payer: Self-pay | Admitting: Gastroenterology

## 2018-07-13 VITALS — BP 116/66 | HR 57 | Temp 97.5°F | Resp 20 | Ht 65.0 in | Wt 208.0 lb

## 2018-07-13 DIAGNOSIS — K625 Hemorrhage of anus and rectum: Secondary | ICD-10-CM

## 2018-07-13 DIAGNOSIS — D509 Iron deficiency anemia, unspecified: Secondary | ICD-10-CM

## 2018-07-13 DIAGNOSIS — D122 Benign neoplasm of ascending colon: Secondary | ICD-10-CM

## 2018-07-13 DIAGNOSIS — D125 Benign neoplasm of sigmoid colon: Secondary | ICD-10-CM | POA: Diagnosis not present

## 2018-07-13 DIAGNOSIS — D649 Anemia, unspecified: Secondary | ICD-10-CM | POA: Diagnosis not present

## 2018-07-13 DIAGNOSIS — D120 Benign neoplasm of cecum: Secondary | ICD-10-CM | POA: Diagnosis not present

## 2018-07-13 MED ORDER — HYDROCORTISONE ACETATE 25 MG RE SUPP: 25.0000 mg | Freq: Two times a day (BID) | RECTAL | 12 refills | Status: DC

## 2018-07-13 MED ORDER — SODIUM CHLORIDE 0.9 % IV SOLN: 500.0000 mL | Freq: Once | INTRAVENOUS | Status: DC

## 2018-07-13 NOTE — Op Note (Addendum)
[facility] Patient Name: Amy Reyes Procedure Date: 07/13/2018 10:01 AM MRN: [id number] Endoscopist: Thornton Park MD, MD Age: 67 Referring MD:  Date of Birth: 23-Jun-1951 Gender: Female Account #: [id number] Procedure:                Colonoscopy Indications:              Rectal bleeding. No known family history of colon                            cancer or polyps. Medicines:                See the Anesthesia note for documentation of the                            administered medications Procedure:                Pre-Anesthesia Assessment:                           - Prior to the procedure, a History and Physical                            was performed, and patient medications and                            allergies were reviewed. The patient's tolerance of                            previous anesthesia was also reviewed. The risks                            and benefits of the procedure and the sedation                            options and risks were discussed with the patient.                            All questions were answered, and informed consent                            was obtained. Prior Anticoagulants: The patient has                            taken no previous anticoagulant or antiplatelet                            agents. ASA Grade Assessment: II - A patient with                            mild systemic disease. After reviewing the risks                            and benefits, the patient was deemed in  satisfactory condition to undergo the procedure.                           After obtaining informed consent, the colonoscope                            was passed under direct vision. Throughout the                            procedure, the patient's blood pressure, pulse, and                            oxygen saturations were monitored continuously. The                            Colonoscope was introduced through  the anus and                            advanced to the the terminal ileum, with                            identification of the appendiceal orifice and IC                            valve. The colonoscopy was performed without                            difficulty. The patient tolerated the procedure                            well. The quality of the bowel preparation was                            good. The terminal ileum, ileocecal valve,                            appendiceal orifice, and rectum were photographed. Scope In: 10:23:07 AM Scope Out: 10:41:27 AM Scope Withdrawal Time: 0 hours 13 minutes 15 seconds  Total Procedure Duration: 0 hours 18 minutes 20 seconds  Findings:                 Hemorrhoids were found on perianal exam.                           A 18 mm polyp was found in the cecum. The polyp was                            sessile. The polyp is deeply wedged behind the IC                            valve. The polyp was removed with a piecemeal                            technique using a hot  snare. Resection and                            retrieval were complete. Estimated blood loss was                            minimal.                           A 2 mm polyp was found in the ascending colon. The                            polyp was sessile. The polyp was removed with a                            cold biopsy forceps. Resection and retrieval were                            complete. Estimated blood loss was minimal.                           A 2 mm polyp was found in the ascending colon. The                            polyp was sessile. The polyp was removed with a                            cold biopsy forceps. Resection and retrieval were                            complete.                           A 5 mm polyp was found in the sigmoid colon. The                            polyp was pedunculated. The polyp was removed with                            a cold snare.  Resection and retrieval were                            complete. Estimated blood loss: none.                           The exam was otherwise without abnormality on                            direct and retroflexion views. Complications:            No immediate complications. Estimated blood loss:                            Minimal. Estimated Blood Loss:     Estimated blood loss was  minimal. Impression:               - Hemorrhoids found on perianal exam.                           - One 18 mm polyp in the cecum, removed piecemeal                            using a hot snare. Resected and retrieved.                           - One 2 mm polyp in the ascending colon, removed                            with a cold biopsy forceps. Resected and retrieved.                           - One 2 mm polyp in the ascending colon, removed                            with a cold biopsy forceps. Resected and retrieved.                           - One 5 mm polyp in the sigmoid colon, removed with                            a cold snare. Resected and retrieved.                           - The examination was otherwise normal on direct                            and retroflexion views. Recommendation:           - Discharge patient to home.                           - Resume regular diet today.                           - Continue present medications.                           - Await pathology results.                           - Local hemorrhoidal therapy recommended.                           - Repeat colonoscopy in 3 months to review the                            cecal polypectomy site. Thornton Park MD, MD 07/13/2018 10:48:56 AM This report has been signed electronically.

## 2018-07-13 NOTE — Patient Instructions (Addendum)
YOU HAD AN ENDOSCOPIC PROCEDURE TODAY AT [facility]:   Refer to the procedure report that was given to you for any specific questions about what was found during the examination.  If the procedure report does not answer your questions, please call your gastroenterologist to clarify.  If you requested that your care partner not be given the details of your procedure findings, then the procedure report has been included in a sealed envelope for you to review at your convenience later.  YOU SHOULD EXPECT: Some feelings of bloating in the abdomen. Passage of more gas than usual.  Walking can help get rid of the air that was put into your GI tract during the procedure and reduce the bloating. If you had a lower endoscopy (such as a colonoscopy or flexible sigmoidoscopy) you may notice spotting of blood in your stool or on the toilet paper. If you underwent a bowel prep for your procedure, you may not have a normal bowel movement for a few days.  Please Note:  You might notice some irritation and congestion in your nose or some drainage.  This is from the oxygen used during your procedure.  There is no need for concern and it should clear up in a day or so.  SYMPTOMS TO REPORT IMMEDIATELY:   Following lower endoscopy (colonoscopy or flexible sigmoidoscopy):  Excessive amounts of blood in the stool  Significant tenderness or worsening of abdominal pains  Swelling of the abdomen that is new, acute  Fever of 100F or higher    For urgent or emergent issues, a gastroenterologist can be reached at any hour by calling [phone].   DIET:  We do recommend a small meal at first, but then you may proceed to your regular diet.  Drink plenty of fluids but you should avoid alcoholic beverages for 24 hours.  ACTIVITY:  You should plan to take it easy for the rest of today and you should NOT DRIVE or use heavy machinery until tomorrow (because of the sedation medicines used during the test).     FOLLOW UP: Our staff will call the number listed on your records the next business day following your procedure to check on you and address any questions or concerns that you may have regarding the information given to you following your procedure. If we do not reach you, we will leave a message.  However, if you are feeling well and you are not experiencing any problems, there is no need to return our call.  We will assume that you have returned to your regular daily activities without incident.  If any biopsies were taken you will be contacted by phone or by letter within the next 1-3 weeks.  Please call us at [phone] if you have not heard about the biopsies in 3 weeks.    SIGNATURES/CONFIDENTIALITY: You and/or your care partner have signed paperwork which will be entered into your electronic medical record.  These signatures attest to the fact that that the information above on your After Visit Summary has been reviewed and is understood.  Full responsibility of the confidentiality of this discharge information lies with you and/or your care-partner.    Handouts were given to your care partner on polyps and hemorrhoids. Rx was sent to [facility] for Anusol HC supp.  Use 2 x per day for 7 day.  Refill are added if needed.  Your blood sugar was 111 in the recovery room.  You may resume your current medications  today. Await biopsy results. Please call if any questions or concerns.

## 2018-07-13 NOTE — Progress Notes (Signed)
Called to room to assist during endoscopic procedure.  Patient ID and intended procedure confirmed with present staff. Received instructions for my participation in the procedure from the performing physician.  

## 2018-07-13 NOTE — Progress Notes (Signed)
A/ox3, pleased with MAC, report to RN 

## 2018-07-13 NOTE — Progress Notes (Addendum)
Vocal order from Dr. Thornton Park for Anusol [facility] Supp #14 BID for 7 days.  Refill X12.  Rx was sent to [place], [place], [place].  I asked Dr. Tarri Glenn if pt needs to hold asa ans asa products and she she no restrictions.  No problems noted in the recovery room. Maw  Per Dr. Tarri Glenn, pt is to have a follow up colonoscopy in 3 months to check to make sure the polypectomy site in the cecum was totally removed.  Sarah Monday, RN checked the schedule to set up an appointment.  The procedure schedule is not out that far as of now.  Pt was advised we will send her a letter that it is time to set up appointment or the office will call her with an appointment once the schedule is opened.  Pt understood. maw

## 2018-07-16 ENCOUNTER — Other Ambulatory Visit: Payer: Self-pay | Admitting: Emergency Medicine

## 2018-07-16 ENCOUNTER — Telehealth: Payer: Self-pay

## 2018-07-16 MED ORDER — HYDROCORTISONE 2.5 % RE CREA: 1.0000 "application " | TOPICAL_CREAM | Freq: Two times a day (BID) | RECTAL | 1 refills | Status: DC

## 2018-07-16 NOTE — Progress Notes (Unsigned)
Pts insurance would not cover hydrocortisone suppositories so I substituted hydrocortisone cream on a glycerin suppository to insert rectally twice a day for 7 days.

## 2018-07-16 NOTE — Progress Notes (Signed)
I agree with that plan. Thank you.

## 2018-07-16 NOTE — Telephone Encounter (Signed)
  Follow up Call-  Call back number 07/13/2018  Post procedure Call Back phone  # [phone]  Permission to leave phone message Yes  Some recent data might be hidden     Patient questions:  Do you have a fever, pain , or abdominal swelling? No. Pain Score  0 *  Have you tolerated food without any problems? Yes.    Have you been able to return to your normal activities? Yes.    Do you have any questions about your discharge instructions: Diet   No. Medications  No. Follow up visit  No.  Do you have questions or concerns about your Care? No.  Actions: * If pain score is 4 or above: No action needed, pain <4.

## 2018-09-02 ENCOUNTER — Encounter: Payer: Self-pay | Admitting: Gastroenterology

## 2018-10-01 ENCOUNTER — Telehealth: Payer: Self-pay | Admitting: *Deleted

## 2018-10-01 NOTE — Telephone Encounter (Signed)
Covid-19 travel screening questions  Have you traveled in the last 14 days? No If yes where?   Do you now or have you had a fever in the last 14 days? No  Do you have any respiratory symptoms of shortness of breath or cough now or in the last 14 days? No  Do you have a medical history of Congestive Heart Failure? No  Do you have a medical history of lung disease? No  Do you have any family members or close contacts with diagnosed or suspected Covid-19? No        

## 2018-10-02 ENCOUNTER — Telehealth: Payer: Self-pay | Admitting: *Deleted

## 2018-10-02 NOTE — Telephone Encounter (Signed)
Dr. Tarri Glenn,  This patient is coming in for her pv today.  Her colonoscopy is 10-16-18.  The reason she is coming in is for a three month check of a polypectomy site of a n 18 mm polyp deeply wedged behind IC valve.  I am just checking with you since it's not a typical surveillance procedure- would you like to proceed as scheduled or wait longer?  Thanks, [employer]

## 2018-10-02 NOTE — Telephone Encounter (Signed)
Given current recommendations to cancel all non-emergent procedure due to [place], I recommend that this endoscopy be rescheduled. She should not wait longer than 3 additional months to have the procedure performed. Thank you.

## 2018-10-16 ENCOUNTER — Encounter: Payer: Medicare HMO | Admitting: Gastroenterology

## 2018-11-06 ENCOUNTER — Encounter: Payer: Self-pay | Admitting: Gastroenterology

## 2018-11-12 ENCOUNTER — Encounter: Payer: Medicare HMO | Admitting: Gastroenterology

## 2018-11-13 DIAGNOSIS — E669 Obesity, unspecified: Secondary | ICD-10-CM | POA: Diagnosis not present

## 2018-11-13 DIAGNOSIS — Z9884 Bariatric surgery status: Secondary | ICD-10-CM | POA: Diagnosis not present

## 2018-11-13 DIAGNOSIS — E1129 Type 2 diabetes mellitus with other diabetic kidney complication: Secondary | ICD-10-CM | POA: Diagnosis not present

## 2018-11-13 DIAGNOSIS — D631 Anemia in chronic kidney disease: Secondary | ICD-10-CM | POA: Diagnosis not present

## 2018-11-13 DIAGNOSIS — N183 Chronic kidney disease, stage 3 (moderate): Secondary | ICD-10-CM | POA: Diagnosis not present

## 2018-11-13 DIAGNOSIS — D126 Benign neoplasm of colon, unspecified: Secondary | ICD-10-CM | POA: Diagnosis not present

## 2018-11-13 DIAGNOSIS — I129 Hypertensive chronic kidney disease with stage 1 through stage 4 chronic kidney disease, or unspecified chronic kidney disease: Secondary | ICD-10-CM | POA: Diagnosis not present

## 2018-11-13 DIAGNOSIS — E7800 Pure hypercholesterolemia, unspecified: Secondary | ICD-10-CM | POA: Diagnosis not present

## 2018-11-13 DIAGNOSIS — R809 Proteinuria, unspecified: Secondary | ICD-10-CM | POA: Diagnosis not present

## 2018-11-17 ENCOUNTER — Telehealth: Payer: Self-pay | Admitting: *Deleted

## 2018-11-17 NOTE — Telephone Encounter (Signed)
Pt needs a Monday or Wednesday appointment d/t child care.  Dr. Tarri Glenn doesn't have those days available in May.  Recall had been put in- [id number].  Pt will call back to schedule both PV and colonoscopy at the beginning of June.

## 2018-11-30 ENCOUNTER — Telehealth: Payer: Self-pay | Admitting: Gastroenterology

## 2018-11-30 DIAGNOSIS — E1129 Type 2 diabetes mellitus with other diabetic kidney complication: Secondary | ICD-10-CM | POA: Diagnosis not present

## 2018-11-30 DIAGNOSIS — E7800 Pure hypercholesterolemia, unspecified: Secondary | ICD-10-CM | POA: Diagnosis not present

## 2018-11-30 DIAGNOSIS — N183 Chronic kidney disease, stage 3 (moderate): Secondary | ICD-10-CM | POA: Diagnosis not present

## 2018-11-30 NOTE — Telephone Encounter (Signed)
Pt called to inform that she just had blood work done at her PCP's office. Pt states that Dr. Tarri Glenn also ordered some blood work for her, pt wants is asking if we can compare if those labs are the same as the ones that Dr. Tarri Glenn ordered. She had Blood work at [facility], the phone # is [phone]. Please ask for Judson Roch.

## 2018-11-30 NOTE — Telephone Encounter (Signed)
This RN called [phone] and spoke to Judson Roch and requested the [facility] fax the patients lab results so Dr. Tarri Glenn could review.

## 2018-12-09 NOTE — Telephone Encounter (Signed)
Patient is wanting to know if our office has received her lab results?

## 2018-12-10 NOTE — Telephone Encounter (Signed)
Called [facility] to re-fax lab results.

## 2018-12-10 NOTE — Telephone Encounter (Signed)
Spoke to the patient to inform the patient that we have not yet received the faxed lab results from [facility]. The patient said she would call them to try and figure out why [facility] has not received the fax.

## 2018-12-11 NOTE — Telephone Encounter (Signed)
Pt said you need to call [place] @ [facility].  Estill Bamberg said she sent the lab work results

## 2018-12-11 NOTE — Telephone Encounter (Signed)
Amy Reyes at [facility] called to verified if we received copy of labs that she faxed on 5/27 to fax # [phone]. She stated that pt call their office very upset because those labs are preventing her from scheduling her procedure.

## 2018-12-11 NOTE — Telephone Encounter (Signed)
[facility] again because the patients lab results have not been received. Spoke to Judson Roch who made sure faxed results were sent. Lab results received and given to [facility] CMA who placed them in Dr. Tarri Glenn box for evaluation. Patient notified [facility] has received her lab work.

## 2018-12-11 NOTE — Telephone Encounter (Signed)
This RN called again to give a different fax number. [facility] reported they would fax the lab results to the number provided [phone].

## 2018-12-16 ENCOUNTER — Other Ambulatory Visit: Payer: Self-pay | Admitting: Emergency Medicine

## 2018-12-16 ENCOUNTER — Telehealth: Payer: Self-pay

## 2018-12-16 DIAGNOSIS — Z86010 Personal history of colonic polyps: Secondary | ICD-10-CM

## 2018-12-16 DIAGNOSIS — D509 Iron deficiency anemia, unspecified: Secondary | ICD-10-CM

## 2018-12-16 MED ORDER — NA SULFATE-K SULFATE-MG SULF 17.5-3.13-1.6 GM/177ML PO SOLN: 1.0000 | ORAL | 0 refills | Status: DC

## 2018-12-16 NOTE — Telephone Encounter (Signed)
Called and LM for pt to call back to schedule repeat colonoscopy with Dr. Tarri Glenn to review the cecal polypectomy site from procedure in 06-2018.

## 2018-12-21 ENCOUNTER — Telehealth: Payer: Self-pay | Admitting: *Deleted

## 2018-12-21 NOTE — Telephone Encounter (Signed)
Covid-19 screening questions  Have you traveled in the last 14 days? If yes where? no  Do you now or have you had a fever in the last 14 days? no  Do you have any respiratory symptoms of shortness of breath or cough now or in the last 14 days? no  Do you have any family members or close contacts with diagnosed or suspected Covid-19 in the past 14 days? no  Have you been tested for Covid-19 and found to be positive? no

## 2018-12-22 ENCOUNTER — Other Ambulatory Visit: Payer: Self-pay

## 2018-12-22 ENCOUNTER — Encounter: Payer: Self-pay | Admitting: Gastroenterology

## 2018-12-22 ENCOUNTER — Ambulatory Visit (AMBULATORY_SURGERY_CENTER): Payer: Medicare HMO | Admitting: Gastroenterology

## 2018-12-22 ENCOUNTER — Encounter: Payer: Medicare HMO | Admitting: Gastroenterology

## 2018-12-22 VITALS — BP 130/62 | HR 94 | Temp 98.2°F | Resp 24 | Ht 65.0 in | Wt 208.0 lb

## 2018-12-22 DIAGNOSIS — I10 Essential (primary) hypertension: Secondary | ICD-10-CM | POA: Diagnosis not present

## 2018-12-22 DIAGNOSIS — K2970 Gastritis, unspecified, without bleeding: Secondary | ICD-10-CM

## 2018-12-22 DIAGNOSIS — K635 Polyp of colon: Secondary | ICD-10-CM | POA: Diagnosis not present

## 2018-12-22 DIAGNOSIS — K3189 Other diseases of stomach and duodenum: Secondary | ICD-10-CM

## 2018-12-22 DIAGNOSIS — K2980 Duodenitis without bleeding: Secondary | ICD-10-CM

## 2018-12-22 DIAGNOSIS — D123 Benign neoplasm of transverse colon: Secondary | ICD-10-CM | POA: Diagnosis not present

## 2018-12-22 DIAGNOSIS — K209 Esophagitis, unspecified without bleeding: Secondary | ICD-10-CM

## 2018-12-22 DIAGNOSIS — D120 Benign neoplasm of cecum: Secondary | ICD-10-CM

## 2018-12-22 DIAGNOSIS — E119 Type 2 diabetes mellitus without complications: Secondary | ICD-10-CM | POA: Diagnosis not present

## 2018-12-22 DIAGNOSIS — D508 Other iron deficiency anemias: Secondary | ICD-10-CM

## 2018-12-22 DIAGNOSIS — Z860100 Personal history of colon polyps, unspecified: Secondary | ICD-10-CM

## 2018-12-22 DIAGNOSIS — K279 Peptic ulcer, site unspecified, unspecified as acute or chronic, without hemorrhage or perforation: Secondary | ICD-10-CM | POA: Diagnosis not present

## 2018-12-22 DIAGNOSIS — K210 Gastro-esophageal reflux disease with esophagitis: Secondary | ICD-10-CM

## 2018-12-22 DIAGNOSIS — K2210 Ulcer of esophagus without bleeding: Secondary | ICD-10-CM | POA: Diagnosis not present

## 2018-12-22 MED ORDER — OMEPRAZOLE 40 MG PO CPDR: 40.0000 mg | DELAYED_RELEASE_CAPSULE | Freq: Two times a day (BID) | ORAL | 0 refills | Status: DC

## 2018-12-22 MED ORDER — SODIUM CHLORIDE 0.9 % IV SOLN: 500.0000 mL | Freq: Once | INTRAVENOUS | Status: DC

## 2018-12-22 NOTE — Progress Notes (Signed)
Called to room to assist during endoscopic procedure.  Patient ID and intended procedure confirmed with present staff. Received instructions for my participation in the procedure from the performing physician.  

## 2018-12-22 NOTE — Patient Instructions (Addendum)
Please read handouts provided. Continue present medications. Omeprazole 40 mg twice daily for 8 weeks. Await pathology results. Avoid all NSAIDS      YOU HAD AN ENDOSCOPIC PROCEDURE TODAY AT [facility]:   Refer to the procedure report that was given to you for any specific questions about what was found during the examination.  If the procedure report does not answer your questions, please call your gastroenterologist to clarify.  If you requested that your care partner not be given the details of your procedure findings, then the procedure report has been included in a sealed envelope for you to review at your convenience later.  YOU SHOULD EXPECT: Some feelings of bloating in the abdomen. Passage of more gas than usual.  Walking can help get rid of the air that was put into your GI tract during the procedure and reduce the bloating. If you had a lower endoscopy (such as a colonoscopy or flexible sigmoidoscopy) you may notice spotting of blood in your stool or on the toilet paper. If you underwent a bowel prep for your procedure, you may not have a normal bowel movement for a few days.  Please Note:  You might notice some irritation and congestion in your nose or some drainage.  This is from the oxygen used during your procedure.  There is no need for concern and it should clear up in a day or so.  SYMPTOMS TO REPORT IMMEDIATELY:   Following lower endoscopy (colonoscopy or flexible sigmoidoscopy):  Excessive amounts of blood in the stool  Significant tenderness or worsening of abdominal pains  Swelling of the abdomen that is new, acute  Fever of 100F or higher   Following upper endoscopy (EGD)  Vomiting of blood or coffee ground material  New chest pain or pain under the shoulder blades  Painful or persistently difficult swallowing  New shortness of breath  Fever of 100F or higher  Black, tarry-looking stools  For urgent or emergent issues, a gastroenterologist  can be reached at any hour by calling [phone].   DIET:  We do recommend a small meal at first, but then you may proceed to your regular diet.  Drink plenty of fluids but you should avoid alcoholic beverages for 24 hours.  ACTIVITY:  You should plan to take it easy for the rest of today and you should NOT DRIVE or use heavy machinery until tomorrow (because of the sedation medicines used during the test).    FOLLOW UP: Our staff will call the number listed on your records 48-72 hours following your procedure to check on you and address any questions or concerns that you may have regarding the information given to you following your procedure. If we do not reach you, we will leave a message.  We will attempt to reach you two times.  During this call, we will ask if you have developed any symptoms of COVID 19. If you develop any symptoms (ie: fever, flu-like symptoms, shortness of breath, cough etc.) before then, please call [phone].  If you test positive for Covid 19 in the 2 weeks post procedure, please call and report this information to [place].    If any biopsies were taken you will be contacted by phone or by letter within the next 1-3 weeks.  Please call us at [phone] if you have not heard about the biopsies in 3 weeks.    SIGNATURES/CONFIDENTIALITY: You and/or your care partner have signed paperwork which will be entered into  your electronic medical record.  These signatures attest to the fact that that the information above on your After Visit Summary has been reviewed and is understood.  Full responsibility of the confidentiality of this discharge information lies with you and/or your care-partner.

## 2018-12-22 NOTE — Progress Notes (Signed)
Pt's states no medical or surgical changes since previsit or office visit. 

## 2018-12-22 NOTE — Progress Notes (Signed)
1306 Pt experienced laryngeal spasm with O2 sat down to 70s, jaw thrust performed with O2 sat increasing to over 90, vss

## 2018-12-22 NOTE — Progress Notes (Signed)
Report given to PACU, vss 

## 2018-12-22 NOTE — Op Note (Signed)
[place] Patient Name: Amy Reyes Procedure Date: 12/22/2018 12:57 PM MRN: [id number] Endoscopist: Thornton Park MD, MD Age: 68 Referring MD:  Date of Birth: 18-Feb-1951 Gender: Female Account #: [id number] Procedure:                Upper GI endoscopy Indications:              Iron deficiency anemia                           NSAIDs for right knee pain                           History of PUD, likely due to H pylori                           Gastric lap banding 8 years ago in [place], [place] Medicines:                See the Anesthesia note for documentation of the                            administered medications Procedure:                Pre-Anesthesia Assessment:                           - Prior to the procedure, a History and Physical                            was performed, and patient medications and                            allergies were reviewed. The patient's tolerance of                            previous anesthesia was also reviewed. The risks                            and benefits of the procedure and the sedation                            options and risks were discussed with the patient.                            All questions were answered, and informed consent                            was obtained. Prior Anticoagulants: The patient has                            taken no previous anticoagulant or antiplatelet                            agents. ASA Grade Assessment: II - A patient with  mild systemic disease. After reviewing the risks                            and benefits, the patient was deemed in                            satisfactory condition to undergo the procedure.                           After obtaining informed consent, the endoscope was                            passed under direct vision. Throughout the                            procedure, the patient's blood pressure, pulse, and              oxygen saturations were monitored continuously. The                            Endoscope was introduced through the mouth, and                            advanced to the third part of duodenum. The upper                            GI endoscopy was accomplished without difficulty.                            The patient tolerated the procedure well. Scope In: Scope Out: Findings:                 LA Grade C (one or more mucosal breaks less than 5                            mm, not extending between tops of 2 mucosal folds)                            esophagitis with no bleeding was found. Biopsies                            were taken with a cold forceps for histology.                            Estimated blood loss was minimal.                           Diffuse minimal inflammation was found in the                            gastric body and antrum. Biopsies were taken with a                            cold forceps for histology. Estimated blood  loss                            was minimal.                           The examined duodenum was normal. Biopsies were                            taken with a cold forceps for histology. Estimated                            blood loss was minimal.                           The cardia and gastric fundus were normal on                            retroflexion.                           The exam was otherwise without abnormality. Complications:            No immediate complications. Estimated blood loss:                            Minimal. Estimated Blood Loss:     Estimated blood loss was minimal. Impression:               - LA Grade C reflux esophagitis. Biopsied.                           - Gastritis. Biopsied.                           - Normal examined duodenum. Biopsied.                           - The examination was otherwise normal. Recommendation:           - Patient has a contact number available for                             emergencies. The signs and symptoms of potential                            delayed complications were discussed with the                            patient. Return to normal activities tomorrow.                            Written discharge instructions were provided to the                            patient.                           -  Resume regular diet.                           - Continue present medications.                           - Await pathology results.                           - Increase omeprazole to 40 mg BID x 8 weeks.                           - Avoid all NSAIDs                           - Surveillance upper endoscopy is not recommended                            at this time. Thornton Park MD, MD 12/22/2018 1:45:21 PM This report has been signed electronically.

## 2018-12-22 NOTE — Op Note (Signed)
[place] Patient Name: Amy Reyes Procedure Date: 12/22/2018 12:56 PM MRN: [id number] Endoscopist: Thornton Park MD, MD Age: 68 Referring MD:  Date of Birth: [birth year] Gender: Female Account #: [id number] Procedure:                Colonoscopy Indications:              Surveillance: Personal history of colonic polyps                            (unknown histology) on last colonoscopy more than 5                            years ago                           BRBPR previously attributed to hemorrhoids                           Normocytic anemia with hgb 9.9 03/01/18                           NSAIDs for right knee pain                           History of PUD, likely due to H pylori                           History of colon polyps, last colonoscopy >10 years                            (patient report)                           Gastric lap banding 8 years ago in [place],                            [place] Medicines:                See the Anesthesia note for documentation of the                            administered medications Procedure:                Pre-Anesthesia Assessment:                           - Prior to the procedure, a History and Physical                            was performed, and patient medications and                            allergies were reviewed. The patient's tolerance of                            previous anesthesia was also reviewed. The risks  and benefits of the procedure and the sedation                            options and risks were discussed with the patient.                            All questions were answered, and informed consent                            was obtained. Prior Anticoagulants: The patient has                            taken no previous anticoagulant or antiplatelet                            agents. ASA Grade Assessment: III - A patient with                            severe  systemic disease. After reviewing the risks                            and benefits, the patient was deemed in                            satisfactory condition to undergo the procedure.                           After obtaining informed consent, the colonoscope                            was passed under direct vision. Throughout the                            procedure, the patient's blood pressure, pulse, and                            oxygen saturations were monitored continuously. The                            Model CF-HQ190L [id number]) scope was introduced                            through the anus and advanced to the the terminal                            ileum, with identification of the appendiceal                            orifice and IC valve. The colonoscopy was performed                            without difficulty. The patient tolerated the  procedure well. The quality of the bowel                            preparation was good. The terminal ileum, ileocecal                            valve, appendiceal orifice, and rectum were                            photographed. Scope In: 1:15:47 PM Scope Out: 1:34:51 PM Scope Withdrawal Time: 0 hours 14 minutes 51 seconds  Total Procedure Duration: 0 hours 19 minutes 4 seconds  Findings:                 Hemorrhoids were found on perianal exam.                           Multiple small-mouthed diverticula were found in                            the sigmoid colon and descending colon.                           A 1 mm polyp was found in the cecum. The polyp was                            sessile. The polyp was removed with a cold snare.                            Resection and retrieval were complete. Estimated                            blood loss was minimal.                           A 3 mm polyp was found in the hepatic flexure. The                            polyp was sessile. The polyp was removed  with a                            cold snare. Resection and retrieval were complete.                           Three sessile polyps were found in the hepatic                            flexure. The polyps were 1 to 3 mm in size. These                            polyps were removed with a cold biopsy forceps.                            Resection and  retrieval were complete. Estimated                            blood loss was minimal.                           A 3 mm polyp was found in the splenic flexure. The                            polyp was sessile. The polyp was removed with a                            cold snare. Resection and retrieval were complete.                           Non-bleeding internal hemorrhoids were found during                            retroflexion. The hemorrhoids were small.                           The exam was otherwise without abnormality on                            direct and retroflexion views. Complications:            No immediate complications. Estimated blood loss:                            Minimal. Estimated Blood Loss:     Estimated blood loss was minimal. Impression:               - Hemorrhoids found on perianal exam.                           - Diverticulosis in the sigmoid colon and in the                            descending colon.                           - One 1 mm polyp in the cecum, removed with a cold                            snare. Resected and retrieved.                           - One 3 mm polyp at the hepatic flexure, removed                            with a cold snare. Resected and retrieved.                           - Three 1 to 3 mm polyps at the hepatic flexure,  removed with a cold biopsy forceps. Resected and                            retrieved.                           - One 3 mm polyp at the splenic flexure, removed                            with a cold snare. Resected and retrieved.                            - Non-bleeding internal hemorrhoids.                           - The examination was otherwise normal on direct                            and retroflexion views. Recommendation:           - Patient has a contact number available for                            emergencies. The signs and symptoms of potential                            delayed complications were discussed with the                            patient. Return to normal activities tomorrow.                            Written discharge instructions were provided to the                            patient.                           - Resume regular diet today.                           - Continue present medications.                           - Await pathology results.                           - Repeat colonoscopy date to be determined after                            pending pathology results are reviewed for                            surveillance. Recommend a colonoscopy in 3 years if  at least 3 polyps are adenomas. Thornton Park MD, MD 12/22/2018 1:54:25 PM This report has been signed electronically.

## 2018-12-24 ENCOUNTER — Telehealth: Payer: Self-pay | Admitting: *Deleted

## 2018-12-24 NOTE — Telephone Encounter (Signed)
  Follow up Call-  Call back number 12/22/2018 07/13/2018  Post procedure Call Back phone  # [phone]  Permission to leave phone message Yes Yes  Some recent data might be hidden     Patient questions:  Do you have a fever, pain , or abdominal swelling? No. Pain Score  0 *  Have you tolerated food without any problems? Yes.    Have you been able to return to your normal activities? Yes.    Do you have any questions about your discharge instructions: Diet   No. Medications  No. Follow up visit  No.  Do you have questions or concerns about your Care? No.  Actions: * If pain score is 4 or above: No action needed, pain <4.  1. Have you developed a fever since your procedure? NO  2.   Have you had an respiratory symptoms (SOB or cough) since your procedure? NO  3.   Have you tested positive for COVID 19 since your procedure NO  4.   Have you had any family members/close contacts diagnosed with the COVID 19 since your procedure?  NO   If yes to any of these questions please route to Joylene John, RN and Alphonsa Gin, RN.

## 2019-01-01 ENCOUNTER — Telehealth: Payer: Self-pay | Admitting: *Deleted

## 2019-01-01 NOTE — Telephone Encounter (Signed)
Notified patient of results.   The patient has several questions that needed to be directed to Dr. Tarri Glenn.   TELEPHONE (requested per patient) visit scheduled on 7/10 at 2:00 pm.

## 2019-01-14 ENCOUNTER — Other Ambulatory Visit: Payer: Self-pay | Admitting: Gastroenterology

## 2019-01-22 ENCOUNTER — Other Ambulatory Visit: Payer: Self-pay

## 2019-01-22 ENCOUNTER — Ambulatory Visit (INDEPENDENT_AMBULATORY_CARE_PROVIDER_SITE_OTHER): Payer: Medicare HMO | Admitting: Gastroenterology

## 2019-01-22 ENCOUNTER — Encounter: Payer: Medicare HMO | Admitting: Gastroenterology

## 2019-01-22 ENCOUNTER — Encounter: Payer: Self-pay | Admitting: Gastroenterology

## 2019-01-22 DIAGNOSIS — D508 Other iron deficiency anemias: Secondary | ICD-10-CM

## 2019-01-22 DIAGNOSIS — E538 Deficiency of other specified B group vitamins: Secondary | ICD-10-CM

## 2019-01-22 DIAGNOSIS — K209 Esophagitis, unspecified without bleeding: Secondary | ICD-10-CM

## 2019-01-22 DIAGNOSIS — K625 Hemorrhage of anus and rectum: Secondary | ICD-10-CM | POA: Diagnosis not present

## 2019-01-22 DIAGNOSIS — M25562 Pain in left knee: Secondary | ICD-10-CM | POA: Diagnosis not present

## 2019-01-22 DIAGNOSIS — M1712 Unilateral primary osteoarthritis, left knee: Secondary | ICD-10-CM | POA: Diagnosis not present

## 2019-01-22 NOTE — Patient Instructions (Addendum)
Please stop by for labs at your convenience so that we can follow-up on your iron and B12 levels.  Continue to take omeprazole 40 mg twice daily for another 4 weeks.  Then reduce the dose to 40 mg daily.  The potential long-term risks of omeprazole use that we discussed today I would like to have you on the lowest dose possible to control your symptoms.  Continue to avoid all nonsteroidal anti-inflammatory medications.  Keeping your bones healthy is important. I recommend that you take daily calcium 1200 mg and Vitamin D 800 IU supplements daily. Weight bearing exercise is also good for your bones.   I recommend a colonoscopy in June 2021 given the large number of polyps that you have had removed over the last year.  I hope that you and baby Amy Reyes continue to have a healthy and happy summer.  Please call with any questions or concerns.

## 2019-01-22 NOTE — Progress Notes (Signed)
TELEHEALTH VISIT  Referring Provider: Haywood Pao, MD Primary Care Physician:  Haywood Pao, MD   Tele-visit due to COVID-19 pandemic Patient requested visit virtually, consented to the virtual encounter via video enabled telemedicine application (telephone at patient request) Contact made at: 14:00 01/22/19 Patient verified by name and date of birth Location of patient: Home Location provider: [facility] Names of persons participating: Me, patient, Tinnie Gens CMA Time spent on telehealth visit: 28 minutes I discussed the limitations of evaluation and management by telemedicine. The patient expressed understanding and agreed to proceed.  Chief complaint: questions about recent endoscopy  IMPRESSION:  History of colon polyps    - 07/13/18: 18 mm cecal tubular adenoma was found deeply wedged behind the IC valve        - and small tubular adenomas (32mm ascending, 18mm ascending, 20mm sigmoid)        - surveillance recommended in 6 months given the piecemeal technique    - 12/22/18: 1 mm residual polyp in the cecum       -  And small tubular adenomas (4 1-3 mm hepatic flexure,3 mm splenic flexure)        - surveillance colonoscopy recommended in one year Ulcerated esophagitis with biopsies negative for viral or fungal infections 12/22/18    - symptoms resolved on PPI BID x 8 weeks BRBPR attributed to hemorrhoids Normocytic anemia with hgb 9.9 03/01/18    - labs 03/01/18: 9.9 8/18/9    - labs 05/15/18: iron 43, ferritin 11    - labs 11/30/18: hgb 10.5, MCV 86, RDW 14.5, platelets 220 B12 deficiency attributed to vegetarian diet    - labs 05/15/18: B12 194    - normal gastric biopsies 12/22/18 NSAIDs for right knee pain Distant history of PUD, likely due to H pylori Gastric lap banding 8 years ago in [place], [place]  Reviewed her recent endoscopy and path results. Continued abstinence from NSAIDs is recommended. I have recommended a surveillance colonoscopy in one  year given the number of polyps that have been removed this year as well as to recheck the cecal polypectomy site where a piecemeal polypectomy was performed.   Labs today to follow-up on her anemia. May need to consider capsule endoscopy if iron deficiency persists, although I think her ulcerated esophagitis was the likely source.    This patient is on long-term PPI therapy.  We discussed: the need for calcium with vit. D supplements because of possible interference with absorption; potential increased risk of bone fractures, GI infections, cardiac related disease, interactions with clopidogrel (Plavix), kidney disease; c. diff infection; potential of vitamin D, B12 and magnesium malabsorption that requires chronic monitoring; potential for causing osteoporosis and the need for monitoring. Long-term monitoring may be performed through the patient's PCP if preferred. Lastly, I discussed a recent study that showed a possible association between chronic PPI use and increase risk of dementia and overall mortality.  The validity of these adverse events is uncertain.  We discussed the potential risks of use of these medicines with the benefit of their use. We discussed the option of discontinuation.  There obviously is a risk in both taking the medicines as well as stopping the medicines in certain disease processes. All questions were answered to the best of my ability.  PLAN: Hemoglobin, iron, ferritin, B12, and transferrin saturation Complete omeprazole 40 mg BID x 8 weeks, then reduce to 40 mg daily Avoid all NSAIDs Continue B12 supplements Calcium and vitamin D supplementation  recommendation Surveillance colonoscopy in June 2021 Return to clinic before then with any additional questions or concerns  Please see the "Patient Instructions" section for addition details about the plan.  HPI: Amy Reyes is a 68 y.o. female initially seen in consultation on 05/15/2018 for bright red blood per  rectum.  Interval history is obtained through the patient and review of her electronic health record.  Colonoscopy 07/13/18 revealed hemorrhoids. A 18 mm cecal tubular adenoma was found deeply wedged behind the IC valve. The polyp was removed with a piecemeal technique using a hot snare. Additional small tubular adenomas were also removed: (17mm ascending, 15mm ascending, 39mm sigmoid). Surveillance recommended in 6 months given the piecemeal technique.    Colonoscopy 12/22/18 revealed hemorrhoids. Diverticulosis in the sigmoid and descending colon. One 1 mm residual polyp in the cecum,one 3 mm polyp at the hepatic flexure, three 1 to 3 mm polyps at the hepatic flexure, one 3 mm polyp at the splenic flexure. Colonoscopy recommended in one year.   EGD 12/22/18 revealed LA Grade C reflux esophagitis with biopsy showing severe acute cardio esophagitis with ulceration.  No viral changes seen.  Biopsies negative for fungus.  Gastric biopsies were normal.  Duodenal biopsies showed duodenitis.  She has done well following endoscopy.  No further rectal bleeding following her colonoscopy.  GI symptoms have resolved on omeprazole 40 mg BID.  She has no new complaints or concerns.  She has been trying to avoid all NSAIDs.  Continues to take iron supplements and B12 supplements daily.   Labs from 11/30/18: hgb 10.5, MCV 86, RDW 14.5, platelets 220  Past Medical History:  Diagnosis Date   Allergy    Anemia    iron defiencey   Arthritis    Bronchitis    Chronic kidney disease    Colon polyps    Complication of anesthesia    slow to wake up   Diabetes mellitus without complication ([place])    borderline type 2 on metformin   History of kidney stones    Hypercholesteremia    Hypertension    Peptic ulcer     Past Surgical History:  Procedure Laterality Date   BREAST SURGERY     reduction   CHOLECYSTECTOMY     COLONOSCOPY     INJECTION KNEE Left 10/14/2016   Procedure: CORTISONE LEFT KNEE  INJECTION;  Surgeon: Gaynelle Arabian, MD;  Location: [facility];  Service: Orthopedics;  Laterality: Left;   LAPAROSCOPIC GASTRIC BANDING     TOTAL KNEE ARTHROPLASTY Right 10/14/2016   Procedure: RIGHT TOTAL KNEE ARTHROPLASTY;  Surgeon: Gaynelle Arabian, MD;  Location: [facility];  Service: Orthopedics;  Laterality: Right;  Adductor Block   tummy tuck      Current Outpatient Medications  Medication Sig Dispense Refill   atorvastatin (LIPITOR) 40 MG tablet Take 40 mg by mouth daily.  3   cholecalciferol (VITAMIN D) 1000 units tablet Take 1,000 Units by mouth daily.     hydrocortisone (ANUSOL-HC) 2.5 % rectal cream Place 1 application rectally 2 (two) times daily. Apply cortisone cream to a glycerin suppository and insert rectally twice a day for 7 days. 30 g 1   losartan-hydrochlorothiazide (HYZAAR) 100-25 MG tablet Take 1 tablet by mouth daily.     metFORMIN (GLUCOPHAGE) 500 MG tablet Take 500 mg by mouth daily.     Multiple Vitamin (MULTIVITAMIN WITH MINERALS) TABS tablet Take 1 tablet by mouth daily.     omeprazole (PRILOSEC) 40 MG capsule TAKE 1 CAPSULE BY  MOUTH TWICE A DAY 60 capsule 1   No current facility-administered medications for this visit.     Allergies as of 01/22/2019 - Review Complete 01/22/2019  Allergen Reaction Noted   Tramadol Itching 10/14/2016   Codeine  03/01/2018    Family History  Problem Relation Age of Onset   Hypertension Mother    Thyroid disease Mother    Kidney disease Sister    Colon cancer Sister    Diabetes Maternal Grandfather    Diabetes Maternal Aunt    Diabetes Maternal Uncle    Prostate cancer Father    Esophageal cancer Neg Hx    Rectal cancer Neg Hx     Social History   Socioeconomic History   Marital status: Legally Separated    Spouse name: 2   Number of children: Not on file   Years of education: Not on file   Highest education level: Not on file  Occupational History   Occupation: retired  Airline pilot strain: Not on file   Food insecurity    Worry: Not on file    Inability: Not on Lexicographer needs    Medical: Not on file    Non-medical: Not on file  Tobacco Use   Smoking status: Former Smoker    Types: Cigarettes    Quit date: 05/16/1983    Years since quitting: 35.7   Smokeless tobacco: Never Used  Substance and Sexual Activity   Alcohol use: Yes    Comment: wine weekly   Drug use: No   Sexual activity: Yes  Lifestyle   Physical activity    Days per week: Not on file    Minutes per session: Not on file   Stress: Not on file  Relationships   Social connections    Talks on phone: Not on file    Gets together: Not on file    Attends religious service: Not on file    Active member of club or organization: Not on file    Attends meetings of clubs or organizations: Not on file    Relationship status: Not on file   Intimate partner violence    Fear of current or ex partner: Not on file    Emotionally abused: Not on file    Physically abused: Not on file    Forced sexual activity: Not on file  Other Topics Concern   Not on file  Social History Narrative   Not on file    Review of Systems: ALL ROS discussed and all others negative except listed in HPI.  Physical Exam: Complete physical exam not performed due to the limits inherent in a telehealth encounter.  General: Awake, alert, and oriented, and well communicative. In no acute distress.  HEENT: EOMI, non-icteric sclera, NCAT, MMM  Neck: Normal movement of head and neck  Pulm: No labored breathing, speaking in full sentences without conversational dyspnea  Derm: No apparent lesions or bruising in visible field  MS: Moves all visible extremities without noticeable abnormality  Psych: Pleasant, cooperative, normal speech, normal affect and normal insight Neuro: Alert and appropriate   Jillien Yakel L. Tarri Glenn, MD, MPH [facility] 01/22/2019, 12:51 PM

## 2019-01-28 ENCOUNTER — Other Ambulatory Visit: Payer: Self-pay | Admitting: Gastroenterology

## 2019-02-17 ENCOUNTER — Other Ambulatory Visit (INDEPENDENT_AMBULATORY_CARE_PROVIDER_SITE_OTHER): Payer: Medicare HMO

## 2019-02-17 DIAGNOSIS — D508 Other iron deficiency anemias: Secondary | ICD-10-CM | POA: Diagnosis not present

## 2019-02-17 DIAGNOSIS — E538 Deficiency of other specified B group vitamins: Secondary | ICD-10-CM

## 2019-02-17 DIAGNOSIS — K209 Esophagitis, unspecified without bleeding: Secondary | ICD-10-CM

## 2019-02-17 DIAGNOSIS — K625 Hemorrhage of anus and rectum: Secondary | ICD-10-CM | POA: Diagnosis not present

## 2019-02-17 LAB — VITAMIN B12: Vitamin B-12: 861 pg/mL (ref 211–911)

## 2019-02-17 LAB — HEMOGLOBIN: Hemoglobin: 11.6 g/dL — ABNORMAL LOW (ref 12.0–15.0)

## 2019-02-17 LAB — IRON: Iron: 48 ug/dL (ref 42–145)

## 2019-02-17 LAB — FERRITIN: Ferritin: 16.8 ng/mL (ref 10.0–291.0)

## 2019-02-19 LAB — TRANSFERRIN SATURATION
IRON SATN MFR SERPL: 13 % Saturation — ABNORMAL LOW
IRON SERPL-MCNC: 49 ug/dL
TRANSFERRIN SERPL-MCNC: 268 mg/dL

## 2019-03-01 ENCOUNTER — Telehealth: Payer: Self-pay | Admitting: Gastroenterology

## 2019-03-01 NOTE — Telephone Encounter (Signed)
Patient notified of results.

## 2019-03-18 DIAGNOSIS — Z4651 Encounter for fitting and adjustment of gastric lap band: Secondary | ICD-10-CM | POA: Diagnosis not present

## 2019-04-26 ENCOUNTER — Other Ambulatory Visit: Payer: Self-pay

## 2019-04-26 ENCOUNTER — Emergency Department (HOSPITAL_COMMUNITY)
Admission: EM | Admit: 2019-04-26 | Discharge: 2019-04-26 | Disposition: A | Discharge status: Home / Self Care | Payer: Medicare HMO | Attending: Emergency Medicine | Admitting: Emergency Medicine

## 2019-04-26 ENCOUNTER — Emergency Department (HOSPITAL_COMMUNITY): Payer: Medicare HMO

## 2019-04-26 ENCOUNTER — Encounter (HOSPITAL_COMMUNITY): Payer: Self-pay | Admitting: Emergency Medicine

## 2019-04-26 DIAGNOSIS — Y929 Unspecified place or not applicable: Secondary | ICD-10-CM | POA: Insufficient documentation

## 2019-04-26 DIAGNOSIS — Y999 Unspecified external cause status: Secondary | ICD-10-CM | POA: Insufficient documentation

## 2019-04-26 DIAGNOSIS — W19XXXA Unspecified fall, initial encounter: Secondary | ICD-10-CM

## 2019-04-26 DIAGNOSIS — S42152A Displaced fracture of neck of scapula, left shoulder, initial encounter for closed fracture: Secondary | ICD-10-CM | POA: Insufficient documentation

## 2019-04-26 DIAGNOSIS — W07XXXA Fall from chair, initial encounter: Secondary | ICD-10-CM | POA: Diagnosis not present

## 2019-04-26 DIAGNOSIS — I129 Hypertensive chronic kidney disease with stage 1 through stage 4 chronic kidney disease, or unspecified chronic kidney disease: Secondary | ICD-10-CM | POA: Diagnosis not present

## 2019-04-26 DIAGNOSIS — S46002A Unspecified injury of muscle(s) and tendon(s) of the rotator cuff of left shoulder, initial encounter: Secondary | ICD-10-CM

## 2019-04-26 DIAGNOSIS — N189 Chronic kidney disease, unspecified: Secondary | ICD-10-CM | POA: Insufficient documentation

## 2019-04-26 DIAGNOSIS — S42142A Displaced fracture of glenoid cavity of scapula, left shoulder, initial encounter for closed fracture: Secondary | ICD-10-CM | POA: Insufficient documentation

## 2019-04-26 DIAGNOSIS — Z7984 Long term (current) use of oral hypoglycemic drugs: Secondary | ICD-10-CM | POA: Insufficient documentation

## 2019-04-26 DIAGNOSIS — Z87891 Personal history of nicotine dependence: Secondary | ICD-10-CM | POA: Diagnosis not present

## 2019-04-26 DIAGNOSIS — Y939 Activity, unspecified: Secondary | ICD-10-CM | POA: Diagnosis not present

## 2019-04-26 DIAGNOSIS — E1122 Type 2 diabetes mellitus with diabetic chronic kidney disease: Secondary | ICD-10-CM | POA: Diagnosis not present

## 2019-04-26 DIAGNOSIS — Y92009 Unspecified place in unspecified non-institutional (private) residence as the place of occurrence of the external cause: Secondary | ICD-10-CM

## 2019-04-26 DIAGNOSIS — Z79899 Other long term (current) drug therapy: Secondary | ICD-10-CM | POA: Insufficient documentation

## 2019-04-26 DIAGNOSIS — S4992XA Unspecified injury of left shoulder and upper arm, initial encounter: Secondary | ICD-10-CM | POA: Diagnosis not present

## 2019-04-26 MED ORDER — HYDROCODONE-ACETAMINOPHEN 5-325 MG PO TABS
1.0000 | ORAL_TABLET | Freq: Once | ORAL | Status: AC
  Administered 2019-04-26: 1 via ORAL
  Filled 2019-04-26: qty 1

## 2019-04-26 MED ORDER — ONDANSETRON HCL 4 MG PO TABS: 4.0000 mg | ORAL_TABLET | Freq: Three times a day (TID) | ORAL | 0 refills | Status: DC | PRN

## 2019-04-26 MED ORDER — HYDROCODONE-ACETAMINOPHEN 5-325 MG PO TABS: 1.0000 | ORAL_TABLET | Freq: Four times a day (QID) | ORAL | 0 refills | Status: DC | PRN

## 2019-04-26 MED ORDER — ONDANSETRON HCL 4 MG PO TABS
4.0000 mg | ORAL_TABLET | Freq: Once | ORAL | Status: AC
  Administered 2019-04-26: 4 mg via ORAL
  Filled 2019-04-26: qty 1

## 2019-04-26 NOTE — ED Triage Notes (Signed)
Pt states she fell off of a chair x 1 hour ago. States she landed on her left arm. Pt states she is unable to lift her arm above her head or forwards. She can lift objects "depending on the position of my arm."

## 2019-04-26 NOTE — ED Provider Notes (Signed)
[facility] EMERGENCY DEPARTMENT Provider Note   CSN: [id number] Arrival date & time: 04/26/19  0037   Time seen 2:43 AM  History   Chief Complaint Chief Complaint  Patient presents with  . Arm Pain    HPI Amy Reyes is a 68 y.o. female.     HPI patient states she has been paining her apartment and she was on the last room.  She states she has been stepping on some flat seated bar stool type chairs with cushions on them.  She states she feels uncomfortable using a ladder.  When she stepped on the last chair the cushion slipped and her left foot went through the chair and she fell with her left arm out.  She states she may have hit the left side of her head but she did not get loss of consciousness and has no headache.  She states that she is right-handed.  Since the fall she is unable to lift her left arm away from her body without having a lot of pain in her shoulder.  She denies any numbness in her hands or fingers.  She states she sees Dr. Wynelle Link with [facility].  PCP Tisovec, Fransico Him, MD Orthopedics Dr. Wynelle Link  Past Medical History:  Diagnosis Date  . Allergy   . Anemia    iron defiencey  . Arthritis   . Bronchitis   . Chronic kidney disease   . Colon polyps   . Complication of anesthesia    slow to wake up  . Diabetes mellitus without complication ([place])    borderline type 2 on metformin  . History of kidney stones   . Hypercholesteremia   . Hypertension   . Peptic ulcer     Patient Active Problem List   Diagnosis Date Noted  . OA (osteoarthritis) of knee 10/14/2016    Past Surgical History:  Procedure Laterality Date  . BREAST SURGERY     reduction  . CHOLECYSTECTOMY    . COLONOSCOPY    . INJECTION KNEE Left 10/14/2016   Procedure: CORTISONE LEFT KNEE INJECTION;  Surgeon: Gaynelle Arabian, MD;  Location: [facility];  Service: Orthopedics;  Laterality: Left;  . LAPAROSCOPIC GASTRIC BANDING    . TOTAL KNEE ARTHROPLASTY Right 10/14/2016   Procedure:  RIGHT TOTAL KNEE ARTHROPLASTY;  Surgeon: Gaynelle Arabian, MD;  Location: [facility];  Service: Orthopedics;  Laterality: Right;  Adductor Block  . tummy tuck       OB History   No obstetric history on file.      Home Medications    Prior to Admission medications   Medication Sig Start Date End Date Taking? Authorizing Provider  atorvastatin (LIPITOR) 40 MG tablet Take 40 mg by mouth daily. 12/27/17   [provider]  cholecalciferol (VITAMIN D) 1000 units tablet Take 1,000 Units by mouth daily.    [provider]  HYDROcodone-acetaminophen (NORCO/VICODIN) 5-325 MG tablet Take 1 tablet by mouth every 6 (six) hours as needed. 04/26/19   Rolland Porter, MD  hydrocortisone (ANUSOL-HC) 2.5 % rectal cream Place 1 application rectally 2 (two) times daily. Apply cortisone cream to a glycerin suppository and insert rectally twice a day for 7 days. 07/16/18   Thornton Park, MD  losartan-hydrochlorothiazide (HYZAAR) 100-25 MG tablet Take 1 tablet by mouth daily. 09/08/16   [provider]  metFORMIN (GLUCOPHAGE) 500 MG tablet Take 500 mg by mouth daily. 09/09/16   [provider]  Multiple Vitamin (MULTIVITAMIN WITH MINERALS) TABS tablet Take 1  tablet by mouth daily.    [provider]  omeprazole (PRILOSEC) 40 MG capsule TAKE 1 CAPSULE BY MOUTH TWICE A DAY 01/28/19   Thornton Park, MD  ondansetron (ZOFRAN) 4 MG tablet Take 1 tablet (4 mg total) by mouth every 8 (eight) hours as needed for nausea or vomiting. 04/26/19   Rolland Porter, MD    Family History Family History  Problem Relation Age of Onset  . Hypertension Mother   . Thyroid disease Mother   . Kidney disease Sister   . Colon cancer Sister   . Diabetes Maternal Grandfather   . Diabetes Maternal Aunt   . Diabetes Maternal Uncle   . Prostate cancer Father   . Esophageal cancer Neg Hx   . Rectal cancer Neg Hx     Social History Social History   Tobacco Use  . Smoking status: Former Smoker     Types: Cigarettes    Quit date: 05/16/1983    Years since quitting: 35.9  . Smokeless tobacco: Never Used  Substance Use Topics  . Alcohol use: Yes    Comment: wine weekly  . Drug use: No     Allergies   Tramadol and Codeine   Review of Systems Review of Systems  All other systems reviewed and are negative.    Physical Exam Updated Vital Signs BP (!) 172/90   Pulse 66   Temp 97.7 F (36.5 C)   Resp 18   Ht 5\' 6"  (1.676 m)   Wt 95.3 kg   SpO2 98%   BMI 33.89 kg/m   Physical Exam Vitals signs and nursing note reviewed.  Constitutional:      Appearance: Normal appearance.  HENT:     Head: Normocephalic and atraumatic.     Right Ear: External ear normal.     Left Ear: External ear normal.     Nose: Nose normal.  Eyes:     Extraocular Movements: Extraocular movements intact.     Conjunctiva/sclera: Conjunctivae normal.  Neck:     Musculoskeletal: Normal range of motion.  Cardiovascular:     Rate and Rhythm: Normal rate.  Pulmonary:     Effort: Pulmonary effort is normal. No respiratory distress.  Musculoskeletal:        General: Tenderness present. No deformity.     Comments: Patient is nontender to palpation over her left clavicle.  She is nontender to palpation of her left wrist or her left elbow.  There is no joint effusion noted.  She does not have pain of flexion of her elbow.  However she has pain in her left shoulder and she has pain if she tries to abduct her upper arm.  She has good distal pulses and capillary refill.  Skin:    General: Skin is warm and dry.     Findings: No bruising.  Neurological:     General: No focal deficit present.     Mental Status: She is alert and oriented to person, place, and time.     Cranial Nerves: No cranial nerve deficit.  Psychiatric:        Mood and Affect: Mood normal.        Behavior: Behavior normal.        Thought Content: Thought content normal.      ED Treatments / Results  Labs (all labs ordered are  listed, but only abnormal results are displayed) Labs Reviewed - No data to display  EKG None  Radiology Dg Shoulder Left  Result Date:  04/26/2019 CLINICAL DATA:  [place] from chair 1 hour prior, landed on left arm, unable to elevate arm. EXAM: LEFT SHOULDER - 2+ VIEW COMPARISON:  None. FINDINGS: The there is a minimally displaced fracture involving the inferior glenoid. No other acute fracture or traumatic malalignment is seen. Subcortical cystic change at the greater tuberosity may reflect chronic rotator cuff tendinopathy. Minimal acromioclavicular and glenohumeral arthrosis. Coracoclavicular and acromioclavicular intervals are maintained. Soft tissues are unremarkable. Included portions of the right chest are free of abnormality. IMPRESSION: 1. Minimally displaced fracture along the inferior margin of the glenoid may reflect a bony [place] type injury. 2. Subcortical cystic change at the greater tuberosity may reflect chronic rotator cuff tendinopathy. 3. Mild glenohumeral and acromioclavicular arthrosis. Electronically Signed   By: Lovena Le M.D.   On: 04/26/2019 01:44   Dg Humerus Left  Result Date: 04/26/2019 CLINICAL DATA:  [place] from chair, unable to elevate arm EXAM: LEFT HUMERUS - 2+ VIEW COMPARISON:  Concurrent left shoulder radiographs. FINDINGS: The left humerus is intact. A glenoid fracture seen on shoulder radiographs is not as well visualized on these images. The humeral head is normally seated within the glenoid fossa. The alignment of the elbow is grossly maintained. Soft tissues are unremarkable. Included portions of the left chest wall are free of abnormality. IMPRESSION: 1. Normal appearance of the left humerus. 2. A glenoid fracture seen on shoulder radiographs is not as well visualized on these images. Electronically Signed   By: Lovena Le M.D.   On: 04/26/2019 01:46    Procedures Procedures (including critical care time)  Medications Ordered in ED Medications   HYDROcodone-acetaminophen (NORCO/VICODIN) 5-325 MG per tablet 1 tablet (has no administration in time range)     Initial Impression / Assessment and Plan / ED Course  I have reviewed the triage vital signs and the nursing notes.  Pertinent labs & imaging results that were available during my care of the patient were reviewed by me and considered in my medical decision making (see chart for details).    I ordered hydrocodone for the patient which she has had in the past.  However when the nurse tried to give it to her she said she would prefer to have OxyContin.  I had already prescribed her hydrocodone.  She is requesting something for nausea.    Final Clinical Impressions(s) / ED Diagnoses   Final diagnoses:  Fall in home, initial encounter  Closed fracture of glenoid cavity and neck of left scapula, initial encounter  Injury of left rotator cuff, initial encounter    ED Discharge Orders         Ordered    HYDROcodone-acetaminophen (NORCO/VICODIN) 5-325 MG tablet  Every 6 hours PRN     04/26/19 0323    ondansetron (ZOFRAN) 4 MG tablet  Every 8 hours PRN     04/26/19 0331        OTC ibuprofen  Plan discharge  Rolland Porter, MD, Barbette Or, MD 04/26/19 [phone]

## 2019-04-26 NOTE — Discharge Instructions (Addendum)
Use ice packs for comfort.  Take ibuprofen 40 mg 4 times a day as needed for pain.  Take the hydrocodone for pain not relieved by the ibuprofen.  Wear the sling for comfort.  Please call Dr. Anne Fu office to get a follow-up appointment in about 7 to 10 days.  You have a "minimally displaced fracture along the inferior margin of the glenoid" in your left shoulder.  I also suspect you may have a rotator cuff injury.

## 2019-04-27 DIAGNOSIS — M25512 Pain in left shoulder: Secondary | ICD-10-CM | POA: Diagnosis not present

## 2019-04-28 ENCOUNTER — Other Ambulatory Visit: Payer: Self-pay | Admitting: Internal Medicine

## 2019-04-28 DIAGNOSIS — Z1231 Encounter for screening mammogram for malignant neoplasm of breast: Secondary | ICD-10-CM

## 2019-05-07 DIAGNOSIS — M25512 Pain in left shoulder: Secondary | ICD-10-CM | POA: Diagnosis not present

## 2019-05-12 DIAGNOSIS — E7800 Pure hypercholesterolemia, unspecified: Secondary | ICD-10-CM | POA: Diagnosis not present

## 2019-05-12 DIAGNOSIS — E1129 Type 2 diabetes mellitus with other diabetic kidney complication: Secondary | ICD-10-CM | POA: Diagnosis not present

## 2019-05-13 DIAGNOSIS — M75122 Complete rotator cuff tear or rupture of left shoulder, not specified as traumatic: Secondary | ICD-10-CM | POA: Diagnosis not present

## 2019-05-13 DIAGNOSIS — M25512 Pain in left shoulder: Secondary | ICD-10-CM | POA: Diagnosis not present

## 2019-05-16 HISTORY — PX: ROTATOR CUFF REPAIR: SHX139

## 2019-05-19 DIAGNOSIS — K449 Diaphragmatic hernia without obstruction or gangrene: Secondary | ICD-10-CM | POA: Diagnosis not present

## 2019-05-19 DIAGNOSIS — Z01818 Encounter for other preprocedural examination: Secondary | ICD-10-CM | POA: Diagnosis not present

## 2019-05-19 DIAGNOSIS — Z1339 Encounter for screening examination for other mental health and behavioral disorders: Secondary | ICD-10-CM | POA: Diagnosis not present

## 2019-05-19 DIAGNOSIS — N183 Chronic kidney disease, stage 3 unspecified: Secondary | ICD-10-CM | POA: Diagnosis not present

## 2019-05-19 DIAGNOSIS — E1129 Type 2 diabetes mellitus with other diabetic kidney complication: Secondary | ICD-10-CM | POA: Diagnosis not present

## 2019-05-19 DIAGNOSIS — M75102 Unspecified rotator cuff tear or rupture of left shoulder, not specified as traumatic: Secondary | ICD-10-CM | POA: Diagnosis not present

## 2019-05-19 DIAGNOSIS — D692 Other nonthrombocytopenic purpura: Secondary | ICD-10-CM | POA: Diagnosis not present

## 2019-05-19 DIAGNOSIS — Z9884 Bariatric surgery status: Secondary | ICD-10-CM | POA: Diagnosis not present

## 2019-05-19 DIAGNOSIS — E669 Obesity, unspecified: Secondary | ICD-10-CM | POA: Diagnosis not present

## 2019-05-19 DIAGNOSIS — D126 Benign neoplasm of colon, unspecified: Secondary | ICD-10-CM | POA: Diagnosis not present

## 2019-05-19 DIAGNOSIS — F321 Major depressive disorder, single episode, moderate: Secondary | ICD-10-CM | POA: Diagnosis not present

## 2019-05-19 DIAGNOSIS — D631 Anemia in chronic kidney disease: Secondary | ICD-10-CM | POA: Diagnosis not present

## 2019-05-19 DIAGNOSIS — Z0000 Encounter for general adult medical examination without abnormal findings: Secondary | ICD-10-CM | POA: Diagnosis not present

## 2019-05-19 DIAGNOSIS — E1122 Type 2 diabetes mellitus with diabetic chronic kidney disease: Secondary | ICD-10-CM | POA: Diagnosis not present

## 2019-05-28 ENCOUNTER — Other Ambulatory Visit: Payer: Self-pay | Admitting: Gastroenterology

## 2019-06-07 DIAGNOSIS — S46012A Strain of muscle(s) and tendon(s) of the rotator cuff of left shoulder, initial encounter: Secondary | ICD-10-CM | POA: Diagnosis not present

## 2019-06-07 DIAGNOSIS — G8918 Other acute postprocedural pain: Secondary | ICD-10-CM | POA: Diagnosis not present

## 2019-06-07 DIAGNOSIS — S43432A Superior glenoid labrum lesion of left shoulder, initial encounter: Secondary | ICD-10-CM | POA: Diagnosis not present

## 2019-06-09 DIAGNOSIS — M25512 Pain in left shoulder: Secondary | ICD-10-CM | POA: Diagnosis not present

## 2019-06-15 DIAGNOSIS — I129 Hypertensive chronic kidney disease with stage 1 through stage 4 chronic kidney disease, or unspecified chronic kidney disease: Secondary | ICD-10-CM | POA: Diagnosis not present

## 2019-06-15 DIAGNOSIS — R82998 Other abnormal findings in urine: Secondary | ICD-10-CM | POA: Diagnosis not present

## 2019-06-16 DIAGNOSIS — M25512 Pain in left shoulder: Secondary | ICD-10-CM | POA: Diagnosis not present

## 2019-06-23 DIAGNOSIS — M25512 Pain in left shoulder: Secondary | ICD-10-CM | POA: Diagnosis not present

## 2019-06-30 DIAGNOSIS — Z1212 Encounter for screening for malignant neoplasm of rectum: Secondary | ICD-10-CM | POA: Diagnosis not present

## 2019-07-01 DIAGNOSIS — M25512 Pain in left shoulder: Secondary | ICD-10-CM | POA: Diagnosis not present

## 2019-07-14 DIAGNOSIS — M25512 Pain in left shoulder: Secondary | ICD-10-CM | POA: Diagnosis not present

## 2019-07-30 ENCOUNTER — Other Ambulatory Visit: Payer: Medicare HMO

## 2019-07-30 DIAGNOSIS — M25512 Pain in left shoulder: Secondary | ICD-10-CM | POA: Diagnosis not present

## 2019-08-04 DIAGNOSIS — M25512 Pain in left shoulder: Secondary | ICD-10-CM | POA: Diagnosis not present

## 2019-08-12 DIAGNOSIS — M25512 Pain in left shoulder: Secondary | ICD-10-CM | POA: Diagnosis not present

## 2019-08-25 DIAGNOSIS — J209 Acute bronchitis, unspecified: Secondary | ICD-10-CM | POA: Diagnosis not present

## 2019-08-25 DIAGNOSIS — Z1152 Encounter for screening for COVID-19: Secondary | ICD-10-CM | POA: Diagnosis not present

## 2019-08-25 DIAGNOSIS — E1122 Type 2 diabetes mellitus with diabetic chronic kidney disease: Secondary | ICD-10-CM | POA: Diagnosis not present

## 2019-08-25 DIAGNOSIS — R05 Cough: Secondary | ICD-10-CM | POA: Diagnosis not present

## 2019-08-25 DIAGNOSIS — E1129 Type 2 diabetes mellitus with other diabetic kidney complication: Secondary | ICD-10-CM | POA: Diagnosis not present

## 2019-08-25 DIAGNOSIS — N183 Chronic kidney disease, stage 3 unspecified: Secondary | ICD-10-CM | POA: Diagnosis not present

## 2019-09-15 DIAGNOSIS — M25512 Pain in left shoulder: Secondary | ICD-10-CM | POA: Diagnosis not present

## 2019-09-29 DIAGNOSIS — M25512 Pain in left shoulder: Secondary | ICD-10-CM | POA: Diagnosis not present

## 2019-10-12 DIAGNOSIS — M19011 Primary osteoarthritis, right shoulder: Secondary | ICD-10-CM | POA: Diagnosis not present

## 2019-10-12 DIAGNOSIS — Z4789 Encounter for other orthopedic aftercare: Secondary | ICD-10-CM | POA: Diagnosis not present

## 2019-10-12 DIAGNOSIS — M25511 Pain in right shoulder: Secondary | ICD-10-CM | POA: Diagnosis not present

## 2019-10-27 DIAGNOSIS — M25512 Pain in left shoulder: Secondary | ICD-10-CM | POA: Diagnosis not present

## 2019-10-29 ENCOUNTER — Encounter: Payer: Self-pay | Admitting: Obstetrics & Gynecology

## 2019-10-29 DIAGNOSIS — Z79890 Hormone replacement therapy (postmenopausal): Secondary | ICD-10-CM | POA: Diagnosis not present

## 2019-10-29 DIAGNOSIS — N939 Abnormal uterine and vaginal bleeding, unspecified: Secondary | ICD-10-CM | POA: Diagnosis not present

## 2019-11-04 ENCOUNTER — Other Ambulatory Visit: Payer: Self-pay

## 2019-11-04 DIAGNOSIS — M1712 Unilateral primary osteoarthritis, left knee: Secondary | ICD-10-CM | POA: Diagnosis not present

## 2019-11-05 ENCOUNTER — Other Ambulatory Visit: Payer: Self-pay

## 2019-11-05 ENCOUNTER — Other Ambulatory Visit (HOSPITAL_COMMUNITY)
Admission: RE | Admit: 2019-11-05 | Discharge: 2019-11-05 | Disposition: A | Discharge status: Home / Self Care | Payer: Medicare HMO | Source: Ambulatory Visit | Attending: Obstetrics & Gynecology | Admitting: Obstetrics & Gynecology

## 2019-11-05 ENCOUNTER — Encounter: Payer: Self-pay | Admitting: Obstetrics & Gynecology

## 2019-11-05 ENCOUNTER — Telehealth: Payer: Self-pay | Admitting: Obstetrics & Gynecology

## 2019-11-05 ENCOUNTER — Ambulatory Visit: Payer: Medicare HMO | Admitting: Obstetrics & Gynecology

## 2019-11-05 VITALS — BP 136/78 | HR 70 | Temp 97.5°F | Resp 16 | Ht 66.75 in | Wt 216.0 lb

## 2019-11-05 DIAGNOSIS — N950 Postmenopausal bleeding: Secondary | ICD-10-CM | POA: Diagnosis not present

## 2019-11-05 DIAGNOSIS — E119 Type 2 diabetes mellitus without complications: Secondary | ICD-10-CM | POA: Diagnosis not present

## 2019-11-05 DIAGNOSIS — E7800 Pure hypercholesterolemia, unspecified: Secondary | ICD-10-CM

## 2019-11-05 DIAGNOSIS — Z87442 Personal history of urinary calculi: Secondary | ICD-10-CM | POA: Diagnosis not present

## 2019-11-05 DIAGNOSIS — I10 Essential (primary) hypertension: Secondary | ICD-10-CM | POA: Diagnosis not present

## 2019-11-05 DIAGNOSIS — Z124 Encounter for screening for malignant neoplasm of cervix: Secondary | ICD-10-CM | POA: Insufficient documentation

## 2019-11-05 NOTE — Progress Notes (Signed)
[name] Divorced Black or [place] American female here for new patient exam.  She was referred from Dr. Valere Dross.  Reports about two weeks ago she started having some low pelvic pain and cramping.  She then had some subsequent bleeding.  Bleeding/spoting continued for about two weeks.  She hasn't had any bleeding in at least 10 years ago.  Reports hx of uterine fibroids.    No LMP recorded. Patient is postmenopausal.          Sexually active: No.  The current method of family planning is post menopausal status.    Exercising: No.  exercise Smoker:  no  Health Maintenance: Pap:  Over 8yrs ago History of abnormal Pap:  no MMG:  Yrs ago Colonoscopy:  2020, Dr. Tarri Glenn, adenomatous polyps, follow up 1 year BMD:   none TDaP:  UTD per patient Pneumonia vaccine(s):  none Shingrix:   none Hep C testing: neg per patient    reports that she quit smoking about 36 years ago. Her smoking use included cigarettes. She has never used smokeless tobacco. She reports current alcohol use. She reports that she does not use drugs.  Past Medical History:  Diagnosis Date  . Allergy   . Anemia    iron defiencey  . Arthritis   . Bronchitis   . Chronic kidney disease   . Colon polyps   . Complication of anesthesia    slow to wake up  . Diabetes mellitus without complication ([place])    borderline type 2 on metformin  . History of kidney stones   . Hypercholesteremia   . Hypertension   . Peptic ulcer     Past Surgical History:  Procedure Laterality Date  . BREAST SURGERY     reduction  . CHOLECYSTECTOMY    . COLONOSCOPY    . INJECTION KNEE Left 10/14/2016   Procedure: CORTISONE LEFT KNEE INJECTION;  Surgeon: Gaynelle Arabian, MD;  Location: [facility];  Service: Orthopedics;  Laterality: Left;  . LAPAROSCOPIC GASTRIC BANDING    . TOTAL KNEE ARTHROPLASTY Right 10/14/2016   Procedure: RIGHT TOTAL KNEE ARTHROPLASTY;  Surgeon: Gaynelle Arabian, MD;  Location: [facility];  Service: Orthopedics;  Laterality:  Right;  Adductor Block  . tummy tuck      Current Outpatient Medications  Medication Sig Dispense Refill  . Ascorbic Acid (VITAMIN C PO) Take by mouth.    Marland Kitchen atorvastatin (LIPITOR) 80 MG tablet     . ELDERBERRY PO Take by mouth. + zinc    . losartan-hydrochlorothiazide (HYZAAR) 100-25 MG tablet Take 1 tablet by mouth daily.    . metFORMIN (GLUCOPHAGE) 500 MG tablet Take 500 mg by mouth daily.    . Multiple Vitamin (MULTIVITAMIN WITH MINERALS) TABS tablet Take 1 tablet by mouth daily.    Marland Kitchen omeprazole (PRILOSEC) 40 MG capsule TAKE 1 CAPSULE BY MOUTH TWICE A DAY 180 capsule 2  . VITAMIN D PO Take by mouth.     No current facility-administered medications for this visit.    Family History  Problem Relation Age of Onset  . Hypertension Mother   . Thyroid disease Mother   . Kidney disease Sister   . Hypertension Sister   . Diabetes Maternal Grandfather   . Diabetes Maternal Aunt   . Diabetes Maternal Uncle   . Prostate cancer Father   . Hypertension Brother     Review of Systems  Constitutional: Negative.   HENT: Negative.   Eyes: Negative.   Respiratory: Negative.   Cardiovascular:  Negative.   Gastrointestinal: Negative.   Endocrine: Negative.   Genitourinary: Positive for vaginal bleeding.  Musculoskeletal: Negative.   Skin: Negative.   Allergic/Immunologic: Negative.   Neurological: Negative.   Psychiatric/Behavioral: Negative.     Exam:   BP 136/78   Pulse 70   Temp (!) 97.5 F (36.4 C) (Skin)   Resp 16   Ht 5' 6.75" (1.695 m)   Wt 216 lb (98 kg)   BMI 34.08 kg/m   Height: 5' 6.75" (169.5 cm)  General appearance: alert, cooperative and appears stated age Head: Normocephalic, without obvious abnormality, atraumatic Neck: no adenopathy, supple, symmetrical, trachea midline and thyroid normal to inspection and palpation Lungs: clear to auscultation bilaterally Breasts: normal appearance, no masses or tenderness Heart: regular rate and rhythm Abdomen: soft,  non-tender; bowel sounds normal; no masses,  no organomegaly, lap band port noted Extremities: extremities normal, atraumatic, no cyanosis or edema Skin: Skin color, texture, turgor normal. No rashes or lesions Lymph nodes: Cervical, supraclavicular, and axillary nodes normal. No abnormal inguinal nodes palpated Neurologic: Grossly normal   Pelvic: External genitalia:  no lesions              Urethra:  normal appearing urethra with no masses, tenderness or lesions              Bartholins and Skenes: normal                 Vagina: normal appearing vagina with normal color and discharge, no lesions              Cervix: no lesions              Pap taken: Yes.   Bimanual Exam:  Uterus:  enlarged, 12 weeks size, probable anterior fibroid noted, mobile              Adnexa: no mass, fullness, tenderness               Rectovaginal: Confirms               Anus:  normal sphincter tone, no lesions  Endometrial biopsy recommended.  Discussed with patient.  Verbal and written consent obtained.   Procedure:  Speculum placed.  Cervix visualized and cleansed with betadine prep.  A single toothed tenaculum was applied to the anterior lip of the cervix.  Endometrial pipelle was advanced through the cervix into the endometrial cavity without difficulty.  Pipelle passed to 8cm.  Suction applied and pipelle removed with good tissue sample obtained.  Tenculum removed.  No bleeding noted.  Patient tolerated procedure well.   Chaperone, Royal Hawthorn, CMA, was present for exam.  A:  PMP bleeding Enlarged uterus, c/w fibroids H/o type 2 DM, on oral agent Hypertension Elevated lipids Lap band in place, questions removal, not using  P:   Endometrial biopsy obtained today Pap smear also obtained today She will return for [facility] due to size of uterus and bleeding.   As bleeding is light, do not recommend any additional treatment for bleeding at this time

## 2019-11-05 NOTE — Progress Notes (Signed)
Patient scheduled while in office for PUS, possible SHGM on 11/11/19 at 3pm, consult to follow with Dr. Sabra Heck. Order placed for precert. Patient verbalizes understanding and is agreeable.

## 2019-11-05 NOTE — Telephone Encounter (Signed)
Call placed to convey benefits. Spoke with the patient and conveyed the benefits. Patient understands/agreeable with the benefits. Patient is aware of the cancellation policy. Appointment scheduled 11/11/19.

## 2019-11-08 ENCOUNTER — Encounter: Payer: Self-pay | Admitting: Obstetrics & Gynecology

## 2019-11-08 DIAGNOSIS — K635 Polyp of colon: Secondary | ICD-10-CM | POA: Insufficient documentation

## 2019-11-08 DIAGNOSIS — Z87442 Personal history of urinary calculi: Secondary | ICD-10-CM | POA: Insufficient documentation

## 2019-11-08 DIAGNOSIS — I10 Essential (primary) hypertension: Secondary | ICD-10-CM | POA: Insufficient documentation

## 2019-11-08 DIAGNOSIS — E7800 Pure hypercholesterolemia, unspecified: Secondary | ICD-10-CM | POA: Insufficient documentation

## 2019-11-08 DIAGNOSIS — E119 Type 2 diabetes mellitus without complications: Secondary | ICD-10-CM | POA: Insufficient documentation

## 2019-11-09 LAB — SURGICAL PATHOLOGY

## 2019-11-10 ENCOUNTER — Other Ambulatory Visit: Payer: Self-pay

## 2019-11-10 LAB — CYTOLOGY - PAP: Diagnosis: NEGATIVE

## 2019-11-11 ENCOUNTER — Other Ambulatory Visit: Payer: Self-pay | Admitting: Obstetrics & Gynecology

## 2019-11-11 ENCOUNTER — Ambulatory Visit (INDEPENDENT_AMBULATORY_CARE_PROVIDER_SITE_OTHER): Payer: Medicare HMO

## 2019-11-11 ENCOUNTER — Ambulatory Visit: Payer: Medicare HMO | Admitting: Obstetrics & Gynecology

## 2019-11-11 ENCOUNTER — Encounter: Payer: Self-pay | Admitting: Obstetrics & Gynecology

## 2019-11-11 VITALS — BP 128/68 | HR 68 | Temp 97.5°F | Resp 16 | Wt 212.0 lb

## 2019-11-11 DIAGNOSIS — N950 Postmenopausal bleeding: Secondary | ICD-10-CM

## 2019-11-11 DIAGNOSIS — D251 Intramural leiomyoma of uterus: Secondary | ICD-10-CM | POA: Diagnosis not present

## 2019-11-11 DIAGNOSIS — N9489 Other specified conditions associated with female genital organs and menstrual cycle: Secondary | ICD-10-CM | POA: Diagnosis not present

## 2019-11-11 DIAGNOSIS — R9389 Abnormal findings on diagnostic imaging of other specified body structures: Secondary | ICD-10-CM | POA: Diagnosis not present

## 2019-11-11 MED ORDER — MEDROXYPROGESTERONE ACETATE 5 MG PO TABS: 5.0000 mg | ORAL_TABLET | Freq: Every day | ORAL | 0 refills | Status: DC

## 2019-11-11 NOTE — Progress Notes (Signed)
69 y.o. Divorced [place] American female here for a pelvic ultrasound with sonohystogram due to PMP bleeding.  Pt has enlarged uterus on physical exam c/w fibroids.  Pt was advised she had fibroids in her 11's.  Had two opinions about what to do for treatment.  Bleeding was manageable and was advised if she went through menopause, she would not need any treatment.  She went into menopause in her early 64's.  She hasn't had any bleeding since that time until about three weeks ago.  She is still having daily red bleeding.  It is not heavy.  She does not have clots.    No LMP recorded. Patient is postmenopausal.  Contraception:PMP  Technique:  Both transabdominal and transvaginal ultrasound examinations of the pelvis were performed. Transabdominal technique was performed for global imaging of the pelvis including uterus, ovaries, adnexal regions, and pelvic cul-de-sac.  It was necessary to proceed with endovaginal exam following the abdominal ultrasound transabdominal exam to visualize the endometrium and adnexa.  Color and duplex Doppler ultrasound was utilized to evaluate blood flow to the ovaries.    FINDINGS: Uterus: 12.9 x 7.5 x 8.2 cm with multiple fibroids.  The largest is anterior and measures 5.3 x 5.6 cm.  She also has a 3.1 cm, 2.4 cm, 3.1 cm, and 0.9 cm fibroids also noted today Endometrium: 13.3 mm Adnexa:  Left: 1.9 x 0.8 x 0.9 cm     Right: 2.7 x 2.0 x 2.1 cm with simple appearing 1.6 x 1.2 cm cyst Cul de sac: No free fluid  SHSG:  After obtaining appropriate verbal consent from patient, the cervix was visualized using a speculum, and prepped with betadine.  A tenaculum  was applied to the cervix.  Dilation of the cervix was not necessary. The catheter was passed into the uterus and sterile saline introduced, with the following findings: 3.8 x 1.4 cm filling defect consistent with polyp.     Findings reviewed with pt.  Due to bleeding and lesion within the endometrium, hysteroscopy with  probable polyp resection, D&C recommended.  Patient is going to [place] for for 5 days neck Saturday and would like this done next week for she leaves if at all possible.  I reviewed the surgery schedule and this is not possible.  We will tentatively plan for May 16 when she returns from her trip to [place].    Procedure discussed with patient.  Recovery and pain management discussed.  Risks discussed including but not limited to bleeding, rare risk of transfusion, infection, 1% risk of uterine perforation with risks of fluid deficit causing cardiac arrythmia, cerebral swelling and/or need to stop procedure early.  Fluid emboli and rare risk of death discussed.  DVT/PE, rare risk of risk of bowel/bladder/ureteral/vascular injury.  Patient aware if pathology abnormal she may need additional treatment.  All questions answered.    Patient is aware that I do not think we need to do anything about the fibroids.  I am doubtful this is the cause of bleeding.  She is also aware there is no cancer risk but the presence of fibroids and if I get the bleeding stopped, she will not need additional follow-up due to her fibroids.  Assessment:  PMP bleeding Uterine fibroids 3.4cm endometrial lesion Hypertension GERD  Plan:  Hysteroscopy with resection of endometrial lesion, D&C will be planned. Provera 5mg  daily given to see if will help bleeding.  I am doubtful this will help but pt desired to try something due to upcoming trip.  About 35 minutes total spent with pt

## 2019-11-12 ENCOUNTER — Telehealth: Payer: Self-pay

## 2019-11-12 DIAGNOSIS — N9489 Other specified conditions associated with female genital organs and menstrual cycle: Secondary | ICD-10-CM | POA: Insufficient documentation

## 2019-11-12 DIAGNOSIS — D251 Intramural leiomyoma of uterus: Secondary | ICD-10-CM | POA: Insufficient documentation

## 2019-11-12 NOTE — Telephone Encounter (Signed)
Call back to patient regarding surgery date options.  After review, patient prefers 12-06-19 so she can be at [facility]. Advised she will be called next week regarding benefits and confirming date and completing instructions.   Routing to [facility] RN and [employer] for completion.

## 2019-11-12 NOTE — Telephone Encounter (Signed)
Patient is calling back to confirm surgery.

## 2019-11-12 NOTE — Telephone Encounter (Signed)
Patient is calling to confirm surgery date. Patient would like a call back today (11/12/19).

## 2019-11-15 ENCOUNTER — Ambulatory Visit: Payer: Medicare HMO | Attending: Family

## 2019-11-16 NOTE — Telephone Encounter (Signed)
Spoke with patient regarding surgery benefits. Patient acknowledges understanding of information presented. Patient is aware that benefits presented are for professional benefits only. Patient is aware of surgery cancellation policy. Patient is aware that once surgery is scheduled, the hospital will call with separate benefits. Provided patient with phone number to [facility] to find out hospital benefits before scheduling. Patient stated that she would call back after speaking with them to proceed with scheduling.   Dr. Sabra Heck - please advise on whether surgery can be done at [facility] or should to be done at [facility].  Cc: Verline Lema

## 2019-11-16 NOTE — Telephone Encounter (Signed)
Can be done either location.  I knew we had time 11/28/2019 and she wanted this done ASAP so that is why I suggested that date.  Whatever works for her after she returns from her upcoming trip is fine with me.  Thanks.

## 2019-11-17 DIAGNOSIS — Z20828 Contact with and (suspected) exposure to other viral communicable diseases: Secondary | ICD-10-CM | POA: Diagnosis not present

## 2019-11-17 DIAGNOSIS — Z03818 Encounter for observation for suspected exposure to other biological agents ruled out: Secondary | ICD-10-CM | POA: Diagnosis not present

## 2019-11-19 NOTE — Telephone Encounter (Signed)
Spoke with patient. Surgery scheduled for 12/06/2019 [facility] at 1030. COVID test scheduled for 12/02/2019 at 11:15 am at [facility] location. Pre op was done at [facility] on 11/11/2019. Patient is aware of the need to quarantine after test until surgery. 2 week post op scheduled for 12/23/2019 at 11 am with Dr.Miller. Patient is agreeable to all dates and times. Surgery instructions reviewed and mailed to verified home address on file. Patient verbalizes understanding.  Routing to provider and will close encounter.

## 2019-11-24 ENCOUNTER — Other Ambulatory Visit: Payer: Self-pay | Admitting: Obstetrics & Gynecology

## 2019-11-29 ENCOUNTER — Other Ambulatory Visit: Payer: Self-pay

## 2019-11-29 ENCOUNTER — Encounter (HOSPITAL_BASED_OUTPATIENT_CLINIC_OR_DEPARTMENT_OTHER): Payer: Self-pay | Admitting: Obstetrics & Gynecology

## 2019-11-29 NOTE — Progress Notes (Addendum)
Spoke w/ via phone for pre-op interview---patient Lab needs dos----    none          Lab results------has lab appt for cbc, bmet, ekg 12-02-2019@1030  am COVID test ------12-02-2019 1115 am Arrive at -------820 am  NPO after ------midnight Medications to take morning of surgery -----atorvastatin, omeprazole Diabetic medication -----none day of surgery Patient Special Instructions -----none Pre-Op special Istructions -----none Patient verbalized understanding of instructions that were given at this phone interview. Patient denies shortness of breath, chest pain, fever, cough a this phone interview.

## 2019-11-30 ENCOUNTER — Telehealth: Payer: Self-pay | Admitting: Obstetrics & Gynecology

## 2019-11-30 NOTE — Telephone Encounter (Signed)
Patient would like to go over times and location for pre surgery testing.

## 2019-11-30 NOTE — Progress Notes (Signed)
Spoke with patient and reviewed all pre op instructions.

## 2019-11-30 NOTE — Telephone Encounter (Signed)
Spoke with patient. Patient spoke with PAT nurse and has date, time and location for PAT. No further questions at this time. Encounter closed.

## 2019-12-01 ENCOUNTER — Other Ambulatory Visit: Payer: Self-pay | Admitting: Orthopedic Surgery

## 2019-12-01 DIAGNOSIS — M25511 Pain in right shoulder: Secondary | ICD-10-CM

## 2019-12-02 ENCOUNTER — Other Ambulatory Visit (HOSPITAL_COMMUNITY)
Admission: RE | Admit: 2019-12-02 | Discharge: 2019-12-02 | Disposition: A | Discharge status: Home / Self Care | Payer: Medicare HMO | Source: Ambulatory Visit | Attending: Obstetrics & Gynecology | Admitting: Obstetrics & Gynecology

## 2019-12-02 ENCOUNTER — Other Ambulatory Visit: Payer: Self-pay

## 2019-12-02 ENCOUNTER — Encounter (HOSPITAL_BASED_OUTPATIENT_CLINIC_OR_DEPARTMENT_OTHER)
Admission: RE | Admit: 2019-12-02 | Discharge: 2019-12-02 | Disposition: A | Discharge status: Home / Self Care | Payer: Medicare HMO | Source: Ambulatory Visit | Attending: Obstetrics & Gynecology | Admitting: Obstetrics & Gynecology

## 2019-12-02 DIAGNOSIS — Z01818 Encounter for other preprocedural examination: Secondary | ICD-10-CM | POA: Diagnosis not present

## 2019-12-02 DIAGNOSIS — Z20822 Contact with and (suspected) exposure to covid-19: Secondary | ICD-10-CM | POA: Diagnosis not present

## 2019-12-02 DIAGNOSIS — I10 Essential (primary) hypertension: Secondary | ICD-10-CM | POA: Diagnosis not present

## 2019-12-02 LAB — CBC
HCT: 33.9 % — ABNORMAL LOW (ref 36.0–46.0)
Hemoglobin: 10.4 g/dL — ABNORMAL LOW (ref 12.0–15.0)
MCH: 26.3 pg (ref 26.0–34.0)
MCHC: 30.7 g/dL (ref 30.0–36.0)
MCV: 85.8 fL (ref 80.0–100.0)
Platelets: 257 10*3/uL (ref 150–400)
RBC: 3.95 MIL/uL (ref 3.87–5.11)
RDW: 16.5 % — ABNORMAL HIGH (ref 11.5–15.5)
WBC: 8.2 10*3/uL (ref 4.0–10.5)
nRBC: 0 % (ref 0.0–0.2)

## 2019-12-02 LAB — BASIC METABOLIC PANEL
Anion gap: 9 (ref 5–15)
BUN: 15 mg/dL (ref 8–23)
CO2: 29 mmol/L (ref 22–32)
Calcium: 9.4 mg/dL (ref 8.9–10.3)
Chloride: 104 mmol/L (ref 98–111)
Creatinine, Ser: 0.94 mg/dL (ref 0.44–1.00)
GFR calc Af Amer: >60 mL/min (ref >60)
GFR calc non Af Amer: >60 mL/min (ref >60)
Glucose, Bld: 95 mg/dL (ref 70–99)
Potassium: 4.6 mmol/L (ref 3.5–5.1)
Sodium: 142 mmol/L (ref 135–145)

## 2019-12-03 LAB — SARS CORONAVIRUS 2 (TAT 6-24 HRS): SARS Coronavirus 2: NEGATIVE

## 2019-12-06 ENCOUNTER — Ambulatory Visit (HOSPITAL_BASED_OUTPATIENT_CLINIC_OR_DEPARTMENT_OTHER): Admission: RE | Admit: 2019-12-06 | Payer: Medicare HMO | Source: Home / Self Care | Admitting: Obstetrics & Gynecology

## 2019-12-06 HISTORY — DX: Gastro-esophageal reflux disease without esophagitis: K21.9

## 2019-12-06 HISTORY — DX: Postmenopausal bleeding: N95.0

## 2019-12-06 SURGERY — DILATATION & CURETTAGE/HYSTEROSCOPY WITH MYOSURE
Anesthesia: Choice

## 2019-12-07 ENCOUNTER — Other Ambulatory Visit: Payer: Self-pay | Admitting: Obstetrics & Gynecology

## 2019-12-09 ENCOUNTER — Telehealth: Payer: Self-pay | Admitting: *Deleted

## 2019-12-09 NOTE — Telephone Encounter (Signed)
Spoke with patient. Patient would like to reschedule surgery to 12/20/2019 or 12/27/2019. Patient will speak with her daughter and return call.

## 2019-12-09 NOTE — Telephone Encounter (Signed)
Patient would like to reschedule surgery

## 2019-12-10 NOTE — Telephone Encounter (Signed)
Patient returned call. Would like to schedule surgery for 6/7.

## 2019-12-14 ENCOUNTER — Encounter (HOSPITAL_BASED_OUTPATIENT_CLINIC_OR_DEPARTMENT_OTHER): Payer: Self-pay | Admitting: Obstetrics & Gynecology

## 2019-12-14 NOTE — Telephone Encounter (Signed)
Spoke with patient. Hysteroscopy D&C Myosure polyp resection rescheduled for 12/20/2019 at 0900 at [facility]. COVID test scheduled for 12/16/2019 at 11:05 am at [facility] location. Patient is aware of the need to quarantine after test until surgery.2 week post op scheduled for 01/06/2020 at 1 pm with Dr.Miller. Patient is agreeable to all dates and times. Surgery instructions reviewed again with patient. Patient verbalizes understanding.  Routing to provider and will close encounter.

## 2019-12-15 ENCOUNTER — Encounter (HOSPITAL_BASED_OUTPATIENT_CLINIC_OR_DEPARTMENT_OTHER): Payer: Self-pay | Admitting: Obstetrics & Gynecology

## 2019-12-15 ENCOUNTER — Other Ambulatory Visit: Payer: Self-pay

## 2019-12-15 NOTE — Progress Notes (Signed)
Spoke w/ via phone for pre-op interview---patient Lab needs dos----   I stat 8           Lab results------ekg 12-02-2019 epic COVID test ------12-16-2019 at 59  Arrive at -------530 am 12-20-2019 NPO after ------midnight Medications to take morning of surgery -----pt wishes to only take omeprazole Diabetic medication -----none day of surgery Patient Special Instructions -----none Pre-Op special Istructions -----none Patient verbalized understanding of instructions that were given at this phone interview. Patient denies shortness of breath, chest pain, fever, cough a this phone interview.

## 2019-12-16 ENCOUNTER — Other Ambulatory Visit (HOSPITAL_COMMUNITY)
Admission: RE | Admit: 2019-12-16 | Discharge: 2019-12-16 | Disposition: A | Discharge status: Home / Self Care | Payer: Medicare HMO | Source: Ambulatory Visit | Attending: Obstetrics & Gynecology | Admitting: Obstetrics & Gynecology

## 2019-12-16 DIAGNOSIS — Z01812 Encounter for preprocedural laboratory examination: Secondary | ICD-10-CM | POA: Diagnosis not present

## 2019-12-16 DIAGNOSIS — Z20822 Contact with and (suspected) exposure to covid-19: Secondary | ICD-10-CM | POA: Insufficient documentation

## 2019-12-16 LAB — SARS CORONAVIRUS 2 (TAT 6-24 HRS): SARS Coronavirus 2: NEGATIVE

## 2019-12-17 ENCOUNTER — Encounter (HOSPITAL_BASED_OUTPATIENT_CLINIC_OR_DEPARTMENT_OTHER): Payer: Self-pay | Admitting: Obstetrics & Gynecology

## 2019-12-17 NOTE — Anesthesia Preprocedure Evaluation (Addendum)
Anesthesia Evaluation  Patient identified by MRN, date of birth, ID band Patient awake    Reviewed: Allergy & Precautions, NPO status , Patient's Chart, lab work & pertinent test results  Airway Mallampati: I  TM Distance: >3 FB Neck ROM: Full    Dental  (+) Partial Lower, Partial Upper, Dental Advisory Given   Pulmonary former smoker,    breath sounds clear to auscultation       Cardiovascular hypertension,  Rhythm:Regular Rate:Normal     Neuro/Psych negative neurological ROS  negative psych ROS   GI/Hepatic Neg liver ROS, PUD, GERD  ,  Endo/Other  diabetes  Renal/GU CRFRenal disease     Musculoskeletal  (+) Arthritis ,   Abdominal (+) + obese,   Peds  Hematology negative hematology ROS (+)   Anesthesia Other Findings   Reproductive/Obstetrics                            Anesthesia Physical Anesthesia Plan  ASA: II  Anesthesia Plan: General   Post-op Pain Management:    Induction: Intravenous  PONV Risk Score and Plan: 4 or greater and Ondansetron, Dexamethasone, Midazolam and Scopolamine patch - Pre-op  Airway Management Planned:   Additional Equipment: None  Intra-op Plan:   Post-operative Plan: Extubation in OR  Informed Consent: I have reviewed the patients History and Physical, chart, labs and discussed the procedure including the risks, benefits and alternatives for the proposed anesthesia with the patient or authorized representative who has indicated his/her understanding and acceptance.     Dental advisory given  Plan Discussed with: CRNA  Anesthesia Plan Comments:        Anesthesia Quick Evaluation

## 2019-12-20 ENCOUNTER — Encounter: Payer: Self-pay | Admitting: Gastroenterology

## 2019-12-20 ENCOUNTER — Ambulatory Visit (HOSPITAL_BASED_OUTPATIENT_CLINIC_OR_DEPARTMENT_OTHER): Payer: Medicare HMO | Admitting: Anesthesiology

## 2019-12-20 ENCOUNTER — Encounter (HOSPITAL_BASED_OUTPATIENT_CLINIC_OR_DEPARTMENT_OTHER): Payer: Self-pay | Admitting: Obstetrics & Gynecology

## 2019-12-20 ENCOUNTER — Other Ambulatory Visit: Payer: Self-pay | Admitting: Obstetrics & Gynecology

## 2019-12-20 ENCOUNTER — Encounter (HOSPITAL_BASED_OUTPATIENT_CLINIC_OR_DEPARTMENT_OTHER)
Admission: RE | Disposition: A | Discharge status: Home / Self Care | Payer: Self-pay | Source: Ambulatory Visit | Attending: Obstetrics & Gynecology

## 2019-12-20 ENCOUNTER — Other Ambulatory Visit: Payer: Self-pay

## 2019-12-20 ENCOUNTER — Ambulatory Visit (HOSPITAL_BASED_OUTPATIENT_CLINIC_OR_DEPARTMENT_OTHER)
Admission: RE | Admit: 2019-12-20 | Discharge: 2019-12-20 | Disposition: A | Discharge status: Home / Self Care | Payer: Medicare HMO | Source: Ambulatory Visit | Attending: Obstetrics & Gynecology | Admitting: Obstetrics & Gynecology

## 2019-12-20 DIAGNOSIS — Z7984 Long term (current) use of oral hypoglycemic drugs: Secondary | ICD-10-CM | POA: Diagnosis not present

## 2019-12-20 DIAGNOSIS — M1990 Unspecified osteoarthritis, unspecified site: Secondary | ICD-10-CM | POA: Insufficient documentation

## 2019-12-20 DIAGNOSIS — N950 Postmenopausal bleeding: Secondary | ICD-10-CM | POA: Diagnosis not present

## 2019-12-20 DIAGNOSIS — E1122 Type 2 diabetes mellitus with diabetic chronic kidney disease: Secondary | ICD-10-CM | POA: Diagnosis not present

## 2019-12-20 DIAGNOSIS — N840 Polyp of corpus uteri: Secondary | ICD-10-CM | POA: Diagnosis not present

## 2019-12-20 DIAGNOSIS — K219 Gastro-esophageal reflux disease without esophagitis: Secondary | ICD-10-CM | POA: Insufficient documentation

## 2019-12-20 DIAGNOSIS — I129 Hypertensive chronic kidney disease with stage 1 through stage 4 chronic kidney disease, or unspecified chronic kidney disease: Secondary | ICD-10-CM | POA: Diagnosis not present

## 2019-12-20 DIAGNOSIS — N858 Other specified noninflammatory disorders of uterus: Secondary | ICD-10-CM | POA: Diagnosis not present

## 2019-12-20 DIAGNOSIS — Z79899 Other long term (current) drug therapy: Secondary | ICD-10-CM | POA: Insufficient documentation

## 2019-12-20 DIAGNOSIS — R9389 Abnormal findings on diagnostic imaging of other specified body structures: Secondary | ICD-10-CM | POA: Diagnosis not present

## 2019-12-20 DIAGNOSIS — N183 Chronic kidney disease, stage 3 unspecified: Secondary | ICD-10-CM | POA: Diagnosis not present

## 2019-12-20 DIAGNOSIS — Z87891 Personal history of nicotine dependence: Secondary | ICD-10-CM | POA: Insufficient documentation

## 2019-12-20 DIAGNOSIS — D251 Intramural leiomyoma of uterus: Secondary | ICD-10-CM | POA: Diagnosis not present

## 2019-12-20 DIAGNOSIS — E7800 Pure hypercholesterolemia, unspecified: Secondary | ICD-10-CM | POA: Diagnosis not present

## 2019-12-20 DIAGNOSIS — N8500 Endometrial hyperplasia, unspecified: Secondary | ICD-10-CM | POA: Diagnosis not present

## 2019-12-20 HISTORY — PX: DILATATION & CURETTAGE/HYSTEROSCOPY WITH MYOSURE: SHX6511

## 2019-12-20 LAB — POCT I-STAT, CHEM 8
BUN: 18 mg/dL (ref 8–23)
Calcium, Ion: 1.24 mmol/L (ref 1.15–1.40)
Chloride: 103 mmol/L (ref 98–111)
Creatinine, Ser: 1.5 mg/dL — ABNORMAL HIGH (ref 0.44–1.00)
Glucose, Bld: 99 mg/dL (ref 70–99)
HCT: 35 % — ABNORMAL LOW (ref 36.0–46.0)
Hemoglobin: 11.9 g/dL — ABNORMAL LOW (ref 12.0–15.0)
Potassium: 4.1 mmol/L (ref 3.5–5.1)
Sodium: 142 mmol/L (ref 135–145)
TCO2: 29 mmol/L (ref 22–32)

## 2019-12-20 LAB — GLUCOSE, CAPILLARY: Glucose-Capillary: 108 mg/dL — ABNORMAL HIGH (ref 70–99)

## 2019-12-20 SURGERY — DILATATION & CURETTAGE/HYSTEROSCOPY WITH MYOSURE
Anesthesia: General | Site: Vagina

## 2019-12-20 MED ORDER — PROPOFOL 10 MG/ML IV BOLUS
INTRAVENOUS | Status: AC
  Filled 2019-12-20: qty 20

## 2019-12-20 MED ORDER — HYDROMORPHONE HCL 1 MG/ML IJ SOLN
INTRAMUSCULAR | Status: AC
  Filled 2019-12-20: qty 1

## 2019-12-20 MED ORDER — LIDOCAINE 2% (20 MG/ML) 5 ML SYRINGE
INTRAMUSCULAR | Status: DC | PRN
  Administered 2019-12-20: 40 mg via INTRAVENOUS

## 2019-12-20 MED ORDER — ACETAMINOPHEN 160 MG/5ML PO SOLN: 325.0000 mg | Freq: Once | ORAL | Status: DC | PRN

## 2019-12-20 MED ORDER — LACTATED RINGERS IV SOLN
INTRAVENOUS | Status: DC
  Administered 2019-12-20: 50 mL/h via INTRAVENOUS

## 2019-12-20 MED ORDER — PROPOFOL 10 MG/ML IV BOLUS
INTRAVENOUS | Status: DC | PRN
  Administered 2019-12-20: 170 mg via INTRAVENOUS

## 2019-12-20 MED ORDER — SUCCINYLCHOLINE CHLORIDE 200 MG/10ML IV SOSY
PREFILLED_SYRINGE | INTRAVENOUS | Status: AC
  Filled 2019-12-20: qty 10

## 2019-12-20 MED ORDER — LIDOCAINE 2% (20 MG/ML) 5 ML SYRINGE
INTRAMUSCULAR | Status: AC
  Filled 2019-12-20: qty 5

## 2019-12-20 MED ORDER — OXYCODONE HCL 5 MG PO TABS: 5.0000 mg | ORAL_TABLET | Freq: Once | ORAL | Status: DC | PRN

## 2019-12-20 MED ORDER — FENTANYL CITRATE (PF) 100 MCG/2ML IJ SOLN
INTRAMUSCULAR | Status: AC
  Filled 2019-12-20: qty 2

## 2019-12-20 MED ORDER — LIDOCAINE-EPINEPHRINE 1 %-1:100000 IJ SOLN
INTRAMUSCULAR | Status: DC | PRN
  Administered 2019-12-20: 10 mL

## 2019-12-20 MED ORDER — DEXAMETHASONE SODIUM PHOSPHATE 10 MG/ML IJ SOLN
INTRAMUSCULAR | Status: AC
  Filled 2019-12-20: qty 1

## 2019-12-20 MED ORDER — ONDANSETRON HCL 4 MG/2ML IJ SOLN
INTRAMUSCULAR | Status: AC
  Filled 2019-12-20: qty 2

## 2019-12-20 MED ORDER — ACETAMINOPHEN 10 MG/ML IV SOLN: 1000.0000 mg | Freq: Once | INTRAVENOUS | Status: DC | PRN

## 2019-12-20 MED ORDER — LACTATED RINGERS IV SOLN: INTRAVENOUS | Status: DC

## 2019-12-20 MED ORDER — PROMETHAZINE HCL 12.5 MG PO TABS
ORAL_TABLET | ORAL | 0 refills | Status: DC
Start: 2019-12-20 — End: 2020-01-06

## 2019-12-20 MED ORDER — FENTANYL CITRATE (PF) 250 MCG/5ML IJ SOLN
INTRAMUSCULAR | Status: DC | PRN
  Administered 2019-12-20 (×4): 25 ug via INTRAVENOUS

## 2019-12-20 MED ORDER — ONDANSETRON HCL 4 MG/2ML IJ SOLN
INTRAMUSCULAR | Status: DC | PRN
  Administered 2019-12-20: 4 mg via INTRAVENOUS

## 2019-12-20 MED ORDER — HYDROMORPHONE HCL 1 MG/ML IJ SOLN
0.2500 mg | INTRAMUSCULAR | Status: DC | PRN
  Administered 2019-12-20: 0.25 mg via INTRAVENOUS

## 2019-12-20 MED ORDER — DEXAMETHASONE SODIUM PHOSPHATE 10 MG/ML IJ SOLN
INTRAMUSCULAR | Status: DC | PRN
  Administered 2019-12-20: 4 mg via INTRAVENOUS

## 2019-12-20 MED ORDER — PHENYLEPHRINE 40 MCG/ML (10ML) SYRINGE FOR IV PUSH (FOR BLOOD PRESSURE SUPPORT)
PREFILLED_SYRINGE | INTRAVENOUS | Status: AC
  Filled 2019-12-20: qty 10

## 2019-12-20 MED ORDER — MEPERIDINE HCL 25 MG/ML IJ SOLN: 6.2500 mg | INTRAMUSCULAR | Status: DC | PRN

## 2019-12-20 MED ORDER — MIDAZOLAM HCL 5 MG/5ML IJ SOLN
INTRAMUSCULAR | Status: DC | PRN
  Administered 2019-12-20: 2 mg via INTRAVENOUS

## 2019-12-20 MED ORDER — OXYCODONE HCL 5 MG/5ML PO SOLN: 5.0000 mg | Freq: Once | ORAL | Status: DC | PRN

## 2019-12-20 MED ORDER — ACETAMINOPHEN 325 MG PO TABS: 325.0000 mg | ORAL_TABLET | Freq: Once | ORAL | Status: DC | PRN

## 2019-12-20 MED ORDER — KETOROLAC TROMETHAMINE 30 MG/ML IJ SOLN
INTRAMUSCULAR | Status: DC | PRN
Start: 2019-12-20 — End: 2019-12-20
  Administered 2019-12-20: 15 mg via INTRAVENOUS

## 2019-12-20 MED ORDER — MIDAZOLAM HCL 2 MG/2ML IJ SOLN
INTRAMUSCULAR | Status: AC
  Filled 2019-12-20: qty 2

## 2019-12-20 MED ORDER — SODIUM CHLORIDE 0.9 % IR SOLN
Status: DC | PRN
  Administered 2019-12-20: 3000 mL

## 2019-12-20 MED ORDER — KETOROLAC TROMETHAMINE 30 MG/ML IJ SOLN
INTRAMUSCULAR | Status: AC
  Filled 2019-12-20: qty 1

## 2019-12-20 MED ORDER — EPHEDRINE 5 MG/ML INJ
INTRAVENOUS | Status: AC
  Filled 2019-12-20: qty 10

## 2019-12-20 MED ORDER — HYDROCODONE-ACETAMINOPHEN 5-325 MG PO TABS: 1.0000 | ORAL_TABLET | Freq: Four times a day (QID) | ORAL | 0 refills | Status: DC | PRN

## 2019-12-20 MED ORDER — PROMETHAZINE HCL 25 MG/ML IJ SOLN: 6.2500 mg | INTRAMUSCULAR | Status: DC | PRN

## 2019-12-20 SURGICAL SUPPLY — 24 items
BIPOLAR CUTTING LOOP 21FR (ELECTRODE)
CANISTER SUCT 3000ML PPV (MISCELLANEOUS) IMPLANT
CATH ROBINSON RED A/P 16FR (CATHETERS) ×2 IMPLANT
COVER WAND RF STERILE (DRAPES) ×2 IMPLANT
DEVICE MYOSURE LITE (MISCELLANEOUS) IMPLANT
DEVICE MYOSURE REACH (MISCELLANEOUS) ×1 IMPLANT
DILATOR CANAL MILEX (MISCELLANEOUS) IMPLANT
GAUZE 4X4 16PLY RFD (DISPOSABLE) ×2 IMPLANT
GLOVE BIOGEL PI IND STRL 7.0 (GLOVE) ×1 IMPLANT
GLOVE BIOGEL PI INDICATOR 7.0 (GLOVE) ×1
GLOVE ECLIPSE 6.5 STRL STRAW (GLOVE) ×4 IMPLANT
GOWN STRL REUS W/ TWL LRG LVL3 (GOWN DISPOSABLE) ×1 IMPLANT
GOWN STRL REUS W/ TWL XL LVL3 (GOWN DISPOSABLE) ×1 IMPLANT
GOWN STRL REUS W/TWL LRG LVL3 (GOWN DISPOSABLE) ×1
GOWN STRL REUS W/TWL XL LVL3 (GOWN DISPOSABLE) ×1
IV NS IRRIG 3000ML ARTHROMATIC (IV SOLUTION) ×2 IMPLANT
KIT PROCEDURE FLUENT (KITS) ×2 IMPLANT
KIT TURNOVER CYSTO (KITS) ×2 IMPLANT
LOOP CUTTING BIPOLAR 21FR (ELECTRODE) IMPLANT
PACK VAGINAL MINOR WOMEN LF (CUSTOM PROCEDURE TRAY) ×2 IMPLANT
PAD OB MATERNITY 4.3X12.25 (PERSONAL CARE ITEMS) ×2 IMPLANT
SEAL CERVICAL OMNI LOK (ABLATOR) IMPLANT
SEAL ROD LENS SCOPE MYOSURE (ABLATOR) ×2 IMPLANT
WATER STERILE IRR 500ML POUR (IV SOLUTION) ×2 IMPLANT

## 2019-12-20 NOTE — Anesthesia Postprocedure Evaluation (Signed)
Anesthesia Post Note  Patient: Amy Reyes  Procedure(s) Performed: DILATATION & CURETTAGE/HYSTEROSCOPY WITH MYOSURE RESECTION OF POLYP (N/A Vagina )     Patient location during evaluation: PACU Anesthesia Type: General Level of consciousness: awake and alert Pain management: pain level controlled Vital Signs Assessment: post-procedure vital signs reviewed and stable Respiratory status: spontaneous breathing, nonlabored ventilation, respiratory function stable and patient connected to nasal cannula oxygen Cardiovascular status: blood pressure returned to baseline and stable Postop Assessment: no apparent nausea or vomiting Anesthetic complications: no    Last Vitals:  Vitals:   12/20/19 0845 12/20/19 0940  BP: 127/64 123/68  Pulse: 70   Resp: 11 16  Temp:  36.5 C  SpO2: 99% 97%    Last Pain:  Vitals:   12/20/19 0940  TempSrc: Oral  PainSc: 0-No pain                 Effie Berkshire

## 2019-12-20 NOTE — Op Note (Signed)
12/20/2019  8:10 AM  PATIENT:  Amy Reyes  69 y.o. female  PRE-OPERATIVE DIAGNOSIS:  Thickened endometrium, endometrial mass, postmenopausal bleeding  POST-OPERATIVE DIAGNOSIS:  Thickened endometrium, endometrial mass, postmenopausal bleeding  PROCEDURE:  Procedure(s): DILATATION & CURETTAGE/HYSTEROSCOPY WITH MYOSURE RESECTION OF POLYP  SURGEON:  Megan Salon  ASSISTANTS: OR staff   ANESTHESIA:   general  ESTIMATED BLOOD LOSS: 10 mL  BLOOD ADMINISTERED:none   FLUIDS: 1000cc LR  UOP: 75cc UOP drained with I&O cath  SPECIMEN:  Endometrial polyp and curettings  DISPOSITION OF SPECIMEN:  PATHOLOGY  FINDINGS: three polyps, largest with posterior attachment.  The largest lesion was thicker and more difficult to resect so pathology may show fibroid tissue  DESCRIPTION OF OPERATION: Patient was taken to the operating room.  She is placed in the supine position. SCDs were on her lower extremities and functioning properly. General anesthesia with an LMA was administered without difficulty. Dr. Smith Robert, anesthesia, oversaw case.  Legs were then placed in the [place] in the low lithotomy position. The legs were lifted to the high lithotomy position and the Betadine prep was used on the inner thighs perineum and vagina x3. Patient was draped in a normal standard fashion. An in and out catheterization with a red rubber Foley catheter was performed. Approximately 75 cc of clear urine was noted. A bivalve speculum was placed the vagina. The anterior lip of the cervix was grasped with single-tooth tenaculum.  A paracervical block of 1% lidocaine mixed one-to-one with epinephrine (1:100,000 units).  10 cc was used total. The cervix is dilated up to #19 [facility] dilators. The endometrial cavity sounded to 9 cm.   A Myosure hysteroscope was obtained. Normal saline was used as a hysteroscopic fluid. The hysteroscope was advanced through the endocervical canal into the endometrial cavity.  The tubal ostia were noted bilaterally. Additional findings included a large, vascular and irregularly shaped lesion that obstructed the view to two smaller polypoid appearing lesions behind.  The hysteroscope was removed. Using the [facility] reach device, the lesions/polyps were fully resected until flush with the endometrial cavity.  Before and after photo documentation was made.  The hysteroscope was then removed and a #1 toothed curette was used to curette the cavity until rough gritty texture was noted in all quadrants. At this point no other procedure was needed and this procedure was ended. The fluid deficit was 135 cc. The tenaculum was removed from the anterior lip of the cervix.  Silve nitrate was used to obtain excellent hemostasis at the tenaculum sites.  The speculum was removed from the vagina. The prep was cleansed of the patient's skin. The legs are positioned back in the supine position. Sponge, lap, needle, initially counts were correct x2. Patient was taken to recovery in stable condition.  COUNTS:  YES  PLAN OF CARE: Transfer to PACU

## 2019-12-20 NOTE — Transfer of Care (Signed)
Immediate Anesthesia Transfer of Care Note  Patient: Amy Reyes   Procedure(s) Performed: DILATATION & CURETTAGE/HYSTEROSCOPY WITH MYOSURE RESECTION OF POLYP (N/A Vagina )  Patient Location: PACU  Anesthesia Type:General  Level of Consciousness: awake, alert , oriented and patient cooperative  Airway & Oxygen Therapy: Patient Spontanous Breathing and Patient connected to face mask oxygen  Post-op Assessment: Report given to RN, Post -op Vital signs reviewed and stable and Patient moving all extremities  Post vital signs: Reviewed and stable  Last Vitals:  Vitals Value Taken Time  BP 166/77 12/20/19 0815  Temp 36.6 C 12/20/19 0814  Pulse 91 12/20/19 0814  Resp 19 12/20/19 0814  SpO2 100 % 12/20/19 0814  Vitals shown include unvalidated device data.  Last Pain:  Vitals:   12/20/19 0618  TempSrc: Oral  PainSc: 0-No pain      Patients Stated Pain Goal: 4 (19/50/93 2671)  Complications: No apparent anesthesia complications

## 2019-12-20 NOTE — H&P (Signed)
Amy Reyes is an 69 y.o. female G12P2A1 DAA female here for hysteroscopy with polyp resection, dilation and curettage due to thickened endometrium and PMP bleeding.  She does have a hx of fibroids.  12.9 x 7.5 x 82.cm with multiple fibroids.  The larges measured 5.6cm.  Endometrium is 13.41mm.  A 3.8 x 1.4cm filling defect was noted.  Due to ultrasound findings, hysteroscopy with polyp resection, D&C planned.  Procedure, risks and benefits have been discussed.  All questions answered.    Pertinent Gynecological History: Menses: post-menopausal Bleeding: post menopausal bleeding Contraception: post menopausal status DES exposure: denies Blood transfusions: none Sexually transmitted diseases: no past history Previous GYN Procedures: none  Last mammogram: pt was advised to have this done, she has not  Last pap: normal Date: 11/05/2019 OB History: G3, P2   Menstrual History: No LMP recorded. Patient is postmenopausal.    Past Medical History:  Diagnosis Date  . Allergy   . Anemia    iron defiencey  . Arthritis    oa  . Bronchitis hx of  . Chronic kidney disease    ckd stage 2- 3 due to nsiad use no nephrologist  . Colon polyps   . Complication of anesthesia    slow to wake up one time, none recent  . Diabetes mellitus without complication ([place])    borderline type 2 on metformin  . GERD (gastroesophageal reflux disease)   . History of kidney stones   . Hypercholesteremia   . Hypertension   . Peptic ulcer   . PMB (postmenopausal bleeding)    last time middle of april 2021    Past Surgical History:  Procedure Laterality Date  . ABDOMINOPLASTY  30 yrs ago   tummy tuck  . BREAST SURGERY     reduction  . CHOLECYSTECTOMY  2000   laparoscopic  . COLONOSCOPY    . INJECTION KNEE Left 10/14/2016   Procedure: CORTISONE LEFT KNEE INJECTION;  Surgeon: Gaynelle Arabian, MD;  Location: [facility];  Service: Orthopedics;  Laterality: Left;  . LAPAROSCOPIC GASTRIC BANDING  2010 or 2011   . left knee injection  6 weeks ago  . ROTATOR CUFF REPAIR Left 05/2019  . TOTAL KNEE ARTHROPLASTY Right 10/14/2016   Procedure: RIGHT TOTAL KNEE ARTHROPLASTY;  Surgeon: Gaynelle Arabian, MD;  Location: [facility];  Service: Orthopedics;  Laterality: Right;  Adductor Block    Family History  Problem Relation Age of Onset  . Hypertension Mother   . Thyroid disease Mother   . Kidney disease Sister   . Hypertension Sister   . Diabetes Maternal Grandfather   . Diabetes Maternal Aunt   . Diabetes Maternal Uncle   . Prostate cancer Father   . Hypertension Brother     Social History:  reports that she quit smoking about 36 years ago. Her smoking use included cigarettes. She quit after 1.00 year of use. She has never used smokeless tobacco. She reports current alcohol use. She reports that she does not use drugs.  Allergies:  Allergies  Allergen Reactions  . Tramadol Itching  . Codeine     Does not like the side effects-post taking    Medications Prior to Admission  Medication Sig Dispense Refill Last Dose  . albuterol (VENTOLIN HFA) 108 (90 Base) MCG/ACT inhaler Inhale 2 puffs into the lungs 4 (four) times daily as needed for wheezing or shortness of breath.    Past Month at Unknown time  . Ascorbic Acid (VITAMIN C PO) Take 1 tablet  by mouth daily.    Past Week at Unknown time  . atorvastatin (LIPITOR) 80 MG tablet Take 80 mg by mouth daily.    12/19/2019 at Unknown time  . Ferrous Sulfate (SLOW FE) 142 (45 Fe) MG TBCR Take 45 mg by mouth daily.    12/19/2019 at Unknown time  . losartan-hydrochlorothiazide (HYZAAR) 100-25 MG tablet Take 1 tablet by mouth daily.   12/19/2019 at Unknown time  . metFORMIN (GLUCOPHAGE) 500 MG tablet Take 500 mg by mouth daily.   12/19/2019 at Unknown time  . Multiple Vitamin (MULTIVITAMIN WITH MINERALS) TABS tablet Take 1 tablet by mouth daily.   Past Week at Unknown time  . omeprazole (PRILOSEC) 40 MG capsule TAKE 1 CAPSULE BY MOUTH TWICE A DAY (Patient taking  differently: Take 40 mg by mouth daily. ) 180 capsule 2 12/20/2019 at 0500  . VITAMIN D PO Take 2,000 Units by mouth daily.    Past Week at Unknown time  . methocarbamol (ROBAXIN) 500 MG tablet Take 500 mg by mouth every 6 (six) hours as needed for muscle spasms.    More than a month at Unknown time    Review of Systems  All other systems reviewed and are negative.   Blood pressure 137/72, pulse 63, temperature 98.2 F (36.8 C), temperature source Oral, resp. rate 20, height 5\' 6"  (1.676 m), weight 99.4 kg, SpO2 98 %. Physical Exam  Constitutional: She is oriented to person, place, and time. She appears well-developed and well-nourished.  Cardiovascular: Normal rate and regular rhythm.  Respiratory: Effort normal and breath sounds normal.  Neurological: She is alert and oriented to person, place, and time.  Skin: Skin is warm and dry.  Psychiatric: She has a normal mood and affect.    Results for orders placed or performed during the hospital encounter of 12/20/19 (from the past 24 hour(s))  I-STAT, chem 8     Status: Abnormal   Collection Time: 12/20/19  6:46 AM  Result Value Ref Range   Sodium 142 135 - 145 mmol/L   Potassium 4.1 3.5 - 5.1 mmol/L   Chloride 103 98 - 111 mmol/L   BUN 18 8 - 23 mg/dL   Creatinine, Ser 1.50 (H) 0.44 - 1.00 mg/dL   Glucose, Bld 99 70 - 99 mg/dL   Calcium, Ion 1.24 1.15 - 1.40 mmol/L   TCO2 29 22 - 32 mmol/L   Hemoglobin 11.9 (L) 12.0 - 15.0 g/dL   HCT 35.0 (L) 36.0 - 46.0 %    No results found.  Assessment/Plan: 69 yo G3P2 with PMP bleeding, uterine fibroids, probable endometrial polyp with thickened endometrium here for hysteroscopy, polyp resection, D&C.  Questions answered.  Pt ready to proceed.  Megan Salon 12/20/2019, 7:07 AM

## 2019-12-20 NOTE — Discharge Instructions (Addendum)
Post-surgical Instructions, Outpatient Surgery  You may expect to feel dizzy, weak, and drowsy for as long as 24 hours after receiving the medicine that made you sleep (anesthetic). For the first 24 hours after your surgery:    Do not drive a car, ride a bicycle, participate in physical activities, or take public transportation until you are done taking narcotic pain medicines or as directed by Dr. Sabra Heck.   Do not drink alcohol or take tranquilizers.   Do not take medicine that has not been prescribed by your physicians.   Do not sign important papers or make important decisions while on narcotic pain medicines.   Have a responsible person with you.   PAIN MANAGEMENT  Motrin 800mg .  (This is the same as 4-200mg  over the counter tablets of Motrin or ibuprofen.)  You may take this every eight hours or as needed for cramping.  OR you may take 400mg  (2-200mg  over the counter tablets) with 500mg  Tylenol every 4 hours as needed.    Vicodin 5/325mg .  For more severe pain, take one or two tablets every four to six hours as needed for pain control.  (Remember that narcotic pain medications increase your risk of constipation.  If this becomes a problem, you may take an over the counter stool softener like Colace 100mg  up to four times a day.)  If you take this, do not take the muscle relaxer that you also have as a medication.  These both can be sedating.  Do not drink any alcohol if taking the Vicodin.  DO'S AND DON'T'S  Do not take a tub bath for one week.  You may shower on the first day after your surgery  Do not do any heavy lifting for one to two weeks.  This increases the chance of bleeding.  Do move around as you feel able.  Stairs are fine.  You may begin to exercise again as you feel able.  Do not lift any weights for two weeks.  Do not put anything in the vagina for two weeks--no tampons, intercourse, or douching.    REGULAR MEDIATIONS/VITAMINS:  You may restart all of your regular  medications as prescribed.  You may restart all of your vitamins as you normally take them.    PLEASE CALL OR SEEK MEDICAL CARE IF:  You have persistent nausea and vomiting.   You have trouble eating or drinking.   You have an oral temperature above 100.5.   You have constipation that is not helped by adjusting diet or increasing fluid intake. Pain medicines are a common cause of constipation.   You have heavy vaginal bleeding  DISCHARGE INSTRUCTIONS: D&C / D&E The following instructions have been prepared to help you care for yourself upon your return home.   Personal hygiene: Marland Kitchen Use sanitary pads for vaginal drainage, not tampons. . Shower the day after your procedure. . NO tub baths, pools or Jacuzzis for 2-3 weeks. . Wipe front to back after using the bathroom.  Activity and limitations: . Do NOT drive or operate any equipment for 24 hours. The effects of anesthesia are still present and drowsiness may result. . Do NOT rest in bed all day. . Walking is encouraged. . Walk up and down stairs slowly. . You may resume your normal activity in one to two days or as indicated by your physician.  Sexual activity: NO intercourse for at least 2 weeks after the procedure, or as indicated by your physician.  Diet: Eat a light meal  as desired this evening. You may resume your usual diet tomorrow.  Return to work: You may resume your work activities in one to two days or as indicated by your doctor.  What to expect after your surgery: Expect to have vaginal bleeding/discharge for 2-3 days and spotting for up to 10 days. It is not unusual to have soreness for up to 1-2 weeks. You may have a slight burning sensation when you urinate for the first day. Mild cramps may continue for a couple of days. You may have a regular period in 2-6 weeks.  Call your doctor for any of the following: . Excessive vaginal bleeding, saturating and changing one pad every hour. . Inability to urinate 6 hours  after discharge from hospital. . Pain not relieved by pain medication. . Fever of 100.4 F or greater. . Unusual vaginal discharge or odor.   Post Anesthesia Home Care Instructions  Activity: Get plenty of rest for the remainder of the day. A responsible individual must stay with you for 24 hours following the procedure.  For the next 24 hours, DO NOT: -Drive a car -Paediatric nurse -Drink alcoholic beverages -Take any medication unless instructed by your physician -Make any legal decisions or sign important papers.  Meals: Start with liquid foods such as gelatin or soup. Progress to regular foods as tolerated. Avoid greasy, spicy, heavy foods. If nausea and/or vomiting occur, drink only clear liquids until the nausea and/or vomiting subsides. Call your physician if vomiting continues.  Special Instructions/Symptoms: Your throat may feel dry or sore from the anesthesia or the breathing tube placed in your throat during surgery. If this causes discomfort, gargle with warm salt water. The discomfort should disappear within 24 hours.  No ibuprofen, Motrin, Advil, Aleve, or naproxen until after 4:00 pm today if needed.

## 2019-12-21 LAB — SURGICAL PATHOLOGY

## 2019-12-23 ENCOUNTER — Ambulatory Visit: Payer: Medicare HMO | Admitting: Obstetrics & Gynecology

## 2019-12-23 DIAGNOSIS — E7800 Pure hypercholesterolemia, unspecified: Secondary | ICD-10-CM | POA: Diagnosis not present

## 2019-12-23 DIAGNOSIS — I129 Hypertensive chronic kidney disease with stage 1 through stage 4 chronic kidney disease, or unspecified chronic kidney disease: Secondary | ICD-10-CM | POA: Diagnosis not present

## 2019-12-23 DIAGNOSIS — E669 Obesity, unspecified: Secondary | ICD-10-CM | POA: Diagnosis not present

## 2019-12-23 DIAGNOSIS — E1129 Type 2 diabetes mellitus with other diabetic kidney complication: Secondary | ICD-10-CM | POA: Diagnosis not present

## 2019-12-23 DIAGNOSIS — N1831 Chronic kidney disease, stage 3a: Secondary | ICD-10-CM | POA: Diagnosis not present

## 2019-12-23 DIAGNOSIS — D631 Anemia in chronic kidney disease: Secondary | ICD-10-CM | POA: Diagnosis not present

## 2019-12-23 DIAGNOSIS — D692 Other nonthrombocytopenic purpura: Secondary | ICD-10-CM | POA: Diagnosis not present

## 2019-12-23 DIAGNOSIS — R809 Proteinuria, unspecified: Secondary | ICD-10-CM | POA: Diagnosis not present

## 2019-12-23 DIAGNOSIS — N939 Abnormal uterine and vaginal bleeding, unspecified: Secondary | ICD-10-CM | POA: Diagnosis not present

## 2019-12-24 ENCOUNTER — Telehealth: Payer: Self-pay

## 2019-12-24 NOTE — Telephone Encounter (Signed)
Some intermittent bleeding, spotting is normal after a hysteroscopy.  Thanks.  Ok to close encounter.

## 2019-12-24 NOTE — Telephone Encounter (Signed)
Spoke with pt. Pt called to give update on bleeding. Pt states bleeding is very light and small. No concerns at this. Pt given heavy bleeding precautions. Pt agreeable.   Routing to Dr Sabra Heck. Encounter closed.

## 2019-12-24 NOTE — Telephone Encounter (Signed)
Post op day 4 days on 12/20/19 procedure:   DILATATION & CURETTAGE/HYSTEROSCOPY WITH MYOSURE RESECTION OF POLYP (N/A Vagina )   Spoke with pt. Pt states just getting off phone with Dr Sabra Heck. Pt states went to bathroom after phone call and noticed some bleeding when she wiped. Pt states only small amount, denies heavy bleeding, clots, cramping or abd pain.   Pt advised to place pad and monitor at this time. Pt advised irregular bleeding can occur after procedure. Pt advised to call back to office with update this afternoon on bleeding. Pt states has pills left over of Provera at home. Pt advised not to take any Provera at this time. Pt agreeable.  Advised will update and review with Dr Sabra Heck and return call to pt with any additional recommendations. Pt agreeable.   Routing to Dr Sabra Heck

## 2019-12-24 NOTE — Telephone Encounter (Signed)
Patient states she is returning call to let Amy Reyes know she is having very little bleeding.

## 2020-01-04 ENCOUNTER — Other Ambulatory Visit: Payer: Self-pay

## 2020-01-04 ENCOUNTER — Ambulatory Visit
Admission: RE | Admit: 2020-01-04 | Discharge: 2020-01-04 | Disposition: A | Discharge status: Home / Self Care | Payer: Medicare HMO | Source: Ambulatory Visit | Attending: Orthopedic Surgery | Admitting: Orthopedic Surgery

## 2020-01-04 DIAGNOSIS — M25511 Pain in right shoulder: Secondary | ICD-10-CM

## 2020-01-04 NOTE — Progress Notes (Signed)
GYNECOLOGY  VISIT  CC:   Post op, recheck  HPI: 69 y.o. [id number] Widowed Black or [place] American female here for 2 week post op.  Underwent hysteroscopy with resection of polyp, D&C.  She and I discussed her pathology.  Pictures reviewed.  Denies pain.  Never needed anything for pain.  Has a little spotting for two or three days.   GYNECOLOGIC HISTORY: No LMP recorded. Patient is postmenopausal. Contraception: post menopausal Menopausal hormone therapy: none  Patient Active Problem List   Diagnosis Date Noted  . Endometrial mass 11/12/2019  . Fibroids, intramural 11/12/2019  . Hypertension   . Hypercholesteremia   . History of kidney stones   . Diabetes mellitus without complication ([place])   . Colon polyps   . OA (osteoarthritis) of knee 10/14/2016    Past Medical History:  Diagnosis Date  . Allergy   . Anemia    iron defiencey  . Arthritis    oa  . Bronchitis hx of  . Chronic kidney disease    ckd stage 2- 3 due to nsiad use no nephrologist  . Colon polyps   . Complication of anesthesia    slow to wake up one time, none recent  . Diabetes mellitus without complication ([place])    borderline type 2 on metformin  . GERD (gastroesophageal reflux disease)   . History of kidney stones   . Hypercholesteremia   . Hypertension   . Peptic ulcer   . PMB (postmenopausal bleeding)    last time middle of april 2021    Past Surgical History:  Procedure Laterality Date  . ABDOMINOPLASTY  30 yrs ago   tummy tuck  . BREAST SURGERY     reduction  . CHOLECYSTECTOMY  2000   laparoscopic  . COLONOSCOPY    . DILATATION & CURETTAGE/HYSTEROSCOPY WITH MYOSURE N/A 12/20/2019   Procedure: DILATATION & CURETTAGE/HYSTEROSCOPY WITH MYOSURE RESECTION OF POLYP;  Surgeon: Megan Salon, MD;  Location: [facility];  Service: Gynecology;  Laterality: N/A;  resection of polyp,  to follow first case  . INJECTION KNEE Left 10/14/2016   Procedure: CORTISONE LEFT KNEE INJECTION;   Surgeon: Gaynelle Arabian, MD;  Location: [facility];  Service: Orthopedics;  Laterality: Left;  . LAPAROSCOPIC GASTRIC BANDING  2010 or 2011  . left knee injection  6 weeks ago  . ROTATOR CUFF REPAIR Left 05/2019  . TOTAL KNEE ARTHROPLASTY Right 10/14/2016   Procedure: RIGHT TOTAL KNEE ARTHROPLASTY;  Surgeon: Gaynelle Arabian, MD;  Location: [facility];  Service: Orthopedics;  Laterality: Right;  Adductor Block    MEDS:   Current Outpatient Medications on File Prior to Visit  Medication Sig Dispense Refill  . albuterol (VENTOLIN HFA) 108 (90 Base) MCG/ACT inhaler Inhale 2 puffs into the lungs 4 (four) times daily as needed for wheezing or shortness of breath.     . Ascorbic Acid (VITAMIN C PO) Take 1 tablet by mouth daily.     Marland Kitchen atorvastatin (LIPITOR) 80 MG tablet Take 80 mg by mouth daily.     . Ferrous Sulfate (SLOW FE) 142 (45 Fe) MG TBCR Take 45 mg by mouth daily.     Marland Kitchen losartan-hydrochlorothiazide (HYZAAR) 100-25 MG tablet Take 1 tablet by mouth daily.    . metFORMIN (GLUCOPHAGE) 500 MG tablet Take 500 mg by mouth daily.    . methocarbamol (ROBAXIN) 500 MG tablet Take 500 mg by mouth every 6 (six) hours as needed for muscle spasms.     . Multiple Vitamin (  MULTIVITAMIN WITH MINERALS) TABS tablet Take 1 tablet by mouth daily.    Marland Kitchen omeprazole (PRILOSEC) 40 MG capsule TAKE 1 CAPSULE BY MOUTH TWICE A DAY (Patient taking differently: Take 40 mg by mouth daily. ) 180 capsule 2  . VITAMIN D PO Take 2,000 Units by mouth daily.      No current facility-administered medications on file prior to visit.    ALLERGIES: Tramadol and Codeine  Family History  Problem Relation Age of Onset  . Hypertension Mother   . Thyroid disease Mother   . Kidney disease Sister   . Hypertension Sister   . Diabetes Maternal Grandfather   . Diabetes Maternal Aunt   . Diabetes Maternal Uncle   . Prostate cancer Father   . Hypertension Brother     SH:  Widowed, non smoker  Review of Systems  Constitutional: Negative.    HENT: Negative.   Eyes: Negative.   Respiratory: Negative.   Cardiovascular: Negative.   Gastrointestinal: Negative.   Endocrine: Negative.   Genitourinary: Negative.   Musculoskeletal: Negative.   Skin: Negative.   Allergic/Immunologic: Negative.   Neurological: Negative.   Hematological: Negative.   Psychiatric/Behavioral: Negative.     PHYSICAL EXAMINATION:    Temp (!) 97 F (36.1 C) (Skin)   Wt 220 lb (99.8 kg)   BMI 35.51 kg/m     General appearance: alert, cooperative and appears stated age Lymph:  no inguinal LAD noted  Pelvic: External genitalia:  no lesions              Urethra:  normal appearing urethra with no masses, tenderness or lesions              Bartholins and Skenes: normal                 Vagina: normal appearing vagina with normal color and discharge, no lesions              Cervix: no lesions              Bimanual Exam:  Uterus:  Enlarged c/w fibroid uterus, non tender, mobile              Adnexa: no mass, fullness, tenderness  Chaperone, Achilles Dunk, CMA, was present for exam.  Assessment: S/p hysteroscopy polyp resection, D&C  Plan: Return for follow up with any new issues/concerns

## 2020-01-06 ENCOUNTER — Encounter: Payer: Self-pay | Admitting: Obstetrics & Gynecology

## 2020-01-06 ENCOUNTER — Ambulatory Visit (INDEPENDENT_AMBULATORY_CARE_PROVIDER_SITE_OTHER): Payer: Medicare HMO | Admitting: Obstetrics & Gynecology

## 2020-01-06 ENCOUNTER — Other Ambulatory Visit: Payer: Self-pay

## 2020-01-06 VITALS — BP 120/76 | HR 68 | Temp 97.0°F | Resp 16 | Wt 220.0 lb

## 2020-01-06 DIAGNOSIS — N840 Polyp of corpus uteri: Secondary | ICD-10-CM

## 2020-01-13 ENCOUNTER — Encounter: Payer: Self-pay | Admitting: Gastroenterology

## 2020-01-26 ENCOUNTER — Encounter: Payer: Medicare HMO | Admitting: Gastroenterology

## 2020-02-22 DIAGNOSIS — M25511 Pain in right shoulder: Secondary | ICD-10-CM | POA: Diagnosis not present

## 2020-02-22 DIAGNOSIS — M75121 Complete rotator cuff tear or rupture of right shoulder, not specified as traumatic: Secondary | ICD-10-CM | POA: Diagnosis not present

## 2020-02-22 DIAGNOSIS — M25512 Pain in left shoulder: Secondary | ICD-10-CM | POA: Diagnosis not present

## 2020-02-24 DIAGNOSIS — M1712 Unilateral primary osteoarthritis, left knee: Secondary | ICD-10-CM | POA: Diagnosis not present

## 2020-03-09 ENCOUNTER — Ambulatory Visit (AMBULATORY_SURGERY_CENTER): Payer: Self-pay

## 2020-03-09 ENCOUNTER — Other Ambulatory Visit: Payer: Self-pay

## 2020-03-09 VITALS — Ht 66.75 in | Wt 222.2 lb

## 2020-03-09 DIAGNOSIS — Z86010 Personal history of colonic polyps: Secondary | ICD-10-CM

## 2020-03-09 NOTE — Progress Notes (Signed)
No egg or soy allergy known to patient  No issues with past sedation with any surgeries or procedures No intubation problems in the past  No FH of Malignant Hyperthermia No diet pills per patient No home 02 use per patient  No blood thinners per patient  Pt denies issues with constipation  No A fib or A flutter  EMMI video to pt or via MyChart  COVID 19 guidelines implemented in [facility] today with Pt and RN  COVID vaccines completed on 09/2019 per pt;  Due to the COVID-19 pandemic we are asking patients to follow these guidelines. Please only bring one care partner. Please be aware that your care partner may wait in the car in the parking lot or if they feel like they will be too hot to wait in the car, they may wait in the lobby on the 4th floor. All care partners are required to wear a mask the entire time (we do not have any that we can provide them), they need to practice social distancing, and we will do a Covid check for all patient's and care partners when you arrive. Also we will check their temperature and your temperature. If the care partner waits in their car they need to stay in the parking lot the entire time and we will call them on their cell phone when the patient is ready for discharge so they can bring the car to the front of the building. Also all patient's will need to wear a mask into building.  

## 2020-03-15 ENCOUNTER — Telehealth: Payer: Self-pay

## 2020-03-15 NOTE — Telephone Encounter (Signed)
H/o PMB H/o Endometrial polyp- removed 12/2019  Spoke with pt. Pt reports seeing vaginal  blood on tissue when wiping today x 1. Pt denies any UTI sx, abd cramps/pain, fever, chills. Pt advised to place panty liner and monitor. Pt agreeable. Pt advised to seek ER if has heavy bleeding or soaking pads or clots over night. Pt agreeable.  Pt advised to have OV for further evaluation. Pt agreeable. Pt scheduled as work-in appt with Dr Sabra Heck on 9/2 at 1115. Pt verbalized understanding to date and time of appt.  Encounter closed.

## 2020-03-15 NOTE — Telephone Encounter (Signed)
Patient is calling in regards to blood in urine.

## 2020-03-16 ENCOUNTER — Ambulatory Visit (INDEPENDENT_AMBULATORY_CARE_PROVIDER_SITE_OTHER): Payer: Medicare HMO | Admitting: Obstetrics & Gynecology

## 2020-03-16 ENCOUNTER — Other Ambulatory Visit: Payer: Self-pay

## 2020-03-16 ENCOUNTER — Encounter: Payer: Self-pay | Admitting: Obstetrics & Gynecology

## 2020-03-16 VITALS — BP 162/70 | HR 68 | Resp 16

## 2020-03-16 DIAGNOSIS — N840 Polyp of corpus uteri: Secondary | ICD-10-CM

## 2020-03-16 DIAGNOSIS — N950 Postmenopausal bleeding: Secondary | ICD-10-CM | POA: Diagnosis not present

## 2020-03-16 NOTE — Progress Notes (Signed)
GYNECOLOGY  VISIT  CC:   Vaginal bleeding  HPI: 69 y.o. G3P0012 Widowed Black or [place] American female here for vaginal bleeding.  Pt had a little cramping yesterday and then a little bright red bleeding followed by some dark brown bleeding.  This has tapered off.  Pt has hx of uterine fibroids and had hx of hysteroscopy polyp resection, D&C in June.  Pathology reviewed with pt again today as well as pictures.  Pathology was negative.    GYNECOLOGIC HISTORY: No LMP recorded. Patient is postmenopausal. Contraception: post menopausal Menopausal hormone therapy: none  Patient Active Problem List   Diagnosis Date Noted  . Fibroids, intramural 11/12/2019  . Hypertension   . Hypercholesteremia   . History of kidney stones   . Diabetes mellitus without complication ([place])   . Colon polyps   . OA (osteoarthritis) of knee 10/14/2016    Past Medical History:  Diagnosis Date  . Allergy    seasonal allergies  . Anemia    iron defiencey  . Arthritis    oa  . Bronchitis hx of  . Chronic kidney disease    ckd stage 2- 3 due to NSAIDS use no nephrologist  . Colon polyps   . Complication of anesthesia    slow to wake up one time, none recent  . Diabetes mellitus without complication ([place])    borderline type 2 on metformin  . GERD (gastroesophageal reflux disease)    on meds  . History of kidney stones   . Hypercholesteremia    on meds  . Hypertension    on meds  . Peptic ulcer   . PMB (postmenopausal bleeding)    last time middle of april 2021    Past Surgical History:  Procedure Laterality Date  . ABDOMINOPLASTY  30 yrs ago   tummy tuck  . BREAST SURGERY     reduction  . CHOLECYSTECTOMY  2000   laparoscopic  . COLONOSCOPY  2019   TA  . DILATATION & CURETTAGE/HYSTEROSCOPY WITH MYOSURE N/A 12/20/2019   Procedure: DILATATION & CURETTAGE/HYSTEROSCOPY WITH MYOSURE RESECTION OF POLYP;  Surgeon: Megan Salon, MD;  Location: [facility];  Service: Gynecology;   Laterality: N/A;  resection of polyp,  to follow first case  . INJECTION KNEE Left 10/14/2016   Procedure: CORTISONE LEFT KNEE INJECTION;  Surgeon: Gaynelle Arabian, MD;  Location: [facility];  Service: Orthopedics;  Laterality: Left;  . LAPAROSCOPIC GASTRIC BANDING  2010 or 2011  . left knee injection  6 weeks ago  . POLYPECTOMY     TA  . ROTATOR CUFF REPAIR Left 05/2019  . TOTAL KNEE ARTHROPLASTY Right 10/14/2016   Procedure: RIGHT TOTAL KNEE ARTHROPLASTY;  Surgeon: Gaynelle Arabian, MD;  Location: [facility];  Service: Orthopedics;  Laterality: Right;  Adductor Block    MEDS:   Current Outpatient Medications on File Prior to Visit  Medication Sig Dispense Refill  . albuterol (VENTOLIN HFA) 108 (90 Base) MCG/ACT inhaler Inhale 2 puffs into the lungs 4 (four) times daily as needed for wheezing or shortness of breath.     . Ascorbic Acid (VITAMIN C PO) Take 1 tablet by mouth daily.     Marland Kitchen atorvastatin (LIPITOR) 80 MG tablet Take 80 mg by mouth daily.     . Ferrous Sulfate (SLOW FE) 142 (45 Fe) MG TBCR Take 45 mg by mouth daily.     Marland Kitchen losartan-hydrochlorothiazide (HYZAAR) 100-25 MG tablet Take 1 tablet by mouth daily.    . metFORMIN (  GLUCOPHAGE) 500 MG tablet Take 500 mg by mouth daily.    . Multiple Vitamin (MULTIVITAMIN WITH MINERALS) TABS tablet Take 1 tablet by mouth daily.    Marland Kitchen omeprazole (PRILOSEC) 40 MG capsule TAKE 1 CAPSULE BY MOUTH TWICE A DAY (Patient taking differently: Take 40 mg by mouth daily. ) 180 capsule 2  . VITAMIN D PO Take 2,000 Units by mouth daily.      No current facility-administered medications on file prior to visit.    ALLERGIES: Tramadol and Codeine  Family History  Problem Relation Age of Onset  . Hypertension Mother   . Thyroid disease Mother   . Kidney disease Sister   . Hypertension Sister   . Diabetes Maternal Grandfather   . Diabetes Maternal Aunt   . Diabetes Maternal Uncle   . Prostate cancer Father 90  . Colon cancer Father 95  . Colon polyps Father 2  .  Hypertension Brother   . Uterine cancer Maternal Aunt   . Rectal cancer Neg Hx   . Stomach cancer Neg Hx     SH:  Widowed, non smoker  Review of Systems  Constitutional: Negative.   HENT: Negative.   Eyes: Negative.   Respiratory: Negative.   Cardiovascular: Negative.   Gastrointestinal: Negative.   Endocrine: Negative.   Genitourinary:       Vaginal bleeding  Musculoskeletal: Negative.   Skin: Negative.   Allergic/Immunologic: Negative.   Neurological: Negative.   Hematological: Negative.   Psychiatric/Behavioral: Negative.     PHYSICAL EXAMINATION:    BP (!) 162/70   Pulse 68   Resp 16     General appearance: alert, cooperative and appears stated age Abdomen: soft, non-tender; bowel sounds normal; no masses,  no organomegaly Lymph:  no inguinal LAD noted  Pelvic: External genitalia:  no lesions              Urethra:  normal appearing urethra with no masses, tenderness or lesions              Bartholins and Skenes: normal                 Vagina: normal appearing vagina with normal color and discharge, no lesions              Cervix: no lesions, dark old appearing blood/discharge with exam              Bimanual Exam:  Uterus:  enlarged, 10 weeks size, globular              Adnexa: no mass, fullness, tenderness  Chaperone, Karmen Bongo, CMA, was present for exam.  Assessment: PMP bleeding, in pt with hysteroscopy and polyp resection 12/2019 Uterine fibroids, stable exam Normal pap 10/2019  Plan: Reassured pt about bleeding at this point.  As she is < 3 months from surgery with negative pap/pathology, do not feel additional evaluation is needed at this time.  However, if she does have bleeding again, would proceed with ultrasound.  Pt is comfortable with plan.

## 2020-03-19 DIAGNOSIS — N840 Polyp of corpus uteri: Secondary | ICD-10-CM | POA: Insufficient documentation

## 2020-03-23 ENCOUNTER — Other Ambulatory Visit: Payer: Self-pay

## 2020-03-23 ENCOUNTER — Ambulatory Visit (AMBULATORY_SURGERY_CENTER): Payer: Medicare HMO | Admitting: Gastroenterology

## 2020-03-23 ENCOUNTER — Encounter: Payer: Self-pay | Admitting: Gastroenterology

## 2020-03-23 ENCOUNTER — Encounter: Payer: Medicare HMO | Admitting: Gastroenterology

## 2020-03-23 VITALS — BP 128/56 | HR 72 | Temp 97.8°F | Resp 12 | Ht 66.0 in | Wt 222.0 lb

## 2020-03-23 DIAGNOSIS — D127 Benign neoplasm of rectosigmoid junction: Secondary | ICD-10-CM | POA: Diagnosis not present

## 2020-03-23 DIAGNOSIS — D123 Benign neoplasm of transverse colon: Secondary | ICD-10-CM | POA: Diagnosis not present

## 2020-03-23 DIAGNOSIS — D122 Benign neoplasm of ascending colon: Secondary | ICD-10-CM | POA: Diagnosis not present

## 2020-03-23 DIAGNOSIS — D120 Benign neoplasm of cecum: Secondary | ICD-10-CM

## 2020-03-23 DIAGNOSIS — D126 Benign neoplasm of colon, unspecified: Secondary | ICD-10-CM | POA: Diagnosis not present

## 2020-03-23 DIAGNOSIS — Z86010 Personal history of colonic polyps: Secondary | ICD-10-CM

## 2020-03-23 MED ORDER — SODIUM CHLORIDE 0.9 % IV SOLN: 500.0000 mL | Freq: Once | INTRAVENOUS | Status: DC

## 2020-03-23 MED ORDER — SODIUM CHLORIDE 0.9 % IV SOLN
4.0000 mg | Freq: Once | INTRAVENOUS | Status: AC
  Administered 2020-03-23: 4 mg via INTRAVENOUS

## 2020-03-23 NOTE — Patient Instructions (Addendum)
YOU HAD AN ENDOSCOPIC PROCEDURE TODAY AT [facility]:   Refer to the procedure report that was given to you for any specific questions about what was found during the examination.  If the procedure report does not answer your questions, please call your gastroenterologist to clarify.  If you requested that your care partner not be given the details of your procedure findings, then the procedure report has been included in a sealed envelope for you to review at your convenience later.  YOU SHOULD EXPECT: Some feelings of bloating in the abdomen. Passage of more gas than usual.  Walking can help get rid of the air that was put into your GI tract during the procedure and reduce the bloating. If you had a lower endoscopy (such as a colonoscopy or flexible sigmoidoscopy) you may notice spotting of blood in your stool or on the toilet paper. If you underwent a bowel prep for your procedure, you may not have a normal bowel movement for a few days.  Please Note:  You might notice some irritation and congestion in your nose or some drainage.  This is from the oxygen used during your procedure.  There is no need for concern and it should clear up in a day or so.  SYMPTOMS TO REPORT IMMEDIATELY:   Following lower endoscopy (colonoscopy or flexible sigmoidoscopy):  Excessive amounts of blood in the stool  Significant tenderness or worsening of abdominal pains  Swelling of the abdomen that is new, acute  Fever of 100F or higher   For urgent or emergent issues, a gastroenterologist can be reached at any hour by calling [phone]. Do not use MyChart messaging for urgent concerns.    DIET:  We do recommend a small meal at first, but then you may proceed to your regular diet.  Drink plenty of fluids but you should avoid alcoholic beverages for 24 hours. Follow a High Fiber Diet.  MEDICATIONS: Continue present medications. Add a daily stool bulking agent such as Psyllium (an example would  be Metamucil). Drink at least 64 ounces of water daily as well.  Please see handouts given to you by your recovery nurse.  FOLLOW UP: Dr. Tarri Glenn will refer you to a surgeon for consult for hemorrhoid removal. Her office nurse will call to schedule this appointment.  ACTIVITY:  You should plan to take it easy for the rest of today and you should NOT DRIVE or use heavy machinery until tomorrow (because of the sedation medicines used during the test).    FOLLOW UP: Our staff will call the number listed on your records 48-72 hours following your procedure to check on you and address any questions or concerns that you may have regarding the information given to you following your procedure. If we do not reach you, we will leave a message.  We will attempt to reach you two times.  During this call, we will ask if you have developed any symptoms of COVID 19. If you develop any symptoms (ie: fever, flu-like symptoms, shortness of breath, cough etc.) before then, please call [phone].  If you test positive for Covid 19 in the 2 weeks post procedure, please call and report this information to [place].    If any biopsies were taken you will be contacted by phone or by letter within the next 1-3 weeks.  Please call us at [phone] if you have not heard about the biopsies in 3 weeks.   Thank you for allowing [place] to  provide for your healthcare needs today.   SIGNATURES/CONFIDENTIALITY: You and/or your care partner have signed paperwork which will be entered into your electronic medical record.  These signatures attest to the fact that that the information above on your After Visit Summary has been reviewed and is understood.  Full responsibility of the confidentiality of this discharge information lies with you and/or your care-partner.

## 2020-03-23 NOTE — Progress Notes (Signed)
Pt's states no medical or surgical changes since previsit or office visit.  CW - vitals 

## 2020-03-23 NOTE — Progress Notes (Signed)
Called to room to assist during endoscopic procedure.  Patient ID and intended procedure confirmed with present staff. Received instructions for my participation in the procedure from the performing physician.  

## 2020-03-23 NOTE — Op Note (Addendum)
[facility] Patient Name: Amy Reyes Procedure Date: 03/23/2020 9:10 AM MRN: [id number] Endoscopist: Thornton Park MD, MD Age: 69 Referring MD:  Date of Birth: 09-03-1950 Gender: Female Account #: [id number] Procedure:                Colonoscopy Indications:              High risk colon cancer surveillance: Personal                            history of adenoma (10 mm or greater in size)                           - 07/13/18: 18 mm cecal tubular adenoma was found                            deeply wedged behind the IC valve                           - and small tubular adenomas (35mm ascending, 22mm                            ascending, 17mm sigmoid)                           - surveillance recommended in 6 months given the                            piecemeal technique                           - 12/22/18: 10 mm residual polyp in the cecum                           - surveillance colonoscopy recommended in one year Medicines:                Monitored Anesthesia Care Procedure:                Pre-Anesthesia Assessment:                           - Prior to the procedure, a History and Physical                            was performed, and patient medications and                            allergies were reviewed. The patient's tolerance of                            previous anesthesia was also reviewed. The risks                            and benefits of the procedure and the sedation  options and risks were discussed with the patient.                            All questions were answered, and informed consent                            was obtained. Prior Anticoagulants: The patient has                            taken no previous anticoagulant or antiplatelet                            agents. ASA Grade Assessment: II - A patient with                            mild systemic disease. After reviewing the risks                            and  benefits, the patient was deemed in                            satisfactory condition to undergo the procedure.                           After obtaining informed consent, the colonoscope                            was passed under direct vision. Throughout the                            procedure, the patient's blood pressure, pulse, and                            oxygen saturations were monitored continuously. The                            Colonoscope was introduced through the anus and                            advanced to the 4 cm into the ileum. The                            colonoscopy was technically difficult and complex                            due to unusual anatomy. Successful completion of                            the procedure was aided by changing the patient's                            position and applying abdominal pressure. The  patient tolerated the procedure well. The quality                            of the bowel preparation was good. The ileocecal                            valve, appendiceal orifice, and rectum were                            photographed. Scope In: 9:19:54 AM Scope Out: 10:08:26 AM Scope Withdrawal Time: 0 hours 43 minutes 19 seconds  Total Procedure Duration: 0 hours 48 minutes 32 seconds  Findings:                 Hemorrhoids were found on perianal exam and                            retroflexed views of the rectum.                           Multiple small and large-mouthed diverticula were                            found in the sigmoid colon, descending colon and                            ascending colon.                           A 2 mm polyp was found in the rectum. The polyp was                            sessile. The polyp was removed with a cold snare.                            Resection and retrieval were complete. Estimated                            blood loss was minimal.                            A 2 mm polyp was found in the hepatic flexure. The                            polyp was sessile. The polyp was removed with a                            cold biopsy forceps. Resection and retrieval were                            complete. Estimated blood loss was minimal.                           A 2 mm polyp was found in the ascending colon. The  polyp was sessile. The polyp was removed with a                            cold snare. Resection and retrieval were complete.                            Estimated blood loss was minimal.                           A 64mm residual polyp was found in the cecum wedged                            behind the IC valve. The polyp was sessile. The                            polyp was extremely difficult to remove given the                            proximity of the IC valve. I attempted to remove it                            with a piecemeal technique using a cold snare. She                            did not tolerate enough insufflation to allow for a                            retroflexed resection. I used two different snares                            in an attempt to better resect the mucosa. The                            margins were then resected using cold forceps.                            Estimated blood loss was minimal.                           The colon is tortuous and redundant. There is                            significant looping and risk for polyps in loops                            that could not be seen. The exam was otherwise                            without abnormality on direct and retroflexion                            views. Complications:  No immediate complications. Estimated blood loss:                            Minimal. Estimated Blood Loss:     Estimated blood loss was minimal. Impression:               - Hemorrhoids found on perianal exam.                           -  Diverticulosis in the sigmoid colon, in the                            descending colon and in the ascending colon.                           - One 2 mm polyp in the rectum, removed with a cold                            snare. Resected and retrieved.                           - One 2 mm polyp at the hepatic flexure, removed                            with a cold biopsy forceps. Resected and retrieved.                           - One 2 mm polyp in the ascending colon, removed                            with a cold snare. Resected and retrieved.                           - One 15 mm polyp in the cecum, removed piecemeal                            using a cold snare. Resected and retrieved.                           - The examination was otherwise normal on direct                            and retroflexion views. Recommendation:           - Patient has a contact number available for                            emergencies. The signs and symptoms of potential                            delayed complications were discussed with the                            patient. Return to normal activities tomorrow.  Written discharge instructions were provided to the                            patient.                           - Follow a high fiber diet. Drink at least 64                            ounces of water daily. Add a daily stool bulking                            agent such as psyllium (an exampled would be                            Metamucil).                           - Continue present medications.                           - Await pathology results.                           - Repeat colonoscopy date to be determined after                            pending pathology results are reviewed for                            surveillance.                           - Emerging evidence supports eating a diet of                            fruits, vegetables, grains,  calcium, and yogurt                            while reducing red meat and alcohol may reduce the                            risk of colon cancer.                           - Thank you for allowing me to be involved in your                            colon cancer prevention. Thornton Park MD, MD 03/23/2020 10:20:03 AM This report has been signed electronically.

## 2020-03-23 NOTE — Progress Notes (Signed)
PT taken to PACU. Monitors in place. VSS. Report given to RN.   Pt reports nausea w/o vomiting and abdominal pain. MD notified, 4 mg zofran given and pt advised  to push air out as tolerated.

## 2020-03-27 ENCOUNTER — Telehealth: Payer: Self-pay

## 2020-03-27 NOTE — Telephone Encounter (Signed)
°  Follow up Call-  Call back number 03/23/2020 12/22/2018 07/13/2018  Post procedure Call Back phone  # [phone]  Permission to leave phone message Yes Yes Yes  Some recent data might be hidden     Patient questions:  Do you have a fever, pain , or abdominal swelling? No. Pain Score  0 *  Have you tolerated food without any problems? Yes.    Have you been able to return to your normal activities? Yes.    Do you have any questions about your discharge instructions: Diet   No. Medications  No. Follow up visit  No.  Do you have questions or concerns about your Care? No.  Actions: * If pain score is 4 or above: 1. No action needed, pain <4.Have you developed a fever since your procedure? no  2.   Have you had an respiratory symptoms (SOB or cough) since your procedure? no  3.   Have you tested positive for COVID 19 since your procedure no  4.   Have you had any family members/close contacts diagnosed with the COVID 19 since your procedure?  no   If yes to any of these questions please route to Joylene John, RN and Joella Prince, RN

## 2020-03-28 ENCOUNTER — Other Ambulatory Visit (HOSPITAL_COMMUNITY): Payer: Medicare HMO

## 2020-04-03 ENCOUNTER — Ambulatory Visit: Admit: 2020-04-03 | Payer: Medicare HMO | Admitting: Orthopedic Surgery

## 2020-04-03 DIAGNOSIS — E7800 Pure hypercholesterolemia, unspecified: Secondary | ICD-10-CM | POA: Diagnosis not present

## 2020-04-03 DIAGNOSIS — E1129 Type 2 diabetes mellitus with other diabetic kidney complication: Secondary | ICD-10-CM | POA: Diagnosis not present

## 2020-04-03 DIAGNOSIS — N1831 Chronic kidney disease, stage 3a: Secondary | ICD-10-CM | POA: Diagnosis not present

## 2020-04-03 DIAGNOSIS — I129 Hypertensive chronic kidney disease with stage 1 through stage 4 chronic kidney disease, or unspecified chronic kidney disease: Secondary | ICD-10-CM | POA: Diagnosis not present

## 2020-04-03 DIAGNOSIS — Z9884 Bariatric surgery status: Secondary | ICD-10-CM | POA: Diagnosis not present

## 2020-04-03 DIAGNOSIS — Z6836 Body mass index (BMI) 36.0-36.9, adult: Secondary | ICD-10-CM | POA: Diagnosis not present

## 2020-04-03 SURGERY — ARTHROPLASTY, KNEE, TOTAL
Anesthesia: Choice | Site: Knee | Laterality: Left

## 2020-04-04 NOTE — Progress Notes (Addendum)
COVID Vaccine Completed: Yes Date COVID Vaccine completed: 09/08/19, 10/06/19 COVID vaccine manufacturer:  Moderna     PCP - Dr. Domenick Gong Cardiologist - N/A  Chest x-ray - greater than 1 year EKG - 12/02/19 in epic Stress Test -  Greater than 2 years ECHO - N/A Cardiac Cath - N/A Pacemaker/ICD device last checked:N/A  Sleep Study - Yes CPAP - No sleep apnea  Fasting Blood Sugar - 90's Checks Blood Sugar ___1__ times a every 2 week  Blood Thinner Instructions: N/A Aspirin Instructions:N/A Last Dose:N/A  Anesthesia review: N/A  Patient denies shortness of breath, fever, cough and chest pain at PAT appointment   Patient verbalized understanding of instructions that were given to them at the PAT appointment. Patient was also instructed that they will need to review over the PAT instructions again at home before surgery.

## 2020-04-04 NOTE — Patient Instructions (Addendum)
DUE TO COVID-19 ONLY ONE VISITOR IS ALLOWED TO COME WITH YOU AND STAY IN THE WAITING ROOM ONLY DURING PRE OP AND PROCEDURE.   IF YOU WILL BE ADMITTED INTO THE HOSPITAL YOU ARE ALLOWED ONE SUPPORT PERSON DURING VISITATION HOURS ONLY (10AM -8PM)   . The support person may change daily. . The support person must pass our screening, gel in and out, and wear a mask at all times, including in the patient's room. . Patients must also wear a mask when staff or their support person are in the room.   COVID SWAB TESTING MUST BE COMPLETED ON:  Thursday, Oct. 7, 2021 at 9:00 AM   [address] [place], [place] [address]  (Must self quarantine after testing. Follow instructions on handout.)       Your procedure is scheduled on: Monday, Oct. 11, 2021   Report to [facility] Main  Entrance    Report to admitting at 9:05 AM   Call this number if you have problems the morning of surgery [phone]   Do not eat food :After Midnight.   May have liquids until  8:30 AM  day of surgery  CLEAR LIQUID DIET  Foods Allowed                                                                     Foods Excluded  Water, Black Coffee and tea, regular and decaf                             liquids that you cannot  Plain Jell-O in any flavor  (No red)                                           see through such as: Fruit ices (not with fruit pulp)                                     milk, soups, orange juice              Iced Popsicles (No red)                                    All solid food                                   Apple juices Sports drinks like Gatorade (No red) Lightly seasoned clear broth or consume(fat free) Sugar, honey syrup  Sample Menu Breakfast                                Lunch                                     Supper  Cranberry juice                    Beef broth                            Chicken broth Jell-O                                     Grape juice                            Apple juice Coffee or tea                        Jell-O                                      Popsicle                                                Coffee or tea                        Coffee or tea      Complete one G2 drink the morning of surgery at 8:30AM the day of surgery.    Oral Hygiene is also important to reduce your risk of infection.                                    Remember - BRUSH YOUR TEETH THE MORNING OF SURGERY WITH YOUR REGULAR TOOTHPASTE   Do NOT smoke after Midnight   Take these medicines the morning of surgery with A SIP OF WATER: Atorvastatin, Omeprazole   Bring Asthma Inhaler day of surgery  DO NOT TAKE ANY ORAL DIABETIC MEDICATIONS DAY OF YOUR SURGERY                               You may not have any metal on your body including hair pins, jewelry, and body piercings             Do not wear make-up, lotions, powders, perfumes/cologne, or deodorant             Do not wear nail polish.  Do not shave  48 hours prior to surgery.               Do not bring valuables to the hospital. [place].   Contacts, dentures or bridgework may not be worn into surgery.   Bring small overnight bag day of surgery.    Special Instructions: Bring a copy of your healthcare power of attorney and living will documents         the day of surgery if you haven't scanned them in before.              Please read over the following fact sheets you  were given: IF YOU HAVE QUESTIONS ABOUT YOUR PRE OP INSTRUCTIONS PLEASE CALL [phone]  How to Manage Your Diabetes Before and After Surgery  Why is it important to control my blood sugar before and after surgery? . Improving blood sugar levels before and after surgery helps healing and can limit problems. . A way of improving blood sugar control is eating a healthy diet by: o  Eating less sugar and carbohydrates o  Increasing activity/exercise o  Talking with your doctor about  reaching your blood sugar goals . High blood sugars (greater than 180 mg/dL) can raise your risk of infections and slow your recovery, so you will need to focus on controlling your diabetes during the weeks before surgery. . Make sure that the doctor who takes care of your diabetes knows about your planned surgery including the date and location.  How do I manage my blood sugar before surgery? . Check your blood sugar at least 4 times a day, starting 2 days before surgery, to make sure that the level is not too high or low. o Check your blood sugar the morning of your surgery when you wake up and every 2 hours until you get to the Short Stay unit. . If your blood sugar is less than 70 mg/dL, you will need to treat for low blood sugar: o Do not take insulin. o Treat a low blood sugar (less than 70 mg/dL) with  cup of clear juice (cranberry or apple), 4 glucose tablets, OR glucose gel. o Recheck blood sugar in 15 minutes after treatment (to make sure it is greater than 70 mg/dL). If your blood sugar is not greater than 70 mg/dL on recheck, call [phone] for further instructions. . Report your blood sugar to the short stay nurse when you get to Short Stay.  . If you are admitted to the hospital after surgery: o Your blood sugar will be checked by the staff and you will probably be given insulin after surgery (instead of oral diabetes medicines) to make sure you have good blood sugar levels. o The goal for blood sugar control after surgery is 80-180 mg/dL.   WHAT DO I DO ABOUT MY DIABETES MEDICATION?  Marland Kitchen Do not take oral diabetes medicines (pills) the morning of surgery.  T Reviewed and Endorsed by [facility] Patient Education Committee, August 2015   [facility] - Preparing for Surgery Before surgery, you can play an important role.  Because skin is not sterile, your skin needs to be as free of germs as possible.  You can reduce the number of germs on your skin by washing with CHG  (chlorahexidine gluconate) soap before surgery.  CHG is an antiseptic cleaner which kills germs and bonds with the skin to continue killing germs even after washing. Please DO NOT use if you have an allergy to CHG or antibacterial soaps.  If your skin becomes reddened/irritated stop using the CHG and inform your nurse when you arrive at Short Stay. Do not shave (including legs and underarms) for at least 48 hours prior to the first CHG shower.  You may shave your face/neck.  Please follow these instructions carefully:  1.  Shower with CHG Soap the night before surgery and the  morning of surgery.  2.  If you choose to wash your hair, wash your hair first as usual with your normal  shampoo.  3.  After you shampoo, rinse your hair and body thoroughly to remove the shampoo.  4.  Use CHG as you would any other liquid soap.  You can apply chg directly to the skin and wash.  Gently with a scrungie or clean washcloth.  5.  Apply the CHG Soap to your body ONLY FROM THE NECK DOWN.   Do   not use on face/ open                           Wound or open sores. Avoid contact with eyes, ears mouth and   genitals (private parts).                       Wash face,  Genitals (private parts) with your normal soap.             6.  Wash thoroughly, paying special attention to the area where your    surgery  will be performed.  7.  Thoroughly rinse your body with warm water from the neck down.  8.  DO NOT shower/wash with your normal soap after using and rinsing off the CHG Soap.                9.  Pat yourself dry with a clean towel.            10.  Wear clean pajamas.            11.  Place clean sheets on your bed the night of your first shower and do not  sleep with pets. Day of Surgery : Do not apply any lotions/deodorants the morning of surgery.  Please wear clean clothes to the hospital/surgery center.  FAILURE TO FOLLOW THESE INSTRUCTIONS MAY RESULT IN THE CANCELLATION OF YOUR  SURGERY  PATIENT SIGNATURE_________________________________  NURSE SIGNATURE__________________________________  ________________________________________________________________________   Adam Phenix  An incentive spirometer is a tool that can help keep your lungs clear and active. This tool measures how well you are filling your lungs with each breath. Taking long deep breaths may help reverse or decrease the chance of developing breathing (pulmonary) problems (especially infection) following:  A long period of time when you are unable to move or be active. BEFORE THE PROCEDURE   If the spirometer includes an indicator to show your best effort, your nurse or respiratory therapist will set it to a desired goal.  If possible, sit up straight or lean slightly forward. Try not to slouch.  Hold the incentive spirometer in an upright position. INSTRUCTIONS FOR USE  1. Sit on the edge of your bed if possible, or sit up as far as you can in bed or on a chair. 2. Hold the incentive spirometer in an upright position. 3. Breathe out normally. 4. Place the mouthpiece in your mouth and seal your lips tightly around it. 5. Breathe in slowly and as deeply as possible, raising the piston or the ball toward the top of the column. 6. Hold your breath for 3-5 seconds or for as long as possible. Allow the piston or ball to fall to the bottom of the column. 7. Remove the mouthpiece from your mouth and breathe out normally. 8. Rest for a few seconds and repeat Steps 1 through 7 at least 10 times every 1-2 hours when you are awake. Take your time and take a few normal breaths between deep breaths. 9. The spirometer may include an indicator to show your best effort. Use the indicator as a goal to work toward during each repetition.  10. After each set of 10 deep breaths, practice coughing to be sure your lungs are clear. If you have an incision (the cut made at the time of surgery), support your  incision when coughing by placing a pillow or rolled up towels firmly against it. Once you are able to get out of bed, walk around indoors and cough well. You may stop using the incentive spirometer when instructed by your caregiver.  RISKS AND COMPLICATIONS  Take your time so you do not get dizzy or light-headed.  If you are in pain, you may need to take or ask for pain medication before doing incentive spirometry. It is harder to take a deep breath if you are having pain. AFTER USE  Rest and breathe slowly and easily.  It can be helpful to keep track of a log of your progress. Your caregiver can provide you with a simple table to help with this. If you are using the spirometer at home, follow these instructions: [place] IF:   You are having difficultly using the spirometer.  You have trouble using the spirometer as often as instructed.  Your pain medication is not giving enough relief while using the spirometer.  You develop fever of 100.5 F (38.1 C) or higher. SEEK IMMEDIATE MEDICAL CARE IF:   You cough up bloody sputum that had not been present before.  You develop fever of 102 F (38.9 C) or greater.  You develop worsening pain at or near the incision site. MAKE SURE YOU:   Understand these instructions.  Will watch your condition.  Will get help right away if you are not doing well or get worse. Document Released: 11/11/2006 Document Revised: 09/23/2011 Document Reviewed: 01/12/2007 Exit[employer] Patient Information 2014 [employer], [place].   ________________________________________________________________________  WHAT IS A BLOOD TRANSFUSION? Blood Transfusion Information  A transfusion is the replacement of blood or some of its parts. Blood is made up of multiple cells which provide different functions.  Red blood cells carry oxygen and are used for blood loss replacement.  White blood cells fight against infection.  Platelets control bleeding.  Plasma  helps clot blood.  Other blood products are available for specialized needs, such as hemophilia or other clotting disorders. BEFORE THE TRANSFUSION  Who gives blood for transfusions?   Healthy volunteers who are fully evaluated to make sure their blood is safe. This is blood bank blood. Transfusion therapy is the safest it has ever been in the practice of medicine. Before blood is taken from a donor, a complete history is taken to make sure that person has no history of diseases nor engages in risky social behavior (examples are intravenous drug use or sexual activity with multiple partners). The donor's travel history is screened to minimize risk of transmitting infections, such as malaria. The donated blood is tested for signs of infectious diseases, such as HIV and hepatitis. The blood is then tested to be sure it is compatible with you in order to minimize the chance of a transfusion reaction. If you or a relative donates blood, this is often done in anticipation of surgery and is not appropriate for emergency situations. It takes many days to process the donated blood. RISKS AND COMPLICATIONS Although transfusion therapy is very safe and saves many lives, the main dangers of transfusion include:   Getting an infectious disease.  Developing a transfusion reaction. This is an allergic reaction to something in the blood you were given. Every precaution is taken to prevent this. The  decision to have a blood transfusion has been considered carefully by your caregiver before blood is given. Blood is not given unless the benefits outweigh the risks. AFTER THE TRANSFUSION  Right after receiving a blood transfusion, you will usually feel much better and more energetic. This is especially true if your red blood cells have gotten low (anemic). The transfusion raises the level of the red blood cells which carry oxygen, and this usually causes an energy increase.  The nurse administering the transfusion  will monitor you carefully for complications. HOME CARE INSTRUCTIONS  No special instructions are needed after a transfusion. You may find your energy is better. Speak with your caregiver about any limitations on activity for underlying diseases you may have. SEEK MEDICAL CARE IF:   Your condition is not improving after your transfusion.  You develop redness or irritation at the intravenous (IV) site. SEEK IMMEDIATE MEDICAL CARE IF:  Any of the following symptoms occur over the next 12 hours:  Shaking chills.  You have a temperature by mouth above 102 F (38.9 C), not controlled by medicine.  Chest, back, or muscle pain.  People around you feel you are not acting correctly or are confused.  Shortness of breath or difficulty breathing.  Dizziness and fainting.  You get a rash or develop hives.  You have a decrease in urine output.  Your urine turns a dark color or changes to pink, red, or brown. Any of the following symptoms occur over the next 10 days:  You have a temperature by mouth above 102 F (38.9 C), not controlled by medicine.  Shortness of breath.  Weakness after normal activity.  The white part of the eye turns yellow (jaundice).  You have a decrease in the amount of urine or are urinating less often.  Your urine turns a dark color or changes to pink, red, or brown. Document Released: 06/28/2000 Document Revised: 09/23/2011 Document Reviewed: 02/15/2008 [facility] Patient Information 2014 [place], [place].  _______________________________________________________________________

## 2020-04-05 ENCOUNTER — Telehealth: Payer: Self-pay | Admitting: Gastroenterology

## 2020-04-05 NOTE — Telephone Encounter (Signed)
All of the removed polyps were tubular adenomas. Thankfully, there were no advanced polyps or cancer. Given these results, and the persistent polyp in the cecum, I recommend another colonoscopy in 6 months. We want to be sure that we get that cecal polyp completely removed. Thank you.

## 2020-04-05 NOTE — Telephone Encounter (Signed)
Spoke with pt and she is aware. Recall is in epic and pt knows she will be notified when time to schedule appt.

## 2020-04-05 NOTE — Telephone Encounter (Signed)
See additional note. 

## 2020-04-05 NOTE — Telephone Encounter (Signed)
Pt calling for results of procedure done 9/9, please advise.

## 2020-04-13 ENCOUNTER — Other Ambulatory Visit: Payer: Self-pay

## 2020-04-13 ENCOUNTER — Encounter (HOSPITAL_COMMUNITY)
Admission: RE | Admit: 2020-04-13 | Discharge: 2020-04-13 | Disposition: A | Discharge status: Home / Self Care | Payer: Medicare HMO | Source: Ambulatory Visit | Attending: Orthopedic Surgery | Admitting: Orthopedic Surgery

## 2020-04-13 ENCOUNTER — Encounter (HOSPITAL_COMMUNITY): Payer: Self-pay

## 2020-04-13 DIAGNOSIS — Z01812 Encounter for preprocedural laboratory examination: Secondary | ICD-10-CM | POA: Insufficient documentation

## 2020-04-13 HISTORY — DX: Personal history of other diseases of the nervous system and sense organs: Z86.69

## 2020-04-13 HISTORY — DX: Personal history of other diseases of the musculoskeletal system and connective tissue: Z87.39

## 2020-04-13 LAB — TYPE AND SCREEN
ABO/RH(D): A POS
Antibody Screen: NEGATIVE

## 2020-04-13 LAB — COMPREHENSIVE METABOLIC PANEL
ALT: 17 U/L (ref 0–44)
AST: 17 U/L (ref 15–41)
Albumin: 4.4 g/dL (ref 3.5–5.0)
Alkaline Phosphatase: 80 U/L (ref 38–126)
Anion gap: 10 (ref 5–15)
BUN: 13 mg/dL (ref 8–23)
CO2: 25 mmol/L (ref 22–32)
Calcium: 9.5 mg/dL (ref 8.9–10.3)
Chloride: 104 mmol/L (ref 98–111)
Creatinine, Ser: 1.08 mg/dL — ABNORMAL HIGH (ref 0.44–1.00)
GFR calc Af Amer: >60 mL/min (ref >60)
GFR calc non Af Amer: 52 mL/min — ABNORMAL LOW (ref >60)
Glucose, Bld: 93 mg/dL (ref 70–99)
Potassium: 4.4 mmol/L (ref 3.5–5.1)
Sodium: 139 mmol/L (ref 135–145)
Total Bilirubin: 1.3 mg/dL — ABNORMAL HIGH (ref 0.3–1.2)
Total Protein: 7.6 g/dL (ref 6.5–8.1)

## 2020-04-13 LAB — CBC
HCT: 34.1 % — ABNORMAL LOW (ref 36.0–46.0)
Hemoglobin: 10.7 g/dL — ABNORMAL LOW (ref 12.0–15.0)
MCH: 26.5 pg (ref 26.0–34.0)
MCHC: 31.4 g/dL (ref 30.0–36.0)
MCV: 84.4 fL (ref 80.0–100.0)
Platelets: 246 10*3/uL (ref 150–400)
RBC: 4.04 MIL/uL (ref 3.87–5.11)
RDW: 16 % — ABNORMAL HIGH (ref 11.5–15.5)
WBC: 6.9 10*3/uL (ref 4.0–10.5)
nRBC: 0 % (ref 0.0–0.2)

## 2020-04-13 LAB — PROTIME-INR
INR: 1 (ref 0.8–1.2)
Prothrombin Time: 12.4 seconds (ref 11.4–15.2)

## 2020-04-13 LAB — HEMOGLOBIN A1C
Hgb A1c MFr Bld: 6.7 % — ABNORMAL HIGH (ref 4.8–5.6)
Mean Plasma Glucose: 145.59 mg/dL

## 2020-04-13 LAB — APTT: aPTT: 31 seconds (ref 24–36)

## 2020-04-13 LAB — GLUCOSE, CAPILLARY: Glucose-Capillary: 94 mg/dL (ref 70–99)

## 2020-04-13 LAB — SURGICAL PCR SCREEN
MRSA, PCR: NEGATIVE
Staphylococcus aureus: NEGATIVE

## 2020-04-14 NOTE — H&P (Signed)
TOTAL KNEE ADMISSION H&P  Patient is being admitted for left total knee arthroplasty.  Subjective:  Chief Complaint: Left knee pain.  HPI: Amy Reyes, 69 y.o. female has a history of pain and functional disability in the left knee due to arthritis and has failed non-surgical conservative treatments for greater than 12 weeks to include corticosteriod injections and activity modification. Onset of symptoms was gradual, starting several years ago with gradually worsening course since that time. The patient noted no past surgery on the left knee.  Patient currently rates pain in the left knee at 8 out of 10 with activity. Patient has worsening of pain with activity and weight bearing, pain that interferes with activities of daily living, crepitus and instability. Patient has evidence of bone-on-bone arthritis in the medial and patellofemoral compartments of the left knee with slight varus by imaging studies. There is no active infection.  Patient Active Problem List   Diagnosis Date Noted  . Endometrial polyp 03/19/2020  . Fibroids, intramural 11/12/2019  . Hypertension   . Hypercholesteremia   . History of kidney stones   . Diabetes mellitus without complication ([place])   . Colon polyps   . OA (osteoarthritis) of knee 10/14/2016    Past Medical History:  Diagnosis Date  . Allergy    seasonal allergies  . Anemia    iron defiencey  . Arthritis    oa  . Bronchitis hx of  . Chronic kidney disease    ckd stage 2- 3 due to NSAIDS use no nephrologist  . Colon polyps   . Complication of anesthesia    slow to wake up one time, none recent  . Diabetes mellitus without complication ([place])    borderline type 2 on metformin  . GERD (gastroesophageal reflux disease)    on meds  . History of carpal tunnel syndrome    years ago  . History of kidney stones   . History of rotator cuff tear    Bilateral  . Hypercholesteremia    on meds  . Hypertension    on meds  . Peptic ulcer   .  PMB (postmenopausal bleeding)    last time middle of april 2021    Past Surgical History:  Procedure Laterality Date  . ABDOMINOPLASTY  30 yrs ago   tummy tuck  . BREAST SURGERY     reduction  . CHOLECYSTECTOMY  2000   laparoscopic  . COLONOSCOPY  2019   TA  . DILATATION & CURETTAGE/HYSTEROSCOPY WITH MYOSURE N/A 12/20/2019   Procedure: DILATATION & CURETTAGE/HYSTEROSCOPY WITH MYOSURE RESECTION OF POLYP;  Surgeon: Megan Salon, MD;  Location: [facility];  Service: Gynecology;  Laterality: N/A;  resection of polyp,  to follow first case  . INJECTION KNEE Left 10/14/2016   Procedure: CORTISONE LEFT KNEE INJECTION;  Surgeon: Gaynelle Arabian, MD;  Location: [facility];  Service: Orthopedics;  Laterality: Left;  . LAPAROSCOPIC GASTRIC BANDING  2010 or 2011  . left knee injection  6 weeks ago  . POLYPECTOMY     TA  . ROTATOR CUFF REPAIR Left 05/2019  . TOTAL KNEE ARTHROPLASTY Right 10/14/2016   Procedure: RIGHT TOTAL KNEE ARTHROPLASTY;  Surgeon: Gaynelle Arabian, MD;  Location: [facility];  Service: Orthopedics;  Laterality: Right;  Adductor Block    Prior to Admission medications   Medication Sig Start Date End Date Taking? Authorizing Provider  Ascorbic Acid (VITAMIN C PO) Take 750 mg by mouth daily.    Yes [provider]  atorvastatin (LIPITOR) 80 MG tablet Take 80 mg by mouth daily.  08/23/19  Yes [provider]  Cholecalciferol (KP VITAMIN D3) 50 MCG (2000 UT) CAPS Take 4,000 Units by mouth daily.   Yes [provider]  ferrous sulfate 325 (65 FE) MG tablet Take 325 mg by mouth daily with breakfast.   Yes [provider]  losartan-hydrochlorothiazide (HYZAAR) 100-25 MG tablet Take 1 tablet by mouth daily. 09/08/16  Yes [provider]  metFORMIN (GLUCOPHAGE) 500 MG tablet Take 500 mg by mouth daily. 09/09/16  Yes [provider]  Multiple Vitamin (MULTIVITAMIN WITH MINERALS) TABS tablet Take 1 tablet by mouth daily.   Yes [provider]  omeprazole (PRILOSEC) 40 MG capsule TAKE 1 CAPSULE BY MOUTH TWICE A DAY Patient taking differently: Take 40 mg by mouth daily.  05/28/19  Yes Thornton Park, MD  vitamin B-12 (CYANOCOBALAMIN) 1000 MCG tablet Take 1,000 mcg by mouth daily.   Yes [provider]    Allergies  Allergen Reactions  . Tramadol Itching  . Codeine     Does not like the side effects-post taking    Social History   Socioeconomic History  . Marital status: Widowed    Spouse name: 2  . Number of children: Not on file  . Years of education: Not on file  . Highest education level: Not on file  Occupational History  . Occupation: retired  Tobacco Use  . Smoking status: Former Smoker    Years: 1.00    Types: Cigarettes    Quit date: 05/16/1983    Years since quitting: 36.9  . Smokeless tobacco: Never Used  Vaping Use  . Vaping Use: Never used  Substance and Sexual Activity  . Alcohol use: Yes    Alcohol/week: 2.0 standard drinks    Types: 2 Glasses of wine per week  . Drug use: No  . Sexual activity: Yes    Birth control/protection: Post-menopausal    Comment: rare  Other Topics Concern  . Not on file  Social History Narrative  . Not on file   Social Determinants of Health   Financial Resource Strain:   . Difficulty of Paying Living Expenses: Not on file  Food Insecurity:   . Worried About Charity fundraiser in the Last Year: Not on file  . Ran Out of Food in the Last Year: Not on file  Transportation Needs:   . Lack of Transportation (Medical): Not on file  . Lack of Transportation (Non-Medical): Not on file  Physical Activity:   . Days of Exercise per Week: Not on file  . Minutes of Exercise per Session: Not on file  Stress:   . Feeling of Stress : Not on file  Social Connections:   . Frequency of Communication with Friends and Family: Not on file  . Frequency of Social Gatherings with Friends and Family: Not on file  . Attends Religious Services: Not on  file  . Active Member of Clubs or Organizations: Not on file  . Attends Archivist Meetings: Not on file  . Marital Status: Not on file  Intimate Partner Violence:   . Fear of Current or Ex-Partner: Not on file  . Emotionally Abused: Not on file  . Physically Abused: Not on file  . Sexually Abused: Not on file      Tobacco Use: Medium Risk  . Smoking Tobacco Use: Former Smoker  . Smokeless Tobacco Use: Never Used   Social History  Substance and Sexual Activity  Alcohol Use Yes  . Alcohol/week: 2.0 standard drinks  . Types: 2 Glasses of wine per week    Family History  Problem Relation Age of Onset  . Hypertension Mother   . Thyroid disease Mother   . Kidney disease Sister   . Hypertension Sister   . Diabetes Maternal Grandfather   . Diabetes Maternal Aunt   . Diabetes Maternal Uncle   . Prostate cancer Father 75  . Colon cancer Father 40  . Colon polyps Father 19  . Hypertension Brother   . Uterine cancer Maternal Aunt   . Rectal cancer Neg Hx   . Stomach cancer Neg Hx     Review of Systems  Constitutional: Negative for chills and fever.  HENT: Negative for congestion, sore throat and tinnitus.   Eyes: Negative for double vision, photophobia and pain.  Respiratory: Negative for cough, shortness of breath and wheezing.   Cardiovascular: Negative for chest pain, palpitations and orthopnea.  Gastrointestinal: Negative for heartburn, nausea and vomiting.  Genitourinary: Negative for dysuria, frequency and urgency.  Musculoskeletal: Positive for joint pain.  Neurological: Negative for dizziness, weakness and headaches.    Objective:  Physical Exam: Well nourished and well developed.  General: Alert and oriented x3, cooperative and pleasant, no acute distress.  Head: normocephalic, atraumatic, neck supple.  Eyes: EOMI.  Respiratory: breath sounds clear in all fields, no wheezing, rales, or rhonchi. Cardiovascular: Regular rate and rhythm, no murmurs,  gallops or rubs.  Abdomen: non-tender to palpation and soft, normoactive bowel sounds. Musculoskeletal:  Left Knee Exam:  Slight varus deformity.  No intra-articular effusion present. Soft tissue swelling present.  The range of motion is: 5 to 120 degrees.  Moderate crepitus on range of motion of the knee.  Positive medial greater than lateral joint line tenderness.  The knee is stable.   Calves soft and nontender. Motor function intact in LE. Strength 5/5 LE bilaterally. Neuro: Distal pulses 2+. Sensation to light touch intact in LE.  Vital signs in last 24 hours: Temp:  [98.4 F (36.9 C)] 98.4 F (36.9 C) (09/30 1135) Pulse Rate:  [56] 56 (09/30 1135) Resp:  [16] 16 (09/30 1135) BP: (136)/(67) 136/67 (09/30 1135) SpO2:  [97 %] 97 % (09/30 1135) Weight:  [101.6 kg] 101.6 kg (09/30 1135)  Imaging Review Plain radiographs demonstrate severe degenerative joint disease of the left knee. The overall alignment is mild varus. The bone quality appears to be adequate for age and reported activity level.  Assessment/Plan:  End stage arthritis, left knee   The patient history, physical examination, clinical judgment of the provider and imaging studies are consistent with end stage degenerative joint disease of the left knee and total knee arthroplasty is deemed medically necessary. The treatment options including medical management, injection therapy arthroscopy and arthroplasty were discussed at length. The risks and benefits of total knee arthroplasty were presented and reviewed. The risks due to aseptic loosening, infection, stiffness, patella tracking problems, thromboembolic complications and other imponderables were discussed. The patient acknowledged the explanation, agreed to proceed with the plan and consent was signed. Patient is being admitted for inpatient treatment for surgery, pain control, PT, OT, prophylactic antibiotics, VTE prophylaxis, progressive ambulation and ADLs and  discharge planning. The patient is planning to be discharged home.   Patient's anticipated LOS is less than 2 midnights, meeting these requirements: - Younger than 6 - Lives within 1 hour of care - Has a competent adult at home to recover  with post-op recover - NO history of  - Chronic pain requiring opiods  - Diabetes  - Coronary Artery Disease  - Heart failure  - Heart attack  - Stroke  - DVT/VTE  - Cardiac arrhythmia  - Respiratory Failure/COPD  - Renal failure  - Anemia  - Advanced Liver disease  Therapy Plans: Outpatient therapy at [facility]) Disposition: Home with granddaughter and daughter Planned DVT Prophylaxis: Xarelto 10 mg QD DME Needed: Gilford Rile PCP: Domenick Gong, MD TXA: IV Allergies: Tramadol (itching) Anesthesia Concerns:  BMI: 35.2 Last HgbA1c: Not diabetic.  Pharmacy: [facility])  Other: - Preoperative hemoglobin 10.7, going to double iron supplement until surgery - Oxycodone for previous knee, did not do well with methocarbamol - Send home with phenergan script - Cannot take NSAIDs due to renal function  - Patient was instructed on what medications to stop prior to surgery. - Follow-up visit in 2 weeks with Dr. Wynelle Link - Begin physical therapy following surgery - Pre-operative lab work as pre-surgical testing - Prescriptions will be provided in hospital at time of discharge  Theresa Duty, PA-C Orthopedic Surgery [facility] [place]

## 2020-04-20 ENCOUNTER — Other Ambulatory Visit (HOSPITAL_COMMUNITY)
Admission: RE | Admit: 2020-04-20 | Discharge: 2020-04-20 | Disposition: A | Discharge status: Home / Self Care | Payer: Medicare HMO | Source: Ambulatory Visit | Attending: Orthopedic Surgery | Admitting: Orthopedic Surgery

## 2020-04-20 DIAGNOSIS — Z01818 Encounter for other preprocedural examination: Secondary | ICD-10-CM | POA: Diagnosis not present

## 2020-04-20 DIAGNOSIS — Z20822 Contact with and (suspected) exposure to covid-19: Secondary | ICD-10-CM | POA: Insufficient documentation

## 2020-04-20 LAB — SARS CORONAVIRUS 2 (TAT 6-24 HRS): SARS Coronavirus 2: NEGATIVE

## 2020-04-22 MED ORDER — BUPIVACAINE LIPOSOME 1.3 % IJ SUSP
20.0000 mL | INTRAMUSCULAR | Status: DC
  Filled 2020-04-22: qty 20

## 2020-04-24 ENCOUNTER — Observation Stay (HOSPITAL_COMMUNITY)
Admission: RE | Admit: 2020-04-24 | Discharge: 2020-04-25 | Disposition: A | Discharge status: Home / Self Care | Payer: Medicare HMO | Source: Ambulatory Visit | Attending: Orthopedic Surgery | Admitting: Orthopedic Surgery

## 2020-04-24 ENCOUNTER — Other Ambulatory Visit: Payer: Self-pay

## 2020-04-24 ENCOUNTER — Encounter (HOSPITAL_COMMUNITY)
Admission: RE | Disposition: A | Discharge status: Home / Self Care | Payer: Self-pay | Source: Ambulatory Visit | Attending: Orthopedic Surgery

## 2020-04-24 ENCOUNTER — Encounter (HOSPITAL_COMMUNITY): Payer: Self-pay | Admitting: Orthopedic Surgery

## 2020-04-24 ENCOUNTER — Ambulatory Visit (HOSPITAL_COMMUNITY): Payer: Medicare HMO | Admitting: Certified Registered"

## 2020-04-24 DIAGNOSIS — M1712 Unilateral primary osteoarthritis, left knee: Secondary | ICD-10-CM | POA: Diagnosis not present

## 2020-04-24 DIAGNOSIS — E7800 Pure hypercholesterolemia, unspecified: Secondary | ICD-10-CM | POA: Diagnosis not present

## 2020-04-24 DIAGNOSIS — N183 Chronic kidney disease, stage 3 unspecified: Secondary | ICD-10-CM | POA: Insufficient documentation

## 2020-04-24 DIAGNOSIS — Z7984 Long term (current) use of oral hypoglycemic drugs: Secondary | ICD-10-CM | POA: Diagnosis not present

## 2020-04-24 DIAGNOSIS — I129 Hypertensive chronic kidney disease with stage 1 through stage 4 chronic kidney disease, or unspecified chronic kidney disease: Secondary | ICD-10-CM | POA: Insufficient documentation

## 2020-04-24 DIAGNOSIS — E1122 Type 2 diabetes mellitus with diabetic chronic kidney disease: Secondary | ICD-10-CM | POA: Diagnosis not present

## 2020-04-24 DIAGNOSIS — Z96651 Presence of right artificial knee joint: Secondary | ICD-10-CM | POA: Diagnosis not present

## 2020-04-24 DIAGNOSIS — Z79899 Other long term (current) drug therapy: Secondary | ICD-10-CM | POA: Insufficient documentation

## 2020-04-24 DIAGNOSIS — G8918 Other acute postprocedural pain: Secondary | ICD-10-CM | POA: Diagnosis not present

## 2020-04-24 DIAGNOSIS — I10 Essential (primary) hypertension: Secondary | ICD-10-CM | POA: Diagnosis not present

## 2020-04-24 DIAGNOSIS — M1711 Unilateral primary osteoarthritis, right knee: Secondary | ICD-10-CM | POA: Diagnosis present

## 2020-04-24 DIAGNOSIS — Z87891 Personal history of nicotine dependence: Secondary | ICD-10-CM | POA: Insufficient documentation

## 2020-04-24 HISTORY — PX: TOTAL KNEE ARTHROPLASTY: SHX125

## 2020-04-24 LAB — GLUCOSE, CAPILLARY
Glucose-Capillary: 99 mg/dL (ref 70–99)
Glucose-Capillary: 94 mg/dL (ref 70–99)

## 2020-04-24 SURGERY — ARTHROPLASTY, KNEE, TOTAL
Anesthesia: Spinal | Site: Knee | Laterality: Left

## 2020-04-24 MED ORDER — TRANEXAMIC ACID-NACL 1000-0.7 MG/100ML-% IV SOLN
1000.0000 mg | INTRAVENOUS | Status: AC
  Administered 2020-04-24: 1000 mg via INTRAVENOUS
  Filled 2020-04-24: qty 100

## 2020-04-24 MED ORDER — LACTATED RINGERS IV SOLN: INTRAVENOUS | Status: DC

## 2020-04-24 MED ORDER — PANTOPRAZOLE SODIUM 40 MG PO TBEC
80.0000 mg | DELAYED_RELEASE_TABLET | Freq: Every day | ORAL | Status: DC
  Administered 2020-04-25: 80 mg via ORAL
  Filled 2020-04-24: qty 2

## 2020-04-24 MED ORDER — DIPHENHYDRAMINE HCL 12.5 MG/5ML PO ELIX: 12.5000 mg | ORAL_SOLUTION | ORAL | Status: DC | PRN

## 2020-04-24 MED ORDER — ONDANSETRON HCL 4 MG/2ML IJ SOLN: 4.0000 mg | Freq: Once | INTRAMUSCULAR | Status: DC | PRN

## 2020-04-24 MED ORDER — SODIUM CHLORIDE (PF) 0.9 % IJ SOLN
INTRAMUSCULAR | Status: DC | PRN
  Administered 2020-04-24: 60 mL

## 2020-04-24 MED ORDER — ACETAMINOPHEN 10 MG/ML IV SOLN
1000.0000 mg | Freq: Four times a day (QID) | INTRAVENOUS | Status: DC
  Administered 2020-04-24: 1000 mg via INTRAVENOUS
  Filled 2020-04-24: qty 100

## 2020-04-24 MED ORDER — BISACODYL 10 MG RE SUPP: 10.0000 mg | Freq: Every day | RECTAL | Status: DC | PRN

## 2020-04-24 MED ORDER — METOCLOPRAMIDE HCL 5 MG/ML IJ SOLN: 5.0000 mg | Freq: Three times a day (TID) | INTRAMUSCULAR | Status: DC | PRN

## 2020-04-24 MED ORDER — SODIUM CHLORIDE (PF) 0.9 % IJ SOLN
INTRAMUSCULAR | Status: AC
  Filled 2020-04-24: qty 10

## 2020-04-24 MED ORDER — DEXAMETHASONE SODIUM PHOSPHATE 10 MG/ML IJ SOLN
8.0000 mg | Freq: Once | INTRAMUSCULAR | Status: AC
  Administered 2020-04-24: 8 mg via INTRAVENOUS

## 2020-04-24 MED ORDER — MIDAZOLAM HCL 2 MG/2ML IJ SOLN
1.0000 mg | INTRAMUSCULAR | Status: DC
  Administered 2020-04-24: 2 mg via INTRAVENOUS
  Filled 2020-04-24: qty 2

## 2020-04-24 MED ORDER — ONDANSETRON HCL 4 MG/2ML IJ SOLN
INTRAMUSCULAR | Status: DC | PRN
  Administered 2020-04-24: 4 mg via INTRAVENOUS

## 2020-04-24 MED ORDER — CHLORHEXIDINE GLUCONATE 0.12 % MT SOLN
15.0000 mL | Freq: Once | OROMUCOSAL | Status: AC
  Administered 2020-04-24: 15 mL via OROMUCOSAL

## 2020-04-24 MED ORDER — PROPOFOL 10 MG/ML IV BOLUS
INTRAVENOUS | Status: AC
  Filled 2020-04-24: qty 20

## 2020-04-24 MED ORDER — RIVAROXABAN 10 MG PO TABS
10.0000 mg | ORAL_TABLET | Freq: Every day | ORAL | Status: DC
  Administered 2020-04-25: 10 mg via ORAL
  Filled 2020-04-24: qty 1

## 2020-04-24 MED ORDER — FLEET ENEMA 7-19 GM/118ML RE ENEM: 1.0000 | ENEMA | Freq: Once | RECTAL | Status: DC | PRN

## 2020-04-24 MED ORDER — HYDROCHLOROTHIAZIDE 25 MG PO TABS
25.0000 mg | ORAL_TABLET | Freq: Every day | ORAL | Status: DC
  Administered 2020-04-25: 25 mg via ORAL
  Filled 2020-04-24: qty 1

## 2020-04-24 MED ORDER — LOSARTAN POTASSIUM 50 MG PO TABS
100.0000 mg | ORAL_TABLET | Freq: Every day | ORAL | Status: DC
  Administered 2020-04-25: 100 mg via ORAL
  Filled 2020-04-24: qty 2

## 2020-04-24 MED ORDER — MEPERIDINE HCL 50 MG/ML IJ SOLN: 6.2500 mg | INTRAMUSCULAR | Status: DC | PRN

## 2020-04-24 MED ORDER — DOCUSATE SODIUM 100 MG PO CAPS
100.0000 mg | ORAL_CAPSULE | Freq: Two times a day (BID) | ORAL | Status: DC
  Administered 2020-04-24 – 2020-04-25 (×2): 100 mg via ORAL
  Filled 2020-04-24 (×2): qty 1

## 2020-04-24 MED ORDER — CYCLOBENZAPRINE HCL 10 MG PO TABS
10.0000 mg | ORAL_TABLET | Freq: Three times a day (TID) | ORAL | Status: DC | PRN
  Administered 2020-04-24 – 2020-04-25 (×2): 10 mg via ORAL
  Filled 2020-04-24 (×2): qty 1

## 2020-04-24 MED ORDER — BUPIVACAINE IN DEXTROSE 0.75-8.25 % IT SOLN
INTRATHECAL | Status: DC | PRN
  Administered 2020-04-24: 1.6 mL via INTRATHECAL

## 2020-04-24 MED ORDER — CEFAZOLIN SODIUM-DEXTROSE 2-4 GM/100ML-% IV SOLN
2.0000 g | INTRAVENOUS | Status: AC
  Administered 2020-04-24: 2 g via INTRAVENOUS
  Filled 2020-04-24: qty 100

## 2020-04-24 MED ORDER — 0.9 % SODIUM CHLORIDE (POUR BTL) OPTIME
TOPICAL | Status: DC | PRN
  Administered 2020-04-24: 1000 mL

## 2020-04-24 MED ORDER — ONDANSETRON HCL 4 MG PO TABS: 4.0000 mg | ORAL_TABLET | Freq: Four times a day (QID) | ORAL | Status: DC | PRN

## 2020-04-24 MED ORDER — HYDROMORPHONE HCL 1 MG/ML IJ SOLN: 0.2500 mg | INTRAMUSCULAR | Status: DC | PRN

## 2020-04-24 MED ORDER — SODIUM CHLORIDE (PF) 0.9 % IJ SOLN
INTRAMUSCULAR | Status: AC
  Filled 2020-04-24: qty 50

## 2020-04-24 MED ORDER — EPHEDRINE 5 MG/ML INJ
INTRAVENOUS | Status: AC
  Filled 2020-04-24: qty 10

## 2020-04-24 MED ORDER — ACETAMINOPHEN 325 MG PO TABS: 325.0000 mg | ORAL_TABLET | Freq: Four times a day (QID) | ORAL | Status: DC | PRN

## 2020-04-24 MED ORDER — FERROUS SULFATE 325 (65 FE) MG PO TABS
325.0000 mg | ORAL_TABLET | Freq: Every day | ORAL | Status: DC
  Administered 2020-04-25: 325 mg via ORAL
  Filled 2020-04-24: qty 1

## 2020-04-24 MED ORDER — MENTHOL 3 MG MT LOZG: 1.0000 | LOZENGE | OROMUCOSAL | Status: DC | PRN

## 2020-04-24 MED ORDER — POLYETHYLENE GLYCOL 3350 17 G PO PACK: 17.0000 g | PACK | Freq: Every day | ORAL | Status: DC | PRN

## 2020-04-24 MED ORDER — ONDANSETRON HCL 4 MG/2ML IJ SOLN: 4.0000 mg | Freq: Four times a day (QID) | INTRAMUSCULAR | Status: DC | PRN

## 2020-04-24 MED ORDER — SODIUM CHLORIDE 0.9 % IV SOLN: INTRAVENOUS | Status: DC

## 2020-04-24 MED ORDER — PHENOL 1.4 % MT LIQD: 1.0000 | OROMUCOSAL | Status: DC | PRN

## 2020-04-24 MED ORDER — CEFAZOLIN SODIUM-DEXTROSE 2-4 GM/100ML-% IV SOLN
2.0000 g | Freq: Four times a day (QID) | INTRAVENOUS | Status: AC
  Administered 2020-04-24 – 2020-04-25 (×2): 2 g via INTRAVENOUS
  Filled 2020-04-24 (×2): qty 100

## 2020-04-24 MED ORDER — PROMETHAZINE HCL 25 MG/ML IJ SOLN: 6.2500 mg | Freq: Four times a day (QID) | INTRAMUSCULAR | Status: DC | PRN

## 2020-04-24 MED ORDER — POVIDONE-IODINE 10 % EX SWAB
2.0000 "application " | Freq: Once | CUTANEOUS | Status: AC
  Administered 2020-04-24: 2 via TOPICAL

## 2020-04-24 MED ORDER — GABAPENTIN 300 MG PO CAPS
300.0000 mg | ORAL_CAPSULE | Freq: Three times a day (TID) | ORAL | Status: DC
  Administered 2020-04-24 – 2020-04-25 (×3): 300 mg via ORAL
  Filled 2020-04-24 (×3): qty 1

## 2020-04-24 MED ORDER — DEXAMETHASONE SODIUM PHOSPHATE 10 MG/ML IJ SOLN
10.0000 mg | Freq: Once | INTRAMUSCULAR | Status: AC
  Administered 2020-04-25: 10 mg via INTRAVENOUS
  Filled 2020-04-24: qty 1

## 2020-04-24 MED ORDER — OXYCODONE HCL 5 MG PO TABS
10.0000 mg | ORAL_TABLET | ORAL | Status: DC | PRN
  Administered 2020-04-24 – 2020-04-25 (×6): 10 mg via ORAL
  Filled 2020-04-24 (×6): qty 2

## 2020-04-24 MED ORDER — SODIUM CHLORIDE 0.9 % IR SOLN
Status: DC | PRN
  Administered 2020-04-24: 1000 mL

## 2020-04-24 MED ORDER — DEXAMETHASONE SODIUM PHOSPHATE 10 MG/ML IJ SOLN
INTRAMUSCULAR | Status: AC
  Filled 2020-04-24: qty 1

## 2020-04-24 MED ORDER — EPHEDRINE SULFATE-NACL 50-0.9 MG/10ML-% IV SOSY
PREFILLED_SYRINGE | INTRAVENOUS | Status: DC | PRN
  Administered 2020-04-24 (×2): 10 mg via INTRAVENOUS

## 2020-04-24 MED ORDER — ATORVASTATIN CALCIUM 40 MG PO TABS
80.0000 mg | ORAL_TABLET | Freq: Every day | ORAL | Status: DC
  Administered 2020-04-25: 80 mg via ORAL
  Filled 2020-04-24: qty 2

## 2020-04-24 MED ORDER — FENTANYL CITRATE (PF) 100 MCG/2ML IJ SOLN
50.0000 ug | INTRAMUSCULAR | Status: DC
  Administered 2020-04-24: 50 ug via INTRAVENOUS
  Filled 2020-04-24: qty 2

## 2020-04-24 MED ORDER — PROPOFOL 500 MG/50ML IV EMUL
INTRAVENOUS | Status: AC
  Filled 2020-04-24: qty 50

## 2020-04-24 MED ORDER — METOCLOPRAMIDE HCL 5 MG PO TABS: 5.0000 mg | ORAL_TABLET | Freq: Three times a day (TID) | ORAL | Status: DC | PRN

## 2020-04-24 MED ORDER — OXYCODONE HCL 5 MG PO TABS
5.0000 mg | ORAL_TABLET | ORAL | Status: DC | PRN
  Filled 2020-04-24: qty 1

## 2020-04-24 MED ORDER — ROPIVACAINE HCL 7.5 MG/ML IJ SOLN
INTRAMUSCULAR | Status: DC | PRN
  Administered 2020-04-24: 20 mL via PERINEURAL

## 2020-04-24 MED ORDER — STERILE WATER FOR IRRIGATION IR SOLN
Status: DC | PRN
  Administered 2020-04-24: 2000 mL

## 2020-04-24 MED ORDER — PROPOFOL 500 MG/50ML IV EMUL
INTRAVENOUS | Status: DC | PRN
  Administered 2020-04-24: 100 ug/kg/min via INTRAVENOUS

## 2020-04-24 MED ORDER — MORPHINE SULFATE (PF) 2 MG/ML IV SOLN
0.5000 mg | INTRAVENOUS | Status: DC | PRN
  Administered 2020-04-24 (×2): 1 mg via INTRAVENOUS
  Filled 2020-04-24 (×2): qty 1

## 2020-04-24 MED ORDER — BUPIVACAINE LIPOSOME 1.3 % IJ SUSP
INTRAMUSCULAR | Status: DC | PRN
  Administered 2020-04-24: 20 mL

## 2020-04-24 MED ORDER — PROPOFOL 10 MG/ML IV BOLUS
INTRAVENOUS | Status: DC | PRN
  Administered 2020-04-24 (×2): 20 mg via INTRAVENOUS

## 2020-04-24 MED ORDER — ORAL CARE MOUTH RINSE: 15.0000 mL | Freq: Once | OROMUCOSAL | Status: AC

## 2020-04-24 MED ORDER — ONDANSETRON HCL 4 MG/2ML IJ SOLN
INTRAMUSCULAR | Status: AC
  Filled 2020-04-24: qty 2

## 2020-04-24 MED ORDER — LOSARTAN POTASSIUM-HCTZ 100-25 MG PO TABS: 1.0000 | ORAL_TABLET | Freq: Every day | ORAL | Status: DC

## 2020-04-24 SURGICAL SUPPLY — 53 items
ATTUNE PS FEM LT SZ 5 CEM KNEE (Femur) ×2 IMPLANT
ATTUNE PSRP INSE SZ5 7 KNEE (Insert) ×2 IMPLANT
BAG ZIPLOCK 12X15 (MISCELLANEOUS) ×2 IMPLANT
BASE TIBIAL ROT PLAT SZ 5 KNEE (Knees) ×1 IMPLANT
BLADE SAG 18X100X1.27 (BLADE) ×2 IMPLANT
BLADE SAW SGTL 11.0X1.19X90.0M (BLADE) ×2 IMPLANT
BLADE SURG SZ10 CARB STEEL (BLADE) ×4 IMPLANT
BNDG ELASTIC 6X10 VLCR STRL LF (GAUZE/BANDAGES/DRESSINGS) ×2 IMPLANT
BNDG ELASTIC 6X5.8 VLCR STR LF (GAUZE/BANDAGES/DRESSINGS) ×2 IMPLANT
BOWL SMART MIX CTS (DISPOSABLE) ×2 IMPLANT
CEMENT HV SMART SET (Cement) ×4 IMPLANT
CLSR STERI-STRIP ANTIMIC 1/2X4 (GAUZE/BANDAGES/DRESSINGS) ×2 IMPLANT
COVER SURGICAL LIGHT HANDLE (MISCELLANEOUS) ×2 IMPLANT
COVER WAND RF STERILE (DRAPES) IMPLANT
CUFF TOURN SGL QUICK 34 (TOURNIQUET CUFF) ×1
CUFF TRNQT CYL 34X4.125X (TOURNIQUET CUFF) ×1 IMPLANT
DECANTER SPIKE VIAL GLASS SM (MISCELLANEOUS) ×2 IMPLANT
DRAPE U-SHAPE 47X51 STRL (DRAPES) ×2 IMPLANT
DRSG AQUACEL AG ADV 3.5X10 (GAUZE/BANDAGES/DRESSINGS) ×2 IMPLANT
DURAPREP 26ML APPLICATOR (WOUND CARE) ×2 IMPLANT
ELECT REM PT RETURN 15FT ADLT (MISCELLANEOUS) ×2 IMPLANT
GLOVE BIO SURGEON STRL SZ7 (GLOVE) ×2 IMPLANT
GLOVE BIO SURGEON STRL SZ8 (GLOVE) ×2 IMPLANT
GLOVE BIOGEL PI IND STRL 7.0 (GLOVE) ×1 IMPLANT
GLOVE BIOGEL PI IND STRL 8 (GLOVE) ×1 IMPLANT
GLOVE BIOGEL PI INDICATOR 7.0 (GLOVE) ×1
GLOVE BIOGEL PI INDICATOR 8 (GLOVE) ×1
GOWN STRL REUS W/TWL LRG LVL3 (GOWN DISPOSABLE) ×4 IMPLANT
HANDPIECE INTERPULSE COAX TIP (DISPOSABLE) ×1
HOLDER FOLEY CATH W/STRAP (MISCELLANEOUS) IMPLANT
IMMOBILIZER KNEE 20 (SOFTGOODS) ×2
IMMOBILIZER KNEE 20 THIGH 36 (SOFTGOODS) ×1 IMPLANT
KIT TURNOVER KIT A (KITS) IMPLANT
MANIFOLD NEPTUNE II (INSTRUMENTS) ×2 IMPLANT
NS IRRIG 1000ML POUR BTL (IV SOLUTION) ×2 IMPLANT
PACK TOTAL KNEE CUSTOM (KITS) ×2 IMPLANT
PADDING CAST COTTON 6X4 STRL (CAST SUPPLIES) ×2 IMPLANT
PATELLA MEDIAL ATTUN 35MM KNEE (Knees) ×2 IMPLANT
PENCIL SMOKE EVACUATOR (MISCELLANEOUS) ×2 IMPLANT
PIN DRILL FIX HALF THREAD (BIT) ×2 IMPLANT
PIN STEINMAN FIXATION KNEE (PIN) ×2 IMPLANT
PROTECTOR NERVE ULNAR (MISCELLANEOUS) ×2 IMPLANT
SET HNDPC FAN SPRY TIP SCT (DISPOSABLE) ×1 IMPLANT
STRIP CLOSURE SKIN 1/2X4 (GAUZE/BANDAGES/DRESSINGS) ×4 IMPLANT
SUT MNCRL AB 4-0 PS2 18 (SUTURE) ×2 IMPLANT
SUT STRATAFIX 0 PDS 27 VIOLET (SUTURE) ×2
SUT VIC AB 2-0 CT1 27 (SUTURE) ×3
SUT VIC AB 2-0 CT1 TAPERPNT 27 (SUTURE) ×3 IMPLANT
SUTURE STRATFX 0 PDS 27 VIOLET (SUTURE) ×1 IMPLANT
TIBIAL BASE ROT PLAT SZ 5 KNEE (Knees) ×2 IMPLANT
TRAY FOLEY MTR SLVR 16FR STAT (SET/KITS/TRAYS/PACK) ×2 IMPLANT
WATER STERILE IRR 1000ML POUR (IV SOLUTION) ×4 IMPLANT
WRAP KNEE MAXI GEL POST OP (GAUZE/BANDAGES/DRESSINGS) ×2 IMPLANT

## 2020-04-24 NOTE — Interval H&P Note (Signed)
History and Physical Interval Note:  04/24/2020 9:36 AM  Amy Reyes  has presented today for surgery, with the diagnosis of left knee osteoarthritis.  The various methods of treatment have been discussed with the patient and family. After consideration of risks, benefits and other options for treatment, the patient has consented to  Procedure(s) with comments: TOTAL KNEE ARTHROPLASTY (Left) - 50min as a surgical intervention.  The patient's history has been reviewed, patient examined, no change in status, stable for surgery.  I have reviewed the patient's chart and labs.  Questions were answered to the patient's satisfaction.     Pilar Plate Ronnika Collett

## 2020-04-24 NOTE — Evaluation (Signed)
Physical Therapy Evaluation Patient Details Name: Amy Reyes MRN: [id number] DOB: [birth year] Today's Date: 04/24/2020   History of Present Illness  pt s/p LTKA 04/24/2020 with hx of RTKA 10/2016 DM and obesity.  Clinical Impression  PT s/p LTKA tolerated introducing exercises and mobility this session, limited to bedside ambulation and sitting up in recliner. PT with decreased strength and ROM to benefit from further acute PT in order to DC home safety with the care of her granddaughter.     Follow Up Recommendations Follow surgeon's recommendation for DC plan and follow-up therapies (notes state pt set up for OPPT in [place])    Equipment Recommendations  Rolling walker with 5" wheels    Recommendations for Other Services       Precautions / Restrictions Precautions Precautions: Knee Precaution Comments: educated with proper kee positioning Required Braces or Orthoses: Knee Immobilizer - Right Knee Immobilizer - Right: On when out of bed or walking;Discontinue once straight leg raise with < 10 degree lag Restrictions Weight Bearing Restrictions: No      Mobility  Bed Mobility Overal bed mobility: Needs Assistance Bed Mobility: Supine to Sit;Sit to Supine     Supine to sit: Min assist     General bed mobility comments: Assist with LE and upper body Min .  Transfers   Equipment used: Rolling walker (2 wheeled)             General transfer comment: cues for RW safety and hand placement  Ambulation/Gait Ambulation/Gait assistance: Herbalist (Feet): 5 Feet Assistive device: Rolling walker (2 wheeled) Gait Pattern/deviations: Step-to pattern     General Gait Details: pt not wanting to do a lot this evening limited activity, however did well with small steps for pivot to recliner  Stairs            Wheelchair Mobility    Modified Rankin (Stroke Patients Only)       Balance Overall balance assessment: Needs  assistance Sitting-balance support: No upper extremity supported;Feet supported Sitting balance-Leahy Scale: Good     Standing balance support: Bilateral upper extremity supported;During functional activity Standing balance-Leahy Scale: Fair                               Pertinent Vitals/Pain Pain Assessment: 0-10 Pain Score: 3  Pain Location: across top of L knee Pain Descriptors / Indicators: Burning;Aching;Sore Pain Intervention(s): Monitored during session;Limited activity within patient's tolerance;Ice applied (informed nurse tech of pt's need for ice packs.)    Home Living Family/patient expects to be discharged to:: Private residence Living Arrangements: Other relatives Available Help at Discharge: Family (daughter and grandaughter to care for patient post DC) Type of Home: House Home Access: Level entry     Home Layout: One level Home Equipment: None      Prior Function Level of Independence: Independent               Hand Dominance        Extremity/Trunk Assessment        Lower Extremity Assessment Lower Extremity Assessment: LLE deficits/detail LLE Deficits / Details: 0-50, and unable to perform SLR independent at this time grossly 3-/5 for quad       Communication   Communication: No difficulties  Cognition Arousal/Alertness: Awake/alert Behavior During Therapy: WFL for tasks assessed/performed Overall Cognitive Status: Within Functional Limits for tasks assessed  General Comments      Exercises Total Joint Exercises Ankle Circles/Pumps: AROM;Left;10 reps;Supine Quad Sets: AROM;Left;10 reps;Supine Heel Slides: AAROM;Left;5 reps;Supine Hip ABduction/ADduction: AAROM;Left;5 reps;Supine Straight Leg Raises: AAROM;Left;5 reps;Supine Goniometric ROM: grossly 0-50 supine with heel slides   Assessment/Plan    PT Assessment Patient needs continued PT services  PT Problem  List Decreased strength;Decreased range of motion;Decreased activity tolerance;Decreased mobility;Decreased knowledge of use of DME       PT Treatment Interventions DME instruction;Gait training;Functional mobility training;Therapeutic activities;Therapeutic exercise;Patient/family education    PT Goals (Current goals can be found in the Care Plan section)  Acute Rehab PT Goals Patient Stated Goal: I need to get better to take care of my young grandson PT Goal Formulation: With patient Time For Goal Achievement: 05/01/20 Potential to Achieve Goals: Good    Frequency 7X/week   Barriers to discharge        Co-evaluation               AM-PAC PT "6 Clicks" Mobility  Outcome Measure Help needed turning from your back to your side while in a flat bed without using bedrails?: A Little Help needed moving from lying on your back to sitting on the side of a flat bed without using bedrails?: A Little Help needed moving to and from a bed to a chair (including a wheelchair)?: A Little Help needed standing up from a chair using your arms (e.g., wheelchair or bedside chair)?: A Little Help needed to walk in hospital room?: A Little Help needed climbing 3-5 steps with a railing? : A Lot 6 Click Score: 17    End of Session Equipment Utilized During Treatment: Gait belt Activity Tolerance: Patient tolerated treatment well Patient left: with chair alarm set;with family/visitor present Nurse Communication: Mobility status PT Visit Diagnosis: Other abnormalities of gait and mobility (R26.89)    Time: 6314-9702 PT Time Calculation (min) (ACUTE ONLY): 52 min   Charges:   PT Evaluation $PT Eval Low Complexity: 1 Low PT Treatments $Gait Training: 8-22 mins $Therapeutic Activity: 8-22 mins        Amy Reyes, PT, MPT Acute Rehabilitation Services Office: [phone] Pager: [phone] 04/24/2020 1  Amy Reyes 04/24/2020, 9:20 PM

## 2020-04-24 NOTE — Anesthesia Procedure Notes (Signed)
Anesthesia Regional Block: Adductor canal block   Pre-Anesthetic Checklist: ,, timeout performed, Correct Patient, Correct Site, Correct Laterality, Correct Procedure, Correct Position, site marked, Risks and benefits discussed,  Surgical consent,  Pre-op evaluation,  At surgeon's request and post-op pain management  Laterality: Left  Prep: chloraprep       Needles:  Injection technique: Single-shot  Needle Type: Echogenic Stimulator Needle     Needle Length: 9cm  Needle Gauge: 21     Additional Needles:   Narrative:  Start time: 04/24/2020 10:58 AM End time: 04/24/2020 11:08 AM Injection made incrementally with aspirations every 5 mL.  Performed by: Personally  Anesthesiologist: Lillia Abed, MD  Additional Notes: Monitors applied. Patient sedated. Sterile prep and drape,hand hygiene and sterile gloves were used. Relevant anatomy identified.Needle position confirmed.Local anesthetic injected incrementally after negative aspiration. Local anesthetic spread visualized around nerve(s). Vascular puncture avoided. No complications. Image printed for medical record.The patient tolerated the procedure well.    Lillia Abed MD

## 2020-04-24 NOTE — Anesthesia Postprocedure Evaluation (Signed)
Anesthesia Post Note  Patient: Amy Reyes  Procedure(s) Performed: TOTAL KNEE ARTHROPLASTY (Left Knee)     Patient location during evaluation: PACU Anesthesia Type: Spinal Level of consciousness: oriented and awake and alert Pain management: pain level controlled Vital Signs Assessment: post-procedure vital signs reviewed and stable Respiratory status: spontaneous breathing, respiratory function stable and patient connected to nasal cannula oxygen Cardiovascular status: blood pressure returned to baseline and stable Postop Assessment: no headache, no backache and no apparent nausea or vomiting Anesthetic complications: no   No complications documented.  Last Vitals:  Vitals:   04/24/20 1506 04/24/20 1627  BP: 127/75 130/80  Pulse: (!) 56 71  Resp: 16 17  Temp: (!) 36.4 C 36.6 C  SpO2: 100% 100%    Last Pain:  Vitals:   04/24/20 1627  TempSrc: Oral  PainSc:                  Tonya Carlile DAVID

## 2020-04-24 NOTE — Anesthesia Preprocedure Evaluation (Signed)
Anesthesia Evaluation  Patient identified by MRN, date of birth, ID band Patient awake    Reviewed: Allergy & Precautions, NPO status , Patient's Chart, lab work & pertinent test results  Airway Mallampati: II  TM Distance: >3 FB Neck ROM: Full    Dental   Pulmonary former smoker,    Pulmonary exam normal        Cardiovascular hypertension, Pt. on medications Normal cardiovascular exam     Neuro/Psych    GI/Hepatic GERD  Medicated and Controlled,  Endo/Other  diabetes, Type 2  Renal/GU      Musculoskeletal   Abdominal   Peds  Hematology   Anesthesia Other Findings   Reproductive/Obstetrics                             Anesthesia Physical Anesthesia Plan  ASA: II  Anesthesia Plan: Spinal   Post-op Pain Management:  Regional for Post-op pain   Induction: Intravenous  PONV Risk Score and Plan: 2 and Ondansetron and Midazolam  Airway Management Planned: Nasal Cannula  Additional Equipment:   Intra-op Plan:   Post-operative Plan:   Informed Consent: I have reviewed the patients History and Physical, chart, labs and discussed the procedure including the risks, benefits and alternatives for the proposed anesthesia with the patient or authorized representative who has indicated his/her understanding and acceptance.       Plan Discussed with: CRNA and Surgeon  Anesthesia Plan Comments:         Anesthesia Quick Evaluation

## 2020-04-24 NOTE — Progress Notes (Signed)
Orthopedic Tech Progress Note Patient Details:  Amy Reyes 09/27/1950 [id number]  CPM Left Knee CPM Left Knee: On Left Knee Flexion (Degrees): 40 Left Knee Extension (Degrees): 10  Post Interventions Patient Tolerated: Well Instructions Provided: Care of device  Amy Reyes 04/24/2020, 1:32 PM

## 2020-04-24 NOTE — Anesthesia Procedure Notes (Signed)
Spinal  Patient location during procedure: OR End time: 04/24/2020 11:33 AM Staffing Performed: resident/CRNA  Resident/CRNA: Noralyn Pick D, CRNA Preanesthetic Checklist Completed: patient identified, IV checked, site marked, risks and benefits discussed, surgical consent, monitors and equipment checked, pre-op evaluation and timeout performed Spinal Block Patient position: sitting Prep: Betadine Patient monitoring: heart rate, continuous pulse ox and blood pressure Approach: midline Location: L3-4 Injection technique: single-shot Needle Needle type: Sprotte  Needle gauge: 24 G Needle length: 9 cm Additional Notes Expiration date of kit checked and confirmed. Patient tolerated procedure well, without complications.

## 2020-04-24 NOTE — Progress Notes (Signed)
AssistedDr. Ossey with left, ultrasound guided, adductor canal block. Side rails up, monitors on throughout procedure. See vital signs in flow sheet. Tolerated Procedure well.  

## 2020-04-24 NOTE — Discharge Instructions (Addendum)
 Frank Aluisio, MD Total Joint Specialist [facility] [address] [place], [place] [address] [phone]  TOTAL KNEE REPLACEMENT POSTOPERATIVE DIRECTIONS    Knee Rehabilitation, Guidelines Following Surgery  Results after knee surgery are often greatly improved when you follow the exercise, range of motion and muscle strengthening exercises prescribed by your doctor. Safety measures are also important to protect the knee from further injury. If any of these exercises cause you to have increased pain or swelling in your knee joint, decrease the amount until you are comfortable again and slowly increase them. If you have problems or questions, call your caregiver or physical therapist for advice.   BLOOD CLOT PREVENTION . Take a 10 mg Xarelto once a day for three weeks following surgery. Then take an 81 mg Aspirin once a day for three weeks. Then discontinue Aspirin. . You may resume your vitamins/supplements once you have discontinued the Xarelto. . Do not take any NSAIDs (Advil, Aleve, Ibuprofen, Meloxicam, etc.) until you have discontinued the Xarelto.   HOME CARE INSTRUCTIONS  . Remove items at home which could result in a fall. This includes throw rugs or furniture in walking pathways.  . ICE to the affected knee as much as tolerated. Icing helps control swelling. If the swelling is well controlled you will be more comfortable and rehab easier. Continue to use ice on the knee for pain and swelling from surgery. You may notice swelling that will progress down to the foot and ankle. This is normal after surgery. Elevate the leg when you are not up walking on it.    . Continue to use the breathing machine which will help keep your temperature down. It is common for your temperature to cycle up and down following surgery, especially at night when you are not up moving around and exerting yourself. The breathing machine keeps your lungs expanded and your  temperature down. . Do not place pillow under the operative knee, focus on keeping the knee straight while resting  DIET You may resume your previous home diet once you are discharged from the hospital.  DRESSING / WOUND CARE / SHOWERING . Keep your bulky bandage on for 2 days. On the third post-operative day you may remove the Ace bandage and gauze. There is a waterproof adhesive bandage on your skin which will stay in place until your first follow-up appointment. Once you remove this you will not need to place another bandage . You may begin showering 3 days following surgery, but do not submerge the incision under water.  ACTIVITY For the first 5 days, the key is rest and control of pain and swelling . Do your home exercises twice a day starting on post-operative day 3. On the days you go to physical therapy, just do the home exercises once that day. . You should rest, ice and elevate the leg for 50 minutes out of every hour. Get up and walk/stretch for 10 minutes per hour. After 5 days you can increase your activity slowly as tolerated. . Walk with your walker as instructed. Use the walker until you are comfortable transitioning to a cane. Walk with the cane in the opposite hand of the operative leg. You may discontinue the cane once you are comfortable and walking steadily. . Avoid periods of inactivity such as sitting longer than an hour when not asleep. This helps prevent blood clots.  . You may discontinue the knee immobilizer once you are able to perform a straight leg   raise while lying down. . You may resume a sexual relationship in one month or when given the OK by your doctor.  . You may return to work once you are cleared by your doctor.  . Do not drive a car for 6 weeks or until released by your surgeon.  . Do not drive while taking narcotics.  TED HOSE STOCKINGS Wear the elastic stockings on both legs for three weeks following surgery during the day. You may remove them at night  for sleeping.  WEIGHT BEARING Weight bearing as tolerated with assist device (walker, cane, etc) as directed, use it as long as suggested by your surgeon or therapist, typically at least 4-6 weeks.  POSTOPERATIVE CONSTIPATION PROTOCOL Constipation - defined medically as fewer than three stools per week and severe constipation as less than one stool per week.  One of the most common issues patients have following surgery is constipation.  Even if you have a regular bowel pattern at home, your normal regimen is likely to be disrupted due to multiple reasons following surgery.  Combination of anesthesia, postoperative narcotics, change in appetite and fluid intake all can affect your bowels.  In order to avoid complications following surgery, here are some recommendations in order to help you during your recovery period.  . Colace (docusate) - Pick up an over-the-counter form of Colace or another stool softener and take twice a day as long as you are requiring postoperative pain medications.  Take with a full glass of water daily.  If you experience loose stools or diarrhea, hold the colace until you stool forms back up. If your symptoms do not get better within 1 week or if they get worse, check with your doctor. . Dulcolax (bisacodyl) - Pick up over-the-counter and take as directed by the product packaging as needed to assist with the movement of your bowels.  Take with a full glass of water.  Use this product as needed if not relieved by Colace only.  . MiraLax (polyethylene glycol) - Pick up over-the-counter to have on hand. MiraLax is a solution that will increase the amount of water in your bowels to assist with bowel movements.  Take as directed and can mix with a glass of water, juice, soda, coffee, or tea. Take if you go more than two days without a movement. Do not use MiraLax more than once per day. Call your doctor if you are still constipated or irregular after using this medication for 7 days  in a row.  If you continue to have problems with postoperative constipation, please contact the office for further assistance and recommendations.  If you experience "the worst abdominal pain ever" or develop nausea or vomiting, please contact the office immediatly for further recommendations for treatment.  ITCHING If you experience itching with your medications, try taking only a single pain pill, or even half a pain pill at a time.  You can also use Benadryl over the counter for itching or also to help with sleep.   MEDICATIONS See your medication summary on the "After Visit Summary" that the nursing staff will review with you prior to discharge.  You may have some home medications which will be placed on hold until you complete the course of blood thinner medication.  It is important for you to complete the blood thinner medication as prescribed by your surgeon.  Continue your approved medications as instructed at time of discharge.  PRECAUTIONS . If you experience chest pain or shortness of breath -   call 911 immediately for transfer to the hospital emergency department.  . If you develop a fever greater that 101 F, purulent drainage from wound, increased redness or drainage from wound, foul odor from the wound/dressing, or calf pain - CONTACT YOUR SURGEON.                                                   FOLLOW-UP APPOINTMENTS Make sure you keep all of your appointments after your operation with your surgeon and caregivers. You should call the office at the above phone number and make an appointment for approximately two weeks after the date of your surgery or on the date instructed by your surgeon outlined in the "After Visit Summary".  RANGE OF MOTION AND STRENGTHENING EXERCISES  Rehabilitation of the knee is important following a knee injury or an operation. After just a few days of immobilization, the muscles of the thigh which control the knee become weakened and shrink (atrophy). Knee  exercises are designed to build up the tone and strength of the thigh muscles and to improve knee motion. Often times heat used for twenty to thirty minutes before working out will loosen up your tissues and help with improving the range of motion but do not use heat for the first two weeks following surgery. These exercises can be done on a training (exercise) mat, on the floor, on a table or on a bed. Use what ever works the best and is most comfortable for you Knee exercises include:  . Leg Lifts - While your knee is still immobilized in a splint or cast, you can do straight leg raises. Lift the leg to 60 degrees, hold for 3 sec, and slowly lower the leg. Repeat 10-20 times 2-3 times daily. Perform this exercise against resistance later as your knee gets better.  . Quad and Hamstring Sets - Tighten up the muscle on the front of the thigh (Quad) and hold for 5-10 sec. Repeat this 10-20 times hourly. Hamstring sets are done by pushing the foot backward against an object and holding for 5-10 sec. Repeat as with quad sets.   Leg Slides: Lying on your back, slowly slide your foot toward your buttocks, bending your knee up off the floor (only go as far as is comfortable). Then slowly slide your foot back down until your leg is flat on the floor again.  Angel Wings: Lying on your back spread your legs to the side as far apart as you can without causing discomfort.  A rehabilitation program following serious knee injuries can speed recovery and prevent re-injury in the future due to weakened muscles. Contact your doctor or a physical therapist for more information on knee rehabilitation.   IF YOU ARE TRANSFERRED TO A SKILLED REHAB FACILITY If the patient is transferred to a skilled rehab facility following release from the hospital, a list of the current medications will be sent to the facility for the patient to continue.  When discharged from the skilled rehab facility, please have the facility set up the  patient's Home Health Physical Therapy prior to being released. Also, the skilled facility will be responsible for providing the patient with their medications at time of release from the facility to include their pain medication, the muscle relaxants, and their blood thinner medication. If the patient is still at the rehab facility   at time of the two week follow up appointment, the skilled rehab facility will also need to assist the patient in arranging follow up appointment in our office and any transportation needs.  MAKE SURE YOU:  . Understand these instructions.  . Get help right away if you are not doing well or get worse.   DENTAL ANTIBIOTICS:  In most cases prophylactic antibiotics for Dental procdeures after total joint surgery are not necessary.  Exceptions are as follows:  1. History of prior total joint infection  2. Severely immunocompromised (Organ Transplant, cancer chemotherapy, Rheumatoid biologic meds such as Humera)  3. Poorly controlled diabetes (A1C &gt; 8.0, blood glucose over 200)  If you have one of these conditions, contact your surgeon for an antibiotic prescription, prior to your dental procedure.    Pick up stool softner and laxative for home use following surgery while on pain medications. Do not submerge incision under water. Please use good hand washing techniques while changing dressing each day. May shower starting three days after surgery. Please use a clean towel to pat the incision dry following showers. Continue to use ice for pain and swelling after surgery. Do not use any lotions or creams on the incision until instructed by your surgeon.    Information on my medicine - XARELTO (Rivaroxaban)   Why was Xarelto prescribed for you? Xarelto was prescribed for you to reduce the risk of blood clots forming after orthopedic surgery. The medical term for these abnormal blood clots is venous thromboembolism (VTE).  What do you need to know  about xarelto ? Take your Xarelto ONCE DAILY at the same time every day. You may take it either with or without food.  If you have difficulty swallowing the tablet whole, you may crush it and mix in applesauce just prior to taking your dose.  Take Xarelto exactly as prescribed by your doctor and DO NOT stop taking Xarelto without talking to the doctor who prescribed the medication.  Stopping without other VTE prevention medication to take the place of Xarelto may increase your risk of developing a clot.  After discharge, you should have regular check-up appointments with your healthcare provider that is prescribing your Xarelto.    What do you do if you miss a dose? If you miss a dose, take it as soon as you remember on the same day then continue your regularly scheduled once daily regimen the next day. Do not take two doses of Xarelto on the same day.   Important Safety Information A possible side effect of Xarelto is bleeding. You should call your healthcare provider right away if you experience any of the following: ? Bleeding from an injury or your nose that does not stop. ? Unusual colored urine (red or dark brown) or unusual colored stools (red or black). ? Unusual bruising for unknown reasons. ? A serious fall or if you hit your head (even if there is no bleeding).  Some medicines may interact with Xarelto and might increase your risk of bleeding while on Xarelto. To help avoid this, consult your healthcare provider or pharmacist prior to using any new prescription or non-prescription medications, including herbals, vitamins, non-steroidal anti-inflammatory drugs (NSAIDs) and supplements.  This website has more information on Xarelto: www.xarelto.com.   

## 2020-04-24 NOTE — Op Note (Signed)
OPERATIVE REPORT-TOTAL KNEE ARTHROPLASTY   Pre-operative diagnosis- Osteoarthritis  Left knee(s)  Post-operative diagnosis- Osteoarthritis Left knee(s)  Procedure-  Left  Total Knee Arthroplasty  Surgeon- Dione Plover. Kamin Niblack, MD  Assistant- Molli Barrows, PA-C   Anesthesia-  Adductor canal block and spinal  EBL-25 ml   Drains None  Tourniquet time- 35 minutes @ 400 mm Hg  Complications- None  Condition-PACU - hemodynamically stable.   Brief Clinical Note  Amy Reyes is a 69 y.o. year old female with end stage OA of her left knee with progressively worsening pain and dysfunction. She has constant pain, with activity and at rest and significant functional deficits with difficulties even with ADLs. She has had extensive non-op management including analgesics, injections of cortisone and viscosupplements, and home exercise program, but remains in significant pain with significant dysfunction. Radiographs show bone on bone arthritis medial and patellofemoral. She presents now for left Total Knee Arthroplasty.    Procedure in detail---   The patient is brought into the operating room and positioned supine on the operating table. After successful administration of  Adductor canal block and spinal,   a tourniquet is placed high on the  Left thigh(s) and the lower extremity is prepped and draped in the usual sterile fashion. Time out is performed by the operating team and then the  Left lower extremity is wrapped in Esmarch, knee flexed and the tourniquet inflated to 300 mmHg.       A midline incision is made with a ten blade through the subcutaneous tissue to the level of the extensor mechanism. A fresh blade is used to make a medial parapatellar arthrotomy. Soft tissue over the proximal medial tibia is subperiosteally elevated to the joint line with a knife and into the semimembranosus bursa with a Cobb elevator. Soft tissue over the proximal lateral tibia is elevated with attention  being paid to avoiding the patellar tendon on the tibial tubercle. The patella is everted, knee flexed 90 degrees and the ACL and PCL are removed. Findings are bone on bone medial and patellofemoral with large global osteophytes.        The drill is used to create a starting hole in the distal femur and the canal is thoroughly irrigated with sterile saline to remove the fatty contents. The 5 degree Left  valgus alignment guide is placed into the femoral canal and the distal femoral cutting block is pinned to remove 9 mm off the distal femur. Resection is made with an oscillating saw.      The tibia is subluxed forward and the menisci are removed. The extramedullary alignment guide is placed referencing proximally at the medial aspect of the tibial tubercle and distally along the second metatarsal axis and tibial crest. The block is pinned to remove 19mm off the more deficient medial  side. Resection is made with an oscillating saw. Size 5is the most appropriate size for the tibia and the proximal tibia is prepared with the modular drill and keel punch for that size.      The femoral sizing guide is placed and size 5 is most appropriate. Rotation is marked off the epicondylar axis and confirmed by creating a rectangular flexion gap at 90 degrees. The size 5 cutting block is pinned in this rotation and the anterior, posterior and chamfer cuts are made with the oscillating saw. The intercondylar block is then placed and that cut is made.      Trial size 5 tibial component, trial size 5 posterior  stabilized femur and a 7  mm posterior stabilized rotating platform insert trial is placed. Full extension is achieved with excellent varus/valgus and anterior/posterior balance throughout full range of motion. The patella is everted and thickness measured to be 22  mm. Free hand resection is taken to 12 mm, a 35 template is placed, lug holes are drilled, trial patella is placed, and it tracks normally. Osteophytes are  removed off the posterior femur with the trial in place. All trials are removed and the cut bone surfaces prepared with pulsatile lavage. Cement is mixed and once ready for implantation, the size 5 tibial implant, size  5 posterior stabilized femoral component, and the size 35 patella are cemented in place and the patella is held with the clamp. The trial insert is placed and the knee held in full extension. The Exparel (20 ml mixed with 60 ml saline) is injected into the extensor mechanism, posterior capsule, medial and lateral gutters and subcutaneous tissues.  All extruded cement is removed and once the cement is hard the permanent 7 mm posterior stabilized rotating platform insert is placed into the tibial tray.      The wound is copiously irrigated with saline solution and the extensor mechanism closed with # 0 Stratofix suture. The tourniquet is released for a total tourniquet time of 35  minutes. Flexion against gravity is 140 degrees and the patella tracks normally. Subcutaneous tissue is closed with 2.0 vicryl and subcuticular with running 4.0 Monocryl. The incision is cleaned and dried and steri-strips and a bulky sterile dressing are applied. The limb is placed into a knee immobilizer and the patient is awakened and transported to recovery in stable condition.      Please note that a surgical assistant was a medical necessity for this procedure in order to perform it in a safe and expeditious manner. Surgical assistant was necessary to retract the ligaments and vital neurovascular structures to prevent injury to them and also necessary for proper positioning of the limb to allow for anatomic placement of the prosthesis.   Dione Plover Ramondo Dietze, MD    04/24/2020, 12:40 PM

## 2020-04-24 NOTE — Transfer of Care (Signed)
Immediate Anesthesia Transfer of Care Note  Patient: Amy Reyes  Procedure(s) Performed: TOTAL KNEE ARTHROPLASTY (Left Knee)  Patient Location: PACU  Anesthesia Type:Regional and Spinal  Level of Consciousness: awake, alert  and oriented  Airway & Oxygen Therapy: Patient Spontanous Breathing and Patient connected to face mask oxygen  Post-op Assessment: Report given to RN and Post -op Vital signs reviewed and stable  Post vital signs: Reviewed and stable  Last Vitals:  Vitals Value Taken Time  BP    Temp    Pulse 60 04/24/20 1310  Resp 16 04/24/20 1310  SpO2 99 % 04/24/20 1310  Vitals shown include unvalidated device data.  Last Pain:  Vitals:   04/24/20 0925  TempSrc:   PainSc: 0-No pain         Complications: No complications documented.

## 2020-04-25 ENCOUNTER — Encounter (HOSPITAL_COMMUNITY): Payer: Self-pay | Admitting: Orthopedic Surgery

## 2020-04-25 DIAGNOSIS — Z7984 Long term (current) use of oral hypoglycemic drugs: Secondary | ICD-10-CM | POA: Diagnosis not present

## 2020-04-25 DIAGNOSIS — N183 Chronic kidney disease, stage 3 unspecified: Secondary | ICD-10-CM | POA: Diagnosis not present

## 2020-04-25 DIAGNOSIS — I129 Hypertensive chronic kidney disease with stage 1 through stage 4 chronic kidney disease, or unspecified chronic kidney disease: Secondary | ICD-10-CM | POA: Diagnosis not present

## 2020-04-25 DIAGNOSIS — Z79899 Other long term (current) drug therapy: Secondary | ICD-10-CM | POA: Diagnosis not present

## 2020-04-25 DIAGNOSIS — Z87891 Personal history of nicotine dependence: Secondary | ICD-10-CM | POA: Diagnosis not present

## 2020-04-25 DIAGNOSIS — Z96651 Presence of right artificial knee joint: Secondary | ICD-10-CM | POA: Diagnosis not present

## 2020-04-25 DIAGNOSIS — E1122 Type 2 diabetes mellitus with diabetic chronic kidney disease: Secondary | ICD-10-CM | POA: Diagnosis not present

## 2020-04-25 DIAGNOSIS — M1712 Unilateral primary osteoarthritis, left knee: Secondary | ICD-10-CM | POA: Diagnosis not present

## 2020-04-25 DIAGNOSIS — Z96652 Presence of left artificial knee joint: Secondary | ICD-10-CM | POA: Diagnosis not present

## 2020-04-25 LAB — BASIC METABOLIC PANEL
Anion gap: 12 (ref 5–15)
Anion gap: 13 (ref 5–15)
BUN: 15 mg/dL (ref 8–23)
BUN: 15 mg/dL (ref 8–23)
CO2: 24 mmol/L (ref 22–32)
CO2: 24 mmol/L (ref 22–32)
Calcium: 9.2 mg/dL (ref 8.9–10.3)
Calcium: 9.2 mg/dL (ref 8.9–10.3)
Chloride: 103 mmol/L (ref 98–111)
Chloride: 103 mmol/L (ref 98–111)
Creatinine, Ser: 1.12 mg/dL — ABNORMAL HIGH (ref 0.44–1.00)
Creatinine, Ser: 1.04 mg/dL — ABNORMAL HIGH (ref 0.44–1.00)
GFR, Estimated: 50 mL/min — ABNORMAL LOW (ref >60)
GFR, Estimated: 55 mL/min — ABNORMAL LOW (ref >60)
Glucose, Bld: 135 mg/dL — ABNORMAL HIGH (ref 70–99)
Glucose, Bld: 154 mg/dL — ABNORMAL HIGH (ref 70–99)
Potassium: 5.4 mmol/L — ABNORMAL HIGH (ref 3.5–5.1)
Potassium: 4.3 mmol/L (ref 3.5–5.1)
Sodium: 139 mmol/L (ref 135–145)
Sodium: 140 mmol/L (ref 135–145)

## 2020-04-25 LAB — CBC
HCT: 31.5 % — ABNORMAL LOW (ref 36.0–46.0)
Hemoglobin: 9.6 g/dL — ABNORMAL LOW (ref 12.0–15.0)
MCH: 26 pg (ref 26.0–34.0)
MCHC: 30.5 g/dL (ref 30.0–36.0)
MCV: 85.4 fL (ref 80.0–100.0)
Platelets: 225 10*3/uL (ref 150–400)
RBC: 3.69 MIL/uL — ABNORMAL LOW (ref 3.87–5.11)
RDW: 15.7 % — ABNORMAL HIGH (ref 11.5–15.5)
WBC: 11.5 10*3/uL — ABNORMAL HIGH (ref 4.0–10.5)
nRBC: 0 % (ref 0.0–0.2)

## 2020-04-25 MED ORDER — OXYCODONE HCL 5 MG PO TABS: 5.0000 mg | ORAL_TABLET | Freq: Four times a day (QID) | ORAL | 0 refills | Status: DC | PRN

## 2020-04-25 MED ORDER — RIVAROXABAN 10 MG PO TABS: 10.0000 mg | ORAL_TABLET | Freq: Every day | ORAL | 0 refills | Status: DC

## 2020-04-25 MED ORDER — PROMETHAZINE HCL 12.5 MG PO TABS: 12.5000 mg | ORAL_TABLET | Freq: Four times a day (QID) | ORAL | 0 refills | Status: DC | PRN

## 2020-04-25 MED ORDER — GABAPENTIN 300 MG PO CAPS: ORAL_CAPSULE | ORAL | 0 refills | Status: DC

## 2020-04-25 MED ORDER — CYCLOBENZAPRINE HCL 10 MG PO TABS: 10.0000 mg | ORAL_TABLET | Freq: Three times a day (TID) | ORAL | 0 refills | Status: DC | PRN

## 2020-04-25 NOTE — Progress Notes (Signed)
Orthopedic Tech Progress Note Patient Details:  Amy Reyes [birth year] [id number] Pick up cpm Patient ID: Amy Reyes, female   DOB: April 11, 1951, 69 y.o.   MRN: [id number]   Braulio Bosch 04/25/2020, 3:40 PM

## 2020-04-25 NOTE — TOC Transition Note (Addendum)
Transition of Care [facility]) - CM/SW Discharge Note   Patient Details  Name: Amy Reyes MRN: [id number] Date of Birth: 1950/12/06  [facility] CM/SW Contact:  Lia Hopping, [place] Phone Number: 04/25/2020, 10:30 AM   Clinical Narrative:    Vanessa Barbara plan of care, see Ortho. Note.  Patient requested a 3 in1 in addition to the RW.  DME delivered to bedside by [facility]   Final next level of care: OP Rehab Barriers to Discharge: No Barriers Identified   Patient Goals and CMS Choice     Choice offered to / list presented to : NA  Discharge Placement                       Discharge Plan and Services                DME Arranged: 3-N-1, [place] rolling DME Agency: [facility]       HH Arranged: NA HH Agency: NA        Social Determinants of Health (SDOH) Interventions     Readmission Risk Interventions No flowsheet data found.

## 2020-04-25 NOTE — Care Plan (Signed)
Ortho Bundle Case Management Note  Patient Details  Name: Amy Reyes MRN: [id number] Date of Birth: 1951-02-17  L TKA on 04-24-20 DCP:  Home with granddtr and dtr. 1 story home with 0 ste.  DME:  RW ordered through [facility] PT:  [place].  PT eval scheduled on 04-27-20.                    DME Arranged:  Gilford Rile rolling DME Agency:  [facility]  HH Arranged:  NA [place] Agency:  NA  Additional Comments: Please contact me with any questions of if this plan should need to change.  Marianne Sofia, RN,CCM [facility]  [phone] 04/25/2020, 8:34 AM

## 2020-04-25 NOTE — Progress Notes (Signed)
PT NOTE   04/25/20 1500  PT Visit Information  Last PT Received On 04/25/20  Exercise focused session this pm. Pt continues to be lethargic, falling asleep during HEP. Pt reports to PT that "you can't count" however discussed with pt that she was falling asleep even with attempts to count aloud. When pt is awake she is able to  [facility], reports her grand-dtr will be assisting as much as needed.  Ok to d/c per PT--when ready from nursing standpoint. Pt states she is exhausted from lack of sleep for day  Assistance Needed +1  History of Present Illness pt s/p LTKA 04/24/2020 with hx of RTKA 10/2016 DM and obesity.  Subjective Data  Patient Stated Goal I need to get better to take care of my young grandson  Precautions  Precautions Knee;Fall  Required Braces or Orthoses Knee Immobilizer - Right  Knee Immobilizer - Right Discontinue once straight leg raise with < 10 degree lag  Pain Assessment  Pain Assessment 0-10  Pain Score 6  Pain Location L knee  Pain Descriptors / Indicators Burning;Aching;Sore  Pain Intervention(s) Limited activity within patient's tolerance;Monitored during session;Premedicated before session  Cognition  Arousal/Alertness Lethargic;Suspect due to medications  Behavior During Therapy [facility] for tasks assessed/performed  Overall Cognitive Status Within Functional Limits for tasks assessed  Total Joint Exercises  Ankle Circles/Pumps AROM;10 reps;Supine;Both  Quad Sets AROM;Left;10 reps;Supine  Heel Slides AAROM;Left;Supine;10 reps  Hip ABduction/ADduction AAROM;10 reps;Left  Straight Leg Raises AROM;Left;10 reps  Goniometric ROM grossly 5 to 65 degrees R flexion AAROM   PT - End of Session  Equipment Utilized During Treatment Left knee immobilizer  Activity Tolerance Patient limited by lethargy (tol exercises well )  Patient left in bed;with call bell/phone within reach;with bed alarm set   PT - Assessment/Plan  PT Plan Current plan remains appropriate  PT Visit  Diagnosis Other abnormalities of gait and mobility (R26.89)  PT Frequency (ACUTE ONLY) 7X/week  Follow Up Recommendations Follow surgeon's recommendation for DC plan and follow-up therapies  PT equipment Rolling walker with 5" wheels  AM-PAC PT "6 Clicks" Mobility Outcome Measure (Version 2)  Help needed turning from your back to your side while in a flat bed without using bedrails? 3  Help needed moving from lying on your back to sitting on the side of a flat bed without using bedrails? 3  Help needed moving to and from a bed to a chair (including a wheelchair)? 3  Help needed standing up from a chair using your arms (e.g., wheelchair or bedside chair)? 3  Help needed to walk in hospital room? 3  Help needed climbing 3-5 steps with a railing?  2  6 Click Score 17  Consider Recommendation of Discharge To: Home with [facility]  Acute Rehab PT Goals  PT Goal Formulation With patient  Time For Goal Achievement 05/01/20  Potential to Achieve Goals Good  PT Time Calculation  PT Start Time (ACUTE ONLY) 1420  PT Stop Time (ACUTE ONLY) 1434  PT Time Calculation (min) (ACUTE ONLY) 14 min  PT General Charges  $$ ACUTE PT VISIT 1 Visit  PT Treatments  $Therapeutic Exercise 8-22 mins

## 2020-04-25 NOTE — Progress Notes (Signed)
Physical Therapy Treatment Patient Details Name: Amy Reyes MRN: [id number] DOB: August 07, 1950 Today's Date: 04/25/2020    History of Present Illness pt s/p LTKA 04/24/2020 with hx of RTKA 10/2016 DM and obesity.    PT Comments    Pt extremely lethargic, arouses with multi-modal stim. amb in hallway and returned to bed at pt request. Exercises deferred d/t incr pain, will see for second session this pm to review HEP/knee ROM   Follow Up Recommendations  Follow surgeon's recommendation for DC plan and follow-up therapies     Equipment Recommendations  Rolling walker with 5" wheels    Recommendations for Other Services       Precautions / Restrictions Precautions Precautions: Knee;Fall Required Braces or Orthoses: Knee Immobilizer - Right Knee Immobilizer - Right: Discontinue once straight leg raise with < 10 degree lag    Mobility  Bed Mobility   Bed Mobility: Supine to Sit;Sit to Supine     Supine to sit: Min assist Sit to supine: Min assist   General bed mobility comments: incr time, assist with LLE   Transfers Overall transfer level: Needs assistance   Transfers: Sit to/from Stand Sit to Stand: Min guard;From elevated surface         General transfer comment: cues for RW safety and hand placement  Ambulation/Gait Ambulation/Gait assistance: Min guard Gait Distance (Feet): 45 Feet Assistive device: Rolling walker (2 wheeled) Gait Pattern/deviations: Step-to pattern;Decreased stance time - left     General Gait Details: cues for sequence, use of UEs to un-wt LLE and improve pain control    Stairs             Wheelchair Mobility    Modified Rankin (Stroke Patients Only)       Balance                                            Cognition Arousal/Alertness: Lethargic;Suspect due to medications Behavior During Therapy: [facility] for tasks assessed/performed Overall Cognitive Status: Within Functional Limits for tasks  assessed                                 General Comments: requesting IV meds      Exercises Total Joint Exercises Ankle Circles/Pumps: AROM;10 reps;Supine;Both    General Comments        Pertinent Vitals/Pain Pain Assessment: 0-10 Pain Score: 8  Pain Location: L knee Pain Descriptors / Indicators: Burning;Aching;Sore Pain Intervention(s): Limited activity within patient's tolerance;Monitored during session;Premedicated before session;Ice applied    Home Living                      Prior Function            PT Goals (current goals can now be found in the care plan section) Acute Rehab PT Goals Patient Stated Goal: I need to get better to take care of my young grandson PT Goal Formulation: With patient Time For Goal Achievement: 05/01/20 Potential to Achieve Goals: Good Progress towards PT goals: Progressing toward goals    Frequency    7X/week      PT Plan Current plan remains appropriate    Co-evaluation              AM-PAC PT "6 Clicks" Mobility   Outcome Measure  Help  needed turning from your back to your side while in a flat bed without using bedrails?: A Little Help needed moving from lying on your back to sitting on the side of a flat bed without using bedrails?: A Little Help needed moving to and from a bed to a chair (including a wheelchair)?: A Little Help needed standing up from a chair using your arms (e.g., wheelchair or bedside chair)?: A Little Help needed to walk in hospital room?: A Little Help needed climbing 3-5 steps with a railing? : A Lot 6 Click Score: 17    End of Session Equipment Utilized During Treatment: Gait belt;Left knee immobilizer Activity Tolerance: Patient limited by pain Patient left: in bed;with call bell/phone within reach;with bed alarm set   PT Visit Diagnosis: Other abnormalities of gait and mobility (R26.89)     Time: 6004-5997 PT Time Calculation (min) (ACUTE ONLY): 20  min  Charges:  $Gait Training: 8-22 mins                     Baxter Flattery, PT  Acute Rehab Dept ([place]) [phone] Pager [phone]  04/25/2020    [facility] 04/25/2020, 11:44 AM

## 2020-04-25 NOTE — Plan of Care (Signed)
  Problem: Education: Goal: Knowledge of General Education information will improve Description: Including pain rating scale, medication(s)/side effects and non-pharmacologic comfort measures Outcome: Progressing   Problem: Clinical Measurements: Goal: Ability to maintain clinical measurements within normal limits will improve Outcome: Progressing Goal: Will remain free from infection Outcome: Progressing Goal: Diagnostic test results will improve Outcome: Progressing Goal: Respiratory complications will improve Outcome: Progressing Goal: Cardiovascular complication will be avoided Outcome: Progressing   Problem: Coping: Goal: Level of anxiety will decrease Outcome: Progressing   Problem: Pain Managment: Goal: General experience of comfort will improve Outcome: Progressing   

## 2020-04-25 NOTE — Progress Notes (Addendum)
Subjective: 1 Day Post-Op Procedure(s) (LRB): TOTAL KNEE ARTHROPLASTY (Left) Patient reports pain as moderate.   Patient seen in rounds by Dr. Wynelle Link. Patient is well, and has had no acute complaints or problems other than pain in the left knee. Denies chest pain, SOB, or calf pain. No issues overnight. Foley catheter to be removed this AM. We will continue therapy today.  Objective: Vital signs in last 24 hours: Temp:  [96.5 F (35.8 C)-98.7 F (37.1 C)] 98.1 F (36.7 C) (10/12 0559) Pulse Rate:  [50-89] 64 (10/12 0559) Resp:  [12-21] 16 (10/12 0559) BP: (126-169)/(53-118) 126/61 (10/12 0559) SpO2:  [87 %-100 %] 100 % (10/12 0559) Weight:  [99.8 kg] 99.8 kg (10/11 0925)  Intake/Output from previous day:  Intake/Output Summary (Last 24 hours) at 04/25/2020 0659 Last data filed at 04/25/2020 0600 Gross per 24 hour  Intake 2727.72 ml  Output 1575 ml  Net 1152.72 ml     Intake/Output this shift: Total I/O In: 1314 [P.O.:340; I.V.:974] Out: 1250 [Urine:1250]  Labs: Recent Labs    04/25/20 0337  HGB 9.6*   Recent Labs    04/25/20 0337  WBC 11.5*  RBC 3.69*  HCT 31.5*  PLT 225   Recent Labs    04/25/20 0337  NA 139  K 5.4*  CL 103  CO2 24  BUN 15  CREATININE 1.12*  GLUCOSE 135*  CALCIUM 9.2   No results for input(s): LABPT, INR in the last 72 hours.  Exam: General - Patient is Alert and Oriented Extremity - Neurologically intact Neurovascular intact Sensation intact distally Dorsiflexion/Plantar flexion intact Dressing - dressing C/D/I Motor Function - intact, moving foot and toes well on exam.   Past Medical History:  Diagnosis Date  . Allergy    seasonal allergies  . Anemia    iron defiencey  . Arthritis    oa  . Bronchitis hx of  . Chronic kidney disease    ckd stage 2- 3 due to NSAIDS use no nephrologist  . Colon polyps   . Complication of anesthesia    slow to wake up one time, none recent  . Diabetes mellitus without  complication ([place])    borderline type 2 on metformin  . GERD (gastroesophageal reflux disease)    on meds  . History of carpal tunnel syndrome    years ago  . History of kidney stones   . History of rotator cuff tear    Bilateral  . Hypercholesteremia    on meds  . Hypertension    on meds  . Peptic ulcer   . PMB (postmenopausal bleeding)    last time middle of april 2021    Assessment/Plan: 1 Day Post-Op Procedure(s) (LRB): TOTAL KNEE ARTHROPLASTY (Left) Active Problems:   Primary osteoarthritis of right knee  Estimated body mass index is 35.51 kg/m as calculated from the following:   Height as of this encounter: 5\' 6"  (1.676 m).   Weight as of this encounter: 99.8 kg. Advance diet Up with therapy D/C IV fluids  Patient's anticipated LOS is less than 2 midnights, meeting these requirements: - Lives within 1 hour of care - Has a competent adult at home to recover with post-op recover - NO history of  - Chronic pain requiring opioids  - Diabetes  - Coronary Artery Disease  - Heart failure  - Heart attack  - Stroke  - DVT/VTE  - Cardiac arrhythmia  - Respiratory Failure/COPD  - Renal failure  - Anemia  -  Advanced Liver disease  DVT Prophylaxis - Xarelto Weight bearing as tolerated. Continue therapy.  Potassium 5.4, will recheck BMP to rule out hemolysis.   Plan is to go Home after hospital stay. Possible discharge later today if meeting goals with therapy and pain controlled with PO medications. Scheduled for OPPT at [facility]). Follow-up in the office October 26th.  The PDMP database was reviewed today (04/25/2020) prior to any opioid medications being prescribed to this patient.  ADDENDUM: Repeat potassium 4.4  Theresa Duty, PA-C Orthopedic Surgery [phone] 04/25/2020, 6:59 AM

## 2020-04-26 NOTE — Discharge Summary (Signed)
Physician Discharge Summary   Patient ID: Amy Reyes MRN: [id number] DOB/AGE: [birth year] 69 y.o.  Admit date: 04/24/2020 Discharge date: 04/25/2020  Primary Diagnosis: Osteoarthritis, left knee   Admission Diagnoses:  Past Medical History:  Diagnosis Date  . Allergy    seasonal allergies  . Anemia    iron defiencey  . Arthritis    oa  . Bronchitis hx of  . Chronic kidney disease    ckd stage 2- 3 due to NSAIDS use no nephrologist  . Colon polyps   . Complication of anesthesia    slow to wake up one time, none recent  . Diabetes mellitus without complication ([place])    borderline type 2 on metformin  . GERD (gastroesophageal reflux disease)    on meds  . History of carpal tunnel syndrome    years ago  . History of kidney stones   . History of rotator cuff tear    Bilateral  . Hypercholesteremia    on meds  . Hypertension    on meds  . Peptic ulcer   . PMB (postmenopausal bleeding)    last time middle of april 2021   Discharge Diagnoses:   Active Problems:   Primary osteoarthritis of right knee  Estimated body mass index is 35.51 kg/m as calculated from the following:   Height as of this encounter: 5\' 6"  (1.676 m).   Weight as of this encounter: 99.8 kg.  Procedure:  Procedure(s) (LRB): TOTAL KNEE ARTHROPLASTY (Left)   Consults: None  HPI: Amy Reyes is a 69 y.o. year old female with end stage OA of her left knee with progressively worsening pain and dysfunction. She has constant pain, with activity and at rest and significant functional deficits with difficulties even with ADLs. She has had extensive non-op management including analgesics, injections of cortisone and viscosupplements, and home exercise program, but remains in significant pain with significant dysfunction. Radiographs show bone on bone arthritis medial and patellofemoral. She presents now for left Total Knee Arthroplasty.    Laboratory Data: Admission on 04/24/2020, Discharged  on 04/25/2020  Component Date Value Ref Range Status  . Glucose-Capillary 04/24/2020 99  70 - 99 mg/dL Final   Glucose reference range applies only to samples taken after fasting for at least 8 hours.  . Glucose-Capillary 04/24/2020 94  70 - 99 mg/dL Final   Glucose reference range applies only to samples taken after fasting for at least 8 hours.  . WBC 04/25/2020 11.5* 4.0 - 10.5 K/uL Final  . RBC 04/25/2020 3.69* 3.87 - 5.11 MIL/uL Final  . Hemoglobin 04/25/2020 9.6* 12.0 - 15.0 g/dL Final  . HCT 04/25/2020 31.5* 36 - 46 % Final  . MCV 04/25/2020 85.4  80.0 - 100.0 fL Final  . MCH 04/25/2020 26.0  26.0 - 34.0 pg Final  . MCHC 04/25/2020 30.5  30.0 - 36.0 g/dL Final  . RDW 04/25/2020 15.7* 11.5 - 15.5 % Final  . Platelets 04/25/2020 225  150 - 400 K/uL Final  . nRBC 04/25/2020 0.0  0.0 - 0.2 % Final   Performed at [facility], [place] [address] [place], [place] [address]  . Sodium 04/25/2020 139  135 - 145 mmol/L Final  . Potassium 04/25/2020 5.4* 3.5 - 5.1 mmol/L Final  . Chloride 04/25/2020 103  98 - 111 mmol/L Final  . CO2 04/25/2020 24  22 - 32 mmol/L Final  . Glucose, Bld 04/25/2020 135* 70 - 99 mg/dL Final   Glucose reference range applies  only to samples taken after fasting for at least 8 hours.  . BUN 04/25/2020 15  8 - 23 mg/dL Final  . Creatinine, Ser 04/25/2020 1.12* 0.44 - 1.00 mg/dL Final  . Calcium 04/25/2020 9.2  8.9 - 10.3 mg/dL Final  . GFR, Estimated 04/25/2020 50* >60 mL/min Final  . Anion gap 04/25/2020 12  5 - 15 Final   Performed at [facility], [place] [address] [place], [place] [address]  . Sodium 04/25/2020 140  135 - 145 mmol/L Final  . Potassium 04/25/2020 4.3  3.5 - 5.1 mmol/L Final   DELTA CHECK NOTED  . Chloride 04/25/2020 103  98 - 111 mmol/L Final  . CO2 04/25/2020 24  22 - 32 mmol/L Final  . Glucose, Bld 04/25/2020 154* 70 - 99 mg/dL Final   Glucose reference range applies only to samples taken after fasting for  at least 8 hours.  . BUN 04/25/2020 15  8 - 23 mg/dL Final  . Creatinine, Ser 04/25/2020 1.04* 0.44 - 1.00 mg/dL Final  . Calcium 04/25/2020 9.2  8.9 - 10.3 mg/dL Final  . GFR, Estimated 04/25/2020 55* >60 mL/min Final  . Anion gap 04/25/2020 13  5 - 15 Final   Performed at [facility], [place] [address], [place], [place] [address]  Hospital Outpatient Visit on 04/20/2020  Component Date Value Ref Range Status  . SARS Coronavirus 2 04/20/2020 NEGATIVE  NEGATIVE Final   Comment: (NOTE) SARS-CoV-2 target nucleic acids are NOT DETECTED.  The SARS-CoV-2 RNA is generally detectable in upper and lower respiratory specimens during the acute phase of infection. Negative results do not preclude SARS-CoV-2 infection, do not rule out co-infections with other pathogens, and should not be used as the sole basis for treatment or other patient management decisions. Negative results must be combined with clinical observations, patient history, and epidemiological information. The expected result is Negative.  Fact Sheet for Patients: SugarRoll.be  Fact Sheet for Healthcare Providers: https://www.woods-mathews.com/  This test is not yet approved or cleared by the [place] FDA and  has been authorized for detection and/or diagnosis of SARS-CoV-2 by FDA under an Emergency Use Authorization (EUA). This EUA will remain  in effect (meaning this test can be used) for the duration of the COVID-19 declaration under Se                          ction 564(b)(1) of the Act, 21 U.S.C. section 360bbb-3(b)(1), unless the authorization is terminated or revoked sooner.  Performed at [facility], [place] [address] [place], [place] [address]   Hospital Outpatient Visit on 04/13/2020  Component Date Value Ref Range Status  . MRSA, PCR 04/13/2020 NEGATIVE  NEGATIVE Final  . Staphylococcus aureus 04/13/2020 NEGATIVE  NEGATIVE Final   Comment:  (NOTE) The Xpert SA Assay (FDA approved for NASAL specimens in patients 69 years of age and older), is one component of a comprehensive surveillance program. It is not intended to diagnose infection nor to guide or monitor treatment. Performed at [facility], [place] [address], [place], [place] [address]   . aPTT 04/13/2020 31  24 - 36 seconds Final   Performed at [facility], [place] [address] [place], [place] [address]  . WBC 04/13/2020 6.9  4.0 - 10.5 K/uL Final  . RBC 04/13/2020 4.04  3.87 - 5.11 MIL/uL Final  . Hemoglobin 04/13/2020 10.7* 12.0 - 15.0 g/dL Final  .  HCT 04/13/2020 34.1* 36 - 46 % Final  . MCV 04/13/2020 84.4  80.0 - 100.0 fL Final  . MCH 04/13/2020 26.5  26.0 - 34.0 pg Final  . MCHC 04/13/2020 31.4  30.0 - 36.0 g/dL Final  . RDW 04/13/2020 16.0* 11.5 - 15.5 % Final  . Platelets 04/13/2020 246  150 - 400 K/uL Final  . nRBC 04/13/2020 0.0  0.0 - 0.2 % Final   Performed at [facility], [place] [address] [place], [place] [address]  . Sodium 04/13/2020 139  135 - 145 mmol/L Final  . Potassium 04/13/2020 4.4  3.5 - 5.1 mmol/L Final  . Chloride 04/13/2020 104  98 - 111 mmol/L Final  . CO2 04/13/2020 25  22 - 32 mmol/L Final  . Glucose, Bld 04/13/2020 93  70 - 99 mg/dL Final   Glucose reference range applies only to samples taken after fasting for at least 8 hours.  . BUN 04/13/2020 13  8 - 23 mg/dL Final  . Creatinine, Ser 04/13/2020 1.08* 0.44 - 1.00 mg/dL Final  . Calcium 04/13/2020 9.5  8.9 - 10.3 mg/dL Final  . Total Protein 04/13/2020 7.6  6.5 - 8.1 g/dL Final  . Albumin 04/13/2020 4.4  3.5 - 5.0 g/dL Final  . AST 04/13/2020 17  15 - 41 U/L Final  . ALT 04/13/2020 17  0 - 44 U/L Final  . Alkaline Phosphatase 04/13/2020 80  38 - 126 U/L Final  . Total Bilirubin 04/13/2020 1.3* 0.3 - 1.2 mg/dL Final  . GFR calc non Af Amer 04/13/2020 52* >60 mL/min Final  . GFR calc Af Amer 04/13/2020 >60  >60 mL/min Final  .  Anion gap 04/13/2020 10  5 - 15 Final   Performed at [facility], [place] [address] [place], [place] [address]  . Prothrombin Time 04/13/2020 12.4  11.4 - 15.2 seconds Final  . INR 04/13/2020 1.0  0.8 - 1.2 Final   Comment: (NOTE) INR goal varies based on device and disease states. Performed at [facility], [place] [address] [place], [place] [address]   . ABO/RH(D) 04/13/2020 A POS   Final  . Antibody Screen 04/13/2020 NEG   Final  . Sample Expiration 04/13/2020 04/27/2020,2359   Final  . Extend sample reason 04/13/2020    Final                   Value:NO TRANSFUSIONS OR PREGNANCY IN THE PAST 3 MONTHS Performed at [place] [address], [place], [place] [address]   . Hgb A1c MFr Bld 04/13/2020 6.7* 4.8 - 5.6 % Final   Comment: (NOTE) Pre diabetes:          5.7%-6.4%  Diabetes:              >6.4%  Glycemic control for   <7.0% adults with diabetes   . Mean Plasma Glucose 04/13/2020 145.59  mg/dL Final   Performed at [place] [address], [place], [place] [address]  . Glucose-Capillary 04/13/2020 94  70 - 99 mg/dL Final   Glucose reference range applies only to samples taken after fasting for at least 8 hours.     X-Rays:No results found.  EKG: Orders placed or performed during the hospital encounter of 12/02/19  . EKG 12 lead  . EKG 12 lead     Hospital Course: Amy Reyes is a 69 y.o. who was admitted to [facility]. They were  brought to the operating room on 04/24/2020 and underwent Procedure(s): TOTAL KNEE ARTHROPLASTY.  Patient tolerated the procedure well and was later transferred to the recovery room and then to the orthopaedic floor for postoperative care. They were given PO and IV analgesics for pain control following their surgery. They were given 24 hours of postoperative antibiotics of  Anti-infectives (From admission, onward)   Start     Dose/Rate Route Frequency Ordered  Stop   04/24/20 1800  ceFAZolin (ANCEF) IVPB 2g/100 mL premix        2 g 200 mL/hr over 30 Minutes Intravenous Every 6 hours 04/24/20 1502 04/25/20 0853   04/24/20 0900  ceFAZolin (ANCEF) IVPB 2g/100 mL premix        2 g 200 mL/hr over 30 Minutes Intravenous On call to O.R. 04/24/20 0973 04/24/20 1135     and started on DVT prophylaxis in the form of Xarelto.   PT and OT were ordered for total joint protocol. Discharge planning consulted to help with postop disposition and equipment needs.  Patient had a good night on the evening of surgery. They started to get up OOB with therapy on POD #0. Pt was seen during rounds and was ready to go home pending progress with therapy. She worked with therapy on POD #1 and was meeting her goals. Pt was discharged to home later that day in stable condition.  Diet: Regular diet Activity: WBAT Follow-up: in 2 weeks Disposition: Home with OPPT at [facility] Linna Hoff) Discharged Condition: stable   Discharge Instructions    Call MD / Call 911   Complete by: As directed    If you experience chest pain or shortness of breath, CALL 911 and be transported to the hospital emergency room.  If you develope a fever above 101 F, pus (white drainage) or increased drainage or redness at the wound, or calf pain, call your surgeon's office.   Change dressing   Complete by: As directed    You may remove the bulky bandage (ACE wrap and gauze) two days after surgery. You will have an adhesive waterproof bandage underneath. Leave this in place until your first follow-up appointment.   Constipation Prevention   Complete by: As directed    Drink plenty of fluids.  Prune juice may be helpful.  You may use a stool softener, such as Colace (over the counter) 100 mg twice a day.  Use MiraLax (over the counter) for constipation as needed.   Diet - low sodium heart healthy   Complete by: As directed    Do not put a pillow under the knee. Place it under the heel.   Complete by: As  directed    Driving restrictions   Complete by: As directed    No driving for two weeks   TED hose   Complete by: As directed    Use stockings (TED hose) for three weeks on both leg(s).  You may remove them at night for sleeping.   Weight bearing as tolerated   Complete by: As directed      Allergies as of 04/25/2020      Reactions   Tramadol Itching   Codeine    Does not like the side effects-post taking      Medication List    STOP taking these medications   KP Vitamin D3 50 MCG (2000 UT) Caps Generic drug: Cholecalciferol   multivitamin with minerals Tabs tablet   vitamin B-12 1000 MCG tablet Commonly known as: CYANOCOBALAMIN  VITAMIN C PO     TAKE these medications   atorvastatin 80 MG tablet Commonly known as: LIPITOR Take 80 mg by mouth daily.   cyclobenzaprine 10 MG tablet Commonly known as: FLEXERIL Take 1 tablet (10 mg total) by mouth 3 (three) times daily as needed for muscle spasms.   ferrous sulfate 325 (65 FE) MG tablet Take 325 mg by mouth daily with breakfast.   gabapentin 300 MG capsule Commonly known as: NEURONTIN Take a 300 mg capsule three times a day for two weeks following surgery.Then take a 300 mg capsule two times a day for two weeks. Then take a 300 mg capsule once a day for two weeks. Then discontinue.   losartan-hydrochlorothiazide 100-25 MG tablet Commonly known as: HYZAAR Take 1 tablet by mouth daily.   metFORMIN 500 MG tablet Commonly known as: GLUCOPHAGE Take 500 mg by mouth daily.   omeprazole 40 MG capsule Commonly known as: PRILOSEC TAKE 1 CAPSULE BY MOUTH TWICE A DAY What changed: when to take this   oxyCODONE 5 MG immediate release tablet Commonly known as: Oxy IR/ROXICODONE Take 1-2 tablets (5-10 mg total) by mouth every 6 (six) hours as needed for moderate pain or severe pain.   promethazine 12.5 MG tablet Commonly known as: PHENERGAN Take 1 tablet (12.5 mg total) by mouth every 6 (six) hours as needed for  nausea or vomiting.   rivaroxaban 10 MG Tabs tablet Commonly known as: XARELTO Take 1 tablet (10 mg total) by mouth daily with breakfast for 20 days. Then take one 81 mg aspirin once a day for three weeks. Then discontinue aspirin.            Discharge Care Instructions  (From admission, onward)         Start     Ordered   04/25/20 0000  Weight bearing as tolerated        04/25/20 0706   04/25/20 0000  Change dressing       Comments: You may remove the bulky bandage (ACE wrap and gauze) two days after surgery. You will have an adhesive waterproof bandage underneath. Leave this in place until your first follow-up appointment.   04/25/20 0706          Follow-up Information    Gaynelle Arabian, MD. Schedule an appointment as soon as possible for a visit on 05/09/2020.   Specialty: Orthopedic Surgery Contact information: [address] [place] [address] [phone]               Signed: Theresa Duty, PA-C Orthopedic Surgery 04/26/2020, 9:02 AM

## 2020-04-27 ENCOUNTER — Telehealth (HOSPITAL_COMMUNITY): Payer: Self-pay | Admitting: Physical Therapy

## 2020-04-27 ENCOUNTER — Encounter (HOSPITAL_COMMUNITY): Payer: Self-pay

## 2020-04-27 ENCOUNTER — Ambulatory Visit (HOSPITAL_COMMUNITY): Payer: Medicare HMO | Admitting: Physical Therapy

## 2020-04-27 NOTE — Telephone Encounter (Signed)
pt called to cx today's appt due to she is not feeling well 

## 2020-04-30 ENCOUNTER — Other Ambulatory Visit: Payer: Self-pay

## 2020-04-30 ENCOUNTER — Encounter (HOSPITAL_COMMUNITY): Payer: Self-pay | Admitting: Emergency Medicine

## 2020-04-30 ENCOUNTER — Emergency Department (HOSPITAL_COMMUNITY): Payer: Medicare HMO

## 2020-04-30 ENCOUNTER — Emergency Department (HOSPITAL_COMMUNITY)
Admission: EM | Admit: 2020-04-30 | Discharge: 2020-04-30 | Disposition: A | Discharge status: Home / Self Care | Payer: Medicare HMO | Attending: Emergency Medicine | Admitting: Emergency Medicine

## 2020-04-30 DIAGNOSIS — G8918 Other acute postprocedural pain: Secondary | ICD-10-CM | POA: Diagnosis not present

## 2020-04-30 DIAGNOSIS — T45511A Poisoning by anticoagulants, accidental (unintentional), initial encounter: Secondary | ICD-10-CM | POA: Insufficient documentation

## 2020-04-30 DIAGNOSIS — E119 Type 2 diabetes mellitus without complications: Secondary | ICD-10-CM | POA: Diagnosis not present

## 2020-04-30 DIAGNOSIS — M7989 Other specified soft tissue disorders: Secondary | ICD-10-CM | POA: Diagnosis not present

## 2020-04-30 DIAGNOSIS — Z7984 Long term (current) use of oral hypoglycemic drugs: Secondary | ICD-10-CM | POA: Insufficient documentation

## 2020-04-30 DIAGNOSIS — Z87891 Personal history of nicotine dependence: Secondary | ICD-10-CM | POA: Diagnosis not present

## 2020-04-30 DIAGNOSIS — Z7901 Long term (current) use of anticoagulants: Secondary | ICD-10-CM | POA: Diagnosis not present

## 2020-04-30 DIAGNOSIS — I10 Essential (primary) hypertension: Secondary | ICD-10-CM | POA: Insufficient documentation

## 2020-04-30 DIAGNOSIS — Z79899 Other long term (current) drug therapy: Secondary | ICD-10-CM | POA: Insufficient documentation

## 2020-04-30 DIAGNOSIS — Z96652 Presence of left artificial knee joint: Secondary | ICD-10-CM | POA: Diagnosis not present

## 2020-04-30 DIAGNOSIS — T50901A Poisoning by unspecified drugs, medicaments and biological substances, accidental (unintentional), initial encounter: Secondary | ICD-10-CM

## 2020-04-30 DIAGNOSIS — Z471 Aftercare following joint replacement surgery: Secondary | ICD-10-CM | POA: Diagnosis not present

## 2020-04-30 LAB — COMPREHENSIVE METABOLIC PANEL
ALT: 14 U/L (ref 0–44)
AST: 17 U/L (ref 15–41)
Albumin: 3.6 g/dL (ref 3.5–5.0)
Alkaline Phosphatase: 74 U/L (ref 38–126)
Anion gap: 17 — ABNORMAL HIGH (ref 5–15)
BUN: 23 mg/dL (ref 8–23)
CO2: 26 mmol/L (ref 22–32)
Calcium: 9.7 mg/dL (ref 8.9–10.3)
Chloride: 94 mmol/L — ABNORMAL LOW (ref 98–111)
Creatinine, Ser: 1.13 mg/dL — ABNORMAL HIGH (ref 0.44–1.00)
GFR, Estimated: 50 mL/min — ABNORMAL LOW (ref >60)
Glucose, Bld: 110 mg/dL — ABNORMAL HIGH (ref 70–99)
Potassium: 3.5 mmol/L (ref 3.5–5.1)
Sodium: 137 mmol/L (ref 135–145)
Total Bilirubin: 1.3 mg/dL — ABNORMAL HIGH (ref 0.3–1.2)
Total Protein: 8 g/dL (ref 6.5–8.1)

## 2020-04-30 LAB — CBC WITH DIFFERENTIAL/PLATELET
Abs Immature Granulocytes: 0.05 10*3/uL (ref 0.00–0.07)
Basophils Absolute: 0 10*3/uL (ref 0.0–0.1)
Basophils Relative: 0 %
Eosinophils Absolute: 0.4 10*3/uL (ref 0.0–0.5)
Eosinophils Relative: 4 %
HCT: 27.8 % — ABNORMAL LOW (ref 36.0–46.0)
Hemoglobin: 8.5 g/dL — ABNORMAL LOW (ref 12.0–15.0)
Immature Granulocytes: 0 %
Lymphocytes Relative: 14 %
Lymphs Abs: 1.5 10*3/uL (ref 0.7–4.0)
MCH: 26.2 pg (ref 26.0–34.0)
MCHC: 30.6 g/dL (ref 30.0–36.0)
MCV: 85.5 fL (ref 80.0–100.0)
Monocytes Absolute: 0.6 10*3/uL (ref 0.1–1.0)
Monocytes Relative: 5 %
Neutro Abs: 8.6 10*3/uL — ABNORMAL HIGH (ref 1.7–7.7)
Neutrophils Relative %: 77 %
Platelets: 307 10*3/uL (ref 150–400)
RBC: 3.25 MIL/uL — ABNORMAL LOW (ref 3.87–5.11)
RDW: 15.8 % — ABNORMAL HIGH (ref 11.5–15.5)
WBC: 11.2 10*3/uL — ABNORMAL HIGH (ref 4.0–10.5)
nRBC: 0 % (ref 0.0–0.2)

## 2020-04-30 LAB — LACTIC ACID, PLASMA: Lactic Acid, Venous: 1.5 mmol/L (ref 0.5–1.9)

## 2020-04-30 LAB — URINALYSIS, ROUTINE W REFLEX MICROSCOPIC
Bilirubin Urine: NEGATIVE
Glucose, UA: NEGATIVE mg/dL
Hgb urine dipstick: NEGATIVE
Ketones, ur: NEGATIVE mg/dL
Leukocytes,Ua: NEGATIVE
Nitrite: NEGATIVE
Protein, ur: NEGATIVE mg/dL
Specific Gravity, Urine: 1.014 (ref 1.005–1.030)
pH: 6 (ref 5.0–8.0)

## 2020-04-30 LAB — C-REACTIVE PROTEIN: CRP: 17.4 mg/dL — ABNORMAL HIGH (ref <1.0)

## 2020-04-30 LAB — SEDIMENTATION RATE: Sed Rate: 120 mm/hr — ABNORMAL HIGH (ref 0–22)

## 2020-04-30 MED ORDER — CEFAZOLIN SODIUM-DEXTROSE 2-4 GM/100ML-% IV SOLN
2.0000 g | Freq: Once | INTRAVENOUS | Status: AC
  Administered 2020-04-30: 2 g via INTRAVENOUS
  Filled 2020-04-30: qty 100

## 2020-04-30 NOTE — ED Notes (Signed)
RN to room to aid in d/c of patient. Upon entry to room pt was standing and upset that she "was just placed in this room, I have been yelling for a long time." Informed staff was just in room recently to hang antibiotic. Pt also educated that the ER is very loud and we cannot hear yelling. To use the call bell that was looped to the bed rail. Pt upset with stay. Apologies. Vs not taken.  Pt wheeled out to lobby to assist in car.
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
## 2020-04-30 NOTE — Discharge Instructions (Addendum)
You have been treated today with a strong antibiotic to help treat your knee in the event there is brewing infection around your incision.  You have also been prescribed Keflex, start taking this medicine tomorrow morning.  Call Dr. Wynelle Link office tomorrow for an appointment to see him on Tuesday to reassess your knee.  In the interim you may use ice as much as is comfortable to help with pain and swelling of the knee.  Do not take any blood thinning medications including Xarelto or aspirin since you have taken too much of your Xarelto.  Get reevaluated for any unusual bruising or bleeding, otherwise expect that this Xarelto overdosing will dissipate fairly quickly.  Avoid any activity which puts you at increased risk of falls.

## 2020-04-30 NOTE — ED Notes (Signed)
Assisted pt to the restroom. Pt was able to ambulate with assist. Dressing removed.
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
## 2020-04-30 NOTE — ED Provider Notes (Signed)
[facility] EMERGENCY DEPARTMENT Provider Note   CSN: [id number] Arrival date & time: 04/30/20  1507     History Chief Complaint  Patient presents with  . Wound Check    Emer Amy Reyes is a 69 y.o. female with a history as outlined below, significant for being 16 days out from a left total knee replacement presenting for evaluation of increasing pain redness and swelling around the surgery site.  She does endorse that a nephew accidentally hit her knee several days ago, but does not feel that this is the reason for her symptoms as she was having significant symptoms prior to this event.  Her daughter at the bedside also is concerned about her overuse of the Xarelto.  She was prescribed this medication to take 1 tablet a daily for 20 days and then switch to aspirin.  The patient was confused and instead was taking this medication 4 times a day and has taken her last dose today.  She has had no bleeding from the wound or any other sites.  She does have significant bruising around the surgery site, particularly lateral to the knee.  She has had no fevers or chills, daughter states she has been confused since coming home from this hospitalization.   HPI     Past Medical History:  Diagnosis Date  . Allergy    seasonal allergies  . Anemia    iron defiencey  . Arthritis    oa  . Bronchitis hx of  . Chronic kidney disease    ckd stage 2- 3 due to NSAIDS use no nephrologist  . Colon polyps   . Complication of anesthesia    slow to wake up one time, none recent  . Diabetes mellitus without complication ([place])    borderline type 2 on metformin  . GERD (gastroesophageal reflux disease)    on meds  . History of carpal tunnel syndrome    years ago  . History of kidney stones   . History of rotator cuff tear    Bilateral  . Hypercholesteremia    on meds  . Hypertension    on meds  . Peptic ulcer   . PMB (postmenopausal bleeding)    last time middle of april 2021    Patient  Active Problem List   Diagnosis Date Noted  . Primary osteoarthritis of right knee 04/24/2020  . Endometrial polyp 03/19/2020  . Fibroids, intramural 11/12/2019  . Hypertension   . Hypercholesteremia   . History of kidney stones   . Diabetes mellitus without complication ([place])   . Colon polyps   . OA (osteoarthritis) of knee 10/14/2016    Past Surgical History:  Procedure Laterality Date  . ABDOMINOPLASTY  30 yrs ago   tummy tuck  . BREAST SURGERY     reduction  . CHOLECYSTECTOMY  2000   laparoscopic  . COLONOSCOPY  2019   TA  . DILATATION & CURETTAGE/HYSTEROSCOPY WITH MYOSURE N/A 12/20/2019   Procedure: DILATATION & CURETTAGE/HYSTEROSCOPY WITH MYOSURE RESECTION OF POLYP;  Surgeon: Megan Salon, MD;  Location: [place];  Service: Gynecology;  Laterality: N/A;  resection of polyp,  to follow first case  . INJECTION KNEE Left 10/14/2016   Procedure: CORTISONE LEFT KNEE INJECTION;  Surgeon: Gaynelle Arabian, MD;  Location: [facility];  Service: Orthopedics;  Laterality: Left;  . LAPAROSCOPIC GASTRIC BANDING  2010 or 2011  . left knee injection  6 weeks ago  . POLYPECTOMY     TA  .  ROTATOR CUFF REPAIR Left 05/2019  . TOTAL KNEE ARTHROPLASTY Right 10/14/2016   Procedure: RIGHT TOTAL KNEE ARTHROPLASTY;  Surgeon: Gaynelle Arabian, MD;  Location: [facility];  Service: Orthopedics;  Laterality: Right;  Adductor Block  . TOTAL KNEE ARTHROPLASTY Left 04/24/2020   Procedure: TOTAL KNEE ARTHROPLASTY;  Surgeon: Gaynelle Arabian, MD;  Location: [facility];  Service: Orthopedics;  Laterality: Left;  46min     OB History    Gravida  3   Para      Term      Preterm      AB  1   Living  2     SAB      TAB  1   Ectopic      Multiple      Live Births              Family History  Problem Relation Age of Onset  . Hypertension Mother   . Thyroid disease Mother   . Kidney disease Sister   . Hypertension Sister   . Diabetes Maternal Grandfather   . Diabetes Maternal Aunt     . Diabetes Maternal Uncle   . Prostate cancer Father 34  . Colon cancer Father 69  . Colon polyps Father 23  . Hypertension Brother   . Uterine cancer Maternal Aunt   . Rectal cancer Neg Hx   . Stomach cancer Neg Hx     Social History   Tobacco Use  . Smoking status: Former Smoker    Years: 1.00    Types: Cigarettes    Quit date: 05/16/1983    Years since quitting: 36.9  . Smokeless tobacco: Never Used  Vaping Use  . Vaping Use: Never used  Substance Use Topics  . Alcohol use: Yes    Alcohol/week: 2.0 standard drinks    Types: 2 Glasses of wine per week  . Drug use: No    Home Medications Prior to Admission medications   Medication Sig Start Date End Date Taking? Authorizing Provider  atorvastatin (LIPITOR) 80 MG tablet Take 80 mg by mouth daily.  08/23/19   [provider]  cyclobenzaprine (FLEXERIL) 10 MG tablet Take 1 tablet (10 mg total) by mouth 3 (three) times daily as needed for muscle spasms. 04/25/20   Edmisten, Ok Anis, PA  ferrous sulfate 325 (65 FE) MG tablet Take 325 mg by mouth daily with breakfast.    [provider]  gabapentin (NEURONTIN) 300 MG capsule Take a 300 mg capsule three times a day for two weeks following surgery.Then take a 300 mg capsule two times a day for two weeks. Then take a 300 mg capsule once a day for two weeks. Then discontinue. 04/25/20   Edmisten, Kristie L, PA  losartan-hydrochlorothiazide (HYZAAR) 100-25 MG tablet Take 1 tablet by mouth daily. 09/08/16   [provider]  metFORMIN (GLUCOPHAGE) 500 MG tablet Take 500 mg by mouth daily. 09/09/16   [provider]  omeprazole (PRILOSEC) 40 MG capsule TAKE 1 CAPSULE BY MOUTH TWICE A DAY Patient taking differently: Take 40 mg by mouth daily.  05/28/19   Thornton Park, MD  oxyCODONE (OXY IR/ROXICODONE) 5 MG immediate release tablet Take 1-2 tablets (5-10 mg total) by mouth every 6 (six) hours as needed for moderate pain or severe pain. 04/25/20    Edmisten, Ok Anis, PA  promethazine (PHENERGAN) 12.5 MG tablet Take 1 tablet (12.5 mg total) by mouth every 6 (six) hours as needed for nausea or vomiting. 04/25/20  Edmisten, Kristie L, PA  rivaroxaban (XARELTO) 10 MG TABS tablet Take 1 tablet (10 mg total) by mouth daily with breakfast for 20 days. Then take one 81 mg aspirin once a day for three weeks. Then discontinue aspirin. 04/25/20 05/15/20  Edmisten, Ok Anis, PA    Allergies    Tramadol and Codeine  Review of Systems   Review of Systems  Physical Exam Updated Vital Signs BP (!) 175/69   Pulse 98   Temp 99.3 F (37.4 C) (Oral)   Resp 18   Ht 5\' 6"  (1.676 m)   Wt 99.8 kg   SpO2 98%   BMI 35.51 kg/m   Physical Exam Constitutional:      Appearance: She is well-developed.  HENT:     Head: Atraumatic.  Cardiovascular:     Comments: Pulses equal bilaterally Musculoskeletal:        General: Swelling and tenderness present.     Cervical back: Normal range of motion.     Left lower leg: Edema present.  Skin:    General: Skin is warm and dry.     Findings: Erythema present.  Neurological:     Mental Status: She is alert.     Sensory: No sensory deficit.     Deep Tendon Reflexes: Reflexes normal.       ED Results / Procedures / Treatments   Labs (all labs ordered are listed, but only abnormal results are displayed) Labs Reviewed  CBC WITH DIFFERENTIAL/PLATELET - Abnormal; Notable for the following components:      Result Value   WBC 11.2 (*)    RBC 3.25 (*)    Hemoglobin 8.5 (*)    HCT 27.8 (*)    RDW 15.8 (*)    Neutro Abs 8.6 (*)    All other components within normal limits  COMPREHENSIVE METABOLIC PANEL - Abnormal; Notable for the following components:   Chloride 94 (*)    Glucose, Bld 110 (*)    Creatinine, Ser 1.13 (*)    Total Bilirubin 1.3 (*)    GFR, Estimated 50 (*)    Anion gap 17 (*)    All other components within normal limits  URINALYSIS, ROUTINE W REFLEX MICROSCOPIC - Abnormal;  Notable for the following components:   APPearance HAZY (*)    All other components within normal limits  SEDIMENTATION RATE - Abnormal; Notable for the following components:   Sed Rate 120 (*)    All other components within normal limits  C-REACTIVE PROTEIN - Abnormal; Notable for the following components:   CRP 17.4 (*)    All other components within normal limits  LACTIC ACID, PLASMA    EKG None  Radiology DG Knee 2 Views Left  Result Date: 04/30/2020 CLINICAL DATA:  Postop pain, swelling EXAM: LEFT KNEE - 1-2 VIEW COMPARISON:  03/01/2018 FINDINGS: Changes of left knee replacement. No hardware complicating feature or acute bony abnormality. IMPRESSION: Left knee replacement.  No visible complicating feature. Electronically Signed   By: Rolm Baptise M.D.   On: 04/30/2020 20:31    Procedures Procedures (including critical care time)  Medications Ordered in ED Medications  ceFAZolin (ANCEF) IVPB 2g/100 mL premix (0 g Intravenous Stopped 04/30/20 2244)    ED Course  I have reviewed the triage vital signs and the nursing notes.  Pertinent labs & imaging results that were available during my care of the patient were reviewed by me and considered in my medical decision making (see chart for details).  MDM Rules/Calculators/A&P                          Patient with postoperative pain, continued edema and accidental overdose of her Xarelto.  She has no signs of active bleeding.  She does have moderate bruising around the left knee.  She also has erythema and increased warmth.  I discussed these findings with Dr. Alvan Dame who is on-call for Dr. Wynelle Link.  Suspects this may be simple normal postoperative findings, however cannot rule out the possibility of an increased blood collection/hematoma within the knee space given her overuse of her Xarelto.  She has no other signs or history of bleeding.  She was advised to avoid any anticoagulants including aspirin or Xarelto (although unable to  take as she has run out of this).  Also advised coverage for possible postoperative infection.  She was given 2 g of Ancef here and sent home with Keflex 500 mg 4 times daily per Dr. Aurea Graff recommendation.  He also advised for her to follow-up in their office on Tuesday with Dr. Wynelle Link.  She and her daughter understand and agree with this plan.  They will call tomorrow for an appointment time on Tuesday. Final Clinical Impression(s) / ED Diagnoses Final diagnoses:  Post-operative pain  Medication overdose, accidental or unintentional, initial encounter    Rx / DC Orders ED Discharge Orders    None       Landis Martins 05/01/20 1316    Sherwood Gambler, MD 05/01/20 1545

## 2020-05-01 ENCOUNTER — Encounter: Payer: Medicare HMO | Admitting: Gastroenterology

## 2020-05-01 ENCOUNTER — Ambulatory Visit (HOSPITAL_COMMUNITY): Payer: Medicare HMO | Attending: Student | Admitting: Physical Therapy

## 2020-05-01 ENCOUNTER — Encounter (HOSPITAL_COMMUNITY): Payer: Self-pay | Admitting: Physical Therapy

## 2020-05-01 DIAGNOSIS — R2689 Other abnormalities of gait and mobility: Secondary | ICD-10-CM | POA: Diagnosis not present

## 2020-05-01 DIAGNOSIS — M25662 Stiffness of left knee, not elsewhere classified: Secondary | ICD-10-CM | POA: Insufficient documentation

## 2020-05-01 DIAGNOSIS — M25562 Pain in left knee: Secondary | ICD-10-CM | POA: Insufficient documentation

## 2020-05-01 NOTE — Therapy (Signed)
[place], [place], [address] Phone: [phone]   Fax:  [phone]  Physical Therapy Evaluation  Patient Details  Name: Amy Reyes MRN: [id number] Date of Birth: [birth year] Referring Provider (PT): Theresa Duty Pa-C    Encounter Date: 05/01/2020   PT End of Session - 05/01/20 1637    Visit Number 1    Number of Visits 18    Date for PT Re-Evaluation 06/16/20    Authorization Type [employer]    Authorization Time Period Check auth    Authorization - Visit Number 1    Authorization - Number of Visits 1    PT Start Time 1027    PT Stop Time 2536    PT Time Calculation (min) 50 min    Activity Tolerance Patient limited by pain;Patient limited by fatigue    Behavior During Therapy [facility] for tasks assessed/performed           Past Medical History:  Diagnosis Date  . Allergy    seasonal allergies  . Anemia    iron defiencey  . Arthritis    oa  . Bronchitis hx of  . Chronic kidney disease    ckd stage 2- 3 due to NSAIDS use no nephrologist  . Colon polyps   . Complication of anesthesia    slow to wake up one time, none recent  . Diabetes mellitus without complication ([place])    borderline type 2 on metformin  . GERD (gastroesophageal reflux disease)    on meds  . History of carpal tunnel syndrome    years ago  . History of kidney stones   . History of rotator cuff tear    Bilateral  . Hypercholesteremia    on meds  . Hypertension    on meds  . Peptic ulcer   . PMB (postmenopausal bleeding)    last time middle of april 2021    Past Surgical History:  Procedure Laterality Date  . ABDOMINOPLASTY  30 yrs ago   tummy tuck  . BREAST SURGERY     reduction  . CHOLECYSTECTOMY  2000   laparoscopic  . COLONOSCOPY  2019   TA  . DILATATION & CURETTAGE/HYSTEROSCOPY WITH MYOSURE N/A 12/20/2019   Procedure: DILATATION & CURETTAGE/HYSTEROSCOPY WITH MYOSURE RESECTION OF POLYP;  Surgeon:  Megan Salon, MD;  Location: [facility];  Service: Gynecology;  Laterality: N/A;  resection of polyp,  to follow first case  . INJECTION KNEE Left 10/14/2016   Procedure: CORTISONE LEFT KNEE INJECTION;  Surgeon: Gaynelle Arabian, MD;  [facility];  Service: Orthopedics;  Laterality: Left;  . LAPAROSCOPIC GASTRIC BANDING  2010 or 2011  . left knee injection  6 weeks ago  . POLYPECTOMY     TA  . ROTATOR CUFF REPAIR Left 05/2019  . TOTAL KNEE ARTHROPLASTY Right 10/14/2016   Procedure: RIGHT TOTAL KNEE ARTHROPLASTY;  Surgeon: Gaynelle Arabian, MD;  Location: [facility];  Service: Orthopedics;  Laterality: Right;  Adductor Block  . TOTAL KNEE ARTHROPLASTY Left 04/24/2020   Procedure: TOTAL KNEE ARTHROPLASTY;  Surgeon: Gaynelle Arabian, MD;  Location[facility];  Service: Orthopedics;  Laterality: Left;  76min    There were no vitals filed for this visit.    Subjective Assessment - 05/01/20 1608    Subjective Patient presents to physical therapy with complaint of LT knee pain s/p LT TKA on 04/24/20. Patient says she is in a lot of pain. Says she went  to ED yesterday because she accidently took too much Xarelto.  Is currently using RW for ambulation. Has follow up next week. Is currently managing symptoms with prescribed pain medication.    Pertinent History LT TKA 04/24/20    Limitations Sitting;Standing;Lifting;Walking;House hold activities    Patient Stated Goals do better than I was doing, get back into gym    Currently in Pain? Yes    Pain Score 8     Pain Location Knee    Pain Orientation Left    Pain Descriptors / Indicators Aching;Throbbing;Sharp    Pain Type Surgical pain    Pain Onset In the past 7 days    Pain Frequency Constant    Aggravating Factors  bending, walking, standing    Pain Relieving Factors rest, meds    Effect of Pain on Daily Activities Limits              [facility] PT Assessment - 05/01/20 0001      Assessment   Medical Diagnosis LT TKA     Referring  Provider (PT) Theresa Duty Pa-C     Onset Date/Surgical Date 04/24/20    Next MD Visit --   Not sure    Prior Therapy Yes       Precautions   Precautions None      Restrictions   Weight Bearing Restrictions No      Balance Screen   Has the patient fallen in the past 6 months No      [place] residence    Living Arrangements Other relatives      Prior Function   Level of Independence Independent    Vocation Retired      Associate Professor   Overall Cognitive Status Within Functional Limits for tasks assessed      Observation/Other Assessments   Observations post op bandage intatc, no visible drainage, some mild erythema noted about medical aspect of LT knee     Focus on Therapeutic Outcomes (FOTO)  67% limited       Observation/Other Assessments-Edema    Edema --   Mod Edema noted diffuse about LT knee joint      ROM / Strength   AROM / PROM / Strength AROM      AROM   AROM Assessment Site Knee    Right/Left Knee Right;Left    Right Knee Extension 0    Right Knee Flexion 115    Left Knee Extension 9    Left Knee Flexion 55      Transfers   Transfers Stand to Sit;Sit to Stand    Sit to Stand 6: Modified independent (Device/Increase time)   heavy use of UEs weightshifts toward RT, hold LLE in extensi     Ambulation/Gait   Ambulation/Gait Yes    Ambulation/Gait Assistance 6: Modified independent (Device/Increase time);5: Supervision    Assistive device Rolling walker    Gait Pattern Step-to pattern;Decreased stance time - left;Decreased step length - right;Decreased stride length;Decreased hip/knee flexion - left;Decreased dorsiflexion - left;Antalgic;Trunk flexed;Poor foot clearance - left    Ambulation Surface Level;Indoor                      Objective measurements completed on examination: See above findings.       [facility] Adult PT Treatment/Exercise - 05/01/20 0001      Exercises   Exercises Knee/Hip       Knee/Hip Exercises: Supine   Quad Sets Left;5 reps  Heel Slides Left;5 reps    Other Supine Knee/Hip Exercises glute set 5 x 5"     Other Supine Knee/Hip Exercises ankle pumps x5                  PT Education - 05/01/20 1610    Education Details on evaluation findings, POC and HEP    Person(s) Educated Patient    Methods Explanation    Comprehension Verbalized understanding            PT Short Term Goals - 05/01/20 1641      PT SHORT TERM GOAL #1   Title Patient will be independent with initial HEP and self-management strategies to improve functional outcomes    Time 2    Period Weeks    Status New    Target Date 05/26/20      PT SHORT TERM GOAL #2   Title Patient will have LT knee AROM 5-90 degrees to improve functional mobility and facilitate squatting to pick up items from floor.    Time 3    Period Weeks    Status New    Target Date 05/26/20             PT Long Term Goals - 05/01/20 1643      PT LONG TERM GOAL #1   Title Patient will have LT knee AROM 0-120 degrees to improve functional mobility and facilitate squatting to pick up items from floor.    Time 6    Period Weeks    Status New    Target Date 06/16/20      PT LONG TERM GOAL #2   Title Patient will be able to perform stand x 5 in < 15 seconds to demonstrate improvement in functional mobility and reduced risk for falls.    Time 6    Period Weeks    Status New    Target Date 06/16/20      PT LONG TERM GOAL #3   Title Patient will improve FOTO score by 20% to indicate improvement in functional outcomes    Time 6    Period Weeks    Status New    Target Date 06/16/20      PT LONG TERM GOAL #4   Title Patient will be able to ambulate at least 300 feet during 2MWT with LRAD to demonstrate improved ability to perform functional mobility and associated tasks.    Time 6    Period Weeks    Status New    Target Date 06/16/20                  Plan - 05/01/20 1638    Clinical  Impression Statement Patient is a 69 y.o. female who presents to physical therapy with complaint of Lt knee pain s/p LT TKA on 04/24/20. Patient demonstrates decreased strength, ROM restriction, balance deficits and gait abnormalities which are likely contributing to symptoms of pain and are negatively impacting patient ability to perform ADLs and functional mobility tasks. Patient will benefit from skilled physical therapy services to address these deficits to reduce pain and improve level of function with ADLs and functional mobility tasks    Examination-Activity Limitations Locomotion Level;Lift;Transfers;Stand;Carry;Stairs;Bend;Squat;Sleep    Examination-Participation Restrictions Community Activity;Laundry;Yard Work;Cleaning    Stability/Clinical Decision Making Stable/Uncomplicated    Clinical Decision Making Low    Rehab Potential Good    PT Frequency 3x / week    PT Duration 6 weeks    PT Treatment/Interventions ADLs/Self  Care Home Management;Aquatic Therapy;Biofeedback;Cryotherapy;Electrical Stimulation;Therapeutic activities;Iontophoresis 4mg /ml Dexamethasone;Moist Heat;Traction;Balance training;Manual techniques;Manual lymph drainage;Therapeutic exercise;Taping;Vasopneumatic Device;Vestibular;Splinting;Functional mobility training;Energy conservation;Orthotic Fit/Training;Stair training;Gait training;DME Instruction;Passive range of motion;Patient/family education;Dry needling;Joint Manipulations;Spinal Manipulations;Compression bandaging;Scar mobilization;Neuromuscular re-education;Fluidtherapy;Contrast Bath;Parrafin;Ultrasound    PT Next Visit Plan Review goals and HEP. Progress knee strength and mobility on mat. Transition to standing functional strength, gait and balance when ready. Manual PRN for pain, restriciton and edema    PT Home Exercise Plan 05/01/20: ankle pump, quad set, glute set, heel slide    Consulted and Agree with Plan of Care Patient           Patient will benefit  from skilled therapeutic intervention in order to improve the following deficits and impairments:  Abnormal gait, Increased edema, Decreased scar mobility, Decreased activity tolerance, Decreased balance, Decreased mobility, Difficulty walking, Pain, Increased fascial restricitons, Decreased strength, Decreased range of motion, Impaired flexibility, Improper body mechanics  Visit Diagnosis: Left knee pain, unspecified chronicity  Stiffness of left knee, not elsewhere classified  Other abnormalities of gait and mobility     Problem List Patient Active Problem List   Diagnosis Date Noted  . Primary osteoarthritis of right knee 04/24/2020  . Endometrial polyp 03/19/2020  . Fibroids, intramural 11/12/2019  . Hypertension   . Hypercholesteremia   . History of kidney stones   . Diabetes mellitus without complication ([place])   . Colon polyps   . OA (osteoarthritis) of knee 10/14/2016    5:07 PM, 05/01/20 Josue Hector PT DPT  Physical Therapist with [facility]  [phone] [place] [address] [place], [place], [address] Phone: [phone]   Fax:  [phone]  Name: Amy Reyes MRN: [id number] Date of Birth: Nov 28, 1950

## 2020-05-02 DIAGNOSIS — Z96652 Presence of left artificial knee joint: Secondary | ICD-10-CM | POA: Insufficient documentation

## 2020-05-03 ENCOUNTER — Ambulatory Visit (HOSPITAL_COMMUNITY): Payer: Medicare HMO | Admitting: Physical Therapy

## 2020-05-03 ENCOUNTER — Other Ambulatory Visit: Payer: Self-pay

## 2020-05-03 ENCOUNTER — Encounter (HOSPITAL_COMMUNITY): Payer: Self-pay | Admitting: Physical Therapy

## 2020-05-03 DIAGNOSIS — M25562 Pain in left knee: Secondary | ICD-10-CM

## 2020-05-03 DIAGNOSIS — M25662 Stiffness of left knee, not elsewhere classified: Secondary | ICD-10-CM | POA: Diagnosis not present

## 2020-05-03 DIAGNOSIS — R2689 Other abnormalities of gait and mobility: Secondary | ICD-10-CM | POA: Diagnosis not present

## 2020-05-03 NOTE — Therapy (Signed)
[place], [place], [address] Phone: [phone]   Fax:  [phone]  Physical Therapy Treatment  Patient Details  Name: Amy Reyes MRN: [id number] Date of Birth: 12-31-1950 Referring Provider (PT): Theresa Duty Pa-C    Encounter Date: 05/03/2020   PT End of Session - 05/03/20 1105    Visit Number 2    Number of Visits 18    Date for PT Re-Evaluation 06/16/20    Authorization Type [employer]    Authorization Time Period approved 12 vits from 10/18-11/19/21    Authorization - Visit Number 2    Authorization - Number of Visits 12    PT Start Time 3267   arrives late   PT Stop Time 1245    PT Time Calculation (min) 40 min    Activity Tolerance Patient limited by pain;Patient limited by fatigue    Behavior During Therapy [facility] for tasks assessed/performed           Past Medical History:  Diagnosis Date  . Allergy    seasonal allergies  . Anemia    iron defiencey  . Arthritis    oa  . Bronchitis hx of  . Chronic kidney disease    ckd stage 2- 3 due to NSAIDS use no nephrologist  . Colon polyps   . Complication of anesthesia    slow to wake up one time, none recent  . Diabetes mellitus without complication ([place])    borderline type 2 on metformin  . GERD (gastroesophageal reflux disease)    on meds  . History of carpal tunnel syndrome    years ago  . History of kidney stones   . History of rotator cuff tear    Bilateral  . Hypercholesteremia    on meds  . Hypertension    on meds  . Peptic ulcer   . PMB (postmenopausal bleeding)    last time middle of april 2021    Past Surgical History:  Procedure Laterality Date  . ABDOMINOPLASTY  30 yrs ago   tummy tuck  . BREAST SURGERY     reduction  . CHOLECYSTECTOMY  2000   laparoscopic  . COLONOSCOPY  2019   TA  . DILATATION & CURETTAGE/HYSTEROSCOPY WITH MYOSURE N/A 12/20/2019   Procedure: DILATATION & CURETTAGE/HYSTEROSCOPY WITH  MYOSURE RESECTION OF POLYP;  Surgeon: Megan Salon, MD;  Location: [place];  Service: Gynecology;  Laterality: N/A;  resection of polyp,  to follow first case  . INJECTION KNEE Left 10/14/2016   Procedure: CORTISONE LEFT KNEE INJECTION;  Surgeon: Gaynelle Arabian, MD;  Location: [facility];  Service: Orthopedics;  Laterality: Left;  . LAPAROSCOPIC GASTRIC BANDING  2010 or 2011  . left knee injection  6 weeks ago  . POLYPECTOMY     TA  . ROTATOR CUFF REPAIR Left 05/2019  . TOTAL KNEE ARTHROPLASTY Right 10/14/2016   Procedure: RIGHT TOTAL KNEE ARTHROPLASTY;  Surgeon: Gaynelle Arabian, MD;  Location: [facility];  Service: Orthopedics;  Laterality: Right;  Adductor Block  . TOTAL KNEE ARTHROPLASTY Left 04/24/2020   Procedure: TOTAL KNEE ARTHROPLASTY;  Surgeon: Gaynelle Arabian, MD;  Location: [facility];  Service: Orthopedics;  Laterality: Left;  72min    There were no vitals filed for this visit.   Subjective Assessment - 05/03/20 1106    Subjective Patient states she has had a hectic day. She left her walker in the car because she was in a rush so she  got her cane. She checked in with her MD yesterday and he said no infection. She is having really bad pain today. She is switching her pain meds.    Pertinent History LT TKA 04/24/20    Limitations Sitting;Standing;Lifting;Walking;House hold activities    Patient Stated Goals do better than I was doing, get back into gym    Currently in Pain? Yes    Pain Score 10-Worst pain ever    Pain Location Knee    Pain Orientation Left    Pain Descriptors / Indicators Aching    Pain Type Surgical pain    Pain Onset In the past 7 days                             [facility] Adult PT Treatment/Exercise - 05/03/20 0001      Knee/Hip Exercises: Supine   Quad Sets Left;10 reps;2 sets    [employer] Limitations 10 second holds    Short [employer] AROM;Left;10 reps;2 sets    Short Arc Quad Sets Limitations 5 second holds    Knee Extension  AROM    Knee Extension Limitations 7   lacking   Knee Flexion AROM    Knee Flexion Limitations 73    Other Supine Knee/Hip Exercises glute set 5 x 5"     Other Supine Knee/Hip Exercises ankle pumps x20      Manual Therapy   Manual Therapy Edema management    Manual therapy comments completed independently from all other aspects of treatment    Edema Management retrograde massage for edema with wedge for pain/edema at end of session                  PT Education - 05/03/20 1104    Education Details educated on HEP, exercise mechanics    Person(s) Educated Patient    Methods Explanation;Demonstration    Comprehension Verbalized understanding;Returned demonstration            PT Short Term Goals - 05/03/20 1148      PT SHORT TERM GOAL #1   Title Patient will be independent with initial HEP and self-management strategies to improve functional outcomes    Time 2    Period Weeks    Status On-going    Target Date 05/26/20      PT SHORT TERM GOAL #2   Title Patient will have LT knee AROM 5-90 degrees to improve functional mobility and facilitate squatting to pick up items from floor.    Time 3    Period Weeks    Status On-going    Target Date 05/26/20             PT Long Term Goals - 05/03/20 1148      PT LONG TERM GOAL #1   Title Patient will have LT knee AROM 0-120 degrees to improve functional mobility and facilitate squatting to pick up items from floor.    Time 6    Period Weeks    Status On-going      PT LONG TERM GOAL #2   Title Patient will be able to perform stand x 5 in < 15 seconds to demonstrate improvement in functional mobility and reduced risk for falls.    Time 6    Period Weeks    Status On-going      PT LONG TERM GOAL #3   Title Patient will improve FOTO score by 20% to indicate  improvement in functional outcomes    Time 6    Period Weeks    Status On-going      PT LONG TERM GOAL #4   Title Patient will be able to ambulate at least  300 feet during 2MWT with LRAD to demonstrate improved ability to perform functional mobility and associated tasks.    Time 6    Period Weeks    Status On-going                 Plan - 05/03/20 1106    Clinical Impression Statement Patient arrives late for appointment today and c/o excessive pain. She is educated on taking pain meds as prescribed. Patient demonstrates improving ROM today from lacking 7 to 73 degrees. Patient with good quad set with initial demonstration and cueing. Patient very apprehensive of pain today and requires frequent encouragement and reassurance. Ended session with edema massage secondary to LE edema and pain which patient tolerates well. Patient states improvement in symptoms at end of session. Patient will continue to benefit from skilled physical therapy in order to reduce impairment and improve function.    Examination-Activity Limitations Locomotion Level;Lift;Transfers;Stand;Carry;Stairs;Bend;Squat;Sleep    Examination-Participation Restrictions Community Activity;Laundry;Yard Work;Cleaning    Stability/Clinical Decision Making Stable/Uncomplicated    Rehab Potential Good    PT Frequency 3x / week    PT Duration 6 weeks    PT Treatment/Interventions ADLs/Self Care Home Management;Aquatic Therapy;Biofeedback;Cryotherapy;Electrical Stimulation;Therapeutic activities;Iontophoresis 4mg /ml Dexamethasone;Moist Heat;Traction;Balance training;Manual techniques;Manual lymph drainage;Therapeutic exercise;Taping;Vasopneumatic Device;Vestibular;Splinting;Functional mobility training;Energy conservation;Orthotic Fit/Training;Stair training;Gait training;DME Instruction;Passive range of motion;Patient/family education;Dry needling;Joint Manipulations;Spinal Manipulations;Compression bandaging;Scar mobilization;Neuromuscular re-education;Fluidtherapy;Contrast Bath;Parrafin;Ultrasound    PT Next Visit Plan Progress knee strength and mobility on mat. Transition to standing  functional strength, gait and balance when ready. Manual PRN for pain, restriciton and edema    PT Home Exercise Plan 05/01/20: ankle pump, quad set, glute set, heel slide    Consulted and Agree with Plan of Care Patient           Patient will benefit from skilled therapeutic intervention in order to improve the following deficits and impairments:  Abnormal gait, Increased edema, Decreased scar mobility, Decreased activity tolerance, Decreased balance, Decreased mobility, Difficulty walking, Pain, Increased fascial restricitons, Decreased strength, Decreased range of motion, Impaired flexibility, Improper body mechanics  Visit Diagnosis: Left knee pain, unspecified chronicity  Stiffness of left knee, not elsewhere classified  Other abnormalities of gait and mobility     Problem List Patient Active Problem List   Diagnosis Date Noted  . Primary osteoarthritis of right knee 04/24/2020  . Endometrial polyp 03/19/2020  . Fibroids, intramural 11/12/2019  . Hypertension   . Hypercholesteremia   . History of kidney stones   . Diabetes mellitus without complication ([place])   . Colon polyps   . OA (osteoarthritis) of knee 10/14/2016    11:50 AM, 05/03/20 Mearl Latin PT, DPT Physical Therapist at [place], [place], [address] Phone: [phone]   Fax:  [phone]  Name: Amy Reyes MRN: [id number] Date of Birth: [birth year]

## 2020-05-05 ENCOUNTER — Telehealth (HOSPITAL_COMMUNITY): Payer: Self-pay

## 2020-05-05 ENCOUNTER — Ambulatory Visit (HOSPITAL_COMMUNITY): Payer: Medicare HMO

## 2020-05-05 NOTE — Telephone Encounter (Signed)
Pt l/m to cx today she is not feeling well today and will call us and let us know about Monday @ a later time.

## 2020-05-08 ENCOUNTER — Other Ambulatory Visit: Payer: Self-pay

## 2020-05-08 ENCOUNTER — Ambulatory Visit (HOSPITAL_COMMUNITY): Payer: Medicare HMO | Admitting: Physical Therapy

## 2020-05-08 DIAGNOSIS — R2689 Other abnormalities of gait and mobility: Secondary | ICD-10-CM | POA: Diagnosis not present

## 2020-05-08 DIAGNOSIS — M25662 Stiffness of left knee, not elsewhere classified: Secondary | ICD-10-CM

## 2020-05-08 DIAGNOSIS — M25562 Pain in left knee: Secondary | ICD-10-CM | POA: Diagnosis not present

## 2020-05-08 NOTE — Therapy (Signed)
[place], [place], [address] Phone: [phone]   Fax:  [phone]  Physical Therapy Treatment  Patient Details  Name: Amy Reyes MRN: [id number] Date of Birth: October 22, 1950 Referring Provider (PT): Theresa Duty Pa-C    Encounter Date: 05/08/2020   PT End of Session - 05/08/20 1324    Visit Number 3    Number of Visits 18    Date for PT Re-Evaluation 06/16/20    Authorization Type [employer]    Authorization Time Period approved 12 vits from 10/18-11/19/21    Authorization - Visit Number 3    Authorization - Number of Visits 12    PT Start Time 2094    PT Stop Time 7096   had to leave early to pick up grandson   PT Time Calculation (min) 26 min    Activity Tolerance Patient limited by pain;Patient limited by fatigue    Behavior During Therapy [facility] for tasks assessed/performed           Past Medical History:  Diagnosis Date  . Allergy    seasonal allergies  . Anemia    iron defiencey  . Arthritis    oa  . Bronchitis hx of  . Chronic kidney disease    ckd stage 2- 3 due to NSAIDS use no nephrologist  . Colon polyps   . Complication of anesthesia    slow to wake up one time, none recent  . Diabetes mellitus without complication ([place])    borderline type 2 on metformin  . GERD (gastroesophageal reflux disease)    on meds  . History of carpal tunnel syndrome    years ago  . History of kidney stones   . History of rotator cuff tear    Bilateral  . Hypercholesteremia    on meds  . Hypertension    on meds  . Peptic ulcer   . PMB (postmenopausal bleeding)    last time middle of april 2021    Past Surgical History:  Procedure Laterality Date  . ABDOMINOPLASTY  30 yrs ago   tummy tuck  . BREAST SURGERY     reduction  . CHOLECYSTECTOMY  2000   laparoscopic  . COLONOSCOPY  2019   TA  . DILATATION & CURETTAGE/HYSTEROSCOPY WITH MYOSURE N/A 12/20/2019   Procedure: DILATATION &  CURETTAGE/HYSTEROSCOPY WITH MYOSURE RESECTION OF POLYP;  Surgeon: Megan Salon, MD;  Location: [place];  Service: Gynecology;  Laterality: N/A;  resection of polyp,  to follow first case  . INJECTION KNEE Left 10/14/2016   Procedure: CORTISONE LEFT KNEE INJECTION;  Surgeon: Gaynelle Arabian, MD;  [facility];  Service: Orthopedics;  Laterality: Left;  . LAPAROSCOPIC GASTRIC BANDING  2010 or 2011  . left knee injection  6 weeks ago  . POLYPECTOMY     TA  . ROTATOR CUFF REPAIR Left 05/2019  . TOTAL KNEE ARTHROPLASTY Right 10/14/2016   Procedure: RIGHT TOTAL KNEE ARTHROPLASTY;  Surgeon: Gaynelle Arabian, MD;  [facility];  Service: Orthopedics;  Laterality: Right;  Adductor Block  . TOTAL KNEE ARTHROPLASTY Left 04/24/2020   Procedure: TOTAL KNEE ARTHROPLASTY;  Surgeon: Gaynelle Arabian, MD;  [facility];  Service: Orthopedics;  Laterality: Left;  16min    There were no vitals filed for this visit.   Subjective Assessment - 05/08/20 1322    Subjective States that she has been having aching and stinging in her knee. States that she is trying  to do the best she can with the exercises. States walking a good bit and has been trying to do the one where she presses down.    Currently in Pain? Yes    Pain Score 8     Pain Location Knee    Pain Orientation Left;Anterior    Pain Descriptors / Indicators Aching              [facility] PT Assessment - 05/08/20 0001      Assessment   Medical Diagnosis LT TKA     Referring Provider (PT) Theresa Duty Pa-C     Onset Date/Surgical Date 04/24/20    Next MD Visit 05/11/20      Observation/Other Assessments   Observations incision healing, mild redness noted along medial side of incision, scabs over some of incision, one area where scab opened with minor recent bleeding. sewelling noted throughout                          [facility] Adult PT Treatment/Exercise - 05/08/20 0001      Knee/Hip Exercises: Aerobic    Stationary Bike rocking back and forth on bike for ROM. 5 minutes      Knee/Hip Exercises: Seated   Long Arc Quad AROM;Left;2 sets;10 reps      Knee/Hip Exercises: Supine   Heel Slides AAROM;Left;10 reps   with strap, 10" holds   Knee Extension AROM;Left    Knee Extension Limitations 10    Knee Flexion AAROM    Knee Flexion Limitations 80                  PT Education - 05/08/20 1337    Education Details on HEP, on importance of elevation, on importance of HEP adherence    Person(s) Educated Patient    Methods Explanation    Comprehension Verbalized understanding            PT Short Term Goals - 05/03/20 1148      PT SHORT TERM GOAL #1   Title Patient will be independent with initial HEP and self-management strategies to improve functional outcomes    Time 2    Period Weeks    Status On-going    Target Date 05/26/20      PT SHORT TERM GOAL #2   Title Patient will have LT knee AROM 5-90 degrees to improve functional mobility and facilitate squatting to pick up items from floor.    Time 3    Period Weeks    Status On-going    Target Date 05/26/20             PT Long Term Goals - 05/03/20 1148      PT LONG TERM GOAL #1   Title Patient will have LT knee AROM 0-120 degrees to improve functional mobility and facilitate squatting to pick up items from floor.    Time 6    Period Weeks    Status On-going      PT LONG TERM GOAL #2   Title Patient will be able to perform stand x 5 in < 15 seconds to demonstrate improvement in functional mobility and reduced risk for falls.    Time 6    Period Weeks    Status On-going      PT LONG TERM GOAL #3   Title Patient will improve FOTO score by 20% to indicate improvement in functional outcomes    Time 6    Period  Weeks    Status On-going      PT LONG TERM GOAL #4   Title Patient will be able to ambulate at least 300 feet during 2MWT with LRAD to demonstrate improved ability to perform functional mobility and  associated tasks.    Time 6    Period Weeks    Status On-going                 Plan - 05/08/20 1326    Clinical Impression Statement Pain noted with all movements. Session also limited today secondary to patient needing to leave early to pick up grandson. Patient only tolerated supine position for heel slides and then needed to change positions secondary to pain. Patient reported she would plan her pain meds prior to next session. Swelling, redness and recent scabbing/bleeding from incision (as if scab fell off recently). Discussed elevation of leg. Redness is minor and only along medial side of knee incision, will monitor in future sessions.    Examination-Activity Limitations Locomotion Level;Lift;Transfers;Stand;Carry;Stairs;Bend;Squat;Sleep    Examination-Participation Restrictions Community Activity;Laundry;Yard Work;Cleaning    Stability/Clinical Decision Making Stable/Uncomplicated    Rehab Potential Good    PT Frequency 3x / week    PT Duration 6 weeks    PT Treatment/Interventions ADLs/Self Care Home Management;Aquatic Therapy;Biofeedback;Cryotherapy;Electrical Stimulation;Therapeutic activities;Iontophoresis 4mg /ml Dexamethasone;Moist Heat;Traction;Balance training;Manual techniques;Manual lymph drainage;Therapeutic exercise;Taping;Vasopneumatic Device;Vestibular;Splinting;Functional mobility training;Energy conservation;Orthotic Fit/Training;Stair training;Gait training;DME Instruction;Passive range of motion;Patient/family education;Dry needling;Joint Manipulations;Spinal Manipulations;Compression bandaging;Scar mobilization;Neuromuscular re-education;Fluidtherapy;Contrast Bath;Parrafin;Ultrasound    PT Next Visit Plan Progress knee strength and mobility on mat. Transition to standing functional strength, gait and balance when ready. Manual PRN for pain, restriciton and edema    PT Home Exercise Plan 05/01/20: ankle pump, quad set, glute set, heel slide; 10/25 LAQs    Consulted  and Agree with Plan of Care Patient           Patient will benefit from skilled therapeutic intervention in order to improve the following deficits and impairments:  Abnormal gait, Increased edema, Decreased scar mobility, Decreased activity tolerance, Decreased balance, Decreased mobility, Difficulty walking, Pain, Increased fascial restricitons, Decreased strength, Decreased range of motion, Impaired flexibility, Improper body mechanics  Visit Diagnosis: Left knee pain, unspecified chronicity  Stiffness of left knee, not elsewhere classified  Other abnormalities of gait and mobility     Problem List Patient Active Problem List   Diagnosis Date Noted  . Primary osteoarthritis of right knee 04/24/2020  . Endometrial polyp 03/19/2020  . Fibroids, intramural 11/12/2019  . Hypertension   . Hypercholesteremia   . History of kidney stones   . Diabetes mellitus without complication ([place])   . Colon polyps   . OA (osteoarthritis) of knee 10/14/2016  1:51 PM, 05/08/20 Jerene Pitch, DPT Physical Therapy with [facility]  [phone] office  [place] [address] [place], [place], [address] Phone: [phone]   Fax:  [phone]  Name: Amy Reyes MRN: [id number] Date of Birth: Feb 23, 1951

## 2020-05-10 ENCOUNTER — Other Ambulatory Visit: Payer: Self-pay

## 2020-05-10 ENCOUNTER — Ambulatory Visit (HOSPITAL_COMMUNITY): Payer: Medicare HMO | Admitting: Physical Therapy

## 2020-05-10 ENCOUNTER — Encounter (HOSPITAL_COMMUNITY): Payer: Self-pay | Admitting: Physical Therapy

## 2020-05-10 DIAGNOSIS — M25562 Pain in left knee: Secondary | ICD-10-CM | POA: Diagnosis not present

## 2020-05-10 DIAGNOSIS — M25662 Stiffness of left knee, not elsewhere classified: Secondary | ICD-10-CM | POA: Diagnosis not present

## 2020-05-10 DIAGNOSIS — R2689 Other abnormalities of gait and mobility: Secondary | ICD-10-CM

## 2020-05-10 NOTE — Therapy (Signed)
[place], [place], [address] Phone: [phone]   Fax:  [phone]  Physical Therapy Treatment  Patient Details  Name: Amy Reyes MRN: [id number] Date of Birth: [birth year] Referring Provider (PT): Theresa Duty Pa-C    Encounter Date: 05/10/2020   PT End of Session - 05/10/20 1135    Visit Number 4    Number of Visits 18    Date for PT Re-Evaluation 06/16/20    Authorization Type [employer]    Authorization Time Period approved 12 vits from 10/18-11/19/21    Authorization - Visit Number 4    Authorization - Number of Visits 12    PT Start Time 8466    PT Stop Time 1206    PT Time Calculation (min) 38 min    Activity Tolerance Patient limited by pain;Patient tolerated treatment well    Behavior During Therapy [facility] for tasks assessed/performed           Past Medical History:  Diagnosis Date  . Allergy    seasonal allergies  . Anemia    iron defiencey  . Arthritis    oa  . Bronchitis hx of  . Chronic kidney disease    ckd stage 2- 3 due to NSAIDS use no nephrologist  . Colon polyps   . Complication of anesthesia    slow to wake up one time, none recent  . Diabetes mellitus without complication ([place])    borderline type 2 on metformin  . GERD (gastroesophageal reflux disease)    on meds  . History of carpal tunnel syndrome    years ago  . History of kidney stones   . History of rotator cuff tear    Bilateral  . Hypercholesteremia    on meds  . Hypertension    on meds  . Peptic ulcer   . PMB (postmenopausal bleeding)    last time middle of april 2021    Past Surgical History:  Procedure Laterality Date  . ABDOMINOPLASTY  30 yrs ago   tummy tuck  . BREAST SURGERY     reduction  . CHOLECYSTECTOMY  2000   laparoscopic  . COLONOSCOPY  2019   TA  . DILATATION & CURETTAGE/HYSTEROSCOPY WITH MYOSURE N/A 12/20/2019   Procedure: DILATATION & CURETTAGE/HYSTEROSCOPY WITH MYOSURE  RESECTION OF POLYP;  Surgeon: Megan Salon, MD;  Location: [facility];  Service: Gynecology;  Laterality: N/A;  resection of polyp,  to follow first case  . INJECTION KNEE Left 10/14/2016   Procedure: CORTISONE LEFT KNEE INJECTION;  Surgeon: Gaynelle Arabian, MD;  [facility];  Service: Orthopedics;  Laterality: Left;  . LAPAROSCOPIC GASTRIC BANDING  2010 or 2011  . left knee injection  6 weeks ago  . POLYPECTOMY     TA  . ROTATOR CUFF REPAIR Left 05/2019  . TOTAL KNEE ARTHROPLASTY Right 10/14/2016   Procedure: RIGHT TOTAL KNEE ARTHROPLASTY;  Surgeon: Gaynelle Arabian, MD;  [facility];  Service: Orthopedics;  Laterality: Right;  Adductor Block  . TOTAL KNEE ARTHROPLASTY Left 04/24/2020   Procedure: TOTAL KNEE ARTHROPLASTY;  Surgeon: Gaynelle Arabian, MD;  [facility];  Service: Orthopedics;  Laterality: Left;  29min    There were no vitals filed for this visit.   Subjective Assessment - 05/10/20 1133    Subjective Patient says she has "been is so much pain". Says here medicine got messed up but she had MD appointment this morning and got this sorted  out. Says she feels it a little bit.    Pertinent History LT TKA 04/24/20    Currently in Pain? Yes    Pain Score 7     Pain Location Knee    Pain Orientation Left    Pain Descriptors / Indicators Aching    Pain Type Surgical pain    Pain Onset 1 to 4 weeks ago    Pain Frequency Constant                             [facility] Adult PT Treatment/Exercise - 05/10/20 0001      Knee/Hip Exercises: Standing   Heel Raises Both;1 set;15 reps    Other Standing Knee Exercises tandem stance 2 x 30" solid floor       Knee/Hip Exercises: Seated   Sit to Sand 1 set;10 reps;with UE support      Knee/Hip Exercises: Supine   Quad Sets Left;1 set;15 reps    Quad Sets Limitations 5"    Heel Slides Left;10 reps    Heel Slides Limitations 5"     Straight Leg Raises Left;1 set;10 reps    Knee Extension AROM;Left     Knee Extension Limitations 9    Knee Flexion Left;AROM    Knee Flexion Limitations 82      Manual Therapy   Manual Therapy Edema management    Manual therapy comments completed independently from all other aspects of treatment    Edema Management retrograde massage for edema with wedge for pain/edema at end of session      Prosthetics   Prosthetic Care Comments  --                    PT Short Term Goals - 05/03/20 1148      PT [place] #1   Title Patient will be independent with initial HEP and self-management strategies to improve functional outcomes    Time 2    Period Weeks    Status On-going    Target Date 05/26/20      PT SHORT TERM GOAL #2   Title Patient will have LT knee AROM 5-90 degrees to improve functional mobility and facilitate squatting to pick up items from floor.    Time 3    Period Weeks    Status On-going    Target Date 05/26/20             PT Long Term Goals - 05/03/20 1148      PT LONG TERM GOAL #1   Title Patient will have LT knee AROM 0-120 degrees to improve functional mobility and facilitate squatting to pick up items from floor.    Time 6    Period Weeks    Status On-going      PT LONG TERM GOAL #2   Title Patient will be able to perform stand x 5 in < 15 seconds to demonstrate improvement in functional mobility and reduced risk for falls.    Time 6    Period Weeks    Status On-going      PT LONG TERM GOAL #3   Title Patient will improve FOTO score by 20% to indicate improvement in functional outcomes    Time 6    Period Weeks    Status On-going      PT LONG TERM GOAL #4   Title Patient will be able to ambulate at least 300 feet during  2MWT with LRAD to demonstrate improved ability to perform functional mobility and associated tasks.    Time 6    Period Weeks    Status On-going                 Plan - 05/10/20 1646    Clinical Impression Statement Patient continues to be pain limited with most all  activity. Patient educated on elevation and compression garment for assisting to reduce swelling and decrease pain. Patient progressed with standing LE strengthening and static balance. Patient ambulates in clinic today with no AD, shows fairly good stability and did well with maintaining static tandem stance. Performed manual edema message for reduced knee pain and swelling. patient notes improved mobility and decreased knee pain post session. Will continue to progress knee ROM and activity as tolerated.    Examination-Activity Limitations Locomotion Level;Lift;Transfers;Stand;Carry;Stairs;Bend;Squat;Sleep    Examination-Participation Restrictions Community Activity;Laundry;Yard Work;Cleaning    Stability/Clinical Decision Making Stable/Uncomplicated    Rehab Potential Good    PT Frequency 3x / week    PT Duration 6 weeks    PT Treatment/Interventions ADLs/Self Care Home Management;Aquatic Therapy;Biofeedback;Cryotherapy;Electrical Stimulation;Therapeutic activities;Iontophoresis 4mg /ml Dexamethasone;Moist Heat;Traction;Balance training;Manual techniques;Manual lymph drainage;Therapeutic exercise;Taping;Vasopneumatic Device;Vestibular;Splinting;Functional mobility training;Energy conservation;Orthotic Fit/Training;Stair training;Gait training;DME Instruction;Passive range of motion;Patient/family education;Dry needling;Joint Manipulations;Spinal Manipulations;Compression bandaging;Scar mobilization;Neuromuscular re-education;Fluidtherapy;Contrast Bath;Parrafin;Ultrasound    PT Next Visit Plan Progress knee strength and mobility on mat. Transition to standing functional strength, gait and balance when ready. Manual PRN for pain, restriciton and edema    PT Home Exercise Plan 05/01/20: ankle pump, quad set, glute set, heel slide; 10/25 LAQs 05/10/20: sit to stand, heel raise    Consulted and Agree with Plan of Care Patient           Patient will benefit from skilled therapeutic intervention in order  to improve the following deficits and impairments:  Abnormal gait, Increased edema, Decreased scar mobility, Decreased activity tolerance, Decreased balance, Decreased mobility, Difficulty walking, Pain, Increased fascial restricitons, Decreased strength, Decreased range of motion, Impaired flexibility, Improper body mechanics  Visit Diagnosis: Left knee pain, unspecified chronicity  Stiffness of left knee, not elsewhere classified  Other abnormalities of gait and mobility     Problem List Patient Active Problem List   Diagnosis Date Noted  . Primary osteoarthritis of right knee 04/24/2020  . Endometrial polyp 03/19/2020  . Fibroids, intramural 11/12/2019  . Hypertension   . Hypercholesteremia   . History of kidney stones   . Diabetes mellitus without complication ([place])   . Colon polyps   . OA (osteoarthritis) of knee 10/14/2016    4:48 PM, 05/10/20 Josue Hector PT DPT  Physical Therapist with [facility]  [phone] [place], [place], [address] Phone: [phone]   Fax:  [phone]  Name: Amy Reyes MRN: [id number] Date of Birth: [birth year]

## 2020-05-10 NOTE — Patient Instructions (Signed)
Access Code: [email or web address] URL: [email or web address] Date: 05/10/2020 Prepared by: Josue Hector  Exercises Supine Knee Extension Mobilization with Weight - 3 x daily - 7 x weekly - 1 sets - 3 reps - 60 seconds hold Sit to Stand with Armchair - 3 x daily - 7 x weekly - 2 sets - 10 reps

## 2020-05-12 ENCOUNTER — Other Ambulatory Visit: Payer: Self-pay

## 2020-05-12 ENCOUNTER — Other Ambulatory Visit: Payer: Self-pay | Admitting: Gastroenterology

## 2020-05-12 ENCOUNTER — Ambulatory Visit (HOSPITAL_COMMUNITY): Payer: Medicare HMO | Admitting: Physical Therapy

## 2020-05-12 ENCOUNTER — Encounter (HOSPITAL_COMMUNITY): Payer: Self-pay | Admitting: Physical Therapy

## 2020-05-12 DIAGNOSIS — E538 Deficiency of other specified B group vitamins: Secondary | ICD-10-CM

## 2020-05-12 DIAGNOSIS — M25662 Stiffness of left knee, not elsewhere classified: Secondary | ICD-10-CM | POA: Diagnosis not present

## 2020-05-12 DIAGNOSIS — M25562 Pain in left knee: Secondary | ICD-10-CM | POA: Diagnosis not present

## 2020-05-12 DIAGNOSIS — R2689 Other abnormalities of gait and mobility: Secondary | ICD-10-CM | POA: Diagnosis not present

## 2020-05-12 NOTE — Therapy (Signed)
[place], [place], [address] Phone: [phone]   Fax:  [phone]  Physical Therapy Treatment  Patient Details  Name: DYANN GOODSPEED MRN: [id number] Date of Birth: [birth year] Referring Provider (PT): Theresa Duty Pa-C    Encounter Date: 05/12/2020   PT End of Session - 05/12/20 0840    Visit Number 5    Number of Visits 18    Date for PT Re-Evaluation 06/16/20    Authorization Type [employer]    Authorization Time Period approved 12 vits from 10/18-11/19/21    Authorization - Visit Number 5    Authorization - Number of Visits 12    PT Start Time [id number]   arrives late   PT Stop Time 0912    PT Time Calculation (min) 31 min    Activity Tolerance Patient limited by pain;Patient tolerated treatment well    Behavior During Therapy [facility] for tasks assessed/performed           Past Medical History:  Diagnosis Date  . Allergy    seasonal allergies  . Anemia    iron defiencey  . Arthritis    oa  . Bronchitis hx of  . Chronic kidney disease    ckd stage 2- 3 due to NSAIDS use no nephrologist  . Colon polyps   . Complication of anesthesia    slow to wake up one time, none recent  . Diabetes mellitus without complication ([place])    borderline type 2 on metformin  . GERD (gastroesophageal reflux disease)    on meds  . History of carpal tunnel syndrome    years ago  . History of kidney stones   . History of rotator cuff tear    Bilateral  . Hypercholesteremia    on meds  . Hypertension    on meds  . Peptic ulcer   . PMB (postmenopausal bleeding)    last time middle of april 2021    Past Surgical History:  Procedure Laterality Date  . ABDOMINOPLASTY  30 yrs ago   tummy tuck  . BREAST SURGERY     reduction  . CHOLECYSTECTOMY  2000   laparoscopic  . COLONOSCOPY  2019   TA  . DILATATION & CURETTAGE/HYSTEROSCOPY WITH MYOSURE N/A 12/20/2019   Procedure: DILATATION &  CURETTAGE/HYSTEROSCOPY WITH MYOSURE RESECTION OF POLYP;  Surgeon: Megan Salon, MD;  Location: [facility];  Service: Gynecology;  Laterality: N/A;  resection of polyp,  to follow first case  . INJECTION KNEE Left 10/14/2016   Procedure: CORTISONE LEFT KNEE INJECTION;  Surgeon: Gaynelle Arabian, MD;  [facility];  Service: Orthopedics;  Laterality: Left;  . LAPAROSCOPIC GASTRIC BANDING  2010 or 2011  . left knee injection  6 weeks ago  . POLYPECTOMY     TA  . ROTATOR CUFF REPAIR Left 05/2019  . TOTAL KNEE ARTHROPLASTY Right 10/14/2016   Procedure: RIGHT TOTAL KNEE ARTHROPLASTY;  Surgeon: Gaynelle Arabian, MD;  [facility];  Service: Orthopedics;  Laterality: Right;  Adductor Block  . TOTAL KNEE ARTHROPLASTY Left 04/24/2020   Procedure: TOTAL KNEE ARTHROPLASTY;  Surgeon: Gaynelle Arabian, MD;  [facility];  Service: Orthopedics;  Laterality: Left;  62min    There were no vitals filed for this visit.   Subjective Assessment - 05/12/20 0841    Subjective Patient states she is not an early person. She is having a lot pain still and is having trouble sleeping.  Pertinent History LT TKA 04/24/20    Currently in Pain? Yes    Pain Score 7     Pain Location Knee    Pain Orientation Left    Pain Onset 1 to 4 weeks ago                             [facility] Adult PT Treatment/Exercise - 05/12/20 0001      Knee/Hip Exercises: Stretches   Gastroc Stretch 3 reps;20 seconds    Gastroc Stretch Limitations slant board      Knee/Hip Exercises: Aerobic   Stationary Bike rocking back and forth on bike for ROM and dynamic warm up. 5 minutes      Knee/Hip Exercises: Standing   Heel Raises Both;1 set;20 reps    Heel Raises Limitations toe raises x20     Hip Abduction Both;1 set;Knee straight;15 reps      Knee/Hip Exercises: Seated   Long Arc Quad AROM;Left;2 sets;10 reps    Long [employer] Limitations 5 second holds    Other Seated Knee/Hip Exercises ankle pumps  x 15      Knee/Hip Exercises: Supine   Knee Extension AROM;Left    Knee Extension Limitations 10    Knee Flexion Left;AROM    Knee Flexion Limitations 91                  PT Education - 05/12/20 0839    Education Details educated on HEP, exercise mechanics, heel prop, importance of HEP, elevation for edema    Person(s) Educated Patient    Methods Explanation;Demonstration    Comprehension Verbalized understanding;Returned demonstration            PT Short Term Goals - 05/03/20 1148      PT SHORT TERM GOAL #1   Title Patient will be independent with initial HEP and self-management strategies to improve functional outcomes    Time 2    Period Weeks    Status On-going    Target Date 05/26/20      PT SHORT TERM GOAL #2   Title Patient will have LT knee AROM 5-90 degrees to improve functional mobility and facilitate squatting to pick up items from floor.    Time 3    Period Weeks    Status On-going    Target Date 05/26/20             PT Long Term Goals - 05/03/20 1148      PT LONG TERM GOAL #1   Title Patient will have LT knee AROM 0-120 degrees to improve functional mobility and facilitate squatting to pick up items from floor.    Time 6    Period Weeks    Status On-going      PT LONG TERM GOAL #2   Title Patient will be able to perform stand x 5 in < 15 seconds to demonstrate improvement in functional mobility and reduced risk for falls.    Time 6    Period Weeks    Status On-going      PT LONG TERM GOAL #3   Title Patient will improve FOTO score by 20% to indicate improvement in functional outcomes    Time 6    Period Weeks    Status On-going      PT LONG TERM GOAL #4   Title Patient will be able to ambulate at least 300 feet during 2MWT with LRAD to demonstrate improved  ability to perform functional mobility and associated tasks.    Time 6    Period Weeks    Status On-going                 Plan - 05/12/20 0840    Clinical Impression  Statement Patient arrives late for session and she forgot her cane so she is ambulating with small hand truck. She takes pain medicine at beginning of session and is given food to avoid feeling sick. Patient completes bike for dynamic warm up and improving knee ROM. Patient shows improving knee flexion ROM today to 91 degrees but remains limited with TKE. She is limited by pain today and c/o tightness. She requires several seated rest breaks due to dizziness and fatigue. Patient will continue to benefit from skilled physical therapy in order to reduce impairment and improve function.    Examination-Activity Limitations Locomotion Level;Lift;Transfers;Stand;Carry;Stairs;Bend;Squat;Sleep    Examination-Participation Restrictions Community Activity;Laundry;Yard Work;Cleaning    Stability/Clinical Decision Making Stable/Uncomplicated    Rehab Potential Good    PT Frequency 3x / week    PT Duration 6 weeks    PT Treatment/Interventions ADLs/Self Care Home Management;Aquatic Therapy;Biofeedback;Cryotherapy;Electrical Stimulation;Therapeutic activities;Iontophoresis 4mg /ml Dexamethasone;Moist Heat;Traction;Balance training;Manual techniques;Manual lymph drainage;Therapeutic exercise;Taping;Vasopneumatic Device;Vestibular;Splinting;Functional mobility training;Energy conservation;Orthotic Fit/Training;Stair training;Gait training;DME Instruction;Passive range of motion;Patient/family education;Dry needling;Joint Manipulations;Spinal Manipulations;Compression bandaging;Scar mobilization;Neuromuscular re-education;Fluidtherapy;Contrast Bath;Parrafin;Ultrasound    PT Next Visit Plan Progress knee strength and mobility on mat. Transition to standing functional strength, gait and balance when ready. Manual PRN for pain, restriciton and edema    PT Home Exercise Plan 05/01/20: ankle pump, quad set, glute set, heel slide; 10/25 LAQs 05/10/20: sit to stand, heel raise    Consulted and Agree with Plan of Care Patient             Patient will benefit from skilled therapeutic intervention in order to improve the following deficits and impairments:  Abnormal gait, Increased edema, Decreased scar mobility, Decreased activity tolerance, Decreased balance, Decreased mobility, Difficulty walking, Pain, Increased fascial restricitons, Decreased strength, Decreased range of motion, Impaired flexibility, Improper body mechanics  Visit Diagnosis: Left knee pain, unspecified chronicity  Stiffness of left knee, not elsewhere classified  Other abnormalities of gait and mobility     Problem List Patient Active Problem List   Diagnosis Date Noted  . Primary osteoarthritis of right knee 04/24/2020  . Endometrial polyp 03/19/2020  . Fibroids, intramural 11/12/2019  . Hypertension   . Hypercholesteremia   . History of kidney stones   . Diabetes mellitus without complication ([place])   . Colon polyps   . OA (osteoarthritis) of knee 10/14/2016   9:14 AM, 05/12/20 Mearl Latin PT, DPT Physical Therapist at [place], [place], [address] Phone: [phone]   Fax:  [phone]  Name: MARGART ZEMANEK MRN: [id number] Date of Birth: June 11, 1951

## 2020-05-15 ENCOUNTER — Telehealth (HOSPITAL_COMMUNITY): Payer: Self-pay | Admitting: Physical Therapy

## 2020-05-15 ENCOUNTER — Ambulatory Visit (HOSPITAL_COMMUNITY): Payer: Medicare HMO | Admitting: Physical Therapy

## 2020-05-15 NOTE — Telephone Encounter (Signed)
pt have to take her grandson to an engagement at school

## 2020-05-17 ENCOUNTER — Encounter (HOSPITAL_COMMUNITY): Payer: Self-pay | Admitting: Physical Therapy

## 2020-05-17 ENCOUNTER — Other Ambulatory Visit: Payer: Self-pay

## 2020-05-17 ENCOUNTER — Ambulatory Visit (HOSPITAL_COMMUNITY): Payer: Medicare HMO | Attending: Student | Admitting: Physical Therapy

## 2020-05-17 DIAGNOSIS — M25562 Pain in left knee: Secondary | ICD-10-CM | POA: Diagnosis not present

## 2020-05-17 DIAGNOSIS — M25662 Stiffness of left knee, not elsewhere classified: Secondary | ICD-10-CM | POA: Diagnosis not present

## 2020-05-17 DIAGNOSIS — E7800 Pure hypercholesterolemia, unspecified: Secondary | ICD-10-CM | POA: Diagnosis not present

## 2020-05-17 DIAGNOSIS — R2689 Other abnormalities of gait and mobility: Secondary | ICD-10-CM | POA: Insufficient documentation

## 2020-05-17 DIAGNOSIS — E1129 Type 2 diabetes mellitus with other diabetic kidney complication: Secondary | ICD-10-CM | POA: Diagnosis not present

## 2020-05-17 NOTE — Therapy (Signed)
[place], [place], [address] Phone: [phone]   Fax:  [phone]  Physical Therapy Treatment  Patient Details  Name: Amy Reyes MRN: [id number] Date of Birth: 1951/04/06 Referring Provider (PT): Theresa Duty Pa-C    Encounter Date: 05/17/2020   PT End of Session - 05/17/20 1111    Visit Number 6    Number of Visits 18    Date for PT Re-Evaluation 06/16/20    Authorization Type [employer]    Authorization Time Period approved 12 vits from 10/18-11/19/21    Authorization - Visit Number 6    Authorization - Number of Visits 12    PT Start Time 5465    PT Stop Time 1130    PT Time Calculation (min) 39 min    Activity Tolerance Patient limited by pain;Patient tolerated treatment well    Behavior During Therapy [facility] for tasks assessed/performed           Past Medical History:  Diagnosis Date  . Allergy    seasonal allergies  . Anemia    iron defiencey  . Arthritis    oa  . Bronchitis hx of  . Chronic kidney disease    ckd stage 2- 3 due to NSAIDS use no nephrologist  . Colon polyps   . Complication of anesthesia    slow to wake up one time, none recent  . Diabetes mellitus without complication ([place])    borderline type 2 on metformin  . GERD (gastroesophageal reflux disease)    on meds  . History of carpal tunnel syndrome    years ago  . History of kidney stones   . History of rotator cuff tear    Bilateral  . Hypercholesteremia    on meds  . Hypertension    on meds  . Peptic ulcer   . PMB (postmenopausal bleeding)    last time middle of april 2021    Past Surgical History:  Procedure Laterality Date  . ABDOMINOPLASTY  30 yrs ago   tummy tuck  . BREAST SURGERY     reduction  . CHOLECYSTECTOMY  2000   laparoscopic  . COLONOSCOPY  2019   TA  . DILATATION & CURETTAGE/HYSTEROSCOPY WITH MYOSURE N/A 12/20/2019   Procedure: DILATATION & CURETTAGE/HYSTEROSCOPY WITH MYOSURE  RESECTION OF POLYP;  Surgeon: Megan Salon, MD;  Location: [facility];  Service: Gynecology;  Laterality: N/A;  resection of polyp,  to follow first case  . INJECTION KNEE Left 10/14/2016   Procedure: CORTISONE LEFT KNEE INJECTION;  Surgeon: Gaynelle Arabian, MD;  Location: [facility];  Service: Orthopedics;  Laterality: Left;  . LAPAROSCOPIC GASTRIC BANDING  2010 or 2011  . left knee injection  6 weeks ago  . POLYPECTOMY     TA  . ROTATOR CUFF REPAIR Left 05/2019  . TOTAL KNEE ARTHROPLASTY Right 10/14/2016   Procedure: RIGHT TOTAL KNEE ARTHROPLASTY;  Surgeon: Gaynelle Arabian, MD;  Location: [facility];  Service: Orthopedics;  Laterality: Right;  Adductor Block  . TOTAL KNEE ARTHROPLASTY Left 04/24/2020   Procedure: TOTAL KNEE ARTHROPLASTY;  Surgeon: Gaynelle Arabian, MD;  Location: [facility];  Service: Orthopedics;  Laterality: Left;  26min    There were no vitals filed for this visit.   Subjective Assessment - 05/17/20 1052    Subjective Pt states that she has been doing some her exercises at home.    Pertinent History LT TKA 04/24/20    Limitations Sitting;Standing;Lifting;Walking;House hold  activities    Patient Stated Goals do better than I was doing, get back into gym    Currently in Pain? Yes    Pain Score 6     Pain Location Knee    Pain Orientation Left    Pain Descriptors / Indicators Aching    Pain Onset In the past 7 days    Pain Frequency Intermittent    Aggravating Factors  straightening her leg    Pain Relieving Factors rest and meds    Effect of Pain on Daily Activities limits                             [facility] Adult PT Treatment/Exercise - 05/17/20 0001      Exercises   Exercises Knee/Hip      Knee/Hip Exercises: Stretches   Passive Hamstring Stretch Left;3 reps;20 seconds    Knee: Self-Stretch to increase Flexion Left;5 reps    Gastroc Stretch 3 reps;20 seconds    Gastroc Stretch Limitations slant board      Knee/Hip Exercises: Standing    Heel Raises 10 reps    Heel Raises Limitations toe raise x 10    Knee Flexion Left;10 reps    Terminal Knee Extension Strengthening;Left;10 reps    Rocker Board 2 minutes      Knee/Hip Exercises: Supine   Quad Sets Left;10 reps    Knee Extension Limitations 7    Knee Flexion Limitations 100           manual to decrease swelling x 7'         PT Short Term Goals - 05/03/20 1148      PT SHORT TERM GOAL #1   Title Patient will be independent with initial HEP and self-management strategies to improve functional outcomes    Time 2    Period Weeks    Status On-going    Target Date 05/26/20      PT SHORT TERM GOAL #2   Title Patient will have LT knee AROM 5-90 degrees to improve functional mobility and facilitate squatting to pick up items from floor.    Time 3    Period Weeks    Status On-going    Target Date 05/26/20             PT Long Term Goals - 05/03/20 1148      PT LONG TERM GOAL #1   Title Patient will have LT knee AROM 0-120 degrees to improve functional mobility and facilitate squatting to pick up items from floor.    Time 6    Period Weeks    Status On-going      PT LONG TERM GOAL #2   Title Patient will be able to perform stand x 5 in < 15 seconds to demonstrate improvement in functional mobility and reduced risk for falls.    Time 6    Period Weeks    Status On-going      PT LONG TERM GOAL #3   Title Patient will improve FOTO score by 20% to indicate improvement in functional outcomes    Time 6    Period Weeks    Status On-going      PT LONG TERM GOAL #4   Title Patient will be able to ambulate at least 300 feet during 2MWT with LRAD to demonstrate improved ability to perform functional mobility and associated tasks.    Time 6    Period Weeks  Status On-going                 Plan - 05/17/20 1112    Clinical Impression Statement Pt arrives late for appointment.  Treatment focused on improving ROM with therapist adding several new  exercises to try and improve ROM as this should be our primary focus at this time.  PT had fewer rest breaks today.    Examination-Activity Limitations Locomotion Level;Lift;Transfers;Stand;Carry;Stairs;Bend;Squat;Sleep    Examination-Participation Restrictions Community Activity;Laundry;Yard Work;Cleaning    Stability/Clinical Decision Making Stable/Uncomplicated    Rehab Potential Good    PT Frequency 3x / week    PT Duration 6 weeks    PT Treatment/Interventions ADLs/Self Care Home Management;Aquatic Therapy;Biofeedback;Cryotherapy;Electrical Stimulation;Therapeutic activities;Iontophoresis 4mg /ml Dexamethasone;Moist Heat;Traction;Balance training;Manual techniques;Manual lymph drainage;Therapeutic exercise;Taping;Vasopneumatic Device;Vestibular;Splinting;Functional mobility training;Energy conservation;Orthotic Fit/Training;Stair training;Gait training;DME Instruction;Passive range of motion;Patient/family education;Dry needling;Joint Manipulations;Spinal Manipulations;Compression bandaging;Scar mobilization;Neuromuscular re-education;Fluidtherapy;Contrast Bath;Parrafin;Ultrasound    PT Next Visit Plan Progress knee  ROM over strength and mobility on mat. Transition to standing functional strength, gait and balance when ready. Manual PRN for pain, restriciton and edema    PT Home Exercise Plan 05/01/20: ankle pump, quad set, glute set, heel slide; 10/25 LAQs 05/10/20: sit to stand, heel raise    Consulted and Agree with Plan of Care Patient           Patient will benefit from skilled therapeutic intervention in order to improve the following deficits and impairments:  Abnormal gait, Increased edema, Decreased scar mobility, Decreased activity tolerance, Decreased balance, Decreased mobility, Difficulty walking, Pain, Increased fascial restricitons, Decreased strength, Decreased range of motion, Impaired flexibility, Improper body mechanics  Visit Diagnosis: Left knee pain, unspecified  chronicity  Stiffness of left knee, not elsewhere classified  Other abnormalities of gait and mobility     Problem List Patient Active Problem List   Diagnosis Date Noted  . Primary osteoarthritis of right knee 04/24/2020  . Endometrial polyp 03/19/2020  . Fibroids, intramural 11/12/2019  . Hypertension   . Hypercholesteremia   . History of kidney stones   . Diabetes mellitus without complication ([place])   . Colon polyps   . OA (osteoarthritis) of knee 10/14/2016    Rayetta Humphrey, PT CLT [phone] 05/17/2020, 11:31 AM  [place] [address] [place], [place], [address] Phone: [phone]   Fax:  [phone]  Name: Amy Reyes MRN: [id number] Date of Birth: [birth year]

## 2020-05-19 ENCOUNTER — Other Ambulatory Visit: Payer: Self-pay

## 2020-05-19 ENCOUNTER — Ambulatory Visit (HOSPITAL_COMMUNITY): Payer: Medicare HMO

## 2020-05-19 ENCOUNTER — Encounter (HOSPITAL_COMMUNITY): Payer: Self-pay

## 2020-05-19 DIAGNOSIS — R2689 Other abnormalities of gait and mobility: Secondary | ICD-10-CM | POA: Diagnosis not present

## 2020-05-19 DIAGNOSIS — M25662 Stiffness of left knee, not elsewhere classified: Secondary | ICD-10-CM

## 2020-05-19 DIAGNOSIS — M25562 Pain in left knee: Secondary | ICD-10-CM | POA: Diagnosis not present

## 2020-05-19 NOTE — Therapy (Signed)
[place], [place], [address] Phone: [phone]   Fax:  [phone]  Physical Therapy Treatment  Patient Details  Name: Amy Reyes MRN: [id number] Date of Birth: 02/14/1951 Referring Provider (PT): Theresa Duty Pa-C    Encounter Date: 05/19/2020   PT End of Session - 05/19/20 1056    Visit Number 7    Number of Visits 18    Date for PT Re-Evaluation 06/16/20    Authorization Type [employer]    Authorization Time Period approved 12 vits from 10/18-11/19/21    Authorization - Visit Number 7    Authorization - Number of Visits 12    Progress Note Due on Visit 10    PT Start Time 1050   3' on bike for mobility, not included with charges   PT Stop Time 1143   10' at EOS with ice on knee   PT Time Calculation (min) 53 min    Activity Tolerance Patient limited by pain;Patient tolerated treatment well    Behavior During Therapy [facility] for tasks assessed/performed           Past Medical History:  Diagnosis Date  . Allergy    seasonal allergies  . Anemia    iron defiencey  . Arthritis    oa  . Bronchitis hx of  . Chronic kidney disease    ckd stage 2- 3 due to NSAIDS use no nephrologist  . Colon polyps   . Complication of anesthesia    slow to wake up one time, none recent  . Diabetes mellitus without complication ([place])    borderline type 2 on metformin  . GERD (gastroesophageal reflux disease)    on meds  . History of carpal tunnel syndrome    years ago  . History of kidney stones   . History of rotator cuff tear    Bilateral  . Hypercholesteremia    on meds  . Hypertension    on meds  . Peptic ulcer   . PMB (postmenopausal bleeding)    last time middle of april 2021    Past Surgical History:  Procedure Laterality Date  . ABDOMINOPLASTY  30 yrs ago   tummy tuck  . BREAST SURGERY     reduction  . CHOLECYSTECTOMY  2000   laparoscopic  . COLONOSCOPY  2019   TA  . DILATATION  & CURETTAGE/HYSTEROSCOPY WITH MYOSURE N/A 12/20/2019   Procedure: DILATATION & CURETTAGE/HYSTEROSCOPY WITH MYOSURE RESECTION OF POLYP;  Surgeon: Megan Salon, MD;  Location: [facility];  Service: Gynecology;  Laterality: N/A;  resection of polyp,  to follow first case  . INJECTION KNEE Left 10/14/2016   Procedure: CORTISONE LEFT KNEE INJECTION;  Surgeon: Gaynelle Arabian, MD;  Location[facility];  Service: Orthopedics;  Laterality: Left;  . LAPAROSCOPIC GASTRIC BANDING  2010 or 2011  . left knee injection  6 weeks ago  . POLYPECTOMY     TA  . ROTATOR CUFF REPAIR Left 05/2019  . TOTAL KNEE ARTHROPLASTY Right 10/14/2016   Procedure: RIGHT TOTAL KNEE ARTHROPLASTY;  Surgeon: Gaynelle Arabian, MD;  [facility];  Service: Orthopedics;  Laterality: Right;  Adductor Block  . TOTAL KNEE ARTHROPLASTY Left 04/24/2020   Procedure: TOTAL KNEE ARTHROPLASTY;  Surgeon: Gaynelle Arabian, MD;  Location[facility];  Service: Orthopedics;  Laterality: Left;  51min    There were no vitals filed for this visit.   Subjective Assessment - 05/19/20 1052  Subjective Pt reports she is feeling better.  has been compliance with HEP    Pertinent History LT TKA 04/24/20    Patient Stated Goals do better than I was doing, get back into gym    Currently in Pain? Yes    Pain Score 5     Pain Location Knee    Pain Orientation Left    Pain Descriptors / Indicators Sore    Pain Type Surgical pain    Pain Onset In the past 7 days    Pain Frequency Intermittent    Aggravating Factors  straightening her leg    Pain Relieving Factors rest and meds    Effect of Pain on Daily Activities limits                             [facility] Adult PT Treatment/Exercise - 05/19/20 0001      Exercises   Exercises Knee/Hip      Knee/Hip Exercises: Stretches   Active Hamstring Stretch Left;3 reps;30 seconds    Active Hamstring Stretch Limitations 12in step height    Knee: Self-Stretch to increase Flexion Left;5  reps;10 seconds    Knee: Self-Stretch Limitations knee drive on 49QP step    Gastroc Stretch 3 reps;30 seconds    Gastroc Stretch Limitations slant board      Knee/Hip Exercises: Aerobic   Stationary Bike rocking then full revolution backwards then forwards 5x seat 16 3'      Knee/Hip Exercises: Standing   Heel Raises 10 reps    Heel Raises Limitations toe raise x 10 incline slope, cueing for     Terminal Knee Extension Strengthening;Left;10 reps    Terminal Knee Extension Limitations pink ball 5" holds    Rocker Board 2 minutes    Rocker Board Limitations lateral and DF/PF      Knee/Hip Exercises: Supine   Quad Sets Left;10 reps    Heel Slides Left;10 reps    Knee Extension AROM;Left    Knee Extension Limitations 5    Knee Flexion Left;AROM    Knee Flexion Limitations 104      Manual Therapy   Manual Therapy Edema management;Myofascial release    Manual therapy comments completed independently from all other aspects of treatment    Edema Management retrograde massage for edema with wedge for pain/edema at end of session    Myofascial Release scar tissue massage                    PT Short Term Goals - 05/03/20 1148      PT SHORT TERM GOAL #1   Title Patient will be independent with initial HEP and self-management strategies to improve functional outcomes    Time 2    Period Weeks    Status On-going    Target Date 05/26/20      PT SHORT TERM GOAL #2   Title Patient will have LT knee AROM 5-90 degrees to improve functional mobility and facilitate squatting to pick up items from floor.    Time 3    Period Weeks    Status On-going    Target Date 05/26/20             PT Long Term Goals - 05/03/20 1148      PT LONG TERM GOAL #1   Title Patient will have LT knee AROM 0-120 degrees to improve functional mobility and facilitate squatting to pick up items from  floor.    Time 6    Period Weeks    Status On-going      PT LONG TERM GOAL #2   Title Patient  will be able to perform stand x 5 in < 15 seconds to demonstrate improvement in functional mobility and reduced risk for falls.    Time 6    Period Weeks    Status On-going      PT LONG TERM GOAL #3   Title Patient will improve FOTO score by 20% to indicate improvement in functional outcomes    Time 6    Period Weeks    Status On-going      PT LONG TERM GOAL #4   Title Patient will be able to ambulate at least 300 feet during 2MWT with LRAD to demonstrate improved ability to perform functional mobility and associated tasks.    Time 6    Period Weeks    Status On-going                 Plan - 05/19/20 1326    Clinical Impression Statement Treatment session on improving ROM.  Added scar tissue massage and continued retrograde massage for edema control proximal knee.  Cueing to improve quad contraction hold times.  AROM improved to 5-104.  Reports slight increased pain at EOS, ice applied for pain control.    Examination-Activity Limitations Locomotion Level;Lift;Transfers;Stand;Carry;Stairs;Bend;Squat;Sleep    Examination-Participation Restrictions Community Activity;Laundry;Yard Work;Cleaning    Stability/Clinical Decision Making Stable/Uncomplicated    Clinical Decision Making Low    Rehab Potential Good    PT Frequency 3x / week    PT Duration 6 weeks    PT Treatment/Interventions ADLs/Self Care Home Management;Aquatic Therapy;Biofeedback;Cryotherapy;Electrical Stimulation;Therapeutic activities;Iontophoresis 4mg /ml Dexamethasone;Moist Heat;Traction;Balance training;Manual techniques;Manual lymph drainage;Therapeutic exercise;Taping;Vasopneumatic Device;Vestibular;Splinting;Functional mobility training;Energy conservation;Orthotic Fit/Training;Stair training;Gait training;DME Instruction;Passive range of motion;Patient/family education;Dry needling;Joint Manipulations;Spinal Manipulations;Compression bandaging;Scar mobilization;Neuromuscular re-education;Fluidtherapy;Contrast  Bath;Parrafin;Ultrasound    PT Next Visit Plan Progress knee  ROM over strength and mobility on mat. Transition to standing functional strength, gait and balance when ready. Manual PRN for pain, restriciton and edema    PT Home Exercise Plan 05/01/20: ankle pump, quad set, glute set, heel slide; 10/25 LAQs 05/10/20: sit to stand, heel raise           Patient will benefit from skilled therapeutic intervention in order to improve the following deficits and impairments:  Abnormal gait, Increased edema, Decreased scar mobility, Decreased activity tolerance, Decreased balance, Decreased mobility, Difficulty walking, Pain, Increased fascial restricitons, Decreased strength, Decreased range of motion, Impaired flexibility, Improper body mechanics  Visit Diagnosis: Left knee pain, unspecified chronicity  Stiffness of left knee, not elsewhere classified  Other abnormalities of gait and mobility     Problem List Patient Active Problem List   Diagnosis Date Noted  . Primary osteoarthritis of right knee 04/24/2020  . Endometrial polyp 03/19/2020  . Fibroids, intramural 11/12/2019  . Hypertension   . Hypercholesteremia   . History of kidney stones   . Diabetes mellitus without complication ([place])   . Colon polyps   . OA (osteoarthritis) of knee 10/14/2016   Ihor Austin, LPTA/CLT; CBIS [phone]  Aldona Lento 05/19/2020, 1:30 PM  [facility] [place], [place], [address] Phone: [phone]   Fax:  [phone]  Name: Amy Reyes MRN: [id number] Date of Birth: [birth year]

## 2020-05-22 ENCOUNTER — Encounter (HOSPITAL_COMMUNITY): Payer: Self-pay | Admitting: Physical Therapy

## 2020-05-22 ENCOUNTER — Other Ambulatory Visit: Payer: Self-pay

## 2020-05-22 ENCOUNTER — Ambulatory Visit (HOSPITAL_COMMUNITY): Payer: Medicare HMO | Admitting: Physical Therapy

## 2020-05-22 DIAGNOSIS — M25662 Stiffness of left knee, not elsewhere classified: Secondary | ICD-10-CM | POA: Diagnosis not present

## 2020-05-22 DIAGNOSIS — M25562 Pain in left knee: Secondary | ICD-10-CM | POA: Diagnosis not present

## 2020-05-22 DIAGNOSIS — R2689 Other abnormalities of gait and mobility: Secondary | ICD-10-CM

## 2020-05-22 NOTE — Therapy (Signed)
[place], [place], [address] Phone: [phone]   Fax:  [phone]  Physical Therapy Treatment  Patient Details  Name: Amy Reyes MRN: [id number] Date of Birth: Feb 27, 1951 Referring Provider (PT): Theresa Duty Pa-C    Encounter Date: 05/22/2020   PT End of Session - 05/22/20 1039    Visit Number 8    Number of Visits 18    Date for PT Re-Evaluation 06/16/20    Authorization Type [employer]    Authorization Time Period approved 12 vits from 10/18-11/19/21    Authorization - Visit Number 8    Authorization - Number of Visits 12    Progress Note Due on Visit 10    PT Start Time 1034    PT Stop Time 1118    PT Time Calculation (min) 44 min    Activity Tolerance Patient limited by pain;Patient tolerated treatment well    Behavior During Therapy [facility] for tasks assessed/performed           Past Medical History:  Diagnosis Date  . Allergy    seasonal allergies  . Anemia    iron defiencey  . Arthritis    oa  . Bronchitis hx of  . Chronic kidney disease    ckd stage 2- 3 due to NSAIDS use no nephrologist  . Colon polyps   . Complication of anesthesia    slow to wake up one time, none recent  . Diabetes mellitus without complication ([place])    borderline type 2 on metformin  . GERD (gastroesophageal reflux disease)    on meds  . History of carpal tunnel syndrome    years ago  . History of kidney stones   . History of rotator cuff tear    Bilateral  . Hypercholesteremia    on meds  . Hypertension    on meds  . Peptic ulcer   . PMB (postmenopausal bleeding)    last time middle of april 2021    Past Surgical History:  Procedure Laterality Date  . ABDOMINOPLASTY  30 yrs ago   tummy tuck  . BREAST SURGERY     reduction  . CHOLECYSTECTOMY  2000   laparoscopic  . COLONOSCOPY  2019   TA  . DILATATION & CURETTAGE/HYSTEROSCOPY WITH MYOSURE N/A 12/20/2019   Procedure: DILATATION &  CURETTAGE/HYSTEROSCOPY WITH MYOSURE RESECTION OF POLYP;  Surgeon: Megan Salon, MD;  Location: [facility];  Service: Gynecology;  Laterality: N/A;  resection of polyp,  to follow first case  . INJECTION KNEE Left 10/14/2016   Procedure: CORTISONE LEFT KNEE INJECTION;  Surgeon: Gaynelle Arabian, MD;  Location: [facility];  Service: Orthopedics;  Laterality: Left;  . LAPAROSCOPIC GASTRIC BANDING  2010 or 2011  . left knee injection  6 weeks ago  . POLYPECTOMY     TA  . ROTATOR CUFF REPAIR Left 05/2019  . TOTAL KNEE ARTHROPLASTY Right 10/14/2016   Procedure: RIGHT TOTAL KNEE ARTHROPLASTY;  Surgeon: Gaynelle Arabian, MD;  Location: [facility];  Service: Orthopedics;  Laterality: Right;  Adductor Block  . TOTAL KNEE ARTHROPLASTY Left 04/24/2020   Procedure: TOTAL KNEE ARTHROPLASTY;  Surgeon: Gaynelle Arabian, MD;  Location: [facility];  Service: Orthopedics;  Laterality: Left;  65min    There were no vitals filed for this visit.   Subjective Assessment - 05/22/20 1039    Subjective Patient notes improved mobility in knee overall. Says she was doing well, but is very stiff this  morning. Stiffened up over the weekend.    Pertinent History LT TKA 04/24/20    Patient Stated Goals do better than I was doing, get back into gym    Currently in Pain? Yes    Pain Score 5     Pain Location Knee    Pain Orientation Left    Pain Descriptors / Indicators Sore    Pain Type Surgical pain    Pain Onset In the past 7 days                             [facility] Adult PT Treatment/Exercise - 05/22/20 0001      Knee/Hip Exercises: Stretches   Knee: Self-Stretch to increase Flexion Left;5 reps;10 seconds    Knee: Self-Stretch Limitations knee drive on 2nd step at stairs     Gastroc Stretch 3 reps;30 seconds    Gastroc Stretch Limitations slant board      Knee/Hip Exercises: Aerobic   Recumbent Bike 4' seat 12 initial rocking, to full revolution dynamic warmup        Knee/Hip Exercises: Standing    Heel Raises Both;2 sets;10 reps    Heel Raises Limitations toe raise x 15    Forward Step Up Left;1 set;15 reps;Hand Hold: 1;Step Height: 4"    Step Down Left;1 set;10 reps;Hand Hold: 1;Step Height: 4"    SLS 3 x 15" each on solid floor, (30" max on RT, 15" on left     Gait Training 350 in clinic, no AD, cues for even stride, heel strike      Knee/Hip Exercises: Seated   Sit to Sand 1 set;10 reps;without UE support      Knee/Hip Exercises: Supine   Heel Slides Left;5 sets;AROM    Heel Slides Limitations 2 with AAROM     Knee Extension AROM;Left    Knee Extension Limitations 5    Knee Flexion Left;AROM    Knee Flexion Limitations 100      Manual Therapy   Manual Therapy Edema management    Manual therapy comments completed independently from all other aspects of treatment    Edema Management retrograde massage for edema with wedge for pain/edema at end of session                    PT Short Term Goals - 05/03/20 1148      PT SHORT TERM GOAL #1   Title Patient will be independent with initial HEP and self-management strategies to improve functional outcomes    Time 2    Period Weeks    Status On-going    Target Date 05/26/20      PT SHORT TERM GOAL #2   Title Patient will have LT knee AROM 5-90 degrees to improve functional mobility and facilitate squatting to pick up items from floor.    Time 3    Period Weeks    Status On-going    Target Date 05/26/20             PT Long Term Goals - 05/03/20 1148      PT LONG TERM GOAL #1   Title Patient will have LT knee AROM 0-120 degrees to improve functional mobility and facilitate squatting to pick up items from floor.    Time 6    Period Weeks    Status On-going      PT LONG TERM GOAL #2   Title Patient will be able  to perform stand x 5 in < 15 seconds to demonstrate improvement in functional mobility and reduced risk for falls.    Time 6    Period Weeks    Status On-going      PT LONG TERM GOAL #3    Title Patient will improve FOTO score by 20% to indicate improvement in functional outcomes    Time 6    Period Weeks    Status On-going      PT LONG TERM GOAL #4   Title Patient will be able to ambulate at least 300 feet during 2MWT with LRAD to demonstrate improved ability to perform functional mobility and associated tasks.    Time 6    Period Weeks    Status On-going                 Plan - 05/22/20 1130    Clinical Impression Statement Patient shows improvement in functional mobility and improved gait mechanics with verbal cues for symmetry and heel strike. Patient able to progress functional strengthening to include step up and down on box step today. Patient continues to be pain limited with mobility and does not tolerate stretching well, especially knee flexion. Patient instructed to spend more time adjusting to AAROM heel slides at home to improve this. Otherwise patient is progressing well toward goals. Patient will continue to benefit from skilled therapy to progress strength an mobility to reduce pain and improve functional mobility.    Examination-Activity Limitations Locomotion Level;Lift;Transfers;Stand;Carry;Stairs;Bend;Squat;Sleep    Examination-Participation Restrictions Community Activity;Laundry;Yard Work;Cleaning    Stability/Clinical Decision Making Stable/Uncomplicated    Rehab Potential Good    PT Frequency 3x / week    PT Duration 6 weeks    PT Treatment/Interventions ADLs/Self Care Home Management;Aquatic Therapy;Biofeedback;Cryotherapy;Electrical Stimulation;Therapeutic activities;Iontophoresis 4mg /ml Dexamethasone;Moist Heat;Traction;Balance training;Manual techniques;Manual lymph drainage;Therapeutic exercise;Taping;Vasopneumatic Device;Vestibular;Splinting;Functional mobility training;Energy conservation;Orthotic Fit/Training;Stair training;Gait training;DME Instruction;Passive range of motion;Patient/family education;Dry needling;Joint Manipulations;Spinal  Manipulations;Compression bandaging;Scar mobilization;Neuromuscular re-education;Fluidtherapy;Contrast Bath;Parrafin;Ultrasound    PT Next Visit Plan Continue to progress knee AROM as able. Trial contract relax technique if needed. Progress functional strength, gait and balance as tolerated. Increase step height next visit.    PT Home Exercise Plan 05/01/20: ankle pump, quad set, glute set, heel slide; 10/25 LAQs 05/10/20: sit to stand, heel raise    Consulted and Agree with Plan of Care Patient           Patient will benefit from skilled therapeutic intervention in order to improve the following deficits and impairments:  Abnormal gait, Increased edema, Decreased scar mobility, Decreased activity tolerance, Decreased balance, Decreased mobility, Difficulty walking, Pain, Increased fascial restricitons, Decreased strength, Decreased range of motion, Impaired flexibility, Improper body mechanics  Visit Diagnosis: Left knee pain, unspecified chronicity  Stiffness of left knee, not elsewhere classified  Other abnormalities of gait and mobility     Problem List Patient Active Problem List   Diagnosis Date Noted  . Primary osteoarthritis of right knee 04/24/2020  . Endometrial polyp 03/19/2020  . Fibroids, intramural 11/12/2019  . Hypertension   . Hypercholesteremia   . History of kidney stones   . Diabetes mellitus without complication ([place])   . Colon polyps   . OA (osteoarthritis) of knee 10/14/2016    11:39 AM, 05/22/20 Josue Hector PT DPT  Physical Therapist with [facility]  [phone] [address] [place], [place], [address] Phone: [phone]   Fax:  [phone]  Name: Reesha  SIMMIE GARIN MRN: [id number] Date of Birth: 23-May-1951

## 2020-05-24 ENCOUNTER — Ambulatory Visit (HOSPITAL_COMMUNITY): Payer: Medicare HMO

## 2020-05-24 DIAGNOSIS — K449 Diaphragmatic hernia without obstruction or gangrene: Secondary | ICD-10-CM | POA: Diagnosis not present

## 2020-05-24 DIAGNOSIS — N1832 Chronic kidney disease, stage 3b: Secondary | ICD-10-CM | POA: Diagnosis not present

## 2020-05-24 DIAGNOSIS — F321 Major depressive disorder, single episode, moderate: Secondary | ICD-10-CM | POA: Diagnosis not present

## 2020-05-24 DIAGNOSIS — Z0000 Encounter for general adult medical examination without abnormal findings: Secondary | ICD-10-CM | POA: Diagnosis not present

## 2020-05-24 DIAGNOSIS — D692 Other nonthrombocytopenic purpura: Secondary | ICD-10-CM | POA: Diagnosis not present

## 2020-05-24 DIAGNOSIS — I129 Hypertensive chronic kidney disease with stage 1 through stage 4 chronic kidney disease, or unspecified chronic kidney disease: Secondary | ICD-10-CM | POA: Diagnosis not present

## 2020-05-24 DIAGNOSIS — D631 Anemia in chronic kidney disease: Secondary | ICD-10-CM | POA: Diagnosis not present

## 2020-05-24 DIAGNOSIS — R82998 Other abnormal findings in urine: Secondary | ICD-10-CM | POA: Diagnosis not present

## 2020-05-24 DIAGNOSIS — E7800 Pure hypercholesterolemia, unspecified: Secondary | ICD-10-CM | POA: Diagnosis not present

## 2020-05-24 DIAGNOSIS — E1129 Type 2 diabetes mellitus with other diabetic kidney complication: Secondary | ICD-10-CM | POA: Diagnosis not present

## 2020-05-24 DIAGNOSIS — R809 Proteinuria, unspecified: Secondary | ICD-10-CM | POA: Diagnosis not present

## 2020-05-25 DIAGNOSIS — M25511 Pain in right shoulder: Secondary | ICD-10-CM | POA: Diagnosis not present

## 2020-05-26 ENCOUNTER — Encounter (HOSPITAL_COMMUNITY): Payer: Self-pay

## 2020-05-26 ENCOUNTER — Ambulatory Visit (HOSPITAL_COMMUNITY): Payer: Medicare HMO

## 2020-05-26 ENCOUNTER — Other Ambulatory Visit: Payer: Self-pay

## 2020-05-26 DIAGNOSIS — R2689 Other abnormalities of gait and mobility: Secondary | ICD-10-CM | POA: Diagnosis not present

## 2020-05-26 DIAGNOSIS — M25562 Pain in left knee: Secondary | ICD-10-CM

## 2020-05-26 DIAGNOSIS — M25662 Stiffness of left knee, not elsewhere classified: Secondary | ICD-10-CM | POA: Diagnosis not present

## 2020-05-26 NOTE — Therapy (Signed)
[place], [place], [address] Phone: [phone]   Fax:  [phone]  Physical Therapy Treatment  Patient Details  Name: Amy Reyes MRN: [id number] Date of Birth: 01/19/1951 Referring Provider (PT): Theresa Duty Pa-C    Encounter Date: 05/26/2020   PT End of Session - 05/26/20 1322    Visit Number 9    Number of Visits 18    Date for PT Re-Evaluation 06/16/20    Authorization Type [employer]    Authorization Time Period approved 12 vits from 10/18-11/19/21    Authorization - Visit Number 9    Authorization - Number of Visits 12    Progress Note Due on Visit 10    PT Start Time 6468    PT Stop Time 1401    PT Time Calculation (min) 44 min    Activity Tolerance Patient tolerated treatment well    Behavior During Therapy [facility] for tasks assessed/performed           Past Medical History:  Diagnosis Date  . Allergy    seasonal allergies  . Anemia    iron defiencey  . Arthritis    oa  . Bronchitis hx of  . Chronic kidney disease    ckd stage 2- 3 due to NSAIDS use no nephrologist  . Colon polyps   . Complication of anesthesia    slow to wake up one time, none recent  . Diabetes mellitus without complication ([place])    borderline type 2 on metformin  . GERD (gastroesophageal reflux disease)    on meds  . History of carpal tunnel syndrome    years ago  . History of kidney stones   . History of rotator cuff tear    Bilateral  . Hypercholesteremia    on meds  . Hypertension    on meds  . Peptic ulcer   . PMB (postmenopausal bleeding)    last time middle of april 2021    Past Surgical History:  Procedure Laterality Date  . ABDOMINOPLASTY  30 yrs ago   tummy tuck  . BREAST SURGERY     reduction  . CHOLECYSTECTOMY  2000   laparoscopic  . COLONOSCOPY  2019   TA  . DILATATION & CURETTAGE/HYSTEROSCOPY WITH MYOSURE N/A 12/20/2019   Procedure: DILATATION & CURETTAGE/HYSTEROSCOPY  WITH MYOSURE RESECTION OF POLYP;  Surgeon: Megan Salon, MD;  Location: [facility];  Service: Gynecology;  Laterality: N/A;  resection of polyp,  to follow first case  . INJECTION KNEE Left 10/14/2016   Procedure: CORTISONE LEFT KNEE INJECTION;  Surgeon: Gaynelle Arabian, MD;  [facility];  Service: Orthopedics;  Laterality: Left;  . LAPAROSCOPIC GASTRIC BANDING  2010 or 2011  . left knee injection  6 weeks ago  . POLYPECTOMY     TA  . ROTATOR CUFF REPAIR Left 05/2019  . TOTAL KNEE ARTHROPLASTY Right 10/14/2016   Procedure: RIGHT TOTAL KNEE ARTHROPLASTY;  Surgeon: Gaynelle Arabian, MD;  [facility];  Service: Orthopedics;  Laterality: Right;  Adductor Block  . TOTAL KNEE ARTHROPLASTY Left 04/24/2020   Procedure: TOTAL KNEE ARTHROPLASTY;  Surgeon: Gaynelle Arabian, MD;  [facility];  Service: Orthopedics;  Laterality: Left;  87min    There were no vitals filed for this visit.   Subjective Assessment - 05/26/20 1321    Subjective Pt stated she can tell knee is improving, was able to sleep for 6 hours last night.  Pertinent History LT TKA 04/24/20    Patient Stated Goals do better than I was doing, get back into gym    Currently in Pain? No/denies                             [facility] Adult PT Treatment/Exercise - 05/26/20 0001      Exercises   Exercises Knee/Hip      Knee/Hip Exercises: Stretches   Quad Stretch 3 reps;30 seconds    Quad Stretch Limitations prone wiht rope    Knee: Self-Stretch to increase Flexion Left;5 reps;10 seconds    Knee: Self-Stretch Limitations knee drive on 13KG step       Knee/Hip Exercises: Aerobic   Stationary Bike 4' initially rocking then full revolution seat 16      Knee/Hip Exercises: Standing   Functional Squat 2 sets;5 reps      Knee/Hip Exercises: Seated   Sit to Sand 1 set;10 reps;without UE support   eccentric control     Knee/Hip Exercises: Supine   Quad Sets Left;10 reps    Heel Slides Left;10  reps    Knee Extension AROM;Left    Knee Extension Limitations 3    Knee Flexion Left;AROM    Knee Flexion Limitations 107      Knee/Hip Exercises: Prone   Hamstring Curl 10 reps    Contract/Relax to Increase Flexion 5x 10"    Other Prone Exercises TKA 10x 5"      Manual Therapy   Manual Therapy Edema management;Myofascial release    Manual therapy comments completed independently from all other aspects of treatment    Edema Management retrograde massage for edema with wedge for pain/edema at end of session    Myofascial Release scar tissue massage                    PT Short Term Goals - 05/03/20 1148      PT SHORT TERM GOAL #1   Title Patient will be independent with initial HEP and self-management strategies to improve functional outcomes    Time 2    Period Weeks    Status On-going    Target Date 05/26/20      PT SHORT TERM GOAL #2   Title Patient will have LT knee AROM 5-90 degrees to improve functional mobility and facilitate squatting to pick up items from floor.    Time 3    Period Weeks    Status On-going    Target Date 05/26/20             PT Long Term Goals - 05/03/20 1148      PT LONG TERM GOAL #1   Title Patient will have LT knee AROM 0-120 degrees to improve functional mobility and facilitate squatting to pick up items from floor.    Time 6    Period Weeks    Status On-going      PT LONG TERM GOAL #2   Title Patient will be able to perform stand x 5 in < 15 seconds to demonstrate improvement in functional mobility and reduced risk for falls.    Time 6    Period Weeks    Status On-going      PT LONG TERM GOAL #3   Title Patient will improve FOTO score by 20% to indicate improvement in functional outcomes    Time 6    Period Weeks    Status On-going  PT LONG TERM GOAL #4   Title Patient will be able to ambulate at least 300 feet during 2MWT with LRAD to demonstrate improved ability to perform functional mobility and associated  tasks.    Time 6    Period Weeks    Status On-going                 Plan - 05/26/20 1337    Clinical Impression Statement Session focus with knee mobility and strengthening.  Added prone quad stretches and manual contract/relax to improve flexion and continued isometric quad strengthening exercises for strengthening to improve extension.  AROM improved to 3-107 degrees.  Pt presents with minimal edema medial aspect proximal knee.  No reports of pain at EOS.    Examination-Activity Limitations Locomotion Level;Lift;Transfers;Stand;Carry;Stairs;Bend;Squat;Sleep    Examination-Participation Restrictions Community Activity;Laundry;Yard Work;Cleaning    Stability/Clinical Decision Making Stable/Uncomplicated    Clinical Decision Making Low    Rehab Potential Good    PT Frequency 3x / week    PT Duration 6 weeks    PT Treatment/Interventions ADLs/Self Care Home Management;Aquatic Therapy;Biofeedback;Cryotherapy;Electrical Stimulation;Therapeutic activities;Iontophoresis 4mg /ml Dexamethasone;Moist Heat;Traction;Balance training;Manual techniques;Manual lymph drainage;Therapeutic exercise;Taping;Vasopneumatic Device;Vestibular;Splinting;Functional mobility training;Energy conservation;Orthotic Fit/Training;Stair training;Gait training;DME Instruction;Passive range of motion;Patient/family education;Dry needling;Joint Manipulations;Spinal Manipulations;Compression bandaging;Scar mobilization;Neuromuscular re-education;Fluidtherapy;Contrast Bath;Parrafin;Ultrasound    PT Next Visit Plan Continue to progress knee AROM as able. Continue quad stretch and contract/relax. Progress functional strength, gait and balance as tolerated.    PT Home Exercise Plan 05/01/20: ankle pump, quad set, glute set, heel slide; 10/25 LAQs 05/10/20: sit to stand, heel raise           Patient will benefit from skilled therapeutic intervention in order to improve the following deficits and impairments:  Abnormal gait,  Increased edema, Decreased scar mobility, Decreased activity tolerance, Decreased balance, Decreased mobility, Difficulty walking, Pain, Increased fascial restricitons, Decreased strength, Decreased range of motion, Impaired flexibility, Improper body mechanics  Visit Diagnosis: Stiffness of left knee, not elsewhere classified  Other abnormalities of gait and mobility  Left knee pain, unspecified chronicity     Problem List Patient Active Problem List   Diagnosis Date Noted  . Primary osteoarthritis of right knee 04/24/2020  . Endometrial polyp 03/19/2020  . Fibroids, intramural 11/12/2019  . Hypertension   . Hypercholesteremia   . History of kidney stones   . Diabetes mellitus without complication ([place])   . Colon polyps   . OA (osteoarthritis) of knee 10/14/2016   Ihor Austin, LPTA/CLT; CBIS [phone]  Aldona Lento 05/26/2020, 2:15 PM  [place], [place], [address] Phone: [phone]   Fax:  [phone]  Name: Amy Reyes MRN: [id number] Date of Birth: 01-May-1951

## 2020-05-29 ENCOUNTER — Encounter (HOSPITAL_COMMUNITY): Payer: Self-pay | Admitting: Physical Therapy

## 2020-05-29 ENCOUNTER — Other Ambulatory Visit: Payer: Self-pay

## 2020-05-29 ENCOUNTER — Ambulatory Visit (HOSPITAL_COMMUNITY): Payer: Medicare HMO | Admitting: Physical Therapy

## 2020-05-29 DIAGNOSIS — M25662 Stiffness of left knee, not elsewhere classified: Secondary | ICD-10-CM

## 2020-05-29 DIAGNOSIS — M25562 Pain in left knee: Secondary | ICD-10-CM | POA: Diagnosis not present

## 2020-05-29 DIAGNOSIS — R2689 Other abnormalities of gait and mobility: Secondary | ICD-10-CM

## 2020-05-29 NOTE — Therapy (Signed)
[facility] [address] [place], [place], [address] Phone: [phone]   Fax:  [phone]  Physical Therapy Treatment  Patient Details  Name: Amy Reyes MRN: [id number] Date of Birth: 1951/06/30 Referring Provider (PT): Theresa Duty Pa-C   Progress Note Reporting Period 05/01/20 to 05/29/20  See note below for Objective Data and Assessment of Progress/Goals.      Encounter Date: 05/29/2020   PT End of Session - 05/29/20 1041    Visit Number 10    Number of Visits 18    Date for PT Re-Evaluation 06/16/20    Authorization Type [employer]    Authorization Time Period approved 12 vits from 10/18-11/19/21    Authorization - Visit Number 10    Authorization - Number of Visits 12    Progress Note Due on Visit 10    PT Start Time 1034    PT Stop Time 1103   left early per patient request, had car appointment   PT Time Calculation (min) 29 min    Activity Tolerance Patient tolerated treatment well    Behavior During Therapy WFL for tasks assessed/performed           Past Medical History:  Diagnosis Date  . Allergy    seasonal allergies  . Anemia    iron defiencey  . Arthritis    oa  . Bronchitis hx of  . Chronic kidney disease    ckd stage 2- 3 due to NSAIDS use no nephrologist  . Colon polyps   . Complication of anesthesia    slow to wake up one time, none recent  . Diabetes mellitus without complication ([place])    borderline type 2 on metformin  . GERD (gastroesophageal reflux disease)    on meds  . History of carpal tunnel syndrome    years ago  . History of kidney stones   . History of rotator cuff tear    Bilateral  . Hypercholesteremia    on meds  . Hypertension    on meds  . Peptic ulcer   . PMB (postmenopausal bleeding)    last time middle of april 2021    Past Surgical History:  Procedure Laterality Date  . ABDOMINOPLASTY  30 yrs ago   tummy tuck  . BREAST SURGERY     reduction    . CHOLECYSTECTOMY  2000   laparoscopic  . COLONOSCOPY  2019   TA  . DILATATION & CURETTAGE/HYSTEROSCOPY WITH MYOSURE N/A 12/20/2019   Procedure: DILATATION & CURETTAGE/HYSTEROSCOPY WITH MYOSURE RESECTION OF POLYP;  Surgeon: Megan Salon, MD;  Location: [facility];  Service: Gynecology;  Laterality: N/A;  resection of polyp,  to follow first case  . INJECTION KNEE Left 10/14/2016   Procedure: CORTISONE LEFT KNEE INJECTION;  Surgeon: Gaynelle Arabian, MD;  Location: [facility];  Service: Orthopedics;  Laterality: Left;  . LAPAROSCOPIC GASTRIC BANDING  2010 or 2011  . left knee injection  6 weeks ago  . POLYPECTOMY     TA  . ROTATOR CUFF REPAIR Left 05/2019  . TOTAL KNEE ARTHROPLASTY Right 10/14/2016   Procedure: RIGHT TOTAL KNEE ARTHROPLASTY;  Surgeon: Gaynelle Arabian, MD;  Location: [facility];  Service: Orthopedics;  Laterality: Right;  Adductor Block  . TOTAL KNEE ARTHROPLASTY Left 04/24/2020   Procedure: TOTAL KNEE ARTHROPLASTY;  Surgeon: Gaynelle Arabian, MD;  Location: [facility];  Service: Orthopedics;  Laterality: Left;  15mn    There were no vitals filed for this  visit.   Subjective Assessment - 05/29/20 1040    Subjective Patient says she is still having swelling. Says she is walking better, but still has "stinging" at night around incision. Patetin reports overall 60% improvement since starting therapy.    Pertinent History LT TKA 04/24/20    Limitations Sitting;Standing;Lifting;Walking;House hold activities    Patient Stated Goals do better than I was doing, get back into gym    Currently in Pain? No/denies              [facility] PT Assessment - 05/29/20 0001      Assessment   Medical Diagnosis LT TKA     Referring Provider (PT) Theresa Duty Pa-C     Onset Date/Surgical Date 04/24/20      Precautions   Precautions None      Restrictions   Weight Bearing Restrictions No      Balance Screen   Has the patient fallen in the past 6 months No      [place] residence      Prior Function   Level of Independence Independent      Observation/Other Assessments   Focus on Therapeutic Outcomes (FOTO)  35% limited    was 67% limited      AROM   Left Knee Extension 5    Left Knee Flexion 102                         [facility] Adult PT Treatment/Exercise - 05/29/20 0001      Transfers   Five time sit to stand comments  10.15 sec with no UEs       Ambulation/Gait   Ambulation/Gait Yes    Ambulation/Gait Assistance 7: Independent    Ambulation Distance (Feet) 565 Feet    Assistive device None    Gait Pattern Antalgic;Decreased stance time - left    Ambulation Surface Level;Indoor    Gait Comments 2MWT      Knee/Hip Exercises: Stretches   Knee: Self-Stretch to increase Flexion Left;5 reps;10 seconds    Knee: Self-Stretch Limitations knee drive on 66YQ step     Gastroc Stretch 3 reps;30 seconds    Gastroc Stretch Limitations slant board      Knee/Hip Exercises: Aerobic   Stationary Bike 4' initially rocking then full revolution seat 16      Knee/Hip Exercises: Seated   Sit to Sand 1 set;10 reps;without UE support                  PT Education - 05/29/20 1042    Education Details on reassessment findings, progress toward therapy goals and POC    Person(s) Educated Patient    Methods Explanation    Comprehension Verbalized understanding            PT Short Term Goals - 05/29/20 1207      PT SHORT TERM GOAL #1   Title Patient will be independent with initial HEP and self-management strategies to improve functional outcomes    Baseline Reports compliance, but with questionable return    Time 2    Period Weeks    Status Partially Met    Target Date 05/26/20      PT SHORT TERM GOAL #2   Title Patient will have LT knee AROM 5-90 degrees to improve functional mobility and facilitate squatting to pick up items from floor.    Baseline Current 5-102 degrees  Time 3    Period Weeks      Status Achieved    Target Date 05/26/20             PT Long Term Goals - 05/29/20 1208      PT LONG TERM GOAL #1   Title Patient will have LT knee AROM 0-120 degrees to improve functional mobility and facilitate squatting to pick up items from floor.    Baseline Current 5-102 degrees    Time 6    Period Weeks    Status On-going      PT LONG TERM GOAL #2   Title Patient will be able to perform stand x 5 in < 15 seconds to demonstrate improvement in functional mobility and reduced risk for falls.    Baseline Current 10.15 seconds    Time 6    Period Weeks    Status Achieved      PT LONG TERM GOAL #3   Title Patient will improve FOTO score by 20% to indicate improvement in functional outcomes    Baseline Improved by 32%    Time 6    Period Weeks    Status Achieved      PT LONG TERM GOAL #4   Title Patient will be able to ambulate at least 300 feet during 2MWT with LRAD to demonstrate improved ability to perform functional mobility and associated tasks.    Baseline Current 565 feet with no AD    Time 6    Period Weeks    Status Achieved                 Plan - 05/29/20 1203    Clinical Impression Statement Patient is making good progress toward therapy goals. Patient doing much better with gait, strength and balance. Patient also reporting significant reduction in pain compared to post op. Patient still mostly limited by AROM restrictions, and is pain limited with full knee flexion and extension. Patient has met all long term therapy goals except knee AROM at this time. Patient will continue to benefit from skilled therapy services to address remaining deficits to reduce pain and improve functional mobility and ability to perform ADLs.    Examination-Activity Limitations Locomotion Level;Lift;Transfers;Stand;Carry;Stairs;Bend;Squat;Sleep    Examination-Participation Restrictions Community Activity;Laundry;Yard Work;Cleaning    Stability/Clinical Decision Making  Stable/Uncomplicated    Rehab Potential Good    PT Frequency 3x / week    PT Duration 6 weeks    PT Treatment/Interventions ADLs/Self Care Home Management;Aquatic Therapy;Biofeedback;Cryotherapy;Electrical Stimulation;Therapeutic activities;Iontophoresis 74m/ml Dexamethasone;Moist Heat;Traction;Balance training;Manual techniques;Manual lymph drainage;Therapeutic exercise;Taping;Vasopneumatic Device;Vestibular;Splinting;Functional mobility training;Energy conservation;Orthotic Fit/Training;Stair training;Gait training;DME Instruction;Passive range of motion;Patient/family education;Dry needling;Joint Manipulations;Spinal Manipulations;Compression bandaging;Scar mobilization;Neuromuscular re-education;Fluidtherapy;Contrast Bath;Parrafin;Ultrasound    PT Next Visit Plan Continue to progress knee AROM as able. Continue quad stretch and contract/relax. Progress functional strength, gait and balance as tolerated.    PT Home Exercise Plan 05/01/20: ankle pump, quad set, glute set, heel slide; 10/25 LAQs 05/10/20: sit to stand, heel raise    Consulted and Agree with Plan of Care Patient           Patient will benefit from skilled therapeutic intervention in order to improve the following deficits and impairments:  Abnormal gait, Increased edema, Decreased scar mobility, Decreased activity tolerance, Decreased balance, Decreased mobility, Difficulty walking, Pain, Increased fascial restricitons, Decreased strength, Decreased range of motion, Impaired flexibility, Improper body mechanics  Visit Diagnosis: Stiffness of left knee, not elsewhere classified  Other abnormalities of gait and mobility  Left knee pain, unspecified  chronicity     Problem List Patient Active Problem List   Diagnosis Date Noted  . Primary osteoarthritis of right knee 04/24/2020  . Endometrial polyp 03/19/2020  . Fibroids, intramural 11/12/2019  . Hypertension   . Hypercholesteremia   . History of kidney stones   .  Diabetes mellitus without complication ([place])   . Colon polyps   . OA (osteoarthritis) of knee 10/14/2016    12:12 PM, 05/29/20 Josue Hector PT DPT  Physical Therapist with [facility]  [phone] [facility] [address] [place], [place], [address] Phone: [phone]   Fax:  [phone]  Name: Amy Reyes MRN: [id number] Date of Birth: 1951-02-03

## 2020-05-30 DIAGNOSIS — Z96652 Presence of left artificial knee joint: Secondary | ICD-10-CM | POA: Diagnosis not present

## 2020-05-31 ENCOUNTER — Other Ambulatory Visit: Payer: Self-pay

## 2020-05-31 ENCOUNTER — Ambulatory Visit (HOSPITAL_COMMUNITY): Payer: Medicare HMO | Admitting: Physical Therapy

## 2020-05-31 DIAGNOSIS — M25662 Stiffness of left knee, not elsewhere classified: Secondary | ICD-10-CM | POA: Diagnosis not present

## 2020-05-31 DIAGNOSIS — M25562 Pain in left knee: Secondary | ICD-10-CM | POA: Diagnosis not present

## 2020-05-31 DIAGNOSIS — R2689 Other abnormalities of gait and mobility: Secondary | ICD-10-CM | POA: Diagnosis not present

## 2020-05-31 NOTE — Therapy (Signed)
[facility] [address] [place], [place], [address] Phone: [phone]   Fax:  [phone]  Physical Therapy Treatment  Patient Details  Name: Amy Reyes MRN: [id number] Date of Birth: [birth year] Referring Provider (PT): Theresa Duty Pa-C    Encounter Date: 05/31/2020   PT End of Session - 05/31/20 1058    Visit Number 11    Number of Visits 18    Date for PT Re-Evaluation 06/16/20    Authorization Type [employer]    Authorization Time Period approved 12 vits from 10/18-11/19/21    Authorization - Visit Number 11    Authorization - Number of Visits 12    Progress Note Due on Visit 10    PT Start Time 1055    PT Stop Time 1128    PT Time Calculation (min) 33 min    Activity Tolerance Patient tolerated treatment well    Behavior During Therapy [facility] for tasks assessed/performed           Past Medical History:  Diagnosis Date  . Allergy    seasonal allergies  . Anemia    iron defiencey  . Arthritis    oa  . Bronchitis hx of  . Chronic kidney disease    ckd stage 2- 3 due to NSAIDS use no nephrologist  . Colon polyps   . Complication of anesthesia    slow to wake up one time, none recent  . Diabetes mellitus without complication ([place])    borderline type 2 on metformin  . GERD (gastroesophageal reflux disease)    on meds  . History of carpal tunnel syndrome    years ago  . History of kidney stones   . History of rotator cuff tear    Bilateral  . Hypercholesteremia    on meds  . Hypertension    on meds  . Peptic ulcer   . PMB (postmenopausal bleeding)    last time middle of april 2021    Past Surgical History:  Procedure Laterality Date  . ABDOMINOPLASTY  30 yrs ago   tummy tuck  . BREAST SURGERY     reduction  . CHOLECYSTECTOMY  2000   laparoscopic  . COLONOSCOPY  2019   TA  . DILATATION & CURETTAGE/HYSTEROSCOPY WITH MYOSURE N/A 12/20/2019   Procedure: DILATATION & CURETTAGE/HYSTEROSCOPY  WITH MYOSURE RESECTION OF POLYP;  Surgeon: Megan Salon, MD;  Location: [facility];  Service: Gynecology;  Laterality: N/A;  resection of polyp,  to follow first case  . INJECTION KNEE Left 10/14/2016   Procedure: CORTISONE LEFT KNEE INJECTION;  Surgeon: Gaynelle Arabian, MD;  Location: [facility];  Service: Orthopedics;  Laterality: Left;  . LAPAROSCOPIC GASTRIC BANDING  2010 or 2011  . left knee injection  6 weeks ago  . POLYPECTOMY     TA  . ROTATOR CUFF REPAIR Left 05/2019  . TOTAL KNEE ARTHROPLASTY Right 10/14/2016   Procedure: RIGHT TOTAL KNEE ARTHROPLASTY;  Surgeon: Gaynelle Arabian, MD;  Location: [facility];  Service: Orthopedics;  Laterality: Right;  Adductor Block  . TOTAL KNEE ARTHROPLASTY Left 04/24/2020   Procedure: TOTAL KNEE ARTHROPLASTY;  Surgeon: Gaynelle Arabian, MD;  Location: [facility];  Service: Orthopedics;  Laterality: Left;  57mn    There were no vitals filed for this visit.   Subjective Assessment - 05/31/20 1053    Subjective PT states that her knee is stiff but it is not hurting.  PT saw MD yesterday who was pleased  with her progress.    Pertinent History LT TKA 04/24/20    Limitations Sitting;Standing;Lifting;Walking;House hold activities    Patient Stated Goals do better than I was doing, get back into gym    Currently in Pain? No/denies                             [facility] Adult PT Treatment/Exercise - 05/31/20 0001      Transfers   Five time sit to stand comments  --      Ambulation/Gait   Ambulation/Gait Yes    Ambulation/Gait Assistance 7: Independent    Ambulation Distance (Feet) 565 Feet    Assistive device None    Gait Pattern Antalgic;Decreased stance time - left    Gait Comments 2MWT      Exercises   Exercises Knee/Hip      Knee/Hip Exercises: Stretches   Quad Stretch 3 reps;30 seconds    Quad Stretch Limitations prone wiht rope    Knee: Self-Stretch to increase Flexion Left;5 reps;10 seconds    Knee: Self-Stretch  Limitations knee drive on 62UQ step     Gastroc Stretch 3 reps;30 seconds    Gastroc Stretch Limitations slant board      Knee/Hip Exercises: Aerobic   Stationary Bike 4' initially rocking then full revolution seat 16      Knee/Hip Exercises: Standing   Heel Raises Both;10 reps    Heel Raises Limitations no hands with squats     Knee Flexion Left;10 reps    Functional Squat 10 reps    SLS B x 2 RT:       Knee/Hip Exercises: Seated   Sit to Sand 1 set;10 reps;without UE support      Knee/Hip Exercises: Supine   Quad Sets 10 reps    Heel Slides Left;5 reps    Knee Extension AROM    Knee Extension Limitations 4    Knee Flexion AROM    Knee Flexion Limitations 110      Knee/Hip Exercises: Prone   Hamstring Curl 10 reps                    PT Short Term Goals - 05/29/20 1207      PT SHORT TERM GOAL #1   Title Patient will be independent with initial HEP and self-management strategies to improve functional outcomes    Baseline Reports compliance, but with questionable return    Time 2    Period Weeks    Status Partially Met    Target Date 05/26/20      PT SHORT TERM GOAL #2   Title Patient will have LT knee AROM 5-90 degrees to improve functional mobility and facilitate squatting to pick up items from floor.    Baseline Current 5-102 degrees    Time 3    Period Weeks    Status Achieved    Target Date 05/26/20             PT Long Term Goals - 05/29/20 1208      PT LONG TERM GOAL #1   Title Patient will have LT knee AROM 0-120 degrees to improve functional mobility and facilitate squatting to pick up items from floor.    Baseline Current 5-102 degrees    Time 6    Period Weeks    Status On-going      PT LONG TERM GOAL #2   Title Patient will be able to  perform stand x 5 in < 15 seconds to demonstrate improvement in functional mobility and reduced risk for falls.    Baseline Current 10.15 seconds    Time 6    Period Weeks    Status Achieved      PT  LONG TERM GOAL #3   Title Patient will improve FOTO score by 20% to indicate improvement in functional outcomes    Baseline Improved by 32%    Time 6    Period Weeks    Status Achieved      PT LONG TERM GOAL #4   Title Patient will be able to ambulate at least 300 feet during 2MWT with LRAD to demonstrate improved ability to perform functional mobility and associated tasks.    Baseline Current 565 feet with no AD    Time 6    Period Weeks    Status Achieved                 Plan - 05/31/20 1100    Clinical Impression Statement Pt continues to have limitation of ROM with slow progression.  She is completing her normal housework without much pain but stairs are still a challenge.  Therapist completed request for 12 more visits to focus on eccentric strength and ROM    Examination-Activity Limitations Locomotion Level;Lift;Transfers;Stand;Carry;Stairs;Bend;Squat;Sleep    Examination-Participation Restrictions Community Activity;Laundry;Yard Work;Cleaning    Stability/Clinical Decision Making Stable/Uncomplicated    Rehab Potential Good    PT Frequency 3x / week    PT Duration 6 weeks    PT Treatment/Interventions ADLs/Self Care Home Management;Aquatic Therapy;Biofeedback;Cryotherapy;Electrical Stimulation;Therapeutic activities;Iontophoresis 74m/ml Dexamethasone;Moist Heat;Traction;Balance training;Manual techniques;Manual lymph drainage;Therapeutic exercise;Taping;Vasopneumatic Device;Vestibular;Splinting;Functional mobility training;Energy conservation;Orthotic Fit/Training;Stair training;Gait training;DME Instruction;Passive range of motion;Patient/family education;Dry needling;Joint Manipulations;Spinal Manipulations;Compression bandaging;Scar mobilization;Neuromuscular re-education;Fluidtherapy;Contrast Bath;Parrafin;Ultrasound    PT Next Visit Plan Continue to progress knee AROM as able. Continue quad stretch and contract/relax. Progress functional strength, gait and balance as  tolerated.    PT Home Exercise Plan 05/01/20: ankle pump, quad set, glute set, heel slide; 10/25 LAQs 05/10/20: sit to stand, heel raise    Consulted and Agree with Plan of Care Patient           Patient will benefit from skilled therapeutic intervention in order to improve the following deficits and impairments:  Abnormal gait, Increased edema, Decreased scar mobility, Decreased activity tolerance, Decreased balance, Decreased mobility, Difficulty walking, Pain, Increased fascial restricitons, Decreased strength, Decreased range of motion, Impaired flexibility, Improper body mechanics  Visit Diagnosis: Stiffness of left knee, not elsewhere classified  Other abnormalities of gait and mobility     Problem List Patient Active Problem List   Diagnosis Date Noted  . Primary osteoarthritis of right knee 04/24/2020  . Endometrial polyp 03/19/2020  . Fibroids, intramural 11/12/2019  . Hypertension   . Hypercholesteremia   . History of kidney stones   . [place]   . Colon polyps   . OA (osteoarthritis) of knee 10/14/2016    CRayetta Humphrey PT CLT [phone]/17/2021, 11:26 AM  [id number] [address]Phone: [phone]  Fax:  [phone] Name: Amy CROSONMRN: [id number] of Birth: [birth year]

## 2020-06-02 ENCOUNTER — Other Ambulatory Visit: Payer: Self-pay

## 2020-06-02 ENCOUNTER — Ambulatory Visit (HOSPITAL_COMMUNITY): Payer: Medicare HMO | Admitting: Physical Therapy

## 2020-06-02 DIAGNOSIS — M25662 Stiffness of left knee, not elsewhere classified: Secondary | ICD-10-CM

## 2020-06-02 DIAGNOSIS — R2689 Other abnormalities of gait and mobility: Secondary | ICD-10-CM

## 2020-06-02 DIAGNOSIS — M25562 Pain in left knee: Secondary | ICD-10-CM | POA: Diagnosis not present

## 2020-06-02 NOTE — Therapy (Signed)
[place], [place], [address] Phone: [phone]   Fax:  [phone]  Physical Therapy Treatment  Patient Details  Name: Amy Reyes MRN: [id number] Date of Birth: 06-16-1951 Referring Provider (PT): Theresa Duty Pa-C    Encounter Date: 06/02/2020   PT End of Session - 06/02/20 1219    Visit Number 12    Number of Visits 18    Date for PT Re-Evaluation 06/16/20    Authorization Type [employer]    Authorization Time Period approved 12 vits from 10/18-11/19/21    Authorization - Visit Number 12    Authorization - Number of Visits 12    Progress Note Due on Visit 20    PT Start Time 1138    PT Stop Time 1220    PT Time Calculation (min) 42 min    Activity Tolerance Patient tolerated treatment well    Behavior During Therapy [facility] for tasks assessed/performed           Past Medical History:  Diagnosis Date  . Allergy    seasonal allergies  . Anemia    iron defiencey  . Arthritis    oa  . Bronchitis hx of  . Chronic kidney disease    ckd stage 2- 3 due to NSAIDS use no nephrologist  . Colon polyps   . Complication of anesthesia    slow to wake up one time, none recent  . Diabetes mellitus without complication ([place])    borderline type 2 on metformin  . GERD (gastroesophageal reflux disease)    on meds  . History of carpal tunnel syndrome    years ago  . History of kidney stones   . History of rotator cuff tear    Bilateral  . Hypercholesteremia    on meds  . Hypertension    on meds  . Peptic ulcer   . PMB (postmenopausal bleeding)    last time middle of april 2021    Past Surgical History:  Procedure Laterality Date  . ABDOMINOPLASTY  30 yrs ago   tummy tuck  . BREAST SURGERY     reduction  . CHOLECYSTECTOMY  2000   laparoscopic  . COLONOSCOPY  2019   TA  . DILATATION & CURETTAGE/HYSTEROSCOPY WITH MYOSURE N/A 12/20/2019   Procedure: DILATATION & CURETTAGE/HYSTEROSCOPY  WITH MYOSURE RESECTION OF POLYP;  Surgeon: Megan Salon, MD;  Location: [facility];  Service: Gynecology;  Laterality: N/A;  resection of polyp,  to follow first case  . INJECTION KNEE Left 10/14/2016   Procedure: CORTISONE LEFT KNEE INJECTION;  Surgeon: Gaynelle Arabian, MD;  Location: [facility];  Service: Orthopedics;  Laterality: Left;  . LAPAROSCOPIC GASTRIC BANDING  2010 or 2011  . left knee injection  6 weeks ago  . POLYPECTOMY     TA  . ROTATOR CUFF REPAIR Left 05/2019  . TOTAL KNEE ARTHROPLASTY Right 10/14/2016   Procedure: RIGHT TOTAL KNEE ARTHROPLASTY;  Surgeon: Gaynelle Arabian, MD;  Location: [facility];  Service: Orthopedics;  Laterality: Right;  Adductor Block  . TOTAL KNEE ARTHROPLASTY Left 04/24/2020   Procedure: TOTAL KNEE ARTHROPLASTY;  Surgeon: Gaynelle Arabian, MD;  Location: [facility];  Service: Orthopedics;  Laterality: Left;  46mn    There were no vitals filed for this visit.                      [facility] PT Treatment/Exercise - 06/02/20 0001  Knee/Hip Exercises: Stretches   Knee: Self-Stretch to increase Flexion Left;5 reps;10 seconds    Knee: Self-Stretch Limitations knee drive on 82LM step     Gastroc Stretch 3 reps;30 seconds    Gastroc Stretch Limitations slant board      Knee/Hip Exercises: Aerobic   Stationary Bike 4' initially rocking then full revolution seat 16      Knee/Hip Exercises: Standing   Heel Raises Both;10 reps    Heel Raises Limitations no hands with squats     Knee Flexion Left;10 reps      Knee/Hip Exercises: Seated   Sit to Sand 1 set;10 reps;without UE support      Knee/Hip Exercises: Supine   Knee Extension AROM    Knee Extension Limitations 4    Knee Flexion AROM    Knee Flexion Limitations 110                    PT Short Term Goals - 05/29/20 1207      PT SHORT TERM GOAL #1   Title Patient will be independent with initial HEP and self-management strategies to improve functional outcomes     Baseline Reports compliance, but with questionable return    Time 2    Period Weeks    Status Partially Met    Target Date 05/26/20      PT SHORT TERM GOAL #2   Title Patient will have LT knee AROM 5-90 degrees to improve functional mobility and facilitate squatting to pick up items from floor.    Baseline Current 5-102 degrees    Time 3    Period Weeks    Status Achieved    Target Date 05/26/20             PT Long Term Goals - 05/29/20 1208      PT LONG TERM GOAL #1   Title Patient will have LT knee AROM 0-120 degrees to improve functional mobility and facilitate squatting to pick up items from floor.    Baseline Current 5-102 degrees    Time 6    Period Weeks    Status On-going      PT LONG TERM GOAL #2   Title Patient will be able to perform stand x 5 in < 15 seconds to demonstrate improvement in functional mobility and reduced risk for falls.    Baseline Current 10.15 seconds    Time 6    Period Weeks    Status Achieved      PT LONG TERM GOAL #3   Title Patient will improve FOTO score by 20% to indicate improvement in functional outcomes    Baseline Improved by 32%    Time 6    Period Weeks    Status Achieved      PT LONG TERM GOAL #4   Title Patient will be able to ambulate at least 300 feet during 2MWT with LRAD to demonstrate improved ability to perform functional mobility and associated tasks.    Baseline Current 565 feet with no AD    Time 6    Period Weeks    Status Achieved                 Plan - 06/02/20 1219    Clinical Impression Statement Continued with primary focus on Lt knee ROM.  Began on bike and progressed through established therex.  Pt able to make full revolutions with increased ease today.  No manual completed per patient request.  Pt  with questions regarding her ongoing stiffness; assured her she is progressing well.  Pt able to complete all exercises in good form and without complaints other than stiffness.  AROM at end of  session 4-110    Examination-Activity Limitations Locomotion Level;Lift;Transfers;Stand;Carry;Stairs;Bend;Squat;Sleep    Examination-Participation Restrictions Community Activity;Laundry;Yard Work;Cleaning    Stability/Clinical Decision Making Stable/Uncomplicated    Rehab Potential Good    PT Frequency 3x / week    PT Duration 6 weeks    PT Treatment/Interventions ADLs/Self Care Home Management;Aquatic Therapy;Biofeedback;Cryotherapy;Electrical Stimulation;Therapeutic activities;Iontophoresis 44m/ml Dexamethasone;Moist Heat;Traction;Balance training;Manual techniques;Manual lymph drainage;Therapeutic exercise;Taping;Vasopneumatic Device;Vestibular;Splinting;Functional mobility training;Energy conservation;Orthotic Fit/Training;Stair training;Gait training;DME Instruction;Passive range of motion;Patient/family education;Dry needling;Joint Manipulations;Spinal Manipulations;Compression bandaging;Scar mobilization;Neuromuscular re-education;Fluidtherapy;Contrast Bath;Parrafin;Ultrasound    PT Next Visit Plan Continue to progress knee AROM as able. Continue quad stretch and contract/relax. Progress functional strength, gait and balance as tolerated.    PT Home Exercise Plan 05/01/20: ankle pump, quad set, glute set, heel slide; 10/25 LAQs 05/10/20: sit to stand, heel raise    Consulted and Agree with Plan of Care Patient           Patient will benefit from skilled therapeutic intervention in order to improve the following deficits and impairments:  Abnormal gait, Increased edema, Decreased scar mobility, Decreased activity tolerance, Decreased balance, Decreased mobility, Difficulty walking, Pain, Increased fascial restricitons, Decreased strength, Decreased range of motion, Impaired flexibility, Improper body mechanics  Visit Diagnosis: Stiffness of left knee, not elsewhere classified  Other abnormalities of gait and mobility  Left knee pain, unspecified chronicity     Problem List Patient  Active Problem List   Diagnosis Date Noted  . Primary osteoarthritis of right knee 04/24/2020  . Endometrial polyp 03/19/2020  . Fibroids, intramural 11/12/2019  . Hypertension   . Hypercholesteremia   . History of kidney stones   . Diabetes mellitus without complication ([place]   . Colon polyps   . OA (osteoarthritis) of knee 10/14/2016   ATeena Irani PTA/CLT [phone] FTeena Irani11/19/2021, 12:21 PM  [place] DriveSGervais [place] [address]Phone: [phone]  Fax:  [phone] Name: Amy FISCUSMRN: [id number] of Birth: [birth year]

## 2020-06-05 ENCOUNTER — Ambulatory Visit (HOSPITAL_COMMUNITY): Payer: Medicare HMO | Admitting: Physical Therapy

## 2020-06-05 ENCOUNTER — Encounter (HOSPITAL_COMMUNITY): Payer: Self-pay | Admitting: Physical Therapy

## 2020-06-05 ENCOUNTER — Other Ambulatory Visit: Payer: Self-pay

## 2020-06-05 DIAGNOSIS — M25662 Stiffness of left knee, not elsewhere classified: Secondary | ICD-10-CM | POA: Diagnosis not present

## 2020-06-05 DIAGNOSIS — R2689 Other abnormalities of gait and mobility: Secondary | ICD-10-CM | POA: Diagnosis not present

## 2020-06-05 DIAGNOSIS — M25562 Pain in left knee: Secondary | ICD-10-CM | POA: Diagnosis not present

## 2020-06-05 NOTE — Therapy (Signed)
Amy Reyes [address] [place], [place], [address] Phone: [phone]   Fax:  [phone]  Physical Therapy Treatment  Patient Details  Name: Amy Reyes MRN: [id number] Date of Birth: [birth year] Referring Provider (PT): Theresa Duty Pa-C    Encounter Date: 06/05/2020   PT End of Session - 06/05/20 1047    Visit Number 13    Number of Visits 18    Date for PT Re-Evaluation 06/16/20    Authorization Type [employer]    Authorization Time Period approved 12 vits from 11/22-12/30    Authorization - Visit Number 1    Authorization - Number of Visits 12    Progress Note Due on Visit 6    PT Start Time 4920   late   PT Stop Time 1115    PT Time Calculation (min) 35 min    Activity Tolerance Patient tolerated treatment well    Behavior During Therapy [facility] for tasks assessed/performed           Past Medical History:  Diagnosis Date  . Allergy    seasonal allergies  . Anemia    iron defiencey  . Arthritis    oa  . Bronchitis hx of  . Chronic kidney disease    ckd stage 2- 3 due to NSAIDS use no nephrologist  . Colon polyps   . Complication of anesthesia    slow to wake up one time, none recent  . Diabetes mellitus without complication ([place])    borderline type 2 on metformin  . GERD (gastroesophageal reflux disease)    on meds  . History of carpal tunnel syndrome    years ago  . History of kidney stones   . History of rotator cuff tear    Bilateral  . Hypercholesteremia    on meds  . Hypertension    on meds  . Peptic ulcer   . PMB (postmenopausal bleeding)    last time middle of april 2021    Past Surgical History:  Procedure Laterality Date  . ABDOMINOPLASTY  30 yrs ago   tummy tuck  . BREAST SURGERY     reduction  . CHOLECYSTECTOMY  2000   laparoscopic  . COLONOSCOPY  2019   TA  . DILATATION & CURETTAGE/HYSTEROSCOPY WITH MYOSURE N/A 12/20/2019   Procedure: DILATATION &  CURETTAGE/HYSTEROSCOPY WITH MYOSURE RESECTION OF POLYP;  Surgeon: Megan Salon, MD;  Location: [facility];  Service: Gynecology;  Laterality: N/A;  resection of polyp,  to follow first case  . INJECTION KNEE Left 10/14/2016   Procedure: CORTISONE LEFT KNEE INJECTION;  Surgeon: Gaynelle Arabian, MD;  Location[facility];  Service: Orthopedics;  Laterality: Left;  . LAPAROSCOPIC GASTRIC BANDING  2010 or 2011  . left knee injection  6 weeks ago  . POLYPECTOMY     TA  . ROTATOR CUFF REPAIR Left 05/2019  . TOTAL KNEE ARTHROPLASTY Right 10/14/2016   Procedure: RIGHT TOTAL KNEE ARTHROPLASTY;  Surgeon: Gaynelle Arabian, MD;  [facility];  Service: Orthopedics;  Laterality: Right;  Adductor Block  . TOTAL KNEE ARTHROPLASTY Left 04/24/2020   Procedure: TOTAL KNEE ARTHROPLASTY;  Surgeon: Gaynelle Arabian, MD;  Location[facility];  Service: Orthopedics;  Laterality: Left;  61mn    There were no vitals filed for this visit.   Subjective Assessment - 06/05/20 1046    Subjective Patient says knee is still stiff, feels like rubber band. Still has pain at night. Getting better during  the day.    Pertinent History LT TKA 04/24/20    Limitations Sitting;Standing;Lifting;Walking;House hold activities    Patient Stated Goals do better than I was doing, get back into gym    Currently in Pain? No/denies    Pain Orientation Left                             [facility] Adult PT Treatment/Exercise - 06/05/20 0001      Knee/Hip Exercises: Stretches   Passive Hamstring Stretch Left;3 reps;30 seconds    Passive Hamstring Stretch Limitations seated     Gastroc Stretch 3 reps;30 seconds    Gastroc Stretch Limitations slant board      Knee/Hip Exercises: Aerobic   Stationary Bike 4' seat 15      Knee/Hip Exercises: Machines for Strengthening   Cybex Leg Press 30#  2 x 10       Knee/Hip Exercises: Standing   Heel Raises Both;2 sets;10 reps    Lateral Step Up Left;1 set;15 reps;Hand Hold:  1;Step Height: 6"    Forward Step Up Left;1 set;15 reps;Hand Hold: 1;Step Height: 6"    Step Down Left;1 set;Hand Hold: 6;30 reps;Step Height: 6"    SLS 30 seconds on LLE    SLS with Vectors x15 sec 3 way on LLE     Gait Training 2RT in clinic, no AD       Knee/Hip Exercises: Supine   Knee Extension AROM    Knee Extension Limitations 5    Knee Flexion AROM    Knee Flexion Limitations 110                    PT Short Term Goals - 05/29/20 1207      PT SHORT TERM GOAL #1   Title Patient will be independent with initial HEP and self-management strategies to improve functional outcomes    Baseline Reports compliance, but with questionable return    Time 2    Period Weeks    Status Partially Met    Target Date 05/26/20      PT SHORT TERM GOAL #2   Title Patient will have LT knee AROM 5-90 degrees to improve functional mobility and facilitate squatting to pick up items from floor.    Baseline Current 5-102 degrees    Time 3    Period Weeks    Status Achieved    Target Date 05/26/20             PT Long Term Goals - 05/29/20 1208      PT LONG TERM GOAL #1   Title Patient will have LT knee AROM 0-120 degrees to improve functional mobility and facilitate squatting to pick up items from floor.    Baseline Current 5-102 degrees    Time 6    Period Weeks    Status On-going      PT LONG TERM GOAL #2   Title Patient will be able to perform stand x 5 in < 15 seconds to demonstrate improvement in functional mobility and reduced risk for falls.    Baseline Current 10.15 seconds    Time 6    Period Weeks    Status Achieved      PT LONG TERM GOAL #3   Title Patient will improve FOTO score by 20% to indicate improvement in functional outcomes    Baseline Improved by 32%    Time 6    Period  Weeks    Status Achieved      PT LONG TERM GOAL #4   Title Patient will be able to ambulate at least 300 feet during 2MWT with LRAD to demonstrate improved ability to perform  functional mobility and associated tasks.    Baseline Current 565 feet with no AD    Time 6    Period Weeks    Status Achieved                 Plan - 06/05/20 1115    Clinical Impression Statement Patient progressing well toward therapy goals., Patient shows improving function and is able to perform step up on elevated box with less UYE support. Patient continues to be challenged with higher level balance activity e.g. single leg vectors. Patient also with ongoing mild limitation in knee AROM but is progressing well overall.    Examination-Activity Limitations Locomotion Level;Lift;Transfers;Stand;Carry;Stairs;Bend;Squat;Sleep    Examination-Participation Restrictions Community Activity;Laundry;Yard Work;Cleaning    Stability/Clinical Decision Making Stable/Uncomplicated    Rehab Potential Good    PT Frequency 3x / week    PT Duration 6 weeks    PT Treatment/Interventions ADLs/Self Care Home Management;Aquatic Therapy;Biofeedback;Cryotherapy;Electrical Stimulation;Therapeutic activities;Iontophoresis 64m/ml Dexamethasone;Moist Heat;Traction;Balance training;Manual techniques;Manual lymph drainage;Therapeutic exercise;Taping;Vasopneumatic Device;Vestibular;Splinting;Functional mobility training;Energy conservation;Orthotic Fit/Training;Stair training;Gait training;DME Instruction;Passive range of motion;Patient/family education;Dry needling;Joint Manipulations;Spinal Manipulations;Compression bandaging;Scar mobilization;Neuromuscular re-education;Fluidtherapy;Contrast Bath;Parrafin;Ultrasound    PT Next Visit Plan Continue to progress knee AROM as able. Continue quad stretch and contract/relax. Progress functional strength, gait and balance as tolerated.    PT Home Exercise Plan 05/01/20: ankle pump, quad set, glute set, heel slide; 10/25 LAQs 05/10/20: sit to stand, heel raise    Consulted and Agree with Plan of Care Patient           Patient will benefit from skilled therapeutic  intervention in order to improve the following deficits and impairments:  Abnormal gait, Increased edema, Decreased scar mobility, Decreased activity tolerance, Decreased balance, Decreased mobility, Difficulty walking, Pain, Increased fascial restricitons, Decreased strength, Decreased range of motion, Impaired flexibility, Improper body mechanics  Visit Diagnosis: Stiffness of left knee, not elsewhere classified  Other abnormalities of gait and mobility  Left knee pain, unspecified chronicity     Problem List Patient Active Problem List   Diagnosis Date Noted  . Primary osteoarthritis of right knee 04/24/2020  . Endometrial polyp 03/19/2020  . Fibroids, intramural 11/12/2019  . Hypertension   . Hypercholesteremia   . History of kidney stones   . Diabetes mellitus without complication ([place]   . Colon polyps   . OA (osteoarthritis) of knee 10/14/2016    11:17 AM, 06/05/20 CJosue HectorPT DPT  [facility] [phone] StreetSRosaryville [place] [address]Phone: [phone]  Fax:  [phone] Name: DDELLIE PIASECKIMRN: [id number] of Birth: [birth year]

## 2020-06-07 ENCOUNTER — Other Ambulatory Visit: Payer: Self-pay

## 2020-06-07 ENCOUNTER — Ambulatory Visit (HOSPITAL_COMMUNITY): Payer: Medicare HMO | Admitting: Physical Therapy

## 2020-06-07 DIAGNOSIS — M25662 Stiffness of left knee, not elsewhere classified: Secondary | ICD-10-CM

## 2020-06-07 DIAGNOSIS — M25562 Pain in left knee: Secondary | ICD-10-CM | POA: Diagnosis not present

## 2020-06-07 DIAGNOSIS — R2689 Other abnormalities of gait and mobility: Secondary | ICD-10-CM | POA: Diagnosis not present

## 2020-06-07 NOTE — Therapy (Signed)
[place], [place], [address] Phone: [phone]   Fax:  [phone]  Physical Therapy Treatment  Patient Details  Name: Amy Reyes MRN: [id number] Date of Birth: [birth year] Referring Provider (PT): Theresa Duty Pa-C    Encounter Date: 06/07/2020   PT End of Session - 06/07/20 1044    Visit Number 14    Number of Visits 18    Date for PT Re-Evaluation 06/16/20    Authorization Type [employer]    Authorization Time Period approved 12 vits from 11/22-12/30    Authorization - Visit Number 2    Authorization - Number of Visits 12    Progress Note Due on Visit 20    PT Start Time 1010    PT Stop Time 1042    PT Time Calculation (min) 32 min    Activity Tolerance Patient tolerated treatment well    Behavior During Therapy [facility] for tasks assessed/performed           Past Medical History:  Diagnosis Date  . Allergy    seasonal allergies  . Anemia    iron defiencey  . Arthritis    oa  . Bronchitis hx of  . Chronic kidney disease    ckd stage 2- 3 due to NSAIDS use no nephrologist  . Colon polyps   . Complication of anesthesia    slow to wake up one time, none recent  . Diabetes mellitus without complication ([place])    borderline type 2 on metformin  . GERD (gastroesophageal reflux disease)    on meds  . History of carpal tunnel syndrome    years ago  . History of kidney stones   . History of rotator cuff tear    Bilateral  . Hypercholesteremia    on meds  . Hypertension    on meds  . Peptic ulcer   . PMB (postmenopausal bleeding)    last time middle of april 2021    Past Surgical History:  Procedure Laterality Date  . ABDOMINOPLASTY  30 yrs ago   tummy tuck  . BREAST SURGERY     reduction  . CHOLECYSTECTOMY  2000   laparoscopic  . COLONOSCOPY  2019   TA  . DILATATION & CURETTAGE/HYSTEROSCOPY WITH MYOSURE N/A 12/20/2019   Procedure: DILATATION & CURETTAGE/HYSTEROSCOPY WITH  MYOSURE RESECTION OF POLYP;  Surgeon: Megan Salon, MD;  Location: [place];  Service: Gynecology;  Laterality: N/A;  resection of polyp,  to follow first case  . INJECTION KNEE Left 10/14/2016   Procedure: CORTISONE LEFT KNEE INJECTION;  Surgeon: Gaynelle Arabian, MD;  Location: [facility];  Service: Orthopedics;  Laterality: Left;  . LAPAROSCOPIC GASTRIC BANDING  2010 or 2011  . left knee injection  6 weeks ago  . POLYPECTOMY     TA  . ROTATOR CUFF REPAIR Left 05/2019  . TOTAL KNEE ARTHROPLASTY Right 10/14/2016   Procedure: RIGHT TOTAL KNEE ARTHROPLASTY;  Surgeon: Gaynelle Arabian, MD;  Location: [facility];  Service: Orthopedics;  Laterality: Right;  Adductor Block  . TOTAL KNEE ARTHROPLASTY Left 04/24/2020   Procedure: TOTAL KNEE ARTHROPLASTY;  Surgeon: Gaynelle Arabian, MD;  Location: [facility];  Service: Orthopedics;  Laterality: Left;  62mn    There were no vitals filed for this visit.   Subjective Assessment - 06/07/20 1026    Subjective Pt showed 10 minutes late for appt and asked to leave early as well due to childcare.  Pt states  most of pain and discomfort occurs at night when she's still.  Stiffness in the mormning.    Currently in Pain? No/denies                             [facility] Adult PT Treatment/Exercise - 06/07/20 0001      Knee/Hip Exercises: Stretches   Passive Hamstring Stretch Left;3 reps;30 seconds    Passive Hamstring Stretch Limitations standing    Knee: Self-Stretch to increase Flexion Left;5 reps;10 seconds    Knee: Self-Stretch Limitations knee drive on 81EX step       Knee/Hip Exercises: Aerobic   Stationary Bike 4' seat 15      Knee/Hip Exercises: Standing   Lateral Step Up Left;1 set;15 reps;Hand Hold: 1;Step Height: 6"    Forward Step Up Left;1 set;15 reps;Hand Hold: 1;Step Height: 6"    Step Down Left;1 set;Hand Hold: 1;15 reps;Step Height: 6"      Knee/Hip Exercises: Supine   Knee Extension AROM    Knee Extension Limitations 5     Knee Flexion AROM    Knee Flexion Limitations 110                    PT Short Term Goals - 05/29/20 1207      PT SHORT TERM GOAL #1   Title Patient will be independent with initial HEP and self-management strategies to improve functional outcomes    Baseline Reports compliance, but with questionable return    Time 2    Period Weeks    Status Partially Met    Target Date 05/26/20      PT SHORT TERM GOAL #2   Title Patient will have LT knee AROM 5-90 degrees to improve functional mobility and facilitate squatting to pick up items from floor.    Baseline Current 5-102 degrees    Time 3    Period Weeks    Status Achieved    Target Date 05/26/20             PT Long Term Goals - 05/29/20 1208      PT LONG TERM GOAL #1   Title Patient will have LT knee AROM 0-120 degrees to improve functional mobility and facilitate squatting to pick up items from floor.    Baseline Current 5-102 degrees    Time 6    Period Weeks    Status On-going      PT LONG TERM GOAL #2   Title Patient will be able to perform stand x 5 in < 15 seconds to demonstrate improvement in functional mobility and reduced risk for falls.    Baseline Current 10.15 seconds    Time 6    Period Weeks    Status Achieved      PT LONG TERM GOAL #3   Title Patient will improve FOTO score by 20% to indicate improvement in functional outcomes    Baseline Improved by 32%    Time 6    Period Weeks    Status Achieved      PT LONG TERM GOAL #4   Title Patient will be able to ambulate at least 300 feet during 2MWT with LRAD to demonstrate improved ability to perform functional mobility and associated tasks.    Baseline Current 565 feet with no AD    Time 6    Period Weeks    Status Achieved  Plan - 06/07/20 1043    Clinical Impression Statement Pt arrived late and requested to leave early this session.  Continued with focus on improving LE strength and ROM.  Pt able to make full  revolution on bike today on first revolution.  Continues to require bil UE's with step up task and cues for mechanics and posturing.   AROM today 5-112 degrees in supine.  Unable to complete all therex today due to time constraints.    Examination-Activity Limitations Locomotion Level;Lift;Transfers;Stand;Carry;Stairs;Bend;Squat;Sleep    Examination-Participation Restrictions Community Activity;Laundry;Yard Work;Cleaning    Stability/Clinical Decision Making Stable/Uncomplicated    Rehab Potential Good    PT Frequency 3x / week    PT Duration 6 weeks    PT Treatment/Interventions ADLs/Self Care Home Management;Aquatic Therapy;Biofeedback;Cryotherapy;Electrical Stimulation;Therapeutic activities;Iontophoresis 40m/ml Dexamethasone;Moist Heat;Traction;Balance training;Manual techniques;Manual lymph drainage;Therapeutic exercise;Taping;Vasopneumatic Device;Vestibular;Splinting;Functional mobility training;Energy conservation;Orthotic Fit/Training;Stair training;Gait training;DME Instruction;Passive range of motion;Patient/family education;Dry needling;Joint Manipulations;Spinal Manipulations;Compression bandaging;Scar mobilization;Neuromuscular re-education;Fluidtherapy;Contrast Bath;Parrafin;Ultrasound    PT Next Visit Plan Continue to progress knee AROM as able. Continue quad stretch and contract/relax. Progress functional strength, gait and balance as tolerated.    PT Home Exercise Plan 05/01/20: ankle pump, quad set, glute set, heel slide; 10/25 LAQs 05/10/20: sit to stand, heel raise    Consulted and Agree with Plan of Care Patient           Patient will benefit from skilled therapeutic intervention in order to improve the following deficits and impairments:  Abnormal gait, Increased edema, Decreased scar mobility, Decreased activity tolerance, Decreased balance, Decreased mobility, Difficulty walking, Pain, Increased fascial restricitons, Decreased strength, Decreased range of motion, Impaired  flexibility, Improper body mechanics  Visit Diagnosis: Stiffness of left knee, not elsewhere classified  Other abnormalities of gait and mobility  Left knee pain, unspecified chronicity     Problem List Patient Active Problem List   Diagnosis Date Noted  . Primary osteoarthritis of right knee 04/24/2020  . Endometrial polyp 03/19/2020  . Fibroids, intramural 11/12/2019  . Hypertension   . Hypercholesteremia   . History of kidney stones   . [place]   . Colon polyps   . OA (osteoarthritis) of knee 10/14/2016   ATeena Reyes PTA/CLT [phone] FTeena Irani11/24/2021, 10:45 AM  [place] [address]Phone: [phone]  Fax:  [phone] Name: Amy SPRUELLMRN: [id number] of Birth: 5Aug 03, 1952

## 2020-06-12 ENCOUNTER — Telehealth: Payer: Self-pay

## 2020-06-12 ENCOUNTER — Ambulatory Visit (HOSPITAL_COMMUNITY): Payer: Medicare HMO | Admitting: Physical Therapy

## 2020-06-12 ENCOUNTER — Telehealth (HOSPITAL_COMMUNITY): Payer: Self-pay | Admitting: Physical Therapy

## 2020-06-12 NOTE — Telephone Encounter (Signed)
Last OV 03/16/20- PMB post op with SM  Hysteroscopy D&C with endometrial polyp resection 12/20/19 with SM H/o uterine fibroids   Spoke with pt. Pt states having light vaginal bleeding/spotting that started 4-5 days intermittently when voiding. Pt states had mild abd cramps before spotting started, but has resolved. Denies wearing any pad or panty liner, only seeing spotting when wiping.  Denies any heavy bleeding clots, vaginal sx, UTI sx, fever, chills. Pt has not taken any OTC medications. Has not been seen by PCP or UC or ER.  Per reviewed notes from Dr Sabra Heck on 03/16/20, pt to have PUS if bleeding reoccurs. Pt declines to schedule at this time.   Pt advised to have OV for further evaluation. Pt agreeable. Pt offered multiple appts, declines due to personal schedule. Pt agreeable to see another provider. Pt scheduled with Dr Talbert Nan on 12/7 at 35 am. Pt verbalized understanding to date and time of appt.  Routing to Dr Talbert Nan for review Encounter closed

## 2020-06-12 NOTE — Telephone Encounter (Signed)
pt cancelled appt for today because she said she is not feeling her best

## 2020-06-12 NOTE — Telephone Encounter (Signed)
Patient is calling in regards to irregular spotting after surgery.

## 2020-06-14 ENCOUNTER — Ambulatory Visit (HOSPITAL_COMMUNITY): Payer: Medicare HMO | Attending: Student | Admitting: Physical Therapy

## 2020-06-14 ENCOUNTER — Other Ambulatory Visit: Payer: Self-pay

## 2020-06-14 DIAGNOSIS — M25662 Stiffness of left knee, not elsewhere classified: Secondary | ICD-10-CM

## 2020-06-14 DIAGNOSIS — M25562 Pain in left knee: Secondary | ICD-10-CM

## 2020-06-14 DIAGNOSIS — R2689 Other abnormalities of gait and mobility: Secondary | ICD-10-CM

## 2020-06-14 NOTE — Therapy (Signed)
[place], [place], [address] Phone: [phone]   Fax:  [phone]  Physical Therapy Treatment  Patient Details  Name: Amy Reyes MRN: [id number] Date of Birth: Sep 18, 1950 Referring Provider (PT): Theresa Duty Pa-C    Encounter Date: 06/14/2020   PT End of Session - 06/14/20 1131    Visit Number 15    Number of Visits 18    Date for PT Re-Evaluation 06/16/20    Authorization Type [employer]    Authorization Time Period approved 12 vits from 11/22-12/30    Authorization - Visit Number 3    Authorization - Number of Visits 12    Progress Note Due on Visit 20    PT Start Time 2694    PT Stop Time 1129    PT Time Calculation (min) 38 min    Activity Tolerance Patient tolerated treatment well    Behavior During Therapy [facility] for tasks assessed/performed           Past Medical History:  Diagnosis Date  . Allergy    seasonal allergies  . Anemia    iron defiencey  . Arthritis    oa  . Bronchitis hx of  . Chronic kidney disease    ckd stage 2- 3 due to NSAIDS use no nephrologist  . Colon polyps   . Complication of anesthesia    slow to wake up one time, none recent  . Diabetes mellitus without complication ([place])    borderline type 2 on metformin  . GERD (gastroesophageal reflux disease)    on meds  . History of carpal tunnel syndrome    years ago  . History of kidney stones   . History of rotator cuff tear    Bilateral  . Hypercholesteremia    on meds  . Hypertension    on meds  . Peptic ulcer   . PMB (postmenopausal bleeding)    last time middle of april 2021    Past Surgical History:  Procedure Laterality Date  . ABDOMINOPLASTY  30 yrs ago   tummy tuck  . BREAST SURGERY     reduction  . CHOLECYSTECTOMY  2000   laparoscopic  . COLONOSCOPY  2019   TA  . DILATATION & CURETTAGE/HYSTEROSCOPY WITH MYOSURE N/A 12/20/2019   Procedure: DILATATION & CURETTAGE/HYSTEROSCOPY WITH  MYOSURE RESECTION OF POLYP;  Surgeon: Megan Salon, MD;  Location: [place];  Service: Gynecology;  Laterality: N/A;  resection of polyp,  to follow first case  . INJECTION KNEE Left 10/14/2016   Procedure: CORTISONE LEFT KNEE INJECTION;  Surgeon: Gaynelle Arabian, MD;  [facility];  Service: Orthopedics;  Laterality: Left;  . LAPAROSCOPIC GASTRIC BANDING  2010 or 2011  . left knee injection  6 weeks ago  . POLYPECTOMY     TA  . ROTATOR CUFF REPAIR Left 05/2019  . TOTAL KNEE ARTHROPLASTY Right 10/14/2016   Procedure: RIGHT TOTAL KNEE ARTHROPLASTY;  Surgeon: Gaynelle Arabian, MD;  [facility];  Service: Orthopedics;  Laterality: Right;  Adductor Block  . TOTAL KNEE ARTHROPLASTY Left 04/24/2020   Procedure: TOTAL KNEE ARTHROPLASTY;  Surgeon: Gaynelle Arabian, MD;  Location[facility];  Service: Orthopedics;  Laterality: Left;  38mn    There were no vitals filed for this visit.   Subjective Assessment - 06/14/20 1103    Subjective pt states she cancelled her last appt because she was not feeling great.  STates her knee is really stiff  this morning.  No pain upon arrival but increases to 2/10 with activity.    Currently in Pain? No/denies                             [facility] Adult PT Treatment/Exercise - 06/14/20 0001      Knee/Hip Exercises: Stretches   Passive Hamstring Stretch Left;3 reps;30 seconds    Passive Hamstring Stretch Limitations standing    Knee: Self-Stretch to increase Flexion Left;5 reps;10 seconds    Knee: Self-Stretch Limitations knee drive on 62BW step     Gastroc Stretch 3 reps;30 seconds    Gastroc Stretch Limitations slant board      Knee/Hip Exercises: Aerobic   Stationary Bike 4' seat 15      Knee/Hip Exercises: Machines for Strengthening   Cybex Leg Press 30#  2 x 15       Knee/Hip Exercises: Standing   Heel Raises Both;2 sets;10 reps    Lateral Step Up Left;1 set;Hand Hold: 1;Step Height: 6";20 reps    Forward Step Up  Left;1 set;Hand Hold: 1;Step Height: 6";20 reps    Step Down Left;1 set;Hand Hold: 1;Step Height: 6";20 reps    Stairs 5RT 7" reciprocally       Knee/Hip Exercises: Supine   Knee Extension AROM    Knee Extension Limitations 5    Knee Flexion AAROM    Knee Flexion Limitations 112                    PT Short Term Goals - 05/29/20 1207      PT SHORT TERM GOAL #1   Title Patient will be independent with initial HEP and self-management strategies to improve functional outcomes    Baseline Reports compliance, but with questionable return    Time 2    Period Weeks    Status Partially Met    Target Date 05/26/20      PT SHORT TERM GOAL #2   Title Patient will have LT knee AROM 5-90 degrees to improve functional mobility and facilitate squatting to pick up items from floor.    Baseline Current 5-102 degrees    Time 3    Period Weeks    Status Achieved    Target Date 05/26/20             PT Long Term Goals - 05/29/20 1208      PT LONG TERM GOAL #1   Title Patient will have LT knee AROM 0-120 degrees to improve functional mobility and facilitate squatting to pick up items from floor.    Baseline Current 5-102 degrees    Time 6    Period Weeks    Status On-going      PT LONG TERM GOAL #2   Title Patient will be able to perform stand x 5 in < 15 seconds to demonstrate improvement in functional mobility and reduced risk for falls.    Baseline Current 10.15 seconds    Time 6    Period Weeks    Status Achieved      PT LONG TERM GOAL #3   Title Patient will improve FOTO score by 20% to indicate improvement in functional outcomes    Baseline Improved by 32%    Time 6    Period Weeks    Status Achieved      PT LONG TERM GOAL #4   Title Patient will be able to ambulate at least 300  feet during 2MWT with LRAD to demonstrate improved ability to perform functional mobility and associated tasks.    Baseline Current 565 feet with no AD    Time 6    Period Weeks     Status Achieved                 Plan - 06/14/20 1130    Clinical Impression Statement Pt with increases stiffness noted today with inability to completed full revolutions on bike with first trial.   Continued on with functional strengthening and ROM activities for Lt knee.  Worked on stair negotiation with ability to complete with some substitutions.  Measurements in flow sheet reflect last session and were not taken this visit. Pt with noted fatigue at end of session but without c/o pain.    Examination-Activity Limitations Locomotion Level;Lift;Transfers;Stand;Carry;Stairs;Bend;Squat;Sleep    Examination-Participation Restrictions Community Activity;Laundry;Yard Work;Cleaning    Stability/Clinical Decision Making Stable/Uncomplicated    Rehab Potential Good    PT Frequency 3x / week    PT Duration 6 weeks    PT Treatment/Interventions ADLs/Self Care Home Management;Aquatic Therapy;Biofeedback;Cryotherapy;Electrical Stimulation;Therapeutic activities;Iontophoresis 71m/ml Dexamethasone;Moist Heat;Traction;Balance training;Manual techniques;Manual lymph drainage;Therapeutic exercise;Taping;Vasopneumatic Device;Vestibular;Splinting;Functional mobility training;Energy conservation;Orthotic Fit/Training;Stair training;Gait training;DME Instruction;Passive range of motion;Patient/family education;Dry needling;Joint Manipulations;Spinal Manipulations;Compression bandaging;Scar mobilization;Neuromuscular re-education;Fluidtherapy;Contrast Bath;Parrafin;Ultrasound    PT Next Visit Plan Continue to progress knee AROM and functional strength as able.  Complete recert next visit.    PT Home Exercise Plan 05/01/20: ankle pump, quad set, glute set, heel slide; 10/25 LAQs 05/10/20: sit to stand, heel raise    Consulted and Agree with Plan of Care Patient           Patient will benefit from skilled therapeutic intervention in order to improve the following deficits and impairments:  Abnormal gait,  Increased edema, Decreased scar mobility, Decreased activity tolerance, Decreased balance, Decreased mobility, Difficulty walking, Pain, Increased fascial restricitons, Decreased strength, Decreased range of motion, Impaired flexibility, Improper body mechanics  Visit Diagnosis: Other abnormalities of gait and mobility  Stiffness of left knee, not elsewhere classified  Left knee pain, unspecified chronicity     Problem List Patient Active Problem List   Diagnosis Date Noted  . Primary osteoarthritis of right knee 04/24/2020  . Endometrial polyp 03/19/2020  . Fibroids, intramural 11/12/2019  . Hypertension   . Hypercholesteremia   . History of kidney stones   . Diabetes [place] without complication [place]   . Colon polyps   . OA (osteoarthritis) of knee 10/14/2016   ATeena Irani PTA/CLT [phone] FTeena Irani12/07/2019, 11:32 AM  [address] [place] [address]Phone: [phone]  Fax:  [phone] Name: DESTALENE BERGEYMRN: [id number] of Birth: 523-Jan-1952

## 2020-06-16 ENCOUNTER — Ambulatory Visit (HOSPITAL_COMMUNITY): Payer: Medicare HMO | Admitting: Physical Therapy

## 2020-06-16 ENCOUNTER — Other Ambulatory Visit: Payer: Self-pay

## 2020-06-16 DIAGNOSIS — Z1212 Encounter for screening for malignant neoplasm of rectum: Secondary | ICD-10-CM | POA: Diagnosis not present

## 2020-06-16 DIAGNOSIS — M25562 Pain in left knee: Secondary | ICD-10-CM | POA: Diagnosis not present

## 2020-06-16 DIAGNOSIS — M25662 Stiffness of left knee, not elsewhere classified: Secondary | ICD-10-CM | POA: Diagnosis not present

## 2020-06-16 DIAGNOSIS — R2689 Other abnormalities of gait and mobility: Secondary | ICD-10-CM

## 2020-06-16 NOTE — Therapy (Signed)
[facility] [address] [place], [place], [address] Phone: [phone]   Fax:  [phone]  Physical Therapy Treatment  Patient Details  Name: Amy Reyes MRN: [id number] Date of Birth: 05-22-1951 Referring Provider (PT): Theresa Duty Pa-C   Progress Note Reporting Period 11/17  to 06/16/2020  See note below for Objective Data and Assessment of Progress/Goals.          Encounter Date: 06/16/2020   PT End of Session - 06/16/20 1700    Visit Number 16    Number of Visits 24    Date for PT Re-Evaluation 07/07/20    Authorization Type [employer]    Authorization Time Period approved 12 vits from 11/22-12/30    Authorization - Visit Number 4    Authorization - Number of Visits 12    Progress Note Due on Visit 24    PT Start Time 1630   PT late   PT Stop Time 1700    PT Time Calculation (min) 30 min    Activity Tolerance Patient tolerated treatment well    Behavior During Therapy WFL for tasks assessed/performed           Past Medical History:  Diagnosis Date  . Allergy    seasonal allergies  . Anemia    iron defiencey  . Arthritis    oa  . Bronchitis hx of  . Chronic kidney disease    ckd stage 2- 3 due to NSAIDS use no nephrologist  . Colon polyps   . Complication of anesthesia    slow to wake up one time, none recent  . Diabetes mellitus without complication ([place])    borderline type 2 on metformin  . GERD (gastroesophageal reflux disease)    on meds  . History of carpal tunnel syndrome    years ago  . History of kidney stones   . History of rotator cuff tear    Bilateral  . Hypercholesteremia    on meds  . Hypertension    on meds  . Peptic ulcer   . PMB (postmenopausal bleeding)    last time middle of april 2021    Past Surgical History:  Procedure Laterality Date  . ABDOMINOPLASTY  30 yrs ago   tummy tuck  . BREAST SURGERY     reduction  . CHOLECYSTECTOMY  2000   laparoscopic  .  COLONOSCOPY  2019   TA  . DILATATION & CURETTAGE/HYSTEROSCOPY WITH MYOSURE N/A 12/20/2019   Procedure: DILATATION & CURETTAGE/HYSTEROSCOPY WITH MYOSURE RESECTION OF POLYP;  Surgeon: Megan Salon, MD;  Location: [facility];  Service: Gynecology;  Laterality: N/A;  resection of polyp,  to follow first case  . INJECTION KNEE Left 10/14/2016   Procedure: CORTISONE LEFT KNEE INJECTION;  Surgeon: Gaynelle Arabian, MD;  Location: [facility];  Service: Orthopedics;  Laterality: Left;  . LAPAROSCOPIC GASTRIC BANDING  2010 or 2011  . left knee injection  6 weeks ago  . POLYPECTOMY     TA  . ROTATOR CUFF REPAIR Left 05/2019  . TOTAL KNEE ARTHROPLASTY Right 10/14/2016   Procedure: RIGHT TOTAL KNEE ARTHROPLASTY;  Surgeon: Gaynelle Arabian, MD;  Location: [facility];  Service: Orthopedics;  Laterality: Right;  Adductor Block  . TOTAL KNEE ARTHROPLASTY Left 04/24/2020   Procedure: TOTAL KNEE ARTHROPLASTY;  Surgeon: Gaynelle Arabian, MD;  Location: [facility];  Service: Orthopedics;  Laterality: Left;  5mn    There were no vitals filed for this visit.  Subjective Assessment - 06/16/20 1631    Subjective PT states that she feels that she is about 80% better ; she states that she is having the most trouble bending her knee,  and stair climbing.    Pertinent History LT TKA 04/24/20    Limitations Sitting;Standing;Lifting;Walking;House hold activities    How long can you sit comfortably? no problem    How long can you stand comfortably? as long as she wants to    How long can you walk comfortably? able to walk as long as she wants she just gets tired    Patient Stated Goals do better than I was doing, get back into gym    Currently in Pain? No/denies              [facility] PT Assessment - 06/16/20 0001      Assessment   Medical Diagnosis LT TKA     Referring Provider (PT) Theresa Duty Pa-C     Onset Date/Surgical Date 04/24/20      Precautions   Precautions None      Restrictions   Weight Bearing  Restrictions No      Home Environment   Living Environment Private residence      Prior Function   Level of Independence Independent      Observation/Other Assessments   Focus on Therapeutic Outcomes (FOTO)  35% limited    was 67% limited      Functional Tests   Functional tests Single leg stance      Single Leg Stance   Comments 60"       ROM / Strength   AROM / PROM / Strength Strength      AROM   Left Knee Extension 3    Left Knee Flexion 108      Strength   Strength Assessment Site Hip;Knee    Right/Left Hip Right;Left    Right Hip Flexion 5/5    Right Hip Extension 4/5    Right Hip ABduction 5/5    Left Hip Flexion 5/5    Left Hip Extension 4+/5    Right/Left Knee Right;Left    Right Knee Flexion 5/5    Right Knee Extension 5/5    Left Knee Flexion 4-/5    Left Knee Extension 4/5      Transfers   Five time sit to stand comments  9.32 was 10.15 sec with no UEs       Ambulation/Gait   Ambulation/Gait Yes    Ambulation/Gait Assistance 7: Independent    Ambulation Distance (Feet) 565 Feet    Assistive device None    Gait Pattern Antalgic;Decreased stance time - left    Gait Comments 2MWT                         [facility] Adult PT Treatment/Exercise - 06/16/20 0001      Exercises   Exercises Knee/Hip      Knee/Hip Exercises: Stretches   Quad Stretch 3 reps;30 seconds    Quad Stretch Limitations prone wiht rope      Knee/Hip Exercises: Aerobic   Stationary Bike seat 13 x 5 ' full rotation for ROM       Knee/Hip Exercises: Supine   Quad Sets Both;10 reps    Short Arc Quad Sets Both;10 reps    Heel Slides Left;10 reps    Knee Extension Limitations 3    Knee Flexion Limitations 108  PT Short Term Goals - 05/29/20 1207      PT SHORT TERM GOAL #1   Title Patient will be independent with initial HEP and self-management strategies to improve functional outcomes    Baseline Reports compliance, but with  questionable return    Time 2    Period Weeks    Status Partially Met    Target Date 05/26/20      PT SHORT TERM GOAL #2   Title Patient will have LT knee AROM 5-90 degrees to improve functional mobility and facilitate squatting to pick up items from floor.    Baseline Current 5-102 degrees    Time 3    Period Weeks    Status Achieved    Target Date 05/26/20             PT Long Term Goals - 05/29/20 1208      PT LONG TERM GOAL #1   Title Patient will have LT knee AROM 0-120 degrees to improve functional mobility and facilitate squatting to pick up items from floor.    Baseline Current 5-102 degrees    Time 6    Period Weeks    Status On-going      PT LONG TERM GOAL #2   Title Patient will be able to perform stand x 5 in < 15 seconds to demonstrate improvement in functional mobility and reduced risk for falls.    Baseline Current 10.15 seconds    Time 6    Period Weeks    Status Achieved      PT LONG TERM GOAL #3   Title Patient will improve FOTO score by 20% to indicate improvement in functional outcomes    Baseline Improved by 32%    Time 6    Period Weeks    Status Achieved      PT LONG TERM GOAL #4   Title Patient will be able to ambulate at least 300 feet during 2MWT with LRAD to demonstrate improved ability to perform functional mobility and associated tasks.    Baseline Current 565 feet with no AD    Time 6    Period Weeks    Status Achieved                 Plan - 06/16/20 1701    Clinical Impression Statement Pt reassessed she will continue to benefit from skilled therapy to focus on increasing her flexion and increasing B glut max strength.    Examination-Activity Limitations Locomotion Level;Lift;Transfers;Stand;Carry;Stairs;Bend;Squat;Sleep    Examination-Participation Restrictions Community Activity;Laundry;Yard Work;Cleaning    Stability/Clinical Decision Making Stable/Uncomplicated    Rehab Potential Good    PT Frequency 3x / week    PT  Duration 6 weeks    PT Treatment/Interventions ADLs/Self Care Home Management;Aquatic Therapy;Biofeedback;Cryotherapy;Electrical Stimulation;Therapeutic activities;Iontophoresis 58m/ml Dexamethasone;Moist Heat;Traction;Balance training;Manual techniques;Manual lymph drainage;Therapeutic exercise;Taping;Vasopneumatic Device;Vestibular;Splinting;Functional mobility training;Energy conservation;Orthotic Fit/Training;Stair training;Gait training;DME Instruction;Passive range of motion;Patient/family education;Dry needling;Joint Manipulations;Spinal Manipulations;Compression bandaging;Scar mobilization;Neuromuscular re-education;Fluidtherapy;Contrast Bath;Parrafin;Ultrasound    PT Next Visit Plan Continue to progress knee AROM and functional strength    PT Home Exercise Plan 05/01/20: ankle pump, quad set, glute set, heel slide; 10/25 LAQs 05/10/20: sit to stand, heel raise    Consulted and Agree with Plan of Care Patient           Patient will benefit from skilled therapeutic intervention in order to improve the following deficits and impairments:  Abnormal gait, Increased edema, Decreased scar mobility, Decreased activity tolerance, Decreased balance, Decreased mobility, Difficulty walking, Pain, Increased  fascial restricitons, Decreased strength, Decreased range of motion, Impaired flexibility, Improper body mechanics  Visit Diagnosis: Other abnormalities of gait and mobility  Stiffness of left knee, not elsewhere classified  Left knee pain, unspecified chronicity     Problem List Patient Active Problem List   Diagnosis Date Noted  . Primary osteoarthritis of right knee 04/24/2020  . Endometrial polyp 03/19/2020  . Fibroids, intramural 11/12/2019  . Hypertension   . Hypercholesteremia   . History of kidney stones   . Diabetes mellitus without complication ([place])   . Colon polyps   . OA (osteoarthritis) of knee 10/14/2016  Rayetta Humphrey, PT CLT [phone] 06/16/2020, 5:06  PM  [facility] [address] [place], [place], [address] Phone: [phone]   Fax:  [phone]  Name: Amy Reyes MRN: [id number] Date of Birth: 1951/06/06

## 2020-06-20 ENCOUNTER — Other Ambulatory Visit: Payer: Self-pay

## 2020-06-20 ENCOUNTER — Encounter: Payer: Self-pay | Admitting: Obstetrics and Gynecology

## 2020-06-20 ENCOUNTER — Ambulatory Visit (INDEPENDENT_AMBULATORY_CARE_PROVIDER_SITE_OTHER): Payer: Medicare HMO | Admitting: Obstetrics and Gynecology

## 2020-06-20 VITALS — BP 118/62 | HR 86 | Ht 66.0 in | Wt 207.4 lb

## 2020-06-20 DIAGNOSIS — N950 Postmenopausal bleeding: Secondary | ICD-10-CM

## 2020-06-20 NOTE — Progress Notes (Signed)
GYNECOLOGY  VISIT   HPI: 69 y.o.   Widowed Black or [place] American Not Hispanic or Latino  female   [phone] with No LMP recorded. Patient is postmenopausal.   here for post menopausal bleeding every time she urinates. She had not seen any spotting since September. She says that after thanksgiving she had a cramp and when to the restroom there was blood. She says that it stopped after 6 days. Not sexually active.   The patient is s/p hysteroscopy, polypectomy and D&C with Dr Sabra Heck in 6/21. Pathology was negative.   Pap from 4/21 was negative with endometrial cells.   GYNECOLOGIC HISTORY: No LMP recorded. Patient is postmenopausal. Contraception:none  Menopausal hormone therapy: none         OB History    Gravida  3   Para      Term      Preterm      AB  1   Living  2     SAB      TAB  1   Ectopic      Multiple      Live Births                 Patient Active Problem List   Diagnosis Date Noted  . Primary osteoarthritis of right knee 04/24/2020  . Endometrial polyp 03/19/2020  . Fibroids, intramural 11/12/2019  . Hypertension   . Hypercholesteremia   . History of kidney stones   . Diabetes mellitus without complication ([place])   . Colon polyps   . OA (osteoarthritis) of knee 10/14/2016    Past Medical History:  Diagnosis Date  . Allergy    seasonal allergies  . Anemia    iron defiencey  . Arthritis    oa  . Bronchitis hx of  . Chronic kidney disease    ckd stage 2- 3 due to NSAIDS use no nephrologist  . Colon polyps   . Complication of anesthesia    slow to wake up one time, none recent  . Diabetes mellitus without complication ([place])    borderline type 2 on metformin  . GERD (gastroesophageal reflux disease)    on meds  . History of carpal tunnel syndrome    years ago  . History of kidney stones   . History of rotator cuff tear    Bilateral  . Hypercholesteremia    on meds  . Hypertension    on meds  . Peptic ulcer   . PMB  (postmenopausal bleeding)    last time middle of april 2021    Past Surgical History:  Procedure Laterality Date  . ABDOMINOPLASTY  30 yrs ago   tummy tuck  . BREAST SURGERY     reduction  . CHOLECYSTECTOMY  2000   laparoscopic  . COLONOSCOPY  2019   TA  . DILATATION & CURETTAGE/HYSTEROSCOPY WITH MYOSURE N/A 12/20/2019   Procedure: DILATATION & CURETTAGE/HYSTEROSCOPY WITH MYOSURE RESECTION OF POLYP;  Surgeon: Megan Salon, MD;  Location: [facility];  Service: Gynecology;  Laterality: N/A;  resection of polyp,  to follow first case  . INJECTION KNEE Left 10/14/2016   Procedure: CORTISONE LEFT KNEE INJECTION;  Surgeon: Gaynelle Arabian, MD;  Location: [facility];  Service: Orthopedics;  Laterality: Left;  . LAPAROSCOPIC GASTRIC BANDING  2010 or 2011  . left knee injection  6 weeks ago  . POLYPECTOMY     TA  . ROTATOR CUFF REPAIR Left 05/2019  . TOTAL KNEE ARTHROPLASTY  Right 10/14/2016   Procedure: RIGHT TOTAL KNEE ARTHROPLASTY;  Surgeon: Gaynelle Arabian, MD;  Location: [facility];  Service: Orthopedics;  Laterality: Right;  Adductor Block  . TOTAL KNEE ARTHROPLASTY Left 04/24/2020   Procedure: TOTAL KNEE ARTHROPLASTY;  Surgeon: Gaynelle Arabian, MD;  Location: [facility];  Service: Orthopedics;  Laterality: Left;  20min    Current Outpatient Medications  Medication Sig Dispense Refill  . atorvastatin (LIPITOR) 80 MG tablet Take 80 mg by mouth daily.     . cyclobenzaprine (FLEXERIL) 10 MG tablet Take 1 tablet (10 mg total) by mouth 3 (three) times daily as needed for muscle spasms. 40 tablet 0  . ferrous sulfate 325 (65 FE) MG tablet Take 325 mg by mouth daily with breakfast.    . gabapentin (NEURONTIN) 300 MG capsule Take a 300 mg capsule three times a day for two weeks following surgery.Then take a 300 mg capsule two times a day for two weeks. Then take a 300 mg capsule once a day for two weeks. Then discontinue. 84 capsule 0  . losartan-hydrochlorothiazide (HYZAAR) 100-25 MG tablet Take 1  tablet by mouth daily.    . metFORMIN (GLUCOPHAGE) 500 MG tablet Take 500 mg by mouth daily.    Marland Kitchen omeprazole (PRILOSEC) 40 MG capsule TAKE 1 CAPSULE BY MOUTH TWICE A DAY (Patient taking differently: Take 40 mg by mouth daily. ) 180 capsule 2  . oxyCODONE (OXY IR/ROXICODONE) 5 MG immediate release tablet Take 1-2 tablets (5-10 mg total) by mouth every 6 (six) hours as needed for moderate pain or severe pain. 42 tablet 0  . promethazine (PHENERGAN) 12.5 MG tablet Take 1 tablet (12.5 mg total) by mouth every 6 (six) hours as needed for nausea or vomiting. 30 tablet 0  . rivaroxaban (XARELTO) 10 MG TABS tablet Take 1 tablet (10 mg total) by mouth daily with breakfast for 20 days. Then take one 81 mg aspirin once a day for three weeks. Then discontinue aspirin. 20 tablet 0   No current facility-administered medications for this visit.     ALLERGIES: Tramadol and Codeine  Family History  Problem Relation Age of Onset  . Hypertension Mother   . Thyroid disease Mother   . Kidney disease Sister   . Hypertension Sister   . Diabetes Maternal Grandfather   . Diabetes Maternal Aunt   . Diabetes Maternal Uncle   . Prostate cancer Father 52  . Colon cancer Father 50  . Colon polyps Father 69  . Hypertension Brother   . Uterine cancer Maternal Aunt   . Rectal cancer Neg Hx   . Stomach cancer Neg Hx     Social History   Socioeconomic History  . Marital status: Widowed    Spouse name: 2  . Number of children: Not on file  . Years of education: Not on file  . Highest education level: Not on file  Occupational History  . Occupation: retired  Tobacco Use  . Smoking status: Former Smoker    Years: 1.00    Types: Cigarettes    Quit date: 05/16/1983    Years since quitting: 37.1  . Smokeless tobacco: Never Used  Vaping Use  . Vaping Use: Never used  Substance and Sexual Activity  . Alcohol use: Yes    Alcohol/week: 2.0 standard drinks    Types: 2 Glasses of wine per week  . Drug use: No   . Sexual activity: Yes    Birth control/protection: Post-menopausal    Comment: rare  Other Topics  Concern  . Not on file  Social History Narrative  . Not on file   Social Determinants of Health   Financial Resource Strain:   . Difficulty of Paying Living Expenses: Not on file  Food Insecurity:   . Worried About Charity fundraiser in the Last Year: Not on file  . Ran Out of Food in the Last Year: Not on file  Transportation Needs:   . Lack of Transportation (Medical): Not on file  . Lack of Transportation (Non-Medical): Not on file  Physical Activity:   . Days of Exercise per Week: Not on file  . Minutes of Exercise per Session: Not on file  Stress:   . Feeling of Stress : Not on file  Social Connections:   . Frequency of Communication with Friends and Family: Not on file  . Frequency of Social Gatherings with Friends and Family: Not on file  . Attends Religious Services: Not on file  . Active Member of Clubs or Organizations: Not on file  . Attends Archivist Meetings: Not on file  . Marital Status: Not on file  Intimate Partner Violence:   . Fear of Current or Ex-Partner: Not on file  . Emotionally Abused: Not on file  . Physically Abused: Not on file  . Sexually Abused: Not on file    Review of Systems  All other systems reviewed and are negative.   PHYSICAL EXAMINATION:    There were no vitals taken for this visit.    General appearance: alert, cooperative and appears stated age   Pelvic: External genitalia:  no lesions              Urethra:  normal appearing urethra with no masses, tenderness or lesions              Bartholins and Skenes: normal                 Vagina: mildly atrophic appearing vagina with normal color and discharge, no lesions. Not friable.              Cervix: no lesions and not friable, small amount of brown d/c at the opening the vagina (guiac +)              Bimanual Exam:  Uterus:  10 week sized, irregular, mobile, not  tender uterus (stable),               Adnexa: no mass, fullness, tenderness               Chaperone was present for exam.  Ultrasound images from 11/11/19 and hysteroscopy images from 12/20/19  ASSESSMENT Postmenopausal spotting 6 months s/p hysteroscopy, polypectomy, D&C. Normal exam except for spot of blood at the external os and stable enlarged uterus (known fibroids)    PLAN Return for GYN ultrasound   Over 20 minutes spent in total patient care

## 2020-06-21 ENCOUNTER — Telehealth: Payer: Self-pay

## 2020-06-21 ENCOUNTER — Ambulatory Visit (HOSPITAL_COMMUNITY): Payer: Medicare HMO | Admitting: Physical Therapy

## 2020-06-21 DIAGNOSIS — M25562 Pain in left knee: Secondary | ICD-10-CM

## 2020-06-21 DIAGNOSIS — M25662 Stiffness of left knee, not elsewhere classified: Secondary | ICD-10-CM | POA: Diagnosis not present

## 2020-06-21 DIAGNOSIS — R2689 Other abnormalities of gait and mobility: Secondary | ICD-10-CM

## 2020-06-21 NOTE — Telephone Encounter (Signed)
Spoke with patient regarding benefits for recommended ultrasound. Patient is aware that ultrasound is transvaginal. Patient acknowledges understanding of information presented. Patient is aware of cancellation policy. Patient scheduled appointment for 07/20/2020 at 1030AM with Sumner Boast, MD. Patient stated that she is going to check her appointment schedule and will call back if she is able to schedule for an earlier date.  Patient requesting to speak with Dr. Gentry Fitz nurse regarding additional information that she forgot to tell her at her most recent visit. Patient stated that she forgot to tell Dr. Talbert Nan that she was accidentally taking Xarelto incorrectly after her knee surgery (04/24/2020). Patient stated that she knows that can cause bleeding and is unsure if that could contribute to her symptoms.   Routing call to Triage.

## 2020-06-21 NOTE — Therapy (Signed)
[place], [place], [address] Phone: [phone]   Fax:  [phone]  Physical Therapy Treatment  Patient Details  Name: Amy Reyes MRN: [id number] Date of Birth: 18-Dec-1950 Referring Provider (PT): Theresa Duty Pa-C    Encounter Date: 06/21/2020   PT End of Session - 06/21/20 1219    Visit Number 17    Number of Visits 24    Date for PT Re-Evaluation 07/07/20    Authorization Type [employer]    Authorization Time Period approved 12 vits from 11/22-12/30    Authorization - Visit Number 5    Authorization - Number of Visits 12    Progress Note Due on Visit 24    PT Start Time 0922    PT Stop Time 1000    PT Time Calculation (min) 38 min    Activity Tolerance Patient tolerated treatment well    Behavior During Therapy [facility] for tasks assessed/performed           Past Medical History:  Diagnosis Date  . Allergy    seasonal allergies  . Anemia    iron defiencey  . Arthritis    oa  . Bronchitis hx of  . Chronic kidney disease    ckd stage 2- 3 due to NSAIDS use no nephrologist  . Colon polyps   . Complication of anesthesia    slow to wake up one time, none recent  . Diabetes mellitus without complication ([place])    borderline type 2 on metformin  . GERD (gastroesophageal reflux disease)    on meds  . History of carpal tunnel syndrome    years ago  . History of kidney stones   . History of rotator cuff tear    Bilateral  . Hypercholesteremia    on meds  . Hypertension    on meds  . Peptic ulcer   . PMB (postmenopausal bleeding)    last time middle of april 2021    Past Surgical History:  Procedure Laterality Date  . ABDOMINOPLASTY  30 yrs ago   tummy tuck  . BREAST SURGERY     reduction  . CHOLECYSTECTOMY  2000   laparoscopic  . COLONOSCOPY  2019   TA  . DILATATION & CURETTAGE/HYSTEROSCOPY WITH MYOSURE N/A 12/20/2019   Procedure: DILATATION & CURETTAGE/HYSTEROSCOPY WITH  MYOSURE RESECTION OF POLYP;  Surgeon: Megan Salon, MD;  Location: [facility];  Service: Gynecology;  Laterality: N/A;  resection of polyp,  to follow first case  . INJECTION KNEE Left 10/14/2016   Procedure: CORTISONE LEFT KNEE INJECTION;  Surgeon: Gaynelle Arabian, MD;  Location: [facility];  Service: Orthopedics;  Laterality: Left;  . LAPAROSCOPIC GASTRIC BANDING  2010 or 2011  . left knee injection  6 weeks ago  . POLYPECTOMY     TA  . ROTATOR CUFF REPAIR Left 05/2019  . TOTAL KNEE ARTHROPLASTY Right 10/14/2016   Procedure: RIGHT TOTAL KNEE ARTHROPLASTY;  Surgeon: Gaynelle Arabian, MD;  Location: [facility];  Service: Orthopedics;  Laterality: Right;  Adductor Block  . TOTAL KNEE ARTHROPLASTY Left 04/24/2020   Procedure: TOTAL KNEE ARTHROPLASTY;  Surgeon: Gaynelle Arabian, MD;  Location: [facility];  Service: Orthopedics;  Laterality: Left;  31mn    There were no vitals filed for this visit.   Subjective Assessment - 06/21/20 1222    Subjective pt states she is a little stiff but having no pain.    Currently in Pain? No/denies  [facility] Adult PT Treatment/Exercise - 06/21/20 0001      Knee/Hip Exercises: Stretches   Knee: Self-Stretch to increase Flexion Left;5 reps;10 seconds    Knee: Self-Stretch Limitations knee drive on 90WI step       Knee/Hip Exercises: Aerobic   Stationary Bike seat 12 x 5 ' full rotation for ROM       Knee/Hip Exercises: Standing   Heel Raises Both;2 sets;10 reps    Lateral Step Up Left;1 set;Hand Hold: 1;Step Height: 6";20 reps    Forward Step Up Left;1 set;Hand Hold: 1;Step Height: 6";20 reps    Step Down Left;1 set;Hand Hold: 1;Step Height: 6";20 reps    Stairs 5RT 7" reciprocally                     PT Short Term Goals - 05/29/20 1207      PT SHORT TERM GOAL #1   Title Patient will be independent with initial HEP and self-management strategies to improve functional outcomes    Baseline Reports  compliance, but with questionable return    Time 2    Period Weeks    Status Partially Met    Target Date 05/26/20      PT SHORT TERM GOAL #2   Title Patient will have LT knee AROM 5-90 degrees to improve functional mobility and facilitate squatting to pick up items from floor.    Baseline Current 5-102 degrees    Time 3    Period Weeks    Status Achieved    Target Date 05/26/20             PT Long Term Goals - 05/29/20 1208      PT LONG TERM GOAL #1   Title Patient will have LT knee AROM 0-120 degrees to improve functional mobility and facilitate squatting to pick up items from floor.    Baseline Current 5-102 degrees    Time 6    Period Weeks    Status On-going      PT LONG TERM GOAL #2   Title Patient will be able to perform stand x 5 in < 15 seconds to demonstrate improvement in functional mobility and reduced risk for falls.    Baseline Current 10.15 seconds    Time 6    Period Weeks    Status Achieved      PT LONG TERM GOAL #3   Title Patient will improve FOTO score by 20% to indicate improvement in functional outcomes    Baseline Improved by 32%    Time 6    Period Weeks    Status Achieved      PT LONG TERM GOAL #4   Title Patient will be able to ambulate at least 300 feet during 2MWT with LRAD to demonstrate improved ability to perform functional mobility and associated tasks.    Baseline Current 565 feet with no AD    Time 6    Period Weeks    Status Achieved                 Plan - 06/21/20 1221    Clinical Impression Statement Focus today on Lt knee flexion and eccentric focus for stair negotiation.  Pt able to start with full revolutions on bike today with increased seat distance of 12.  Worked on repeated steps and eccentric control as well as functional stair negotiation on 4" and 7" stairwell sides.   Pt required use of 1 UE on stairwell today to  complete quality stair negotiation.    Examination-Activity Limitations Locomotion  Level;Lift;Transfers;Stand;Carry;Stairs;Bend;Squat;Sleep    Examination-Participation Restrictions Community Activity;Laundry;Yard Work;Cleaning    Stability/Clinical Decision Making Stable/Uncomplicated    Rehab Potential Good    PT Frequency 3x / week    PT Duration 6 weeks    PT Treatment/Interventions ADLs/Self Care Home Management;Aquatic Therapy;Biofeedback;Cryotherapy;Electrical Stimulation;Therapeutic activities;Iontophoresis 54m/ml Dexamethasone;Moist Heat;Traction;Balance training;Manual techniques;Manual lymph drainage;Therapeutic exercise;Taping;Vasopneumatic Device;Vestibular;Splinting;Functional mobility training;Energy conservation;Orthotic Fit/Training;Stair training;Gait training;DME Instruction;Passive range of motion;Patient/family education;Dry needling;Joint Manipulations;Spinal Manipulations;Compression bandaging;Scar mobilization;Neuromuscular re-education;Fluidtherapy;Contrast Bath;Parrafin;Ultrasound    PT Next Visit Plan Continue to progress knee AROM and functional strength    PT Home Exercise Plan 05/01/20: ankle pump, quad set, glute set, heel slide; 10/25 LAQs 05/10/20: sit to stand, heel raise    Consulted and Agree with Plan of Care Patient           Patient will benefit from skilled therapeutic intervention in order to improve the following deficits and impairments:  Abnormal gait, Increased edema, Decreased scar mobility, Decreased activity tolerance, Decreased balance, Decreased mobility, Difficulty walking, Pain, Increased fascial restricitons, Decreased strength, Decreased range of motion, Impaired flexibility, Improper body mechanics  Visit Diagnosis: Left knee pain, unspecified chronicity  Stiffness of left knee, not elsewhere classified  Other abnormalities of gait and mobility     Problem List Patient Active Problem List   Diagnosis Date Noted  . Primary osteoarthritis of right knee 04/24/2020  . Endometrial polyp 03/19/2020  . Fibroids,  intramural 11/12/2019  . Hypertension   . Hypercholesteremia   . History of kidney stones   . [place]   . Colon polyps   . OA (osteoarthritis) of knee 10/14/2016   ATeena Irani PTA/CLT [id number] FTeena Irani12/02/2020, 12:24 PM  [place] [address]Phone: [phone]  Fax:  [phone] Name: Amy LESHMRN: [id number] of Birth: [birth year]

## 2020-06-21 NOTE — Telephone Encounter (Signed)
Spoke with patient. Confirmed patient is no longer taking Xarelto, stopped 3-4 weeks ago.  Advised bleeding can be more easily than normal when taking Xarelto, but not likely the cause of PMB. Advised further evaluation of PMB is recommended, patient is agreeable. Questions answered. Advised patient I will update Dr. Talbert Nan, our office will return call if any additional recommendations.   Routing to provider for final review. Patient is agreeable to disposition. Will close encounter.

## 2020-06-22 ENCOUNTER — Telehealth: Payer: Self-pay

## 2020-06-22 ENCOUNTER — Other Ambulatory Visit: Payer: Self-pay

## 2020-06-22 ENCOUNTER — Ambulatory Visit (HOSPITAL_COMMUNITY): Payer: Medicare HMO | Admitting: Physical Therapy

## 2020-06-22 ENCOUNTER — Encounter (HOSPITAL_COMMUNITY): Payer: Self-pay | Admitting: Physical Therapy

## 2020-06-22 DIAGNOSIS — M25562 Pain in left knee: Secondary | ICD-10-CM

## 2020-06-22 DIAGNOSIS — R2689 Other abnormalities of gait and mobility: Secondary | ICD-10-CM

## 2020-06-22 DIAGNOSIS — M25662 Stiffness of left knee, not elsewhere classified: Secondary | ICD-10-CM

## 2020-06-22 NOTE — Telephone Encounter (Signed)
Call to patient. Patient requesting to move ultrasound appointment with Dr. Talbert Nan up to December 16th. Patient scheduled 06/29/2020 at 0900AM. Encounter closed.

## 2020-06-22 NOTE — Telephone Encounter (Signed)
Patient is calling to speak with Hayley.

## 2020-06-22 NOTE — Therapy (Signed)
[place], [place], [address] Phone: [phone]   Fax:  [phone]  Physical Therapy Treatment  Patient Details  Name: Amy Reyes MRN: [id number] Date of Birth: 24-Jul-1950 Referring Provider (PT): Theresa Duty Pa-C    Encounter Date: 06/22/2020   PT End of Session - 06/22/20 1135    Visit Number 18    Number of Visits 24    Date for PT Re-Evaluation 07/07/20    Authorization Type [employer]    Authorization Time Period approved 12 vits from 11/22-12/30    Authorization - Visit Number 6    Authorization - Number of Visits 12    Progress Note Due on Visit 24    PT Start Time 1135    PT Stop Time 1215    PT Time Calculation (min) 40 min    Activity Tolerance Patient tolerated treatment well    Behavior During Therapy [facility] for tasks assessed/performed           Past Medical History:  Diagnosis Date  . Allergy    seasonal allergies  . Anemia    iron defiencey  . Arthritis    oa  . Bronchitis hx of  . Chronic kidney disease    ckd stage 2- 3 due to NSAIDS use no nephrologist  . Colon polyps   . Complication of anesthesia    slow to wake up one time, none recent  . Diabetes mellitus without complication ([place])    borderline type 2 on metformin  . GERD (gastroesophageal reflux disease)    on meds  . History of carpal tunnel syndrome    years ago  . History of kidney stones   . History of rotator cuff tear    Bilateral  . Hypercholesteremia    on meds  . Hypertension    on meds  . Peptic ulcer   . PMB (postmenopausal bleeding)    last time middle of april 2021    Past Surgical History:  Procedure Laterality Date  . ABDOMINOPLASTY  30 yrs ago   tummy tuck  . BREAST SURGERY     reduction  . CHOLECYSTECTOMY  2000   laparoscopic  . COLONOSCOPY  2019   TA  . DILATATION & CURETTAGE/HYSTEROSCOPY WITH MYOSURE N/A 12/20/2019   Procedure: DILATATION & CURETTAGE/HYSTEROSCOPY WITH  MYOSURE RESECTION OF POLYP;  Surgeon: Megan Salon, MD;  Location: [facility];  Service: Gynecology;  Laterality: N/A;  resection of polyp,  to follow first case  . INJECTION KNEE Left 10/14/2016   Procedure: CORTISONE LEFT KNEE INJECTION;  Surgeon: Gaynelle Arabian, MD;  [facility];  Service: Orthopedics;  Laterality: Left;  . LAPAROSCOPIC GASTRIC BANDING  2010 or 2011  . left knee injection  6 weeks ago  . POLYPECTOMY     TA  . ROTATOR CUFF REPAIR Left 05/2019  . TOTAL KNEE ARTHROPLASTY Right 10/14/2016   Procedure: RIGHT TOTAL KNEE ARTHROPLASTY;  Surgeon: Gaynelle Arabian, MD;  [facility];  Service: Orthopedics;  Laterality: Right;  Adductor Block  . TOTAL KNEE ARTHROPLASTY Left 04/24/2020   Procedure: TOTAL KNEE ARTHROPLASTY;  Surgeon: Gaynelle Arabian, MD;  [facility];  Service: Orthopedics;  Laterality: Left;  69mn    There were no vitals filed for this visit.   Subjective Assessment - 06/22/20 1135    Subjective Patient states she if feeling good today. Her arm hurts but thats all.    Pertinent History LT TKA  04/24/20    Limitations Sitting;Standing;Lifting;Walking;House hold activities    How long can you sit comfortably? no problem    How long can you stand comfortably? as long as she wants to    How long can you walk comfortably? able to walk as long as she wants she just gets tired    Patient Stated Goals do better than I was doing, get back into gym    Currently in Pain? No/denies                             [facility] Adult PT Treatment/Exercise - 06/22/20 0001      Knee/Hip Exercises: Stretches   Quad Stretch 3 reps;30 seconds    Quad Stretch Limitations prone with rope      Knee/Hip Exercises: Aerobic   Stationary Bike seat 12 x 5 ' full rotation for ROM       Knee/Hip Exercises: Standing   Lateral Step Up Left;2 sets;Step Height: 4";15 reps;20 reps    Lateral Step Up Limitations eccentric control    Step Down Left;Hand  Hold: 1;20 reps;Step Height: 4";2 sets    Step Down Limitations eccentric control      Knee/Hip Exercises: Supine   Knee Extension AROM    Knee Extension Limitations 3    Knee Flexion AROM    Knee Flexion Limitations 105   improves to 111 following contract relax     Manual Therapy   Manual Therapy Muscle Energy Technique    Manual therapy comments completed independently from all other aspects of treatment    Muscle Energy Technique contract relax for flexion in seated                  PT Education - 06/22/20 1137    Education Details Patient educated on HEP, exercise mechanics    Person(s) Educated Patient    Methods Explanation;Demonstration    Comprehension Verbalized understanding;Returned demonstration            PT Short Term Goals - 05/29/20 1207      PT SHORT TERM GOAL #1   Title Patient will be independent with initial HEP and self-management strategies to improve functional outcomes    Baseline Reports compliance, but with questionable return    Time 2    Period Weeks    Status Partially Met    Target Date 05/26/20      PT SHORT TERM GOAL #2   Title Patient will have LT knee AROM 5-90 degrees to improve functional mobility and facilitate squatting to pick up items from floor.    Baseline Current 5-102 degrees    Time 3    Period Weeks    Status Achieved    Target Date 05/26/20             PT Long Term Goals - 05/29/20 1208      PT LONG TERM GOAL #1   Title Patient will have LT knee AROM 0-120 degrees to improve functional mobility and facilitate squatting to pick up items from floor.    Baseline Current 5-102 degrees    Time 6    Period Weeks    Status On-going      PT LONG TERM GOAL #2   Title Patient will be able to perform stand x 5 in < 15 seconds to demonstrate improvement in functional mobility and reduced risk for falls.    Baseline Current 10.15 seconds  Time 6    Period Weeks    Status Achieved      PT LONG TERM GOAL #3    Title Patient will improve FOTO score by 20% to indicate improvement in functional outcomes    Baseline Improved by 32%    Time 6    Period Weeks    Status Achieved      PT LONG TERM GOAL #4   Title Patient will be able to ambulate at least 300 feet during 2MWT with LRAD to demonstrate improved ability to perform functional mobility and associated tasks.    Baseline Current 565 feet with no AD    Time 6    Period Weeks    Status Achieved                 Plan - 06/22/20 1135    Clinical Impression Statement Patient initially unable to perform full revolutions on bike secondary to L knee stiffness at beginning of session. Knee ROM improves and patient able to complete full revolutions for conditioning after about 2 minutes of rocking. Patient ROM 105 following bike and improves to 111 follow contract relax technique in seated. Attempted lateral step downs from 7 inch step but patient unable secondary to impaired quad strength and motor control. Completed from 4 inch step with good motor control but quad fatigue noted following. Patient educated on stretching exercises at home. Patient will continue to benefit from skilled physical therapy in order to reduce impairment and improve function.    Examination-Activity Limitations Locomotion Level;Lift;Transfers;Stand;Carry;Stairs;Bend;Squat;Sleep    Examination-Participation Restrictions Community Activity;Laundry;Yard Work;Cleaning    Stability/Clinical Decision Making Stable/Uncomplicated    Rehab Potential Good    PT Frequency 3x / week    PT Duration 6 weeks    PT Treatment/Interventions ADLs/Self Care Home Management;Aquatic Therapy;Biofeedback;Cryotherapy;Electrical Stimulation;Therapeutic activities;Iontophoresis 36m/ml Dexamethasone;Moist Heat;Traction;Balance training;Manual techniques;Manual lymph drainage;Therapeutic exercise;Taping;Vasopneumatic Device;Vestibular;Splinting;Functional mobility training;Energy conservation;Orthotic  Fit/Training;Stair training;Gait training;DME Instruction;Passive range of motion;Patient/family education;Dry needling;Joint Manipulations;Spinal Manipulations;Compression bandaging;Scar mobilization;Neuromuscular re-education;Fluidtherapy;Contrast Bath;Parrafin;Ultrasound    PT Next Visit Plan Continue to progress knee AROM and functional strength    PT Home Exercise Plan 05/01/20: ankle pump, quad set, glute set, heel slide; 10/25 LAQs 05/10/20: sit to stand, heel raise    Consulted and Agree with Plan of Care Patient           Patient will benefit from skilled therapeutic intervention in order to improve the following deficits and impairments:  Abnormal gait,Increased edema,Decreased scar mobility,Decreased activity tolerance,Decreased balance,Decreased mobility,Difficulty walking,Pain,Increased fascial restricitons,Decreased strength,Decreased range of motion,Impaired flexibility,Improper body mechanics  Visit Diagnosis: Left knee pain, unspecified chronicity  Stiffness of left knee, not elsewhere classified  Other abnormalities of gait and mobility     Problem List Patient Active Problem List   Diagnosis Date Noted  . Primary osteoarthritis of right knee 04/24/2020  . Endometrial polyp 03/19/2020  . Fibroids, intramural 11/12/2019  . Hypertension   . Hypercholesteremia   . History of kidney stones   . [place]   . Colon polyps   . OA (osteoarthritis) of knee 10/14/2016     12:18 PM, 06/22/20 AMearl LatinPT, DPT Physical Therapist [place] [address]Phone: [phone]  Fax:  [phone] Name: Amy RINKSMRN: [id number] of Birth: 5Apr 16, 1952

## 2020-06-27 ENCOUNTER — Encounter (HOSPITAL_COMMUNITY): Payer: Self-pay | Admitting: Physical Therapy

## 2020-06-27 ENCOUNTER — Other Ambulatory Visit: Payer: Self-pay

## 2020-06-27 ENCOUNTER — Ambulatory Visit (HOSPITAL_COMMUNITY): Payer: Medicare HMO | Admitting: Physical Therapy

## 2020-06-27 DIAGNOSIS — R2689 Other abnormalities of gait and mobility: Secondary | ICD-10-CM | POA: Diagnosis not present

## 2020-06-27 DIAGNOSIS — N950 Postmenopausal bleeding: Secondary | ICD-10-CM

## 2020-06-27 DIAGNOSIS — M25562 Pain in left knee: Secondary | ICD-10-CM | POA: Diagnosis not present

## 2020-06-27 DIAGNOSIS — M25662 Stiffness of left knee, not elsewhere classified: Secondary | ICD-10-CM

## 2020-06-27 DIAGNOSIS — N9489 Other specified conditions associated with female genital organs and menstrual cycle: Secondary | ICD-10-CM

## 2020-06-27 DIAGNOSIS — R9389 Abnormal findings on diagnostic imaging of other specified body structures: Secondary | ICD-10-CM

## 2020-06-27 DIAGNOSIS — D251 Intramural leiomyoma of uterus: Secondary | ICD-10-CM

## 2020-06-27 NOTE — Therapy (Signed)
[place], [place], [address] Phone: [phone]   Fax:  [phone]  Physical Therapy Treatment  Patient Details  Name: Amy Reyes MRN: [id number] Date of Birth: 28-Oct-1950 Referring Provider (PT): Theresa Duty Pa-C    Encounter Date: 06/27/2020   PT End of Session - 06/27/20 1319    Visit Number 19    Number of Visits 24    Date for PT Re-Evaluation 07/07/20    Authorization Type [employer]    Authorization Time Period approved 12 vits from 11/22-12/30    Authorization - Visit Number 7    Authorization - Number of Visits 12    Progress Note Due on Visit 24    PT Start Time 1320    PT Stop Time 3151    PT Time Calculation (min) 25 min    Activity Tolerance Patient tolerated treatment well    Behavior During Therapy [facility] for tasks assessed/performed           Past Medical History:  Diagnosis Date   Allergy    seasonal allergies   Anemia    iron defiencey   Arthritis    oa   Bronchitis hx of   Chronic kidney disease    ckd stage 2- 3 due to NSAIDS use no nephrologist   Colon polyps    Complication of anesthesia    slow to wake up one time, none recent   Diabetes mellitus without complication (HCC)    borderline type 2 on metformin   GERD (gastroesophageal reflux disease)    on meds   History of carpal tunnel syndrome    years ago   History of kidney stones    History of rotator cuff tear    Bilateral   Hypercholesteremia    on meds   Hypertension    on meds   Peptic ulcer    PMB (postmenopausal bleeding)    last time middle of april 2021    Past Surgical History:  Procedure Laterality Date   ABDOMINOPLASTY  30 yrs ago   tummy tuck   BREAST SURGERY     reduction   CHOLECYSTECTOMY  2000   laparoscopic   COLONOSCOPY  2019   TA   DILATATION & CURETTAGE/HYSTEROSCOPY WITH MYOSURE N/A 12/20/2019   Procedure: DILATATION & CURETTAGE/HYSTEROSCOPY WITH  MYOSURE RESECTION OF POLYP;  Surgeon: Megan Salon, MD;  Location: [place];  Service: Gynecology;  Laterality: N/A;  resection of polyp,  to follow first case   INJECTION KNEE Left 10/14/2016   Procedure: CORTISONE LEFT KNEE INJECTION;  Surgeon: Gaynelle Arabian, MD;  Location: [facility];  Service: Orthopedics;  Laterality: Left;   LAPAROSCOPIC GASTRIC BANDING  2010 or 2011   left knee injection  6 weeks ago   POLYPECTOMY     TA   ROTATOR CUFF REPAIR Left 05/2019   TOTAL KNEE ARTHROPLASTY Right 10/14/2016   Procedure: RIGHT TOTAL KNEE ARTHROPLASTY;  Surgeon: Gaynelle Arabian, MD;  Location: [facility];  Service: Orthopedics;  Laterality: Right;  Adductor Block   TOTAL KNEE ARTHROPLASTY Left 04/24/2020   Procedure: TOTAL KNEE ARTHROPLASTY;  Surgeon: Gaynelle Arabian, MD;  Location: [facility];  Service: Orthopedics;  Laterality: Left;  69mn    There were no vitals filed for this visit.   Subjective Assessment - 06/27/20 1320    Subjective Patient states she didnt remember she had any appointment this morning. She continues to have difficulty with bringing her knee  up to put clothes on her feet. She has to leave early for prior commitment.    Pertinent History LT TKA 04/24/20    Limitations Sitting;Standing;Lifting;Walking;House hold activities    How long can you sit comfortably? no problem    How long can you stand comfortably? as long as she wants to    How long can you walk comfortably? able to walk as long as she wants she just gets tired    Patient Stated Goals do better than I was doing, get back into gym    Currently in Pain? No/denies                             [facility] Adult PT Treatment/Exercise - 06/27/20 0001      Knee/Hip Exercises: Aerobic   Stationary Bike seat 12 x 5 ' full rotation for ROM       Knee/Hip Exercises: Supine   Knee Extension AROM    Knee Extension Limitations 3    Knee Flexion AROM    Knee Flexion Limitations 106   109  following manual therapy     Manual Therapy   Manual Therapy Muscle Energy Technique    Manual therapy comments completed independently from all other aspects of treatment    Muscle Energy Technique contract relax for flexion in seated                  PT Education - 06/27/20 1318    Education Details Patient educated on HEP, exercise mechanics    Person(s) Educated Patient    Methods Explanation;Demonstration    Comprehension Verbalized understanding;Returned demonstration            PT Short Term Goals - 05/29/20 1207      PT SHORT TERM GOAL #1   Title Patient will be independent with initial HEP and self-management strategies to improve functional outcomes    Baseline Reports compliance, but with questionable return    Time 2    Period Weeks    Status Partially Met    Target Date 05/26/20      PT SHORT TERM GOAL #2   Title Patient will have LT knee AROM 5-90 degrees to improve functional mobility and facilitate squatting to pick up items from floor.    Baseline Current 5-102 degrees    Time 3    Period Weeks    Status Achieved    Target Date 05/26/20             PT Long Term Goals - 05/29/20 1208      PT LONG TERM GOAL #1   Title Patient will have LT knee AROM 0-120 degrees to improve functional mobility and facilitate squatting to pick up items from floor.    Baseline Current 5-102 degrees    Time 6    Period Weeks    Status On-going      PT LONG TERM GOAL #2   Title Patient will be able to perform stand x 5 in < 15 seconds to demonstrate improvement in functional mobility and reduced risk for falls.    Baseline Current 10.15 seconds    Time 6    Period Weeks    Status Achieved      PT LONG TERM GOAL #3   Title Patient will improve FOTO score by 20% to indicate improvement in functional outcomes    Baseline Improved by 32%    Time 6  Period Weeks    Status Achieved      PT LONG TERM GOAL #4   Title Patient will be able to ambulate at  least 300 feet during 2MWT with LRAD to demonstrate improved ability to perform functional mobility and associated tasks.    Baseline Current 565 feet with no AD    Time 6    Period Weeks    Status Achieved                 Plan - 06/27/20 1319    Clinical Impression Statement Patient requested to leave appointment early due to prior commitment. Patient begins with rocking on bike to improve knee flexion ROM and patient able to progress to full revolutions after about 2 minutes. Discussed POC while patient was on bike and educated her on progress made and ability to transition to self management with HEP. Educated patient on transition to HEP, riding stationary bike and using treadmill at gym, and importance of strength training. Patient ROM improves to 109 today following manual therapy. Patient will continue to benefit from skilled physical therapy in order to reduce impairment and improve function.    Examination-Activity Limitations Locomotion Level;Lift;Transfers;Stand;Carry;Stairs;Bend;Squat;Sleep    Examination-Participation Restrictions Community Activity;Laundry;Yard Work;Cleaning    Stability/Clinical Decision Making Stable/Uncomplicated    Rehab Potential Good    PT Frequency 3x / week    PT Duration 6 weeks    PT Treatment/Interventions ADLs/Self Care Home Management;Aquatic Therapy;Biofeedback;Cryotherapy;Electrical Stimulation;Therapeutic activities;Iontophoresis 18m/ml Dexamethasone;Moist Heat;Traction;Balance training;Manual techniques;Manual lymph drainage;Therapeutic exercise;Taping;Vasopneumatic Device;Vestibular;Splinting;Functional mobility training;Energy conservation;Orthotic Fit/Training;Stair training;Gait training;DME Instruction;Passive range of motion;Patient/family education;Dry needling;Joint Manipulations;Spinal Manipulations;Compression bandaging;Scar mobilization;Neuromuscular re-education;Fluidtherapy;Contrast Bath;Parrafin;Ultrasound    PT Next Visit Plan  Continue to progress knee AROM and functional strength; possibly d/c    PT Home Exercise Plan 05/01/20: ankle pump, quad set, glute set, heel slide; 10/25 LAQs 05/10/20: sit to stand, heel raise    Consulted and Agree with Plan of Care Patient           Patient will benefit from skilled therapeutic intervention in order to improve the following deficits and impairments:  Abnormal gait,Increased edema,Decreased scar mobility,Decreased activity tolerance,Decreased balance,Decreased mobility,Difficulty walking,Pain,Increased fascial restricitons,Decreased strength,Decreased range of motion,Impaired flexibility,Improper body mechanics  Visit Diagnosis: Left knee pain, unspecified chronicity  Stiffness of left knee, not elsewhere classified  Other abnormalities of gait and mobility     Problem List Patient Active Problem List   Diagnosis Date Noted   Primary osteoarthritis of right knee 04/24/2020   Endometrial polyp 03/19/2020   Fibroids, intramural 11/12/2019   Hypertension    Hypercholesteremia    History of kidney stones    Diabetes mellitus without complication (HCC)    Colon polyps    OA (osteoarthritis) of knee 10/14/2016    1:50 PM, 06/27/20 AMearl LatinPT, DPT [place] Therap[place] [address]Phone: [phone]  Fax:  [phone] Name: Amy LUFTMRN: [id number] of Birth: 507-29-1952

## 2020-06-27 NOTE — Progress Notes (Signed)
PUS orders placed per Dr Talbert Nan.  Encounter closed

## 2020-06-29 ENCOUNTER — Other Ambulatory Visit: Payer: Medicare HMO

## 2020-06-29 ENCOUNTER — Other Ambulatory Visit: Payer: Medicare HMO | Admitting: Obstetrics and Gynecology

## 2020-06-30 ENCOUNTER — Ambulatory Visit (HOSPITAL_COMMUNITY): Payer: Medicare HMO | Admitting: Physical Therapy

## 2020-06-30 ENCOUNTER — Encounter (HOSPITAL_COMMUNITY): Payer: Self-pay | Admitting: Physical Therapy

## 2020-06-30 ENCOUNTER — Other Ambulatory Visit: Payer: Self-pay

## 2020-06-30 DIAGNOSIS — M25662 Stiffness of left knee, not elsewhere classified: Secondary | ICD-10-CM

## 2020-06-30 DIAGNOSIS — M25562 Pain in left knee: Secondary | ICD-10-CM

## 2020-06-30 DIAGNOSIS — R2689 Other abnormalities of gait and mobility: Secondary | ICD-10-CM

## 2020-06-30 NOTE — Therapy (Signed)
[facility] [address] [place], [place], [address] Phone: [phone]   Fax:  [phone]  Physical Therapy Treatment and Discharge Summary  Patient Details  Name: Amy Reyes MRN: [id number] Date of Birth: 08-19-1950 Referring Provider (PT): Theresa Duty Pa-C     PHYSICAL THERAPY DISCHARGE SUMMARY  Visits from Start of Care: 20  Current functional level related to goals / functional outcomes: See below   Remaining deficits: No functional limitations. Continues to exhibit ROM deficits   Education / Equipment: Patient instructed and provided with written/visual materials for HEP Plan: Patient agrees to discharge.  Patient goals were partially met. Patient is being discharged due to meeting the stated rehab goals.  ?????        Encounter Date: 06/30/2020   PT End of Session - 06/30/20 1136    Visit Number 20    Number of Visits 24    Date for PT Re-Evaluation 07/07/20    Authorization Type [employer]    Authorization Time Period approved 12 vits from 11/22-12/30    Authorization - Visit Number 8    Authorization - Number of Visits 12    Progress Note Due on Visit 24    PT Start Time 3710    PT Stop Time 1210    PT Time Calculation (min) 34 min    Activity Tolerance Patient tolerated treatment well    Behavior During Therapy WFL for tasks assessed/performed           Past Medical History:  Diagnosis Date  . Allergy    seasonal allergies  . Anemia    iron defiencey  . Arthritis    oa  . Bronchitis hx of  . Chronic kidney disease    ckd stage 2- 3 due to NSAIDS use no nephrologist  . Colon polyps   . Complication of anesthesia    slow to wake up one time, none recent  . Diabetes mellitus without complication ([place])    borderline type 2 on metformin  . GERD (gastroesophageal reflux disease)    on meds  . History of carpal tunnel syndrome    years ago  . History of kidney stones   .  History of rotator cuff tear    Bilateral  . Hypercholesteremia    on meds  . Hypertension    on meds  . Peptic ulcer   . PMB (postmenopausal bleeding)    last time middle of april 2021    Past Surgical History:  Procedure Laterality Date  . ABDOMINOPLASTY  30 yrs ago   tummy tuck  . BREAST SURGERY     reduction  . CHOLECYSTECTOMY  2000   laparoscopic  . COLONOSCOPY  2019   TA  . DILATATION & CURETTAGE/HYSTEROSCOPY WITH MYOSURE N/A 12/20/2019   Procedure: DILATATION & CURETTAGE/HYSTEROSCOPY WITH MYOSURE RESECTION OF POLYP;  Surgeon: Megan Salon, MD;  Location: [place];  Service: Gynecology;  Laterality: N/A;  resection of polyp,  to follow first case  . INJECTION KNEE Left 10/14/2016   Procedure: CORTISONE LEFT KNEE INJECTION;  Surgeon: Gaynelle Arabian, MD;  Location: [facility];  Service: Orthopedics;  Laterality: Left;  . LAPAROSCOPIC GASTRIC BANDING  2010 or 2011  . left knee injection  6 weeks ago  . POLYPECTOMY     TA  . ROTATOR CUFF REPAIR Left 05/2019  . TOTAL KNEE ARTHROPLASTY Right 10/14/2016   Procedure: RIGHT TOTAL KNEE ARTHROPLASTY;  Surgeon: Gaynelle Arabian, MD;  Location: [facility];  Service: Orthopedics;  Laterality: Right;  Adductor Block  . TOTAL KNEE ARTHROPLASTY Left 04/24/2020   Procedure: TOTAL KNEE ARTHROPLASTY;  Surgeon: Gaynelle Arabian, MD;  Location: [facility];  Service: Orthopedics;  Laterality: Left;  77mn    There were no vitals filed for this visit.   Subjective Assessment - 06/30/20 1140    Subjective Patient states that she is doing as well as can be expected and that today's session will be the last for formal therapy and that she would like to continue on her own via HEP and that she will be going to fitness facility for continuing her own activities    Pertinent History LT TKA 04/24/20    Limitations Sitting;Standing;Lifting;Walking;House hold activities    How long can you sit comfortably? no problem    How long can you stand  comfortably? as long as she wants to    How long can you walk comfortably? able to walk as long as she wants she just gets tired    Patient Stated Goals do better than I was doing, get back into gym    Currently in Pain? No/denies    Pain Score 0-No pain    Pain Orientation Left    Pain Onset In the past 7 days              [facility] - 06/30/20 0001      Assessment   Medical Diagnosis LT TKA     Referring Provider (PT) KTheresa DutyPa-C     Onset Date/Surgical Date 04/24/20      Observation/Other Assessments   Focus on Therapeutic Outcomes (FOTO)  67% function                         [facility] Adult PT Treatment/Exercise - 06/30/20 0001      Knee/Hip Exercises: Aerobic   Stationary Bike seat 12 x 5 ' full rotation for ROM       Knee/Hip Exercises: Standing   Lateral Step Up Left;2 sets;Step Height: 4";15 reps;20 reps    Lateral Step Up Limitations eccentric control    Forward Step Up Left;1 set;Hand Hold: 1;Step Height: 6";20 reps    Step Down Left;Hand Hold: 1;20 reps;Step Height: 4";2 sets    Step Down Limitations eccentric control      Knee/Hip Exercises: Supine   Knee Extension AROM    Knee Extension Limitations 3    Knee Flexion AROM    Knee Flexion Limitations 112    Other Supine Knee/Hip Exercises knee flexion "bounce" on stablity ball x 2 min      Knee/Hip Exercises: Prone   Hamstring Curl Other (comment)   2 min with stability ball on posterior for target                 PT Education - 06/30/20 1151    Education Details Patient educated on continued HEP and emphasis on eccentric control for closed chain maneuvers    Person(s) Educated Patient    Methods Explanation    Comprehension Verbalized understanding;Returned demonstration            PT Short Term Goals - 06/30/20 1212      PT SHORT TERM GOAL #1   Title Patient will be independent with initial HEP and self-management strategies to improve functional outcomes     Baseline Patient demo independence with exercise recall but reports limited home participation    Time 2  Period Weeks    Status Achieved    Target Date 05/26/20      PT SHORT TERM GOAL #2   Title Patient will have LT knee AROM 5-90 degrees to improve functional mobility and facilitate squatting to pick up items from floor.    Baseline Current 5-102 degrees    Time 3    Period Weeks    Status Achieved    Target Date 05/26/20             PT Long Term Goals - 06/30/20 1213      PT LONG TERM GOAL #1   Title Patient will have LT knee AROM 0-120 degrees to improve functional mobility and facilitate squatting to pick up items from floor.    Baseline Current lack of 3 degrees extension and 112 degrees flexion    Time 6    Period Weeks    Status Not Met      PT LONG TERM GOAL #2   Title Patient will be able to perform stand x 5 in < 15 seconds to demonstrate improvement in functional mobility and reduced risk for falls.    Baseline Current 10.15 seconds    Time 6    Period Weeks    Status Achieved      PT LONG TERM GOAL #3   Title Patient will improve FOTO score by 20% to indicate improvement in functional outcomes    Baseline Improved to 67% function    Time 6    Period Weeks    Status Achieved      PT LONG TERM GOAL #4   Title Patient will be able to ambulate at least 300 feet during 2MWT with LRAD to demonstrate improved ability to perform functional mobility and associated tasks.    Baseline Current 565 feet with no AD    Time 6    Period Weeks    Status Achieved                 Plan - 06/30/20 1151    Clinical Impression Statement Patient presents to therapy session and reports she is doing well overall and that she would like to make today's session her last for formal therapy and that she is inclined to continue on her own via HEP.  Patient demonstrates proficiency with HEP activities and demonstrates overall improvement since start of care. D/C to HEP  and will follow-up with ortho MD 07/04/20. Patient able to meet 2/2 STG and 3/4 LTG    Examination-Activity Limitations Locomotion Level;Lift;Transfers;Stand;Carry;Stairs;Bend;Squat;Sleep    Examination-Participation Restrictions Community Activity;Laundry;Yard Work;Cleaning    Stability/Clinical Decision Making Stable/Uncomplicated    Rehab Potential Good    PT Frequency 3x / week    PT Duration 6 weeks    PT Treatment/Interventions ADLs/Self Care Home Management;Aquatic Therapy;Biofeedback;Cryotherapy;Electrical Stimulation;Therapeutic activities;Iontophoresis 57m/ml Dexamethasone;Moist Heat;Traction;Balance training;Manual techniques;Manual lymph drainage;Therapeutic exercise;Taping;Vasopneumatic Device;Vestibular;Splinting;Functional mobility training;Energy conservation;Orthotic Fit/Training;Stair training;Gait training;DME Instruction;Passive range of motion;Patient/family education;Dry needling;Joint Manipulations;Spinal Manipulations;Compression bandaging;Scar mobilization;Neuromuscular re-education;Fluidtherapy;Contrast Bath;Parrafin;Ultrasound    PT Next Visit Plan Continue to progress knee AROM and functional strength; possibly d/c    PT Home Exercise Plan 05/01/20: ankle pump, quad set, glute set, heel slide; 10/25 LAQs 05/10/20: sit to stand, heel raise; 12/17 step ups, lateral steps, prone knee flexion AROM    Consulted and Agree with Plan of Care Patient           Patient will benefit from skilled therapeutic intervention in order to improve the following deficits and impairments:  Abnormal gait,Increased  edema,Decreased scar mobility,Decreased activity tolerance,Decreased balance,Decreased mobility,Difficulty walking,Pain,Increased fascial restricitons,Decreased strength,Decreased range of motion,Impaired flexibility,Improper body mechanics  Visit Diagnosis: Left knee pain, unspecified chronicity  Stiffness of left knee, not elsewhere classified  Other abnormalities of gait and  mobility     Problem List Patient Active Problem List   Diagnosis Date Noted  . Primary osteoarthritis of right knee 04/24/2020  . Endometrial polyp 03/19/2020  . Fibroids, intramural 11/12/2019  . Hypertension   . Hypercholesteremia   . History of kidney stones   . Diabetes mellitus without complication ([place])   . Colon polyps   . OA (osteoarthritis) of knee 10/14/2016     12:19 PM, 06/30/20 M. Sherlyn Lees, PT, DPT Physical Therapist- [place] Office Number: [phone]  [facility] [address] [place], [place], [address] Phone: [phone]   Fax:  [phone]  Name: Amy Reyes MRN: [id number] Date of Birth: Apr 15, 1951

## 2020-07-03 ENCOUNTER — Ambulatory Visit (HOSPITAL_COMMUNITY): Payer: Medicare HMO | Admitting: Physical Therapy

## 2020-07-05 ENCOUNTER — Encounter (HOSPITAL_COMMUNITY): Payer: Medicare HMO

## 2020-07-11 ENCOUNTER — Encounter (HOSPITAL_COMMUNITY): Payer: Medicare HMO | Admitting: Physical Therapy

## 2020-07-13 ENCOUNTER — Encounter (HOSPITAL_COMMUNITY): Payer: Medicare HMO | Admitting: Physical Therapy

## 2020-07-19 ENCOUNTER — Telehealth: Payer: Self-pay | Admitting: Obstetrics and Gynecology

## 2020-07-19 NOTE — Telephone Encounter (Signed)
Patient canceled her PUS tomorrow due to a "torn rotator cuff'. She will call later to reschedule.

## 2020-07-20 ENCOUNTER — Other Ambulatory Visit: Payer: Medicare HMO | Admitting: Obstetrics and Gynecology

## 2020-07-20 ENCOUNTER — Other Ambulatory Visit: Payer: Medicare HMO

## 2020-07-21 ENCOUNTER — Other Ambulatory Visit: Payer: Self-pay | Admitting: Gastroenterology

## 2020-07-25 DIAGNOSIS — M25511 Pain in right shoulder: Secondary | ICD-10-CM | POA: Diagnosis not present

## 2020-07-25 DIAGNOSIS — M75121 Complete rotator cuff tear or rupture of right shoulder, not specified as traumatic: Secondary | ICD-10-CM | POA: Diagnosis not present

## 2020-08-17 DIAGNOSIS — M25511 Pain in right shoulder: Secondary | ICD-10-CM | POA: Diagnosis not present

## 2020-08-18 DIAGNOSIS — M1990 Unspecified osteoarthritis, unspecified site: Secondary | ICD-10-CM | POA: Diagnosis not present

## 2020-08-18 DIAGNOSIS — Z79890 Hormone replacement therapy (postmenopausal): Secondary | ICD-10-CM | POA: Diagnosis not present

## 2020-08-22 DIAGNOSIS — R809 Proteinuria, unspecified: Secondary | ICD-10-CM | POA: Diagnosis not present

## 2020-08-22 DIAGNOSIS — E1129 Type 2 diabetes mellitus with other diabetic kidney complication: Secondary | ICD-10-CM | POA: Diagnosis not present

## 2020-08-22 DIAGNOSIS — Z9884 Bariatric surgery status: Secondary | ICD-10-CM | POA: Diagnosis not present

## 2020-08-22 DIAGNOSIS — N1832 Chronic kidney disease, stage 3b: Secondary | ICD-10-CM | POA: Diagnosis not present

## 2020-08-22 DIAGNOSIS — D631 Anemia in chronic kidney disease: Secondary | ICD-10-CM | POA: Diagnosis not present

## 2020-08-22 DIAGNOSIS — E7800 Pure hypercholesterolemia, unspecified: Secondary | ICD-10-CM | POA: Diagnosis not present

## 2020-08-22 DIAGNOSIS — I129 Hypertensive chronic kidney disease with stage 1 through stage 4 chronic kidney disease, or unspecified chronic kidney disease: Secondary | ICD-10-CM | POA: Diagnosis not present

## 2020-08-22 DIAGNOSIS — Z96651 Presence of right artificial knee joint: Secondary | ICD-10-CM | POA: Diagnosis not present

## 2020-09-11 DIAGNOSIS — D631 Anemia in chronic kidney disease: Secondary | ICD-10-CM | POA: Diagnosis not present

## 2020-09-11 DIAGNOSIS — E1122 Type 2 diabetes mellitus with diabetic chronic kidney disease: Secondary | ICD-10-CM | POA: Diagnosis not present

## 2020-09-11 DIAGNOSIS — F321 Major depressive disorder, single episode, moderate: Secondary | ICD-10-CM | POA: Diagnosis not present

## 2020-09-11 DIAGNOSIS — E7800 Pure hypercholesterolemia, unspecified: Secondary | ICD-10-CM | POA: Diagnosis not present

## 2020-09-11 DIAGNOSIS — E1129 Type 2 diabetes mellitus with other diabetic kidney complication: Secondary | ICD-10-CM | POA: Diagnosis not present

## 2020-09-11 DIAGNOSIS — N1832 Chronic kidney disease, stage 3b: Secondary | ICD-10-CM | POA: Diagnosis not present

## 2020-09-11 DIAGNOSIS — M1990 Unspecified osteoarthritis, unspecified site: Secondary | ICD-10-CM | POA: Diagnosis not present

## 2020-09-11 DIAGNOSIS — I129 Hypertensive chronic kidney disease with stage 1 through stage 4 chronic kidney disease, or unspecified chronic kidney disease: Secondary | ICD-10-CM | POA: Diagnosis not present

## 2020-10-12 DIAGNOSIS — I129 Hypertensive chronic kidney disease with stage 1 through stage 4 chronic kidney disease, or unspecified chronic kidney disease: Secondary | ICD-10-CM | POA: Diagnosis not present

## 2020-10-12 DIAGNOSIS — E1129 Type 2 diabetes mellitus with other diabetic kidney complication: Secondary | ICD-10-CM | POA: Diagnosis not present

## 2020-10-12 DIAGNOSIS — E7800 Pure hypercholesterolemia, unspecified: Secondary | ICD-10-CM | POA: Diagnosis not present

## 2020-10-13 ENCOUNTER — Encounter (INDEPENDENT_AMBULATORY_CARE_PROVIDER_SITE_OTHER): Payer: Self-pay | Admitting: *Deleted

## 2020-11-09 NOTE — H&P (Signed)
Patient's anticipated LOS is less than 2 midnights, meeting these requirements: - Younger than 68 - Lives within 1 hour of care - Has a competent adult at home to recover with post-op recover - NO history of  - Chronic pain requiring opiods  - Diabetes  - Coronary Artery Disease  - Heart failure  - Heart attack  - Stroke  - DVT/VTE  - Cardiac arrhythmia  - Respiratory Failure/COPD  - Renal failure  - Anemia  - Advanced Liver disease       Amy Reyes is an 70 y.o. female.    Chief Complaint: right shoulder pain  HPI: Pt is a 70 y.o. female complaining of right shoulder pain for multiple years. Pain had continually increased since the beginning. X-rays in the clinic show end-stage arthritic changes of the right shoulder. Pt has tried various conservative treatments which have failed to alleviate their symptoms, including injections and therapy. Various options are discussed with the patient. Risks, benefits and expectations were discussed with the patient. Patient understand the risks, benefits and expectations and wishes to proceed with surgery.   PCP:  Haywood Pao, MD  D/C Plans: Home  PMH: Past Medical History:  Diagnosis Date  . Allergy    seasonal allergies  . Anemia    iron defiencey  . Arthritis    oa  . Bronchitis hx of  . Chronic kidney disease    ckd stage 2- 3 due to NSAIDS use no nephrologist  . Colon polyps   . Complication of anesthesia    slow to wake up one time, none recent  . Diabetes mellitus without complication ([place])    borderline type 2 on metformin  . GERD (gastroesophageal reflux disease)    on meds  . History of carpal tunnel syndrome    years ago  . History of kidney stones   . History of rotator cuff tear    Bilateral  . Hypercholesteremia    on meds  . Hypertension    on meds  . Peptic ulcer   . PMB (postmenopausal bleeding)    last time middle of april 2021    PSH: Past Surgical History:  Procedure  Laterality Date  . ABDOMINOPLASTY  30 yrs ago   tummy tuck  . BREAST SURGERY     reduction  . CHOLECYSTECTOMY  2000   laparoscopic  . COLONOSCOPY  2019   TA  . DILATATION & CURETTAGE/HYSTEROSCOPY WITH MYOSURE N/A 12/20/2019   Procedure: DILATATION & CURETTAGE/HYSTEROSCOPY WITH MYOSURE RESECTION OF POLYP;  Surgeon: Megan Salon, MD;  Location: [facility];  Service: Gynecology;  Laterality: N/A;  resection of polyp,  to follow first case  . INJECTION KNEE Left 10/14/2016   Procedure: CORTISONE LEFT KNEE INJECTION;  Surgeon: Gaynelle Arabian, MD;  Location: [facility];  Service: Orthopedics;  Laterality: Left;  . LAPAROSCOPIC GASTRIC BANDING  2010 or 2011  . left knee injection  6 weeks ago  . POLYPECTOMY     TA  . ROTATOR CUFF REPAIR Left 05/2019  . TOTAL KNEE ARTHROPLASTY Right 10/14/2016   Procedure: RIGHT TOTAL KNEE ARTHROPLASTY;  Surgeon: Gaynelle Arabian, MD;  Location[facility];  Service: Orthopedics;  Laterality: Right;  Adductor Block  . TOTAL KNEE ARTHROPLASTY Left 04/24/2020   Procedure: TOTAL KNEE ARTHROPLASTY;  Surgeon: Gaynelle Arabian, MD;  Location: [facility];  Service: Orthopedics;  Laterality: Left;  27min    Social History:  reports that she quit smoking about 37 years ago.  Her smoking use included cigarettes. She quit after 1.00 year of use. She has never used smokeless tobacco. She reports current alcohol use of about 2.0 standard drinks of alcohol per week. She reports that she does not use drugs.  Allergies:  Allergies  Allergen Reactions  . Tramadol Itching  . Codeine     Does not like the side effects-post taking    Medications: No current facility-administered medications for this encounter.   Current Outpatient Medications  Medication Sig Dispense Refill  . atorvastatin (LIPITOR) 80 MG tablet Take 80 mg by mouth daily.     . cyclobenzaprine (FLEXERIL) 10 MG tablet Take 1 tablet (10 mg total) by mouth 3 (three) times daily as needed for muscle spasms. 40  tablet 0  . ferrous sulfate 325 (65 FE) MG tablet Take 325 mg by mouth daily with breakfast.    . losartan-hydrochlorothiazide (HYZAAR) 100-25 MG tablet Take 1 tablet by mouth daily.    . metFORMIN (GLUCOPHAGE) 500 MG tablet Take 500 mg by mouth daily.    Marland Kitchen omeprazole (PRILOSEC) 40 MG capsule Take 1 capsule (40 mg total) by mouth daily. 90 capsule 3  . rivaroxaban (XARELTO) 10 MG TABS tablet Take 1 tablet (10 mg total) by mouth daily with breakfast for 20 days. Then take one 81 mg aspirin once a day for three weeks. Then discontinue aspirin. 20 tablet 0    No results found for this or any previous visit (from the past 48 hour(s)). No results found.  ROS: Pain with rom of the right upper extremity  Physical Exam: Alert and oriented 70 y.o. female in no acute distress Cranial nerves 2-12 intact Cervical spine: full rom with no tenderness, nv intact distally Chest: active breath sounds bilaterally, no wheeze rhonchi or rales Heart: regular rate and rhythm, no murmur Abd: non tender non distended with active bowel sounds Hip is stable with rom  Right shoulder painful and weak rom nv intact distally No rashes or edema distally   Assessment/Plan Assessment: right shoulder cuff arthropathy  Plan:  Patient will undergo a right reverse total shoulder by Dr. Veverly Fells at [facility] Risks benefits and expectations were discussed with the patient. Patient understand risks, benefits and expectations and wishes to proceed. Preoperative templating of the joint replacement has been completed, documented, and submitted to the Operating Room personnel in order to optimize intra-operative equipment management.   Merla Riches PA-C, MPAS [facility] is now [employer] [facility] [address] [place], [place], [place] [address] Phone: [phone] [email or web address] Facebook  [employer]

## 2020-11-21 NOTE — Progress Notes (Signed)
DUE TO COVID-19 ONLY ONE VISITOR IS ALLOWED TO COME WITH YOU AND STAY IN THE WAITING ROOM ONLY DURING PRE OP AND PROCEDURE DAY OF SURGERY. THE 1 VISITOR  MAY VISIT WITH YOU AFTER SURGERY IN YOUR PRIVATE ROOM DURING VISITING HOURS ONLY!  YOU NEED TO HAVE A COVID 19 TEST ON_______ @_______ , THIS TEST MUST BE DONE BEFORE SURGERY,  COVID TESTING SITE [address] [place] [address], IT IS ON THE RIGHT GOING OUT [address] APPROXIMATELY  2 MINUTES PAST [employer] ON THE RIGHT. ONCE YOUR COVID TEST IS COMPLETED,  PLEASE BEGIN THE QUARANTINE INSTRUCTIONS AS OUTLINED IN YOUR HANDOUT.                FARDOWSA AUTHIER  11/21/2020   Your procedure is scheduled on:  12/01/2020   Report to [facility] Main  Entrance   Report to admitting at   [facility] AM     Call this number if you have problems the morning of surgery [phone]    REMEMBER: NO  SOLID FOOD CANDY OR GUM AFTER MIDNIGHT. CLEAR LIQUIDS UNTIL    [id number]       . NOTHING BY MOUTH EXCEPT CLEAR LIQUIDS UNTIL     0430AM   . PLEASE FINISH ENSURE DRINK PER SURGEON ORDER  WHICH NEEDS TO BE COMPLETED AT   0430AM    .      CLEAR LIQUID DIET   Foods Allowed                                                                    Coffee and tea, regular and decaf                            Fruit ices (not with fruit pulp)                                      Iced Popsicles                                    Carbonated beverages, regular and diet                                    Cranberry, grape and apple juices Sports drinks like Gatorade Lightly seasoned clear broth or consume(fat free) Sugar, honey syrup ___________________________________________________________________      BRUSH YOUR TEETH MORNING OF SURGERY AND RINSE YOUR MOUTH OUT, NO CHEWING GUM CANDY OR MINTS.     Take these medicines the morning of surgery with A SIP OF WATER:                   pRILOSEC  DO NOT TAKE ANY DIABETIC MEDICATIONS DAY OF  YOUR SURGERY                               You may not have any metal on your body including  hair pins and              piercings  Do not wear jewelry, make-up, lotions, powders or perfumes, deodorant             Do not wear nail polish on your fingernails.  Do not shave  48 hours prior to surgery.              Men may shave face and neck.   Do not bring valuables to the hospital. [place].  Contacts, dentures or bridgework may not be worn into surgery.  Leave suitcase in the car. After surgery it may be brought to your room.     Patients discharged the day of surgery will not be allowed to drive home. IF YOU ARE HAVING SURGERY AND GOING HOME THE SAME DAY, YOU MUST HAVE AN ADULT TO DRIVE YOU HOME AND BE WITH YOU FOR 24 HOURS. YOU MAY GO HOME BY TAXI OR UBER OR ORTHERWISE, BUT AN ADULT MUST ACCOMPANY YOU HOME AND STAY WITH YOU FOR 24 HOURS.  Name and phone number of your driver:  Special Instructions: N/A              Please read over the following fact sheets you were given: _____________________________________________________________________  [facility] - Preparing for Surgery Before surgery, you can play an important role.  Because skin is not sterile, your skin needs to be as free of germs as possible.  You can reduce the number of germs on your skin by washing with CHG (chlorahexidine gluconate) soap before surgery.  CHG is an antiseptic cleaner which kills germs and bonds with the skin to continue killing germs even after washing. Please DO NOT use if you have an allergy to CHG or antibacterial soaps.  If your skin becomes reddened/irritated stop using the CHG and inform your nurse when you arrive at Short Stay. Do not shave (including legs and underarms) for at least 48 hours prior to the first CHG shower.  You may shave your face/neck. Please follow these instructions carefully:  1.  Shower with CHG Soap the night before surgery and  the  morning of Surgery.  2.  If you choose to wash your hair, wash your hair first as usual with your  normal  shampoo.  3.  After you shampoo, rinse your hair and body thoroughly to remove the  shampoo.                           4.  Use CHG as you would any other liquid soap.  You can apply chg directly  to the skin and wash                       Gently with a scrungie or clean washcloth.  5.  Apply the CHG Soap to your body ONLY FROM THE NECK DOWN.   Do not use on face/ open                           Wound or open sores. Avoid contact with eyes, ears mouth and genitals (private parts).                       Wash face,  Genitals (private parts) with  your normal soap.             6.  Wash thoroughly, paying special attention to the area where your surgery  will be performed.  7.  Thoroughly rinse your body with warm water from the neck down.  8.  DO NOT shower/wash with your normal soap after using and rinsing off  the CHG Soap.                9.  Pat yourself dry with a clean towel.            10.  Wear clean pajamas.            11.  Place clean sheets on your bed the night of your first shower and do not  sleep with pets. Day of Surgery : Do not apply any lotions/deodorants the morning of surgery.  Please wear clean clothes to the hospital/surgery center.  FAILURE TO FOLLOW THESE INSTRUCTIONS MAY RESULT IN THE CANCELLATION OF YOUR SURGERY PATIENT SIGNATURE_________________________________  NURSE SIGNATURE__________________________________  ________________________________________________________________________             [facility]- Preparing for Total Shoulder Arthroplasty    Before surgery, you can play an important role. Because skin is not sterile, your skin needs to be as free of germs as possible. You can reduce the number of germs on your skin by using the following products. . Benzoyl Peroxide Gel o Reduces the number of germs present on the skin o Applied twice a day to  shoulder area starting two days before surgery    ==================================================================  Please follow these instructions carefully:  BENZOYL PEROXIDE 5% GEL  Please do not use if you have an allergy to benzoyl peroxide.   If your skin becomes reddened/irritated stop using the benzoyl peroxide.  Starting two days before surgery, apply as follows: 1. Apply benzoyl peroxide in the morning and at night. Apply after taking a shower. If you are not taking a shower clean entire shoulder front, back, and side along with the armpit with a clean wet washcloth.  2. Place a quarter-sized dollop on your shoulder and rub in thoroughly, making sure to cover the front, back, and side of your shoulder, along with the armpit.   2 days before ____ AM   ____ PM              1 day before ____ AM   ____ PM                         3. Do this twice a day for two days.  (Last application is the night before surgery, AFTER using the CHG soap as described below).  4. Do NOT apply benzoyl peroxide gel on the day of surgery.

## 2020-11-23 ENCOUNTER — Encounter (HOSPITAL_COMMUNITY)
Admission: RE | Admit: 2020-11-23 | Discharge: 2020-11-23 | Disposition: A | Discharge status: Home / Self Care | Payer: Medicare HMO | Source: Ambulatory Visit | Attending: Orthopedic Surgery | Admitting: Orthopedic Surgery

## 2020-11-27 ENCOUNTER — Other Ambulatory Visit (INDEPENDENT_AMBULATORY_CARE_PROVIDER_SITE_OTHER): Payer: Self-pay

## 2020-11-27 ENCOUNTER — Telehealth (INDEPENDENT_AMBULATORY_CARE_PROVIDER_SITE_OTHER): Payer: Self-pay

## 2020-11-27 ENCOUNTER — Encounter (INDEPENDENT_AMBULATORY_CARE_PROVIDER_SITE_OTHER): Payer: Self-pay

## 2020-11-27 DIAGNOSIS — E7800 Pure hypercholesterolemia, unspecified: Secondary | ICD-10-CM | POA: Diagnosis not present

## 2020-11-27 DIAGNOSIS — Z800 Family history of malignant neoplasm of digestive organs: Secondary | ICD-10-CM

## 2020-11-27 DIAGNOSIS — F321 Major depressive disorder, single episode, moderate: Secondary | ICD-10-CM | POA: Diagnosis not present

## 2020-11-27 DIAGNOSIS — N1832 Chronic kidney disease, stage 3b: Secondary | ICD-10-CM | POA: Diagnosis not present

## 2020-11-27 DIAGNOSIS — D631 Anemia in chronic kidney disease: Secondary | ICD-10-CM | POA: Diagnosis not present

## 2020-11-27 DIAGNOSIS — R809 Proteinuria, unspecified: Secondary | ICD-10-CM | POA: Diagnosis not present

## 2020-11-27 DIAGNOSIS — Z9884 Bariatric surgery status: Secondary | ICD-10-CM | POA: Diagnosis not present

## 2020-11-27 DIAGNOSIS — Z86010 Personal history of colonic polyps: Secondary | ICD-10-CM

## 2020-11-27 DIAGNOSIS — E1129 Type 2 diabetes mellitus with other diabetic kidney complication: Secondary | ICD-10-CM | POA: Diagnosis not present

## 2020-11-27 DIAGNOSIS — I129 Hypertensive chronic kidney disease with stage 1 through stage 4 chronic kidney disease, or unspecified chronic kidney disease: Secondary | ICD-10-CM | POA: Diagnosis not present

## 2020-11-27 MED ORDER — PEG 3350-KCL-NA BICARB-NACL 420 G PO SOLR: 4000.0000 mL | ORAL | 0 refills | Status: DC

## 2020-11-27 NOTE — Telephone Encounter (Signed)
Referring MD/PCP: TISOVEC  Procedure: TCS  Reason/Indication:  Hx of colonic polyps, fam hx of colon ca  Has patient had this procedure before?  yes  If so, when, by whom and where?  2021  Is there a family history of colon cancer?  yes  Who?  What age when diagnosed? father   Is patient diabetic? If yes, Type 1 or Type 2   Yes boderline type 2      Does patient have prosthetic heart valve or mechanical valve?  no  Do you have a pacemaker/defibrillator?  no  Has patient ever had endocarditis/atrial fibrillation? no  Have you had a stroke/heart attack last 6 mths? no  Does patient use oxygen? no  Has patient had joint replacement within last 12 months?  Yes, knee 10/21  Is patient constipated or do they take laxatives? no  Does patient have a history of alcohol/drug use?  no  Is patient on blood thinner such as Coumadin, Plavix and/or Aspirin? no  Do you take medicine for weight loss?  no  For female patients,: do you still have your menstrual cycle? no  Medications: Atorvastatin 80 mg daily, metformin 500mg  daily, Losartan 100/25 mg daily, Iron 325 mg daily, Vit D3 daily, Vit C daily, Mvi Daily  Allergies: Tamadol, Codeine  Medication Adjustment per Dr Rehman/Dr Jenetta Downer Hold Metformin the evening prior and morning of procedure  Procedure date & time: 12/13/20 11:00

## 2020-11-27 NOTE — Telephone Encounter (Signed)
LeighAnn Anny Sayler, CMA  

## 2020-11-27 NOTE — Telephone Encounter (Signed)
Ok to schedule.

## 2020-12-01 ENCOUNTER — Ambulatory Visit: Admit: 2020-12-01 | Payer: Medicare HMO | Admitting: Orthopedic Surgery

## 2020-12-01 SURGERY — ARTHROPLASTY, SHOULDER, TOTAL, REVERSE
Anesthesia: General | Site: Shoulder | Laterality: Right

## 2020-12-12 ENCOUNTER — Encounter (INDEPENDENT_AMBULATORY_CARE_PROVIDER_SITE_OTHER): Payer: Self-pay

## 2020-12-12 ENCOUNTER — Other Ambulatory Visit: Payer: Self-pay

## 2020-12-12 ENCOUNTER — Other Ambulatory Visit (HOSPITAL_COMMUNITY)
Admission: RE | Admit: 2020-12-12 | Discharge: 2020-12-12 | Disposition: A | Discharge status: Home / Self Care | Payer: Medicare HMO | Source: Ambulatory Visit | Attending: Gastroenterology | Admitting: Gastroenterology

## 2020-12-12 DIAGNOSIS — I129 Hypertensive chronic kidney disease with stage 1 through stage 4 chronic kidney disease, or unspecified chronic kidney disease: Secondary | ICD-10-CM | POA: Diagnosis not present

## 2020-12-12 DIAGNOSIS — Z8249 Family history of ischemic heart disease and other diseases of the circulatory system: Secondary | ICD-10-CM | POA: Diagnosis not present

## 2020-12-12 DIAGNOSIS — E1122 Type 2 diabetes mellitus with diabetic chronic kidney disease: Secondary | ICD-10-CM | POA: Diagnosis not present

## 2020-12-12 DIAGNOSIS — Z841 Family history of disorders of kidney and ureter: Secondary | ICD-10-CM | POA: Diagnosis not present

## 2020-12-12 DIAGNOSIS — Z888 Allergy status to other drugs, medicaments and biological substances status: Secondary | ICD-10-CM | POA: Diagnosis not present

## 2020-12-12 DIAGNOSIS — N182 Chronic kidney disease, stage 2 (mild): Secondary | ICD-10-CM | POA: Diagnosis not present

## 2020-12-12 DIAGNOSIS — Z87891 Personal history of nicotine dependence: Secondary | ICD-10-CM | POA: Diagnosis not present

## 2020-12-12 DIAGNOSIS — D122 Benign neoplasm of ascending colon: Secondary | ICD-10-CM | POA: Diagnosis not present

## 2020-12-12 DIAGNOSIS — Z96653 Presence of artificial knee joint, bilateral: Secondary | ICD-10-CM | POA: Diagnosis not present

## 2020-12-12 DIAGNOSIS — Z9049 Acquired absence of other specified parts of digestive tract: Secondary | ICD-10-CM | POA: Diagnosis not present

## 2020-12-12 DIAGNOSIS — K649 Unspecified hemorrhoids: Secondary | ICD-10-CM | POA: Diagnosis not present

## 2020-12-12 DIAGNOSIS — Z885 Allergy status to narcotic agent status: Secondary | ICD-10-CM | POA: Diagnosis not present

## 2020-12-12 DIAGNOSIS — K635 Polyp of colon: Secondary | ICD-10-CM | POA: Diagnosis not present

## 2020-12-12 DIAGNOSIS — Z833 Family history of diabetes mellitus: Secondary | ICD-10-CM | POA: Diagnosis not present

## 2020-12-12 DIAGNOSIS — Z7901 Long term (current) use of anticoagulants: Secondary | ICD-10-CM | POA: Diagnosis not present

## 2020-12-12 DIAGNOSIS — Z7982 Long term (current) use of aspirin: Secondary | ICD-10-CM | POA: Diagnosis not present

## 2020-12-12 DIAGNOSIS — Z87442 Personal history of urinary calculi: Secondary | ICD-10-CM | POA: Diagnosis not present

## 2020-12-12 DIAGNOSIS — Z79899 Other long term (current) drug therapy: Secondary | ICD-10-CM | POA: Diagnosis not present

## 2020-12-12 DIAGNOSIS — Z8711 Personal history of peptic ulcer disease: Secondary | ICD-10-CM | POA: Diagnosis not present

## 2020-12-12 DIAGNOSIS — Z800 Family history of malignant neoplasm of digestive organs: Secondary | ICD-10-CM | POA: Diagnosis not present

## 2020-12-12 DIAGNOSIS — E7800 Pure hypercholesterolemia, unspecified: Secondary | ICD-10-CM | POA: Diagnosis not present

## 2020-12-12 DIAGNOSIS — K219 Gastro-esophageal reflux disease without esophagitis: Secondary | ICD-10-CM | POA: Diagnosis not present

## 2020-12-12 DIAGNOSIS — Z1211 Encounter for screening for malignant neoplasm of colon: Secondary | ICD-10-CM | POA: Diagnosis not present

## 2020-12-12 DIAGNOSIS — Z9884 Bariatric surgery status: Secondary | ICD-10-CM | POA: Diagnosis not present

## 2020-12-12 LAB — BASIC METABOLIC PANEL
Anion gap: 8 (ref 5–15)
BUN: 15 mg/dL (ref 8–23)
CO2: 26 mmol/L (ref 22–32)
Calcium: 9.1 mg/dL (ref 8.9–10.3)
Chloride: 101 mmol/L (ref 98–111)
Creatinine, Ser: 1.11 mg/dL — ABNORMAL HIGH (ref 0.44–1.00)
GFR, Estimated: 53 mL/min — ABNORMAL LOW (ref >60)
Glucose, Bld: 117 mg/dL — ABNORMAL HIGH (ref 70–99)
Potassium: 3.5 mmol/L (ref 3.5–5.1)
Sodium: 135 mmol/L (ref 135–145)

## 2020-12-13 ENCOUNTER — Ambulatory Visit (HOSPITAL_COMMUNITY): Payer: Medicare HMO | Admitting: Certified Registered"

## 2020-12-13 ENCOUNTER — Ambulatory Visit (HOSPITAL_COMMUNITY)
Admission: RE | Admit: 2020-12-13 | Discharge: 2020-12-13 | Disposition: A | Discharge status: Home / Self Care | Payer: Medicare HMO | Attending: Gastroenterology | Admitting: Gastroenterology

## 2020-12-13 ENCOUNTER — Other Ambulatory Visit: Payer: Self-pay

## 2020-12-13 ENCOUNTER — Encounter (HOSPITAL_COMMUNITY)
Admission: RE | Disposition: A | Discharge status: Home / Self Care | Payer: Self-pay | Source: Home / Self Care | Attending: Gastroenterology

## 2020-12-13 ENCOUNTER — Encounter (HOSPITAL_COMMUNITY): Payer: Self-pay | Admitting: Gastroenterology

## 2020-12-13 DIAGNOSIS — E7800 Pure hypercholesterolemia, unspecified: Secondary | ICD-10-CM | POA: Insufficient documentation

## 2020-12-13 DIAGNOSIS — E1122 Type 2 diabetes mellitus with diabetic chronic kidney disease: Secondary | ICD-10-CM | POA: Diagnosis not present

## 2020-12-13 DIAGNOSIS — Z8371 Family history of colonic polyps: Secondary | ICD-10-CM | POA: Insufficient documentation

## 2020-12-13 DIAGNOSIS — Z800 Family history of malignant neoplasm of digestive organs: Secondary | ICD-10-CM | POA: Diagnosis not present

## 2020-12-13 DIAGNOSIS — Z87891 Personal history of nicotine dependence: Secondary | ICD-10-CM | POA: Diagnosis not present

## 2020-12-13 DIAGNOSIS — D122 Benign neoplasm of ascending colon: Secondary | ICD-10-CM

## 2020-12-13 DIAGNOSIS — Z79899 Other long term (current) drug therapy: Secondary | ICD-10-CM | POA: Insufficient documentation

## 2020-12-13 DIAGNOSIS — I129 Hypertensive chronic kidney disease with stage 1 through stage 4 chronic kidney disease, or unspecified chronic kidney disease: Secondary | ICD-10-CM | POA: Diagnosis not present

## 2020-12-13 DIAGNOSIS — N182 Chronic kidney disease, stage 2 (mild): Secondary | ICD-10-CM | POA: Diagnosis not present

## 2020-12-13 DIAGNOSIS — Z7982 Long term (current) use of aspirin: Secondary | ICD-10-CM | POA: Insufficient documentation

## 2020-12-13 DIAGNOSIS — K635 Polyp of colon: Secondary | ICD-10-CM | POA: Insufficient documentation

## 2020-12-13 DIAGNOSIS — Z885 Allergy status to narcotic agent status: Secondary | ICD-10-CM | POA: Diagnosis not present

## 2020-12-13 DIAGNOSIS — Z9049 Acquired absence of other specified parts of digestive tract: Secondary | ICD-10-CM | POA: Insufficient documentation

## 2020-12-13 DIAGNOSIS — Z7901 Long term (current) use of anticoagulants: Secondary | ICD-10-CM | POA: Insufficient documentation

## 2020-12-13 DIAGNOSIS — K219 Gastro-esophageal reflux disease without esophagitis: Secondary | ICD-10-CM | POA: Insufficient documentation

## 2020-12-13 DIAGNOSIS — Z09 Encounter for follow-up examination after completed treatment for conditions other than malignant neoplasm: Secondary | ICD-10-CM | POA: Diagnosis not present

## 2020-12-13 DIAGNOSIS — Z8249 Family history of ischemic heart disease and other diseases of the circulatory system: Secondary | ICD-10-CM | POA: Insufficient documentation

## 2020-12-13 DIAGNOSIS — D125 Benign neoplasm of sigmoid colon: Secondary | ICD-10-CM | POA: Diagnosis not present

## 2020-12-13 DIAGNOSIS — Z87442 Personal history of urinary calculi: Secondary | ICD-10-CM | POA: Insufficient documentation

## 2020-12-13 DIAGNOSIS — Z96653 Presence of artificial knee joint, bilateral: Secondary | ICD-10-CM | POA: Insufficient documentation

## 2020-12-13 DIAGNOSIS — Z833 Family history of diabetes mellitus: Secondary | ICD-10-CM | POA: Insufficient documentation

## 2020-12-13 DIAGNOSIS — Z9884 Bariatric surgery status: Secondary | ICD-10-CM | POA: Insufficient documentation

## 2020-12-13 DIAGNOSIS — Z8711 Personal history of peptic ulcer disease: Secondary | ICD-10-CM | POA: Insufficient documentation

## 2020-12-13 DIAGNOSIS — Z1211 Encounter for screening for malignant neoplasm of colon: Secondary | ICD-10-CM | POA: Diagnosis not present

## 2020-12-13 DIAGNOSIS — Z888 Allergy status to other drugs, medicaments and biological substances status: Secondary | ICD-10-CM | POA: Insufficient documentation

## 2020-12-13 DIAGNOSIS — D120 Benign neoplasm of cecum: Secondary | ICD-10-CM

## 2020-12-13 DIAGNOSIS — K649 Unspecified hemorrhoids: Secondary | ICD-10-CM | POA: Diagnosis not present

## 2020-12-13 DIAGNOSIS — Z841 Family history of disorders of kidney and ureter: Secondary | ICD-10-CM | POA: Insufficient documentation

## 2020-12-13 DIAGNOSIS — Z86010 Personal history of colonic polyps: Secondary | ICD-10-CM | POA: Diagnosis not present

## 2020-12-13 HISTORY — PX: COLONOSCOPY WITH PROPOFOL: SHX5780

## 2020-12-13 HISTORY — PX: POLYPECTOMY: SHX5525

## 2020-12-13 LAB — HM COLONOSCOPY

## 2020-12-13 SURGERY — COLONOSCOPY WITH PROPOFOL
Anesthesia: General

## 2020-12-13 MED ORDER — PHENYLEPHRINE 40 MCG/ML (10ML) SYRINGE FOR IV PUSH (FOR BLOOD PRESSURE SUPPORT)
PREFILLED_SYRINGE | INTRAVENOUS | Status: AC
  Filled 2020-12-13: qty 10

## 2020-12-13 MED ORDER — PROPOFOL 500 MG/50ML IV EMUL
INTRAVENOUS | Status: DC | PRN
  Administered 2020-12-13: 150 ug/kg/min via INTRAVENOUS

## 2020-12-13 MED ORDER — PROPOFOL 10 MG/ML IV BOLUS
INTRAVENOUS | Status: DC | PRN
  Administered 2020-12-13: 40 mg via INTRAVENOUS
  Administered 2020-12-13: 100 mg via INTRAVENOUS
  Administered 2020-12-13 (×2): 40 mg via INTRAVENOUS

## 2020-12-13 MED ORDER — STERILE WATER FOR IRRIGATION IR SOLN
Status: DC | PRN
  Administered 2020-12-13: 200 mL

## 2020-12-13 MED ORDER — LACTATED RINGERS IV SOLN: INTRAVENOUS | Status: DC

## 2020-12-13 MED ORDER — PHENYLEPHRINE 40 MCG/ML (10ML) SYRINGE FOR IV PUSH (FOR BLOOD PRESSURE SUPPORT)
PREFILLED_SYRINGE | INTRAVENOUS | Status: DC | PRN
  Administered 2020-12-13 (×5): 80 ug via INTRAVENOUS

## 2020-12-13 MED ORDER — LIDOCAINE HCL (CARDIAC) PF 100 MG/5ML IV SOSY
PREFILLED_SYRINGE | INTRAVENOUS | Status: DC | PRN
  Administered 2020-12-13: 50 mg via INTRAVENOUS

## 2020-12-13 NOTE — H&P (Addendum)
Amy Reyes is an 70 y.o. female.   Chief Complaint: History of colon polyps, family history of colon cancer HPI: 70 year old female with past medical history of diabetes, GERD, hyperlipidemia, hypertension and arthritis, Who comes to the hospital for colonoscopy for surveillance of colonic polyps and family history of colon cancer.  The patient denies having any complaints such as melena, hematochezia, abdominal pain or distention, change in her bowel movement consistency or frequency, no changes in her weight recently.  Patient reports that her father was diagnosed with colon cancer in his late 9s and she has half sister that was diagnosed with colon cancer in his 36s.  Upon review of her previous procedures, the patient had her initial colonoscopy in 2019, an 18 mm polyp was removed from the cecum which was behind the ileocecal valve, 3 other polyps were removed from the ascending colon and sigmoid colon.  Pathology was consistent with tubular adenomas for all polyps.  Subsequent colonoscopy was performed 12/22/2018 which showed presence of 6 polyps located in the cecum, hepatic flexure and splenic flexure.  Pathology showed normal tissue in the cecal polyp but the rest of the polyps were tubular adenomas and there was 1 sessile serrated polyp.  Her last colonoscopy was performed in 03/23/2020 which showed a 10 mm residual polyp in the cecum behind the ileocecal valve which was removed in a piecemeal fashion.  She also had 3 other polyps removed from the ascending colon, hepatic flexure and rectum.  Pathology was consistent with a tubular adenoma.   Past Medical History:  Diagnosis Date  . Allergy    seasonal allergies  . Anemia    iron defiencey  . Arthritis    oa  . Bronchitis hx of  . Chronic kidney disease    ckd stage 2- 3 due to NSAIDS use no nephrologist  . Colon polyps   . Complication of anesthesia    slow to wake up one time, none recent  . Diabetes mellitus without  complication ([place])    borderline type 2 on metformin  . GERD (gastroesophageal reflux disease)    on meds  . History of carpal tunnel syndrome    years ago  . History of kidney stones   . History of rotator cuff tear    Bilateral  . Hypercholesteremia    on meds  . Hypertension    on meds  . Peptic ulcer   . PMB (postmenopausal bleeding)    last time middle of april 2021    Past Surgical History:  Procedure Laterality Date  . ABDOMINOPLASTY  30 yrs ago   tummy tuck  . BREAST SURGERY     reduction  . CHOLECYSTECTOMY  2000   laparoscopic  . COLONOSCOPY  2019   TA  . DILATATION & CURETTAGE/HYSTEROSCOPY WITH MYOSURE N/A 12/20/2019   Procedure: DILATATION & CURETTAGE/HYSTEROSCOPY WITH MYOSURE RESECTION OF POLYP;  Surgeon: Megan Salon, MD;  Location: [facility];  Service: Gynecology;  Laterality: N/A;  resection of polyp,  to follow first case  . INJECTION KNEE Left 10/14/2016   Procedure: CORTISONE LEFT KNEE INJECTION;  Surgeon: Gaynelle Arabian, MD;  [facility];  Service: Orthopedics;  Laterality: Left;  . LAPAROSCOPIC GASTRIC BANDING  2010 or 2011  . left knee injection  6 weeks ago  . POLYPECTOMY     TA  . ROTATOR CUFF REPAIR Left 05/2019  . TOTAL KNEE ARTHROPLASTY Right 10/14/2016   Procedure: RIGHT TOTAL KNEE ARTHROPLASTY;  Surgeon: Pilar Plate  Aluisio, MD;  [facility];  Service: Orthopedics;  Laterality: Right;  Adductor Block  . TOTAL KNEE ARTHROPLASTY Left 04/24/2020   Procedure: TOTAL KNEE ARTHROPLASTY;  Surgeon: Gaynelle Arabian, MD;  [facility];  Service: Orthopedics;  Laterality: Left;  68min    Family History  Problem Relation Age of Onset  . Hypertension Mother   . Thyroid disease Mother   . Kidney disease Sister   . Hypertension Sister   . Diabetes Maternal Grandfather   . Diabetes Maternal Aunt   . Diabetes Maternal Uncle   . Prostate cancer Father 24  . Colon cancer Father 10  . Colon polyps Father 68  . Hypertension Brother   .  Uterine cancer Maternal Aunt   . Rectal cancer Neg Hx   . Stomach cancer Neg Hx    Social History:  reports that she quit smoking about 37 years ago. Her smoking use included cigarettes. She quit after 1.00 year of use. She has never used smokeless tobacco. She reports current alcohol use of about 2.0 standard drinks of alcohol per week. She reports that she does not use drugs.  Allergies:  Allergies  Allergen Reactions  . Tramadol Itching  . Codeine     Does not like the side effects-post taking    Medications Prior to Admission  Medication Sig Dispense Refill  . albuterol (VENTOLIN HFA) 108 (90 Base) MCG/ACT inhaler Inhale 1-2 puffs into the lungs every 6 (six) hours as needed for wheezing or shortness of breath.    Marland Kitchen atorvastatin (LIPITOR) 80 MG tablet Take 80 mg by mouth in the morning.    . Cholecalciferol (VITAMIN D3) 50 MCG (2000 UT) TABS Take 6,000 Units by mouth in the morning.    . ferrous sulfate (SLOW IRON) 160 (50 Fe) MG TBCR SR tablet Take 1 tablet by mouth in the morning.    Marland Kitchen losartan-hydrochlorothiazide (HYZAAR) 100-25 MG tablet Take 1 tablet by mouth in the morning.    . Multiple Vitamin (MULTIVITAMIN WITH MINERALS) TABS tablet Take 1 tablet by mouth 2 (two) times a week.    . polyethylene glycol-electrolytes (TRILYTE) 420 g solution Take 4,000 mLs by mouth as directed. 4000 mL 0  . Semaglutide (OZEMPIC, 0.25 OR 0.5 MG/DOSE, [place]) Inject 0.25 mg into the skin every Monday.    . vitamin C (ASCORBIC ACID) 250 MG tablet Take 750 mg by mouth in the morning.    . cyclobenzaprine (FLEXERIL) 10 MG tablet Take 1 tablet (10 mg total) by mouth 3 (three) times daily as needed for muscle spasms. (Patient not taking: Reported on 11/30/2020) 40 tablet 0  . omeprazole (PRILOSEC) 40 MG capsule Take 1 capsule (40 mg total) by mouth daily. (Patient not taking: Reported on 11/30/2020) 90 capsule 3  . rivaroxaban (XARELTO) 10 MG TABS tablet Take 1 tablet (10 mg total) by mouth daily with  breakfast for 20 days. Then take one 81 mg aspirin once a day for three weeks. Then discontinue aspirin. (Patient not taking: Reported on 11/30/2020) 20 tablet 0    Results for orders placed or performed during the hospital encounter of 12/13/20 (from the past 48 hour(s))  Basic metabolic panel     Status: Abnormal   Collection Time: 12/12/20  3:23 PM  Result Value Ref Range   Sodium 135 135 - 145 mmol/L   Potassium 3.5 3.5 - 5.1 mmol/L   Chloride 101 98 - 111 mmol/L   CO2 26 22 - 32 mmol/L   Glucose, Bld  117 (H) 70 - 99 mg/dL    Comment: Glucose reference range applies only to samples taken after fasting for at least 8 hours.   BUN 15 8 - 23 mg/dL   Creatinine, Ser 1.11 (H) 0.44 - 1.00 mg/dL   Calcium 9.1 8.9 - 10.3 mg/dL   GFR, Estimated 53 (L) >60 mL/min    Comment: (NOTE) Calculated using the CKD-EPI Creatinine Equation (2021)    Anion gap 8 5 - 15    Comment: Performed at [facility], [address], [place], [place] [address]   No results found.  Review of Systems  Constitutional: Negative.   HENT: Negative.   Eyes: Negative.   Respiratory: Negative.   Cardiovascular: Negative.   Gastrointestinal: Negative.   Endocrine: Negative.   Genitourinary: Negative.   Musculoskeletal: Negative.   Skin: Negative.   Allergic/Immunologic: Negative.   Neurological: Negative.   Hematological: Negative.   Psychiatric/Behavioral: Negative.     Blood pressure 137/64, pulse 69, temperature 98.2 F (36.8 C), temperature source Oral, resp. rate 15, height 5\' 6"  (1.676 m), SpO2 98 %. Physical Exam  GENERAL: The patient is AO x3, in no acute distress. Obese. HEENT: Head is normocephalic and atraumatic. EOMI are intact. Mouth is well hydrated and without lesions. NECK: Supple. No masses LUNGS: Clear to auscultation. No presence of rhonchi/wheezing/rales. Adequate chest expansion HEART: RRR, normal s1 and s2. ABDOMEN: Soft, nontender, no guarding, no peritoneal signs, and nondistended.  BS +. No masses. EXTREMITIES: Without any cyanosis, clubbing, rash, lesions or edema. NEUROLOGIC: AOx3, no focal motor deficit. SKIN: no jaundice, no rashes  Assessment/Plan 70 year old female with past medical history of diabetes, GERD, hyperlipidemia, hypertension and arthritis, Who comes to the hospital for colonoscopy for surveillance of colonic polyps and family history of colon cancer.  We will proceed with a colonoscopy today  Harvel Quale, MD 12/13/2020, 10:32 AM

## 2020-12-13 NOTE — Anesthesia Preprocedure Evaluation (Signed)
Anesthesia Evaluation  Patient identified by MRN, date of birth, ID band Patient awake    Reviewed: Allergy & Precautions, H&P , NPO status , Patient's Chart, lab work & pertinent test results, reviewed documented beta blocker date and time   History of Anesthesia Complications (+) PROLONGED EMERGENCE and history of anesthetic complications  Airway Mallampati: II  TM Distance: >3 FB Neck ROM: full    Dental no notable dental hx.    Pulmonary neg pulmonary ROS, former smoker,    Pulmonary exam normal breath sounds clear to auscultation       Cardiovascular Exercise Tolerance: Good hypertension, negative cardio ROS   Rhythm:regular Rate:Normal     Neuro/Psych negative neurological ROS  negative psych ROS   GI/Hepatic Neg liver ROS, PUD, GERD  Medicated,  Endo/Other  negative endocrine ROSdiabetes  Renal/GU negative Renal ROS  negative genitourinary   Musculoskeletal   Abdominal   Peds  Hematology  (+) Blood dyscrasia, anemia ,   Anesthesia Other Findings   Reproductive/Obstetrics negative OB ROS                             Anesthesia Physical Anesthesia Plan  ASA: III  Anesthesia Plan: General   Post-op Pain Management:    Induction:   PONV Risk Score and Plan: Propofol infusion  Airway Management Planned:   Additional Equipment:   Intra-op Plan:   Post-operative Plan:   Informed Consent: I have reviewed the patients History and Physical, chart, labs and discussed the procedure including the risks, benefits and alternatives for the proposed anesthesia with the patient or authorized representative who has indicated his/her understanding and acceptance.     Dental Advisory Given  Plan Discussed with: CRNA  Anesthesia Plan Comments:         Anesthesia Quick Evaluation

## 2020-12-13 NOTE — Anesthesia Postprocedure Evaluation (Signed)
Anesthesia Post Note  Patient: Amy Reyes  Procedure(s) Performed: COLONOSCOPY WITH PROPOFOL (N/A ) POLYPECTOMY  Anesthesia Type: General Anesthetic complications: no   No complications documented.   Last Vitals:  Vitals:   12/13/20 0958 12/13/20 1129  BP: 137/64 104/62  Pulse: 69   Resp: 15 16  Temp: 36.8 C 36.4 C  SpO2: 98% 97%    Last Pain:  Vitals:   12/13/20 1129  TempSrc: Oral  PainSc: 0-No pain                 Louann Sjogren

## 2020-12-13 NOTE — Discharge Instructions (Signed)
You are being discharged to home.  Resume your previous diet.  We are waiting for your pathology results.  Your physician has recommended a repeat colonoscopy in two years for surveillance.  Referral to [facility] for cecectomy.   Colonoscopy, Adult, Care After This sheet gives you information about how to care for yourself after your procedure. Your doctor may also give you more specific instructions. If you have problems or questions, call your doctor. What can I expect after the procedure? After the procedure, it is common to have:  A small amount of blood in your poop (stool) for 24 hours.  Some gas.  Mild cramping or bloating in your belly (abdomen). Follow these instructions at home: Eating and drinking  Drink enough fluid to keep your pee (urine) pale yellow.  Follow instructions from your doctor about what you cannot eat or drink.  Return to your normal diet as told by your doctor. Avoid heavy or fried foods that are hard to digest.   Activity  Rest as told by your doctor.  Do not sit for a long time without moving. Get up to take short walks every 1-2 hours. This is important. Ask for help if you feel weak or unsteady.  Return to your normal activities as told by your doctor. Ask your doctor what activities are safe for you. To help cramping and bloating:  Try walking around.  Put heat on your belly as told by your doctor. Use the heat source that your doctor recommends, such as a moist heat pack or a heating pad. ? Put a towel between your skin and the heat source. ? Leave the heat on for 20-30 minutes. ? Remove the heat if your skin turns bright red. This is very important if you are unable to feel pain, heat, or cold. You may have a greater risk of getting burned.   General instructions  If you were given a medicine to help you relax (sedative) during your procedure, it can affect you for many hours. Do not drive or use machinery until your doctor  says that it is safe.  For the first 24 hours after the procedure: ? Do not sign important documents. ? Do not drink alcohol. ? Do your daily activities more slowly than normal. ? Eat foods that are soft and easy to digest.  Take over-the-counter or prescription medicines only as told by your doctor.  Keep all follow-up visits as told by your doctor. This is important. Contact a doctor if:  You have blood in your poop 2-3 days after the procedure. Get help right away if:  You have more than a small amount of blood in your poop.  You see large clumps of tissue (blood clots) in your poop.  Your belly is swollen.  You feel like you may vomit (nauseous).  You vomit.  You have a fever.  You have belly pain that gets worse, and medicine does not help your pain. Summary  After the procedure, it is common to have a small amount of blood in your poop. You may also have mild cramping and bloating in your belly.  If you were given a medicine to help you relax (sedative) during your procedure, it can affect you for many hours. Do not drive or use machinery until your doctor says that it is safe.  Get help right away if you have a lot of blood in your poop, feel like you may vomit, have a fever, or have more  belly pain. This information is not intended to replace advice given to you by your health care provider. Make sure you discuss any questions you have with your health care provider. Document Revised: 05/07/2019 Document Reviewed: 01/25/2019 Elsevier Patient Education  [place].  Colon Polyps  Colon polyps are tissue growths inside the colon, which is part of the large intestine. They are one of the types of polyps that can grow in the body. A polyp may be a round bump or a mushroom-shaped growth. You could have one polyp or more than one. Most colon polyps are noncancerous (benign). However, some colon polyps can become cancerous over time. Finding and removing the polyps  early can help prevent this. What are the causes? The exact cause of colon polyps is not known. What increases the risk? The following factors may make you more likely to develop this condition:  Having a family history of colorectal cancer or colon polyps.  Being older than 70 years of age.  Being younger than 70 years of age and having a significant family history of colorectal cancer or colon polyps or a genetic condition that puts you at higher risk of getting colon polyps.  Having inflammatory bowel disease, such as ulcerative colitis or Crohn's disease.  Having certain conditions passed from parent to child (hereditary conditions), such as: ? Familial adenomatous polyposis (FAP). ? Lynch syndrome. ? Turcot syndrome. ? Peutz-Jeghers syndrome. ? MUTYH-associated polyposis (MAP).  Being overweight.  Certain lifestyle factors. These include smoking cigarettes, drinking too much alcohol, not getting enough exercise, and eating a diet that is high in fat and red meat and low in fiber.  Having had childhood cancer that was treated with radiation of the abdomen. What are the signs or symptoms? Many times, there are no symptoms. If you have symptoms, they may include:  Blood coming from the rectum during a bowel movement.  Blood in the stool (feces). The blood may be bright red or very dark in color.  Pain in the abdomen.  A change in bowel habits, such as constipation or diarrhea. How is this diagnosed? This condition is diagnosed with a colonoscopy. This is a procedure in which a lighted, flexible scope is inserted into the opening between the buttocks (anus) and then passed into the colon to examine the area. Polyps are sometimes found when a colonoscopy is done as part of routine cancer screening tests. How is this treated? This condition is treated by removing any polyps that are found. Most polyps can be removed during a colonoscopy. Those polyps will then be tested for  cancer. Additional treatment may be needed depending on the results of testing. Follow these instructions at home: Eating and drinking  Eat foods that are high in fiber, such as fruits, vegetables, and whole grains.  Eat foods that are high in calcium and vitamin D, such as milk, cheese, yogurt, eggs, liver, fish, and broccoli.  Limit foods that are high in fat, such as fried foods and desserts.  Limit the amount of red meat, precooked or cured meat, or other processed meat that you eat, such as hot dogs, sausages, bacon, or meat loaves.  Limit sugary drinks.   Lifestyle  Maintain a healthy weight, or lose weight if recommended by your health care provider.  Exercise every day or as told by your health care provider.  Do not use any products that contain nicotine or tobacco, such as cigarettes, e-cigarettes, and chewing tobacco. If you need help quitting, ask  your health care provider.  Do not drink alcohol if: ? Your health care provider tells you not to drink. ? You are pregnant, may be pregnant, or are planning to become pregnant.  If you drink alcohol: ? Limit how much you use to:  0-1 drink a day for women.  0-2 drinks a day for men. ? Know how much alcohol is in your drink. In the [place] one drink equals one 12 oz bottle of beer (355 mL), one 5 oz glass of wine (148 mL), or one 1 oz glass of hard liquor (44 mL). General instructions  Take over-the-counter and prescription medicines only as told by your health care provider.  Keep all follow-up visits. This is important. This includes having regularly scheduled colonoscopies. Talk to your health care provider about when you need a colonoscopy. Contact a health care provider if:  You have new or worsening bleeding during a bowel movement.  You have new or increased blood in your stool.  You have a change in bowel habits.  You lose weight for no known reason. Summary  Colon polyps are tissue growths inside the  colon, which is part of the large intestine. They are one type of polyp that can grow in the body.  Most colon polyps are noncancerous (benign), but some can become cancerous over time.  This condition is diagnosed with a colonoscopy.  This condition is treated by removing any polyps that are found. Most polyps can be removed during a colonoscopy. This information is not intended to replace advice given to you by your health care provider. Make sure you discuss any questions you have with your health care provider. Document Revised: 10/20/2019 Document Reviewed: 10/20/2019 Elsevier Patient Education  2021 [employer].

## 2020-12-13 NOTE — Anesthesia Procedure Notes (Signed)
Date/Time: 12/13/2020 10:45 AM Performed by: Orlie Dakin, CRNA Pre-anesthesia Checklist: Patient identified, Emergency Drugs available, Suction available and Patient being monitored Oxygen Delivery Method: Nasal cannula Induction Type: IV induction Placement Confirmation: positive ETCO2

## 2020-12-13 NOTE — Op Note (Signed)
[facility] Patient Name: Amy Reyes Procedure Date: 12/13/2020 10:20 AM MRN: [id number] Date of Birth: [birth year] Attending MD: Maylon Peppers ,  CSN: [id number] Age: 70 Admit Type: Outpatient Procedure:                Colonoscopy Indications:              Screening patient at increased risk: Colorectal                            cancer in multiple 1st-degree relatives before age                            104 years, Surveillance: History of piecemeal                            removal adenoma on last colonoscopy (< 3 yrs) Providers:                Maylon Peppers, Gwenlyn Fudge, RN, Aram Candela Referring MD:              Medicines:                Monitored Anesthesia Care Complications:            No immediate complications. Estimated Blood Loss:     Estimated blood loss: none. Procedure:                Pre-Anesthesia Assessment:                           - Prior to the procedure, a History and Physical                            was performed, and patient medications, allergies                            and sensitivities were reviewed. The patient's                            tolerance of previous anesthesia was reviewed.                           - The risks and benefits of the procedure and the                            sedation options and risks were discussed with the                            patient. All questions were answered and informed                            consent was obtained.                           - ASA Grade Assessment: II - A patient with mild  systemic disease.                           After obtaining informed consent, the colonoscope                            was passed under direct vision. Throughout the                            procedure, the patient's blood pressure, pulse, and                            oxygen saturations were monitored continuously. The                            [id number]-[id number] ([id number])  scope was introduced through                            the anus and advanced to the the cecum, identified                            by appendiceal orifice and ileocecal valve. The                            colonoscopy was technically difficult and complex                            due to difficult position and lifting of polyp. The                            patient tolerated the procedure well. The quality                            of the bowel preparation was good. Scope In: 10:43:02 AM Scope Out: 11:24:50 AM Scope Withdrawal Time: 0 hours 35 minutes 32 seconds  Total Procedure Duration: 0 hours 41 minutes 48 seconds  Findings:      Hemorrhoids were found on perianal exam.      A 10 mm polyp was found in the cecum, next to the appendiceal orifice       and just distal to the IC valve. The polyp was carpet-like but the       margins were not clear,it had an adenomatous appearance. Inspection of       the borders was attempted by pulling the polyp with a forceps - the       polyp felt hard when the forceps was closed. Area was injected with 10       mL Eleview for a lift polypectomy but there was significant resistance       to injection. The polyp could not be lifted as I suspect there was       significant scarring, there was presence of Eleview lifting agent in the       tissue in proximity to the polyp.      Two sessile polyps were found in the sigmoid colon and ascending colon.       The polyps were 2 to  3 mm in size. These polyps were removed with a cold       snare. Resection and retrieval were complete.      The retroflexed view of the distal rectum and anal verge was normal and       showed no anal or rectal abnormalities. Impression:               - Hemorrhoids found on perianal exam.                           - One 10 mm polyp in the cecum, next to the                            appendix. Was not resected given poor lifting.                           - Two 2 to 3 mm  polyps in the sigmoid colon and in                            the ascending colon, removed with a cold snare.                            Resected and retrieved.                           - The distal rectum and anal verge are normal on                            retroflexion view. Moderate Sedation:      Per Anesthesia Care Recommendation:           - Discharge patient to home (ambulatory).                           - Resume previous diet.                           - Await pathology results.                           - Repeat colonoscopy in 2 years for surveillance.                           - Referral to [facility] for                            cecectomy. Procedure Code(s):        --- Professional ---                           [phone], Colonoscopy, flexible; with removal of                            tumor(s), polyp(s), or other lesion(s) by snare  technique                           [id number], Colonoscopy, flexible; with directed                            submucosal injection(s), any substance Diagnosis Code(s):        --- Professional ---                           K63.5, Polyp of colon                           Z80.0, Family history of malignant neoplasm of                            digestive organs                           Z86.010, Personal history of colonic polyps                           K64.9, Unspecified hemorrhoids CPT copyright 2019 American Medical Association. All rights reserved. The codes documented in this report are preliminary and upon coder review may  be revised to meet current compliance requirements. Maylon Peppers, MD Maylon Peppers,  12/13/2020 11:53:43 AM This report has been signed electronically. Number of Addenda: 0

## 2020-12-13 NOTE — Transfer of Care (Signed)
Immediate Anesthesia Transfer of Care Note  Patient: Amy Reyes  Procedure(s) Performed: COLONOSCOPY WITH PROPOFOL (N/A ) POLYPECTOMY  Patient Location: Endoscopy Unit  Anesthesia Type:General  Level of Consciousness: awake, alert  and oriented  Airway & Oxygen Therapy: Patient Spontanous Breathing  Post-op Assessment: Report given to RN, Post -op Vital signs reviewed and stable and Patient moving all extremities X 4  Post vital signs: Reviewed and stable  Last Vitals:  Vitals Value Taken Time  BP    Temp    Pulse    Resp    SpO2      Last Pain:  Vitals:   12/13/20 1040  TempSrc:   PainSc: 0-No pain         Complications: No complications documented.

## 2020-12-14 ENCOUNTER — Encounter (INDEPENDENT_AMBULATORY_CARE_PROVIDER_SITE_OTHER): Payer: Self-pay | Admitting: *Deleted

## 2020-12-14 LAB — SURGICAL PATHOLOGY

## 2020-12-22 ENCOUNTER — Encounter (HOSPITAL_COMMUNITY): Payer: Self-pay | Admitting: Gastroenterology

## 2020-12-22 DIAGNOSIS — Z96652 Presence of left artificial knee joint: Secondary | ICD-10-CM | POA: Diagnosis not present

## 2021-01-22 ENCOUNTER — Ambulatory Visit (HOSPITAL_COMMUNITY): Payer: Medicare HMO | Attending: Orthopedic Surgery | Admitting: Physical Therapy

## 2021-01-22 ENCOUNTER — Other Ambulatory Visit: Payer: Self-pay

## 2021-01-22 ENCOUNTER — Encounter (HOSPITAL_COMMUNITY): Payer: Self-pay | Admitting: Physical Therapy

## 2021-01-22 DIAGNOSIS — M25562 Pain in left knee: Secondary | ICD-10-CM | POA: Insufficient documentation

## 2021-01-22 DIAGNOSIS — M25662 Stiffness of left knee, not elsewhere classified: Secondary | ICD-10-CM | POA: Diagnosis not present

## 2021-01-22 NOTE — Patient Instructions (Signed)
Access Code: [email or web address] URL: [email or web address] Date: 01/22/2021 Prepared by: Josue Hector  Exercises Long Sitting Calf Stretch with Strap - 3-4 x daily - 7 x weekly - 1 sets - 10 reps - 10 second hold Supine Hamstring Stretch with Strap - 3-4 x daily - 7 x weekly - 1 sets - 10 reps - 10 second hold Supine Heel Slide with Strap - 3-4 x daily - 7 x weekly - 1 sets - 10 reps - 10 second hold

## 2021-01-22 NOTE — Therapy (Signed)
[facility] [address] [place], [place], [address] Phone: [phone]   Fax:  [phone]  Physical Therapy Evaluation  Patient Details  Name: Amy Reyes MRN: [id number] Date of Birth: [birth year] Referring Provider (PT): Gaynelle Arabian MD   Encounter Date: 01/22/2021   PT End of Session - 01/22/21 1115     Visit Number 1    Number of Visits 8    Date for PT Re-Evaluation 02/19/21    Authorization Type [employer]    Authorization Time Period Check auth    PT Start Time 1040    PT Stop Time 1115    PT Time Calculation (min) 35 min    Activity Tolerance Patient tolerated treatment well    Behavior During Therapy [facility] for tasks assessed/performed             Past Medical History:  Diagnosis Date   Allergy    seasonal allergies   Anemia    iron defiencey   Arthritis    oa   Bronchitis hx of   Chronic kidney disease    ckd stage 2- 3 due to NSAIDS use no nephrologist   Colon polyps    Complication of anesthesia    slow to wake up one time, none recent   Diabetes mellitus without complication (HCC)    borderline type 2 on metformin   GERD (gastroesophageal reflux disease)    on meds   History of carpal tunnel syndrome    years ago   History of kidney stones    History of rotator cuff tear    Bilateral   Hypercholesteremia    on meds   Hypertension    on meds   Peptic ulcer    PMB (postmenopausal bleeding)    last time middle of april 2021    Past Surgical History:  Procedure Laterality Date   ABDOMINOPLASTY  30 yrs ago   tummy tuck   BREAST SURGERY     reduction   CHOLECYSTECTOMY  2000   laparoscopic   COLONOSCOPY  2019   TA   COLONOSCOPY WITH PROPOFOL N/A 12/13/2020   Procedure: COLONOSCOPY WITH PROPOFOL;  Surgeon: Harvel Quale, MD;  [facility];  Service: Gastroenterology;  Laterality: N/A;  11:00   DILATATION & CURETTAGE/HYSTEROSCOPY WITH MYOSURE N/A 12/20/2019    Procedure: DILATATION & CURETTAGE/HYSTEROSCOPY WITH MYOSURE RESECTION OF POLYP;  Surgeon: Megan Salon, MD;  Location: [place] [facility];  Service: Gynecology;  Laterality: N/A;  resection of polyp,  to follow first case   INJECTION KNEE Left 10/14/2016   Procedure: CORTISONE LEFT KNEE INJECTION;  Surgeon: Gaynelle Arabian, MD;  [facility];  Service: Orthopedics;  Laterality: Left;   LAPAROSCOPIC GASTRIC BANDING  2010 or 2011   left knee injection  6 weeks ago   POLYPECTOMY     TA   POLYPECTOMY  12/13/2020   Procedure: POLYPECTOMY;  Surgeon: Harvel Quale, MD;  [facility];  Service: Gastroenterology;;  ascending colon   ROTATOR CUFF REPAIR Left 05/2019   TOTAL KNEE ARTHROPLASTY Right 10/14/2016   Procedure: RIGHT TOTAL KNEE ARTHROPLASTY;  Surgeon: Gaynelle Arabian, MD;  Location: [facility];  Service: Orthopedics;  Laterality: Right;  Adductor Block   TOTAL KNEE ARTHROPLASTY Left 04/24/2020   Procedure: TOTAL KNEE ARTHROPLASTY;  Surgeon: Gaynelle Arabian, MD;  Location[facility];  Service: Orthopedics;  Laterality: Left;  63min    There were no vitals filed for this visit.  Subjective Assessment - 01/22/21 1051     Subjective Patient presents to therapy with complaint of LT knee stiffness. She has been seen in this clinic previously for LT TKA and had done well. She states she does very well during the day but Lt knee feels very stiff and sore at night time. Much improved since surgery, but still dealing with some tightness. No meds currently. Has DC therapy exercise and stretches, but walks regularly.    Pertinent History LT TKA    Limitations Other (comment)   sleeping   Patient Stated Goals Not feel tightness    Currently in Pain? Yes    Pain Score 8     Pain Location Knee    Pain Orientation Left    Pain Descriptors / Indicators Sore;Tightness    Pain Type Chronic pain    Pain Onset More than a month ago    Pain Frequency Intermittent    Aggravating  Factors  Nightime, prolonged position    Pain Relieving Factors walking, moving    Effect of Pain on Daily Activities Limits                [facility] PT Assessment - 01/22/21 0001       Assessment   Medical Diagnosis Lt knee stiffness    Referring Provider (PT) Gaynelle Arabian MD    Prior Therapy Yes      Balance Screen   Has the patient fallen in the past 6 months No      [place] residence    Living Arrangements Other relatives      Prior Function   Level of Independence Independent      Cognition   Overall Cognitive Status Within Functional Limits for tasks assessed      Observation/Other Assessments   Focus on Therapeutic Outcomes (FOTO)  Complete next visit      ROM / Strength   AROM / PROM / Strength AROM;Strength      AROM   AROM Assessment Site Knee    Right/Left Knee Right;Left    Right Knee Extension 0    Right Knee Flexion 114    Left Knee Extension 4    Left Knee Flexion 102      Strength   Strength Assessment Site Hip;Knee    Right/Left Hip Right;Left    Right Hip Flexion 5/5    Right Hip Extension 4+/5    Right Hip ABduction 4+/5    Left Hip Flexion 5/5    Left Hip Extension 4+/5    Left Hip ABduction 4/5    Right/Left Knee Right;Left    Right Knee Extension 5/5    Left Knee Extension 5/5      Flexibility   Soft Tissue Assessment /Muscle Length --   Mod restriciton in bilateral quad flexibility                       Objective measurements completed on examination: See above findings.       [facility] Adult PT Treatment/Exercise - 01/22/21 0001       Exercises   Exercises Knee/Hip      Knee/Hip Exercises: Stretches   Other Knee/Hip Stretches supine calf stretch 5 x 10", hamstring stretch 5 x 10", AAROM heel slides with strap 5 x 10"                    PT Education - 01/22/21 1057  Education Details On evaluation findings, POC and HEP    Person(s) Educated Patient     Methods Explanation;Handout    Comprehension Verbalized understanding              PT Short Term Goals - 01/22/21 1416       PT SHORT TERM GOAL #1   Title Patient will be independent with initial HEP and self-management strategies to improve functional outcomes    Time 2    Period Weeks    Status New    Target Date 02/05/21               PT Long Term Goals - 01/22/21 1425       PT LONG TERM GOAL #1   Title Patient will improve FOTO score to predicted value to indicate improvement in functional outcomes    Time 4    Period Weeks    Status New    Target Date 02/19/21      PT LONG TERM GOAL #2   Title Patient will report at least 80% overall improvement in subjective complaint to indicate improvement in ability to perform ADLs.    Time 4    Period Weeks    Status New    Target Date 02/19/21      PT LONG TERM GOAL #3   Title Patient will have LT knee AROM 0-115 degrees to improve functional mobility and facilitate squatting to pick up items from floor and ambualting stair with alternating pattern.    Time 4    Period Weeks    Status New    Target Date 02/19/21                    Plan - 01/22/21 1413     Clinical Impression Statement Patient is a 70 y.o. female who presents to physical therapy with complaint of Lt knee pain/ stiffness. Patient demonstrates decreased strength, ROM restriction, decreased flexibility and decreased gait speed which are likely contributing to symptoms of pain and are negatively impacting patient ability to perform ADLs and functional mobility tasks. Patient will benefit from skilled physical therapy services to address these deficits to reduce pain and improve level of function with ADLs and functional mobility tasks.    Examination-Activity Limitations Squat;Stairs;Sleep;Locomotion Level    Examination-Participation Restrictions Other;Yard Work;Community Activity    Stability/Clinical Decision Making Stable/Uncomplicated     Clinical Decision Making Low    Rehab Potential Good    PT Frequency 2x / week    PT Duration 4 weeks    PT Treatment/Interventions ADLs/Self Care Home Management;Aquatic Therapy;Biofeedback;Cryotherapy;Electrical Stimulation;Contrast Bath;Therapeutic exercise;Orthotic Fit/Training;Patient/family education;Manual lymph drainage;Compression bandaging;Energy conservation;Splinting;Taping;Vasopneumatic Device;Joint Manipulations;Spinal Manipulations;Manual techniques;Traction;Therapeutic activities;Functional mobility training;Ultrasound;Parrafin;Fluidtherapy;Neuromuscular re-education;Stair training;Gait training;Balance training;DME Instruction;Scar mobilization;Passive range of motion;Vestibular;Visual/perceptual remediation/compensation;Dry needling;Iontophoresis 4mg /ml Dexamethasone;Moist Heat    PT Next Visit Plan Review goals and HEP. Complete FOTO (time constraint at [facility]). Progress knee mobility with stretching and strenghthening as tolerated.    PT Home Exercise Plan Eval: calf stretch, hamstring stretch, AAROM heel slide    Consulted and Agree with Plan of Care Patient             Patient will benefit from skilled therapeutic intervention in order to improve the following deficits and impairments:  Pain, Increased fascial restricitons, Abnormal gait, Decreased mobility, Decreased activity tolerance, Decreased range of motion, Decreased strength, Impaired flexibility  Visit Diagnosis: Stiffness of left knee, not elsewhere classified  Left knee pain, unspecified chronicity     Problem List Patient  Active Problem List   Diagnosis Date Noted   Primary osteoarthritis of right knee 04/24/2020   Endometrial polyp 03/19/2020   Fibroids, intramural 11/12/2019   Hypertension    Hypercholesteremia    History of kidney stones    Diabetes mellitus without complication ([place])    Colon polyps    OA (osteoarthritis) of knee 10/14/2016   2:49 PM, 01/22/21 Josue Hector PT DPT   Physical Therapist with [facility]  [phone] [place], [place], [address] Phone: [phone]   Fax:  [phone]  Name: Amy Reyes MRN: [id number] Date of Birth: 26-Oct-1950

## 2021-01-29 ENCOUNTER — Ambulatory Visit: Payer: Self-pay | Admitting: Surgery

## 2021-01-29 DIAGNOSIS — Z9884 Bariatric surgery status: Secondary | ICD-10-CM | POA: Diagnosis not present

## 2021-01-29 DIAGNOSIS — D120 Benign neoplasm of cecum: Secondary | ICD-10-CM | POA: Diagnosis not present

## 2021-01-30 ENCOUNTER — Telehealth (HOSPITAL_COMMUNITY): Payer: Self-pay | Admitting: Physical Therapy

## 2021-01-30 ENCOUNTER — Ambulatory Visit (HOSPITAL_COMMUNITY): Payer: Medicare HMO | Admitting: Physical Therapy

## 2021-01-30 NOTE — Telephone Encounter (Signed)
Pt called to cx her daughter fell in the tube and broke her front tooth off. Offered later apptment  today and patient declined.

## 2021-02-01 ENCOUNTER — Ambulatory Visit (HOSPITAL_COMMUNITY): Payer: Medicare HMO

## 2021-02-01 ENCOUNTER — Telehealth (HOSPITAL_COMMUNITY): Payer: Self-pay

## 2021-02-01 NOTE — Telephone Encounter (Signed)
No show #1, called and spoke to pt. who stated she is headed to [place], her daughter in hospital and having emergency surgery ([facility]) today.  Reminded next apt date and time and encouraged pt to call and cancel/reschedule if unable to make next apt.    Ihor Austin, LPTA/CLT; Delana Meyer [phone]

## 2021-02-06 ENCOUNTER — Ambulatory Visit (HOSPITAL_COMMUNITY): Payer: Medicare HMO | Admitting: Physical Therapy

## 2021-02-08 ENCOUNTER — Encounter (HOSPITAL_COMMUNITY): Payer: Medicare HMO | Admitting: Physical Therapy

## 2021-02-11 DIAGNOSIS — E1129 Type 2 diabetes mellitus with other diabetic kidney complication: Secondary | ICD-10-CM | POA: Diagnosis not present

## 2021-02-11 DIAGNOSIS — E7800 Pure hypercholesterolemia, unspecified: Secondary | ICD-10-CM | POA: Diagnosis not present

## 2021-02-11 DIAGNOSIS — I129 Hypertensive chronic kidney disease with stage 1 through stage 4 chronic kidney disease, or unspecified chronic kidney disease: Secondary | ICD-10-CM | POA: Diagnosis not present

## 2021-02-11 DIAGNOSIS — D631 Anemia in chronic kidney disease: Secondary | ICD-10-CM | POA: Diagnosis not present

## 2021-02-13 ENCOUNTER — Encounter (HOSPITAL_COMMUNITY): Payer: Self-pay | Admitting: Physical Therapy

## 2021-02-13 ENCOUNTER — Other Ambulatory Visit: Payer: Self-pay

## 2021-02-13 ENCOUNTER — Ambulatory Visit (HOSPITAL_COMMUNITY): Payer: Medicare HMO | Attending: Orthopedic Surgery | Admitting: Physical Therapy

## 2021-02-13 DIAGNOSIS — M25662 Stiffness of left knee, not elsewhere classified: Secondary | ICD-10-CM

## 2021-02-13 DIAGNOSIS — M25562 Pain in left knee: Secondary | ICD-10-CM | POA: Diagnosis not present

## 2021-02-13 NOTE — Therapy (Signed)
[facility] [address] [place], [place], [address] Phone: [phone]   Fax:  [phone]  Physical Therapy Treatment  Patient Details  Name: Amy Reyes MRN: [id number] Date of Birth: 02/13/1951 Referring Provider (PT): Gaynelle Arabian MD   Encounter Date: 02/13/2021   PT End of Session - 02/13/21 1133     Visit Number 2    Number of Visits 8    Date for PT Re-Evaluation 02/19/21    Authorization Type [employer]    Authorization Time Period 8 visits 7/11-02/19/21    Authorization - Visit Number 2    Authorization - Number of Visits 8    PT Start Time 1128   arrived late   PT Stop Time [id number]    PT Time Calculation (min) 30 min    Activity Tolerance Patient tolerated treatment well    Behavior During Therapy [facility] for tasks assessed/performed             Past Medical History:  Diagnosis Date   Allergy    seasonal allergies   Anemia    iron defiencey   Arthritis    oa   Bronchitis hx of   Chronic kidney disease    ckd stage 2- 3 due to NSAIDS use no nephrologist   Colon polyps    Complication of anesthesia    slow to wake up one time, none recent   Diabetes mellitus without complication (HCC)    borderline type 2 on metformin   GERD (gastroesophageal reflux disease)    on meds   History of carpal tunnel syndrome    years ago   History of kidney stones    History of rotator cuff tear    Bilateral   Hypercholesteremia    on meds   Hypertension    on meds   Peptic ulcer    PMB (postmenopausal bleeding)    last time middle of april 2021    Past Surgical History:  Procedure Laterality Date   ABDOMINOPLASTY  30 yrs ago   tummy tuck   BREAST SURGERY     reduction   CHOLECYSTECTOMY  2000   laparoscopic   COLONOSCOPY  2019   TA   COLONOSCOPY WITH PROPOFOL N/A 12/13/2020   Procedure: COLONOSCOPY WITH PROPOFOL;  Surgeon: Harvel Quale, MD;  [facility];  Service: Gastroenterology;   Laterality: N/A;  11:00   DILATATION & CURETTAGE/HYSTEROSCOPY WITH MYOSURE N/A 12/20/2019   Procedure: DILATATION & CURETTAGE/HYSTEROSCOPY WITH MYOSURE RESECTION OF POLYP;  Surgeon: Megan Salon, MD;  Location: [facility];  Service: Gynecology;  Laterality: N/A;  resection of polyp,  to follow first case   INJECTION KNEE Left 10/14/2016   Procedure: CORTISONE LEFT KNEE INJECTION;  Surgeon: Gaynelle Arabian, MD;  [facility];  Service: Orthopedics;  Laterality: Left;   LAPAROSCOPIC GASTRIC BANDING  2010 or 2011   left knee injection  6 weeks ago   POLYPECTOMY     TA   POLYPECTOMY  12/13/2020   Procedure: POLYPECTOMY;  Surgeon: Harvel Quale, MD;  [facility];  Service: Gastroenterology;;  ascending colon   ROTATOR CUFF REPAIR Left 05/2019   TOTAL KNEE ARTHROPLASTY Right 10/14/2016   Procedure: RIGHT TOTAL KNEE ARTHROPLASTY;  Surgeon: Gaynelle Arabian, MD;  [facility];  Service: Orthopedics;  Laterality: Right;  Adductor Block   TOTAL KNEE ARTHROPLASTY Left 04/24/2020   Procedure: TOTAL KNEE ARTHROPLASTY;  Surgeon: Gaynelle Arabian, MD;  Location:  [facility];  Service: Orthopedics;  Laterality: Left;  56mn    There were no vitals filed for this visit.   Subjective Assessment - 02/13/21 1131     Subjective Patient has been dealing with some issues helping her daughter. She returns today for first return since eval. She says she is doing much better and has been practicing HEP. This has helped and knee does not feel locked up as much.    Pertinent History LT TKA    Limitations Other (comment)   sleeping   Patient Stated Goals Not feel tightness    Currently in Pain? No/denies    Pain Onset More than a month ago                [facility] - 02/13/21 0001       Observation/Other Assessments   Focus on Therapeutic Outcomes (FOTO)  79% function                           [facility] Adult PT Treatment/Exercise - 02/13/21 0001        Knee/Hip Exercises: Stretches   QSports administratorLeft;3 reps;30 seconds    Quad Stretch Limitations prone with strap    Other Knee/Hip Stretches supine calf stretch 5 x 10", hamstring stretch 5 x 10", AAROM heel slides with strap 5 x 10"                      PT Short Term Goals - 01/22/21 1416       PT SHORT TERM GOAL #1   Title Patient will be independent with initial HEP and self-management strategies to improve functional outcomes    Time 2    Period Weeks    Status New    Target Date 02/05/21               PT Long Term Goals - 01/22/21 1425       PT LONG TERM GOAL #1   Title Patient will improve FOTO score to predicted value to indicate improvement in functional outcomes    Time 4    Period Weeks    Status New    Target Date 02/19/21      PT LONG TERM GOAL #2   Title Patient will report at least 80% overall improvement in subjective complaint to indicate improvement in ability to perform ADLs.    Time 4    Period Weeks    Status New    Target Date 02/19/21      PT LONG TERM GOAL #3   Title Patient will have LT knee AROM 0-115 degrees to improve functional mobility and facilitate squatting to pick up items from floor and ambualting stair with alternating pattern.    Time 4    Period Weeks    Status New    Target Date 02/19/21                   Plan - 02/13/21 1150     Clinical Impression Statement Reviewed goals and HEP. Completed FOTO. Initiated ther ex. Activity limited per patient late arrival this date. Reviewed and performed HEP stretching. Added prone quad stretching. Patient tolerated well. Educated patient on purpose and function of all added activity. Patient will continue to benefit from skilled therapy services to reduce deficits and improve functional ability.    Examination-Activity Limitations Squat;Stairs;Sleep;Locomotion Level    Examination-Participation Restrictions Other;Yard Work;Community Activity  Stability/Clinical Decision Making Stable/Uncomplicated    Rehab Potential Good    PT Frequency 2x / week    PT Duration 4 weeks    PT Treatment/Interventions ADLs/Self Care Home Management;Aquatic Therapy;Biofeedback;Cryotherapy;Electrical Stimulation;Contrast Bath;Therapeutic exercise;Orthotic Fit/Training;Patient/family education;Manual lymph drainage;Compression bandaging;Energy conservation;Splinting;Taping;Vasopneumatic Device;Joint Manipulations;Spinal Manipulations;Manual techniques;Traction;Therapeutic activities;Functional mobility training;Ultrasound;Parrafin;Fluidtherapy;Neuromuscular re-education;Stair training;Gait training;Balance training;DME Instruction;Scar mobilization;Passive range of motion;Vestibular;Visual/perceptual remediation/compensation;Dry needling;Iontophoresis '4mg'$ /ml Dexamethasone;Moist Heat    PT Next Visit Plan Progress knee mobility with stretching and strenghthening as tolerated. Step ups/ downs, functional squat next visit    PT Home Exercise Plan Eval: calf stretch, hamstring stretch, AAROM heel slide 8/2 quad stretch    Consulted and Agree with Plan of Care Patient             Patient will benefit from skilled therapeutic intervention in order to improve the following deficits and impairments:  Pain, Increased fascial restricitons, Abnormal gait, Decreased mobility, Decreased activity tolerance, Decreased range of motion, Decreased strength, Impaired flexibility  Visit Diagnosis: Stiffness of left knee, not elsewhere classified  Left knee pain, unspecified chronicity     Problem List Patient Active Problem List   Diagnosis Date Noted   Primary osteoarthritis of right knee 04/24/2020   Endometrial polyp 03/19/2020   Fibroids, intramural 11/12/2019   Hypertension    Hypercholesteremia    History of kidney stones    Diabetes mellitus without complication (HCC)    Colon polyps    OA (osteoarthritis) of knee 10/14/2016   11:54 AM,  02/13/21 Josue Hector PT DPT  Physical Therapist with [facility]  [phone] [place], [place], [address] Phone: [phone]   Fax:  [phone]  Name: Amy Reyes MRN: [id number] Date of Birth: 04-02-1951

## 2021-02-13 NOTE — Patient Instructions (Signed)
Access Code: [email or web address] URL: [email or web address] Date: 02/13/2021 Prepared by: Josue Hector  Exercises Prone Quadriceps Stretch with Strap - 2-3 x daily - 7 x weekly - 1 sets - 10 reps - 5 second hold

## 2021-02-15 ENCOUNTER — Ambulatory Visit (HOSPITAL_COMMUNITY): Payer: Medicare HMO | Admitting: Physical Therapy

## 2021-02-15 ENCOUNTER — Encounter (HOSPITAL_COMMUNITY): Payer: Self-pay | Admitting: Physical Therapy

## 2021-02-15 ENCOUNTER — Other Ambulatory Visit: Payer: Self-pay

## 2021-02-15 DIAGNOSIS — M25662 Stiffness of left knee, not elsewhere classified: Secondary | ICD-10-CM

## 2021-02-15 DIAGNOSIS — M25562 Pain in left knee: Secondary | ICD-10-CM

## 2021-02-15 NOTE — Therapy (Signed)
[facility] [address] [place], [place], [address] Phone: [phone]   Fax:  [phone]  Physical Therapy Treatment  Patient Details  Name: Amy Reyes MRN: [id number] Date of Birth: 1951-04-24 Referring Provider (PT): Gaynelle Arabian MD   Encounter Date: 02/15/2021   PT End of Session - 02/15/21 1123     Visit Number 3    Number of Visits 8    Date for PT Re-Evaluation 02/19/21    Authorization Type [employer]    Authorization Time Period 8 visits 7/11-02/19/21    Authorization - Visit Number 3    Authorization - Number of Visits 8    PT Start Time 1118    PT Stop Time [id number]    PT Time Calculation (min) 40 min    Activity Tolerance Patient tolerated treatment well    Behavior During Therapy [facility] for tasks assessed/performed             Past Medical History:  Diagnosis Date   Allergy    seasonal allergies   Anemia    iron defiencey   Arthritis    oa   Bronchitis hx of   Chronic kidney disease    ckd stage 2- 3 due to NSAIDS use no nephrologist   Colon polyps    Complication of anesthesia    slow to wake up one time, none recent   Diabetes mellitus without complication (HCC)    borderline type 2 on metformin   GERD (gastroesophageal reflux disease)    on meds   History of carpal tunnel syndrome    years ago   History of kidney stones    History of rotator cuff tear    Bilateral   Hypercholesteremia    on meds   Hypertension    on meds   Peptic ulcer    PMB (postmenopausal bleeding)    last time middle of april 2021    Past Surgical History:  Procedure Laterality Date   ABDOMINOPLASTY  30 yrs ago   tummy tuck   BREAST SURGERY     reduction   CHOLECYSTECTOMY  2000   laparoscopic   COLONOSCOPY  2019   TA   COLONOSCOPY WITH PROPOFOL N/A 12/13/2020   Procedure: COLONOSCOPY WITH PROPOFOL;  Surgeon: Harvel Quale, MD;  [facility];  Service: Gastroenterology;  Laterality:  N/A;  11:00   DILATATION & CURETTAGE/HYSTEROSCOPY WITH MYOSURE N/A 12/20/2019   Procedure: DILATATION & CURETTAGE/HYSTEROSCOPY WITH MYOSURE RESECTION OF POLYP;  Surgeon: Megan Salon, MD;  Location: [facility];  Service: Gynecology;  Laterality: N/A;  resection of polyp,  to follow first case   INJECTION KNEE Left 10/14/2016   Procedure: CORTISONE LEFT KNEE INJECTION;  Surgeon: Gaynelle Arabian, MD;  Location: [facility];  Service: Orthopedics;  Laterality: Left;   LAPAROSCOPIC GASTRIC BANDING  2010 or 2011   left knee injection  6 weeks ago   POLYPECTOMY     TA   POLYPECTOMY  12/13/2020   Procedure: POLYPECTOMY;  Surgeon: Harvel Quale, MD;  [facility]: [facility];  Service: Gastroenterology;;  ascending colon   ROTATOR CUFF REPAIR Left 05/2019   TOTAL KNEE ARTHROPLASTY Right 10/14/2016   Procedure: RIGHT TOTAL KNEE ARTHROPLASTY;  Surgeon: Gaynelle Arabian, MD;  Location: [facility];  Service: Orthopedics;  Laterality: Right;  Adductor Block   TOTAL KNEE ARTHROPLASTY Left 04/24/2020   Procedure: TOTAL KNEE ARTHROPLASTY;  Surgeon: Gaynelle Arabian, MD;  Location: [facility];  Service: Orthopedics;  Laterality: Left;  77mn    There were no vitals filed for this visit.   Subjective Assessment - 02/15/21 1122     Subjective "About the same as the other day, no pain just sore"    Pertinent History LT TKA    Limitations Other (comment)   sleeping   Patient Stated Goals Not feel tightness    Currently in Pain? No/denies    Pain Onset More than a month ago                               [facility] 02/15/21 0001       Knee/Hip Exercises: Stretches   QSports administratorLeft;3 reps;30 seconds    Quad Stretch Limitations prone with strap    Other Knee/Hip Stretches supine calf stretch 5 x 10", hamstring stretch 5 x 10", AAROM heel slides with strap 5 x 10"      Knee/Hip Exercises: Standing   Functional Squat 1 set;15 reps      Knee/Hip  Exercises: Supine   Bridges 15 reps                      PT Short Term Goals - 01/22/21 1416       PT SHORT TERM GOAL #1   Title Patient will be independent with initial HEP and self-management strategies to improve functional outcomes    Time 2    Period Weeks    Status New    Target Date 02/05/21               PT Long Term Goals - 01/22/21 1425       PT LONG TERM GOAL #1   Title Patient will improve FOTO score to predicted value to indicate improvement in functional outcomes    Time 4    Period Weeks    Status New    Target Date 02/19/21      PT LONG TERM GOAL #2   Title Patient will report at least 80% overall improvement in subjective complaint to indicate improvement in ability to perform ADLs.    Time 4    Period Weeks    Status New    Target Date 02/19/21      PT LONG TERM GOAL #3   Title Patient will have LT knee AROM 0-115 degrees to improve functional mobility and facilitate squatting to pick up items from floor and ambualting stair with alternating pattern.    Time 4    Period Weeks    Status New    Target Date 02/19/21                   Plan - 02/15/21 1158     Clinical Impression Statement Patient progressing activity well overall. Remaining limited by ongoing quad flexibility restriction. Progressed LE strengthening activity with bridges and functional squats from mat. Patient educated on proper form and function of all added activity. Patient will continue to benefit from skilled therapy services to reduce deficits and improve functional ability.    Examination-Activity Limitations Squat;Stairs;Sleep;Locomotion Level    Examination-Participation Restrictions Other;Yard Work;Community Activity    Stability/Clinical Decision Making Stable/Uncomplicated    Rehab Potential Good    PT Frequency 2x / week    PT Duration 4 weeks    PT Treatment/Interventions ADLs/Self Care Home Management;Aquatic  Therapy;Biofeedback;Cryotherapy;Electrical Stimulation;Contrast Bath;Therapeutic exercise;Orthotic Fit/Training;Patient/family education;Manual lymph drainage;Compression bandaging;Energy  conservation;Splinting;Taping;Vasopneumatic Device;Joint Manipulations;Spinal Manipulations;Manual techniques;Traction;Therapeutic activities;Functional mobility training;Ultrasound;Parrafin;Fluidtherapy;Neuromuscular re-education;Stair training;Gait training;Balance training;DME Instruction;Scar mobilization;Passive range of motion;Vestibular;Visual/perceptual remediation/compensation;Dry needling;Iontophoresis '4mg'$ /ml Dexamethasone;Moist Heat    PT Next Visit Plan Progress knee mobility with stretching and strenghthening as tolerated. Step ups/ downs next visit    PT Home Exercise Plan Eval: calf stretch, hamstring stretch, AAROM heel slide 8/2 quad stretch    Consulted and Agree with Plan of Care Patient             Patient will benefit from skilled therapeutic intervention in order to improve the following deficits and impairments:  Pain, Increased fascial restricitons, Abnormal gait, Decreased mobility, Decreased activity tolerance, Decreased range of motion, Decreased strength, Impaired flexibility  Visit Diagnosis: Stiffness of left knee, not elsewhere classified  Left knee pain, unspecified chronicity     Problem List Patient Active Problem List   Diagnosis Date Noted   Primary osteoarthritis of right knee 04/24/2020   Endometrial polyp 03/19/2020   Fibroids, intramural 11/12/2019   Hypertension    Hypercholesteremia    History of kidney stones    Diabetes mellitus without complication (HCC)    Colon polyps    OA (osteoarthritis) of knee 10/14/2016   12:00 PM, 02/15/21 Josue Hector PT DPT  Physical Therapist with [facility]  [phone] [place], [place], [address] Phone: [phone]    Fax:  [phone]  Name: Amy Reyes MRN: [id number] Date of Birth: 07/21/1950

## 2021-02-20 ENCOUNTER — Ambulatory Visit (HOSPITAL_COMMUNITY): Payer: Medicare HMO | Admitting: Physical Therapy

## 2021-02-20 ENCOUNTER — Other Ambulatory Visit: Payer: Self-pay | Admitting: Internal Medicine

## 2021-02-20 DIAGNOSIS — Z1231 Encounter for screening mammogram for malignant neoplasm of breast: Secondary | ICD-10-CM

## 2021-02-22 ENCOUNTER — Telehealth (HOSPITAL_COMMUNITY): Payer: Self-pay | Admitting: Physical Therapy

## 2021-02-22 ENCOUNTER — Ambulatory Visit (HOSPITAL_COMMUNITY): Payer: Medicare HMO | Admitting: Physical Therapy

## 2021-02-22 NOTE — Telephone Encounter (Signed)
Her  grandson has a fever and it happens everytime he goes swimming. They will go to urgent care today. Patient requested to be put on hold for 2 weeks until she can figure this out. She will call us back to r/s or we can call her back in 2 weeks.

## 2021-02-27 ENCOUNTER — Encounter: Payer: Self-pay | Admitting: Gastroenterology

## 2021-02-27 NOTE — Progress Notes (Signed)
DUE TO COVID-19 ONLY ONE VISITOR IS ALLOWED TO COME WITH YOU AND STAY IN THE WAITING ROOM ONLY DURING PRE OP AND PROCEDURE DAY OF SURGERY. THE 1 VISITOR  MAY VISIT WITH YOU AFTER SURGERY IN YOUR PRIVATE ROOM DURING VISITING HOURS ONLY!  YOU NEED TO HAVE A COVID 19 TEST ON__8/30/2022 _____ '@_______'$ , THIS TEST MUST BE DONE BEFORE SURGERY, See map.                Amy Reyes  02/27/2021   Your procedure is scheduled on:  03/16/2021   Report to [facility] Main  Entrance   Report to admitting at     0900AM     Call this number if you have problems the morning of surgery [phone]           REMEMBER: FOLLOW ALL YOUR BOWEL PREP INSTRUCTIONS WITH CLEAR LIQUIDS FROM YOUR SURGEON'S INSTRUCTIONS. DRINK 2 PRESURGERY ENSURE DRINKS THE NIGHT BEFORE SURGERY AT 1000 PM AND 1 PRESURGERY DRINK THE DAY OF THE PROCEDURE 3 HOURS PRIOR TO SCHEDULED SURGERY.  NOTHING BY MOUTH EXCEPT CLEAR LIQUIDS UNTIL THREE HOURS PRIOR TO SCHEDULED SURGERY. PLEASE FINISH PRESURGERY 3RD  ENSURE  DRINK PER SURGEON ORDER 3 HOURS PRIOR TO SCHEDULED SURGERY TIME WHICH NEEDS TO BE COMPLETED AT ____  0800am _____.     CLEAR LIQUID DIET   Foods Allowed                                                                      Coffee and tea, regular and decaf                           Plain Jell-O any favor except red or purple                                         Fruit ices (not with fruit pulp)                                      Iced Popsicles                                     Carbonated beverages, regular and diet                                    Cranberry, grape and apple juices Sports drinks like Gatorade Lightly seasoned clear broth or consume(fat free) Sugar, honey syrup  _____________________________________________________________________      BRUSH YOUR TEETH MORNING OF SURGERY AND RINSE YOUR MOUTH OUT, NO CHEWING GUM CANDY OR MINTS.     Take these medicines the morning of surgery with A  SIP OF WATER:   Inhalers   DO NOT TAKE ANY DIABETIC MEDICATIONS DAY OF YOUR SURGERY  You may not have any metal on your body including hair pins and              piercings  Do not wear jewelry, make-up, lotions, powders or perfumes, deodorant             Do not wear nail polish on your fingernails.  Do not shave  48 hours prior to surgery.              Men may shave face and neck.   Do not bring valuables to the hospital. [place].  Contacts, dentures or bridgework may not be worn into surgery.  Leave suitcase in the car. After surgery it may be brought to your room.                 Please read over the following fact sheets you were given: _____________________________________________________________________  [facility] - Preparing for Surgery Before surgery, you can play an important role.  Because skin is not sterile, your skin needs to be as free of germs as possible.  You can reduce the number of germs on your skin by washing with CHG (chlorahexidine gluconate) soap before surgery.  CHG is an antiseptic cleaner which kills germs and bonds with the skin to continue killing germs even after washing. Please DO NOT use if you have an allergy to CHG or antibacterial soaps.  If your skin becomes reddened/irritated stop using the CHG and inform your nurse when you arrive at Short Stay. Do not shave (including legs and underarms) for at least 48 hours prior to the first CHG shower.  You may shave your face/neck. Please follow these instructions carefully:  1.  Shower with CHG Soap the night before surgery and the  morning of Surgery.  2.  If you choose to wash your hair, wash your hair first as usual with your  normal  shampoo.  3.  After you shampoo, rinse your hair and body thoroughly to remove the  shampoo.                           4.  Use CHG as you would any other liquid soap.  You can apply chg directly  to  the skin and wash                       Gently with a scrungie or clean washcloth.  5.  Apply the CHG Soap to your body ONLY FROM THE NECK DOWN.   Do not use on face/ open                           Wound or open sores. Avoid contact with eyes, ears mouth and genitals (private parts).                       Wash face,  Genitals (private parts) with your normal soap.             6.  Wash thoroughly, paying special attention to the area where your surgery  will be performed.  7.  Thoroughly rinse your body with warm water from the neck down.  8.  DO NOT shower/wash with your normal soap after using and rinsing off  the CHG Soap.  9.  Pat yourself dry with a clean towel.            10.  Wear clean pajamas.            11.  Place clean sheets on your bed the night of your first shower and do not  sleep with pets. Day of Surgery : Do not apply any lotions/deodorants the morning of surgery.  Please wear clean clothes to the hospital/surgery center.  FAILURE TO FOLLOW THESE INSTRUCTIONS MAY RESULT IN THE CANCELLATION OF YOUR SURGERY PATIENT SIGNATURE_________________________________  NURSE SIGNATURE__________________________________  ________________________________________________________________________

## 2021-02-28 ENCOUNTER — Other Ambulatory Visit (HOSPITAL_COMMUNITY): Payer: Self-pay

## 2021-03-01 ENCOUNTER — Other Ambulatory Visit: Payer: Self-pay

## 2021-03-01 ENCOUNTER — Encounter (HOSPITAL_COMMUNITY): Payer: Self-pay

## 2021-03-01 ENCOUNTER — Encounter (HOSPITAL_COMMUNITY)
Admission: RE | Admit: 2021-03-01 | Discharge: 2021-03-01 | Disposition: A | Discharge status: Home / Self Care | Payer: Medicare HMO | Source: Ambulatory Visit | Attending: Surgery | Admitting: Surgery

## 2021-03-01 DIAGNOSIS — Z01818 Encounter for other preprocedural examination: Secondary | ICD-10-CM | POA: Diagnosis not present

## 2021-03-01 LAB — HEMOGLOBIN A1C
Hgb A1c MFr Bld: 6.2 % — ABNORMAL HIGH (ref 4.8–5.6)
Mean Plasma Glucose: 131.24 mg/dL

## 2021-03-01 LAB — CBC
HCT: 36.1 % (ref 36.0–46.0)
Hemoglobin: 11.1 g/dL — ABNORMAL LOW (ref 12.0–15.0)
MCH: 26.2 pg (ref 26.0–34.0)
MCHC: 30.7 g/dL (ref 30.0–36.0)
MCV: 85.3 fL (ref 80.0–100.0)
Platelets: 257 10*3/uL (ref 150–400)
RBC: 4.23 MIL/uL (ref 3.87–5.11)
RDW: 15.2 % (ref 11.5–15.5)
WBC: 6.9 10*3/uL (ref 4.0–10.5)
nRBC: 0 % (ref 0.0–0.2)

## 2021-03-01 LAB — BASIC METABOLIC PANEL
Anion gap: 9 (ref 5–15)
BUN: 15 mg/dL (ref 8–23)
CO2: 25 mmol/L (ref 22–32)
Calcium: 10.5 mg/dL — ABNORMAL HIGH (ref 8.9–10.3)
Chloride: 104 mmol/L (ref 98–111)
Creatinine, Ser: 1.07 mg/dL — ABNORMAL HIGH (ref 0.44–1.00)
GFR, Estimated: 56 mL/min — ABNORMAL LOW (ref >60)
Glucose, Bld: 89 mg/dL (ref 70–99)
Potassium: 4.7 mmol/L (ref 3.5–5.1)
Sodium: 138 mmol/L (ref 135–145)

## 2021-03-01 LAB — GLUCOSE, CAPILLARY: Glucose-Capillary: 91 mg/dL (ref 70–99)

## 2021-03-01 NOTE — Progress Notes (Addendum)
Anesthesia Review:  PCP: DR Tisovec LVMM and requested LOV note.  Cardiologist : Chest x-ray : EKG :03/01/2021  Echo : Stress test: Cardiac Cath :  Activity level: can do a flgiht of stairs without difficulty  Sleep Study/ CPAP : none  Fasting Blood Sugar :      / Checks Blood Sugar -- times a day:   Blood Thinner/ Instructions /Last Dose: ASA / Instructions/ Last Dose :   DM- type 2 ? Checks glucose occasionally per pt  Hgba1c- 03/01/21 - 6.2  Covdi test on 03/13/21

## 2021-03-02 ENCOUNTER — Ambulatory Visit
Admission: RE | Admit: 2021-03-02 | Discharge: 2021-03-02 | Disposition: A | Discharge status: Home / Self Care | Payer: Medicare HMO | Source: Ambulatory Visit | Attending: Internal Medicine | Admitting: Internal Medicine

## 2021-03-02 DIAGNOSIS — Z1231 Encounter for screening mammogram for malignant neoplasm of breast: Secondary | ICD-10-CM

## 2021-03-08 ENCOUNTER — Other Ambulatory Visit: Payer: Self-pay | Admitting: Internal Medicine

## 2021-03-08 DIAGNOSIS — R928 Other abnormal and inconclusive findings on diagnostic imaging of breast: Secondary | ICD-10-CM

## 2021-03-13 ENCOUNTER — Other Ambulatory Visit: Payer: Self-pay | Admitting: Surgery

## 2021-03-14 LAB — SARS CORONAVIRUS 2 (TAT 6-24 HRS): SARS Coronavirus 2: NEGATIVE

## 2021-03-15 ENCOUNTER — Other Ambulatory Visit (HOSPITAL_COMMUNITY): Payer: Self-pay

## 2021-03-15 MED ORDER — SODIUM CHLORIDE 0.9 % IV SOLN
2.0000 g | INTRAVENOUS | Status: AC
  Administered 2021-03-16: 2 g via INTRAVENOUS
  Filled 2021-03-15: qty 2

## 2021-03-16 ENCOUNTER — Encounter (HOSPITAL_COMMUNITY): Payer: Self-pay | Admitting: Surgery

## 2021-03-16 ENCOUNTER — Encounter (HOSPITAL_COMMUNITY)
Admission: RE | Disposition: A | Discharge status: Home / Self Care | Payer: Self-pay | Source: Ambulatory Visit | Attending: Surgery

## 2021-03-16 ENCOUNTER — Inpatient Hospital Stay (HOSPITAL_COMMUNITY): Payer: Medicare HMO | Admitting: Anesthesiology

## 2021-03-16 ENCOUNTER — Other Ambulatory Visit: Payer: Self-pay

## 2021-03-16 ENCOUNTER — Inpatient Hospital Stay (HOSPITAL_COMMUNITY)
Admission: RE | Admit: 2021-03-16 | Discharge: 2021-03-18 | DRG: 328 | Disposition: A | Discharge status: Home / Self Care | Payer: Medicare HMO | Source: Ambulatory Visit | Attending: Surgery | Admitting: Surgery

## 2021-03-16 ENCOUNTER — Inpatient Hospital Stay (HOSPITAL_COMMUNITY): Payer: Medicare HMO | Admitting: Emergency Medicine

## 2021-03-16 DIAGNOSIS — Z4651 Encounter for fitting and adjustment of gastric lap band: Secondary | ICD-10-CM | POA: Diagnosis not present

## 2021-03-16 DIAGNOSIS — D122 Benign neoplasm of ascending colon: Secondary | ICD-10-CM | POA: Diagnosis not present

## 2021-03-16 DIAGNOSIS — D120 Benign neoplasm of cecum: Principal | ICD-10-CM | POA: Diagnosis present

## 2021-03-16 DIAGNOSIS — K219 Gastro-esophageal reflux disease without esophagitis: Secondary | ICD-10-CM | POA: Diagnosis present

## 2021-03-16 DIAGNOSIS — K6389 Other specified diseases of intestine: Secondary | ICD-10-CM | POA: Diagnosis present

## 2021-03-16 DIAGNOSIS — D1339 Benign neoplasm of other parts of small intestine: Secondary | ICD-10-CM | POA: Diagnosis not present

## 2021-03-16 DIAGNOSIS — Z96653 Presence of artificial knee joint, bilateral: Secondary | ICD-10-CM | POA: Diagnosis present

## 2021-03-16 DIAGNOSIS — Z20822 Contact with and (suspected) exposure to covid-19: Secondary | ICD-10-CM | POA: Diagnosis not present

## 2021-03-16 DIAGNOSIS — Z79899 Other long term (current) drug therapy: Secondary | ICD-10-CM

## 2021-03-16 DIAGNOSIS — I10 Essential (primary) hypertension: Secondary | ICD-10-CM | POA: Diagnosis not present

## 2021-03-16 DIAGNOSIS — K660 Peritoneal adhesions (postprocedural) (postinfection): Secondary | ICD-10-CM | POA: Diagnosis not present

## 2021-03-16 DIAGNOSIS — T85898A Other specified complication of other internal prosthetic devices, implants and grafts, initial encounter: Secondary | ICD-10-CM | POA: Diagnosis not present

## 2021-03-16 DIAGNOSIS — Z79818 Long term (current) use of other agents affecting estrogen receptors and estrogen levels: Secondary | ICD-10-CM | POA: Diagnosis not present

## 2021-03-16 DIAGNOSIS — E119 Type 2 diabetes mellitus without complications: Secondary | ICD-10-CM | POA: Diagnosis present

## 2021-03-16 DIAGNOSIS — Z800 Family history of malignant neoplasm of digestive organs: Secondary | ICD-10-CM | POA: Diagnosis not present

## 2021-03-16 DIAGNOSIS — E669 Obesity, unspecified: Secondary | ICD-10-CM | POA: Diagnosis present

## 2021-03-16 DIAGNOSIS — Z6833 Body mass index (BMI) 33.0-33.9, adult: Secondary | ICD-10-CM | POA: Diagnosis not present

## 2021-03-16 DIAGNOSIS — Z87891 Personal history of nicotine dependence: Secondary | ICD-10-CM | POA: Diagnosis not present

## 2021-03-16 DIAGNOSIS — Z9884 Bariatric surgery status: Secondary | ICD-10-CM

## 2021-03-16 DIAGNOSIS — D121 Benign neoplasm of appendix: Secondary | ICD-10-CM | POA: Diagnosis not present

## 2021-03-16 DIAGNOSIS — E7800 Pure hypercholesterolemia, unspecified: Secondary | ICD-10-CM | POA: Diagnosis not present

## 2021-03-16 HISTORY — PX: XI ROBOTIC LAPAROSCOPIC ASSISTED APPENDECTOMY: SHX6877

## 2021-03-16 LAB — GLUCOSE, CAPILLARY
Glucose-Capillary: 95 mg/dL (ref 70–99)
Glucose-Capillary: 152 mg/dL — ABNORMAL HIGH (ref 70–99)

## 2021-03-16 SURGERY — COLECTOMY, PARTIAL, ROBOT-ASSISTED, LAPAROSCOPIC
Anesthesia: General

## 2021-03-16 MED ORDER — ONDANSETRON HCL 4 MG/2ML IJ SOLN: 4.0000 mg | Freq: Four times a day (QID) | INTRAMUSCULAR | Status: DC | PRN

## 2021-03-16 MED ORDER — LOSARTAN POTASSIUM-HCTZ 100-25 MG PO TABS: 1.0000 | ORAL_TABLET | Freq: Every morning | ORAL | Status: DC

## 2021-03-16 MED ORDER — DEXAMETHASONE SODIUM PHOSPHATE 10 MG/ML IJ SOLN
INTRAMUSCULAR | Status: AC
  Filled 2021-03-16: qty 1

## 2021-03-16 MED ORDER — BUPIVACAINE-EPINEPHRINE (PF) 0.25% -1:200000 IJ SOLN
INTRAMUSCULAR | Status: DC | PRN
  Administered 2021-03-16: 50 mL

## 2021-03-16 MED ORDER — METOPROLOL TARTRATE 5 MG/5ML IV SOLN: 5.0000 mg | Freq: Four times a day (QID) | INTRAVENOUS | Status: DC | PRN

## 2021-03-16 MED ORDER — LIDOCAINE 2% (20 MG/ML) 5 ML SYRINGE
INTRAMUSCULAR | Status: DC | PRN
  Administered 2021-03-16: 60 mg via INTRAVENOUS

## 2021-03-16 MED ORDER — FENTANYL CITRATE PF 50 MCG/ML IJ SOSY: 25.0000 ug | PREFILLED_SYRINGE | INTRAMUSCULAR | Status: DC | PRN

## 2021-03-16 MED ORDER — SODIUM CHLORIDE 0.9% FLUSH: 3.0000 mL | INTRAVENOUS | Status: DC | PRN

## 2021-03-16 MED ORDER — ATROPINE SULFATE 0.4 MG/ML IV SOSY
PREFILLED_SYRINGE | INTRAVENOUS | Status: DC | PRN
  Administered 2021-03-16: .4 mg via INTRAVENOUS

## 2021-03-16 MED ORDER — EPHEDRINE 5 MG/ML INJ
INTRAVENOUS | Status: AC
  Filled 2021-03-16: qty 5

## 2021-03-16 MED ORDER — FENTANYL CITRATE (PF) 250 MCG/5ML IJ SOLN
INTRAMUSCULAR | Status: DC | PRN
  Administered 2021-03-16: 50 ug via INTRAVENOUS
  Administered 2021-03-16: 100 ug via INTRAVENOUS
  Administered 2021-03-16 (×2): 50 ug via INTRAVENOUS

## 2021-03-16 MED ORDER — KETOROLAC TROMETHAMINE 30 MG/ML IJ SOLN
INTRAMUSCULAR | Status: DC | PRN
  Administered 2021-03-16: 15 mg via INTRAVENOUS

## 2021-03-16 MED ORDER — ROCURONIUM BROMIDE 10 MG/ML (PF) SYRINGE
PREFILLED_SYRINGE | INTRAVENOUS | Status: DC | PRN
Start: 2021-03-16 — End: 2021-03-16
  Administered 2021-03-16: 60 mg via INTRAVENOUS
  Administered 2021-03-16 (×2): 20 mg via INTRAVENOUS

## 2021-03-16 MED ORDER — BUPIVACAINE LIPOSOME 1.3 % IJ SUSP
INTRAMUSCULAR | Status: DC | PRN
  Administered 2021-03-16: 20 mL

## 2021-03-16 MED ORDER — ACETAMINOPHEN 500 MG PO TABS
1000.0000 mg | ORAL_TABLET | Freq: Four times a day (QID) | ORAL | Status: DC
  Administered 2021-03-16 – 2021-03-18 (×6): 1000 mg via ORAL
  Filled 2021-03-16 (×7): qty 2

## 2021-03-16 MED ORDER — SODIUM CHLORIDE 0.9 % IV SOLN
2.0000 g | Freq: Two times a day (BID) | INTRAVENOUS | Status: AC
  Administered 2021-03-16: 2 g via INTRAVENOUS
  Filled 2021-03-16 (×2): qty 2

## 2021-03-16 MED ORDER — LACTATED RINGERS IV SOLN: INTRAVENOUS | Status: DC

## 2021-03-16 MED ORDER — ENSURE PRE-SURGERY PO LIQD
592.0000 mL | Freq: Once | ORAL | Status: DC
  Filled 2021-03-16: qty 592

## 2021-03-16 MED ORDER — NEOMYCIN SULFATE 500 MG PO TABS
1000.0000 mg | ORAL_TABLET | ORAL | Status: DC
  Filled 2021-03-16: qty 2

## 2021-03-16 MED ORDER — SODIUM CHLORIDE (PF) 0.9 % IJ SOLN
INTRAMUSCULAR | Status: AC
  Filled 2021-03-16: qty 20

## 2021-03-16 MED ORDER — SIMETHICONE 80 MG PO CHEW: 40.0000 mg | CHEWABLE_TABLET | Freq: Four times a day (QID) | ORAL | Status: DC | PRN

## 2021-03-16 MED ORDER — EPHEDRINE SULFATE-NACL 50-0.9 MG/10ML-% IV SOSY
PREFILLED_SYRINGE | INTRAVENOUS | Status: DC | PRN
  Administered 2021-03-16: 5 mg via INTRAVENOUS

## 2021-03-16 MED ORDER — ACETAMINOPHEN 500 MG PO TABS
1000.0000 mg | ORAL_TABLET | ORAL | Status: AC
  Administered 2021-03-16: 1000 mg via ORAL
  Filled 2021-03-16: qty 2

## 2021-03-16 MED ORDER — PROPOFOL 10 MG/ML IV BOLUS
INTRAVENOUS | Status: DC | PRN
  Administered 2021-03-16: 150 mg via INTRAVENOUS

## 2021-03-16 MED ORDER — MAGIC MOUTHWASH
15.0000 mL | Freq: Four times a day (QID) | ORAL | Status: DC | PRN
  Filled 2021-03-16: qty 15

## 2021-03-16 MED ORDER — FENTANYL CITRATE PF 50 MCG/ML IJ SOSY
PREFILLED_SYRINGE | INTRAMUSCULAR | Status: AC
  Administered 2021-03-16: 25 ug via INTRAVENOUS
  Filled 2021-03-16: qty 1

## 2021-03-16 MED ORDER — ORAL CARE MOUTH RINSE: 15.0000 mL | Freq: Once | OROMUCOSAL | Status: AC

## 2021-03-16 MED ORDER — METRONIDAZOLE 500 MG PO TABS
1000.0000 mg | ORAL_TABLET | ORAL | Status: DC
  Filled 2021-03-16: qty 2

## 2021-03-16 MED ORDER — OXYCODONE HCL 5 MG PO TABS
5.0000 mg | ORAL_TABLET | ORAL | Status: DC | PRN
  Administered 2021-03-16 – 2021-03-17 (×3): 5 mg via ORAL
  Filled 2021-03-16 (×3): qty 1
  Filled 2021-03-16: qty 2

## 2021-03-16 MED ORDER — ALUM & MAG HYDROXIDE-SIMETH 200-200-20 MG/5ML PO SUSP: 30.0000 mL | Freq: Four times a day (QID) | ORAL | Status: DC | PRN

## 2021-03-16 MED ORDER — SODIUM CHLORIDE 0.9 % IV SOLN: 250.0000 mL | INTRAVENOUS | Status: DC | PRN

## 2021-03-16 MED ORDER — ALVIMOPAN 12 MG PO CAPS
12.0000 mg | ORAL_CAPSULE | ORAL | Status: AC
  Administered 2021-03-16: 12 mg via ORAL
  Filled 2021-03-16: qty 1

## 2021-03-16 MED ORDER — 0.9 % SODIUM CHLORIDE (POUR BTL) OPTIME
TOPICAL | Status: DC | PRN
  Administered 2021-03-16: 2000 mL

## 2021-03-16 MED ORDER — SUGAMMADEX SODIUM 200 MG/2ML IV SOLN
INTRAVENOUS | Status: DC | PRN
  Administered 2021-03-16: 200 mg via INTRAVENOUS

## 2021-03-16 MED ORDER — FENTANYL CITRATE (PF) 250 MCG/5ML IJ SOLN
INTRAMUSCULAR | Status: AC
  Filled 2021-03-16: qty 5

## 2021-03-16 MED ORDER — MIDAZOLAM HCL 2 MG/2ML IJ SOLN
INTRAMUSCULAR | Status: DC | PRN
  Administered 2021-03-16: 2 mg via INTRAVENOUS

## 2021-03-16 MED ORDER — ENALAPRILAT 1.25 MG/ML IV SOLN
0.6250 mg | Freq: Four times a day (QID) | INTRAVENOUS | Status: DC | PRN
  Filled 2021-03-16: qty 1

## 2021-03-16 MED ORDER — BUPIVACAINE LIPOSOME 1.3 % IJ SUSP: 20.0000 mL | Freq: Once | INTRAMUSCULAR | Status: DC

## 2021-03-16 MED ORDER — DEXAMETHASONE SODIUM PHOSPHATE 10 MG/ML IJ SOLN
INTRAMUSCULAR | Status: DC | PRN
  Administered 2021-03-16: 5 mg via INTRAVENOUS

## 2021-03-16 MED ORDER — POLYETHYLENE GLYCOL 3350 17 GM/SCOOP PO POWD: 1.0000 | Freq: Once | ORAL | Status: DC

## 2021-03-16 MED ORDER — ONDANSETRON HCL 4 MG/2ML IJ SOLN
INTRAMUSCULAR | Status: DC | PRN
  Administered 2021-03-16: 4 mg via INTRAVENOUS

## 2021-03-16 MED ORDER — GLYCOPYRROLATE 0.2 MG/ML IJ SOLN
INTRAMUSCULAR | Status: AC
  Filled 2021-03-16: qty 1

## 2021-03-16 MED ORDER — ROCURONIUM BROMIDE 10 MG/ML (PF) SYRINGE
PREFILLED_SYRINGE | INTRAVENOUS | Status: AC
  Filled 2021-03-16: qty 10

## 2021-03-16 MED ORDER — ATORVASTATIN CALCIUM 40 MG PO TABS
80.0000 mg | ORAL_TABLET | Freq: Every morning | ORAL | Status: DC
  Administered 2021-03-18: 80 mg via ORAL
  Filled 2021-03-16: qty 2

## 2021-03-16 MED ORDER — KETOROLAC TROMETHAMINE 15 MG/ML IJ SOLN: 15.0000 mg | Freq: Once | INTRAMUSCULAR | Status: AC | PRN

## 2021-03-16 MED ORDER — ENSURE SURGERY PO LIQD: 237.0000 mL | Freq: Two times a day (BID) | ORAL | Status: DC

## 2021-03-16 MED ORDER — PROCHLORPERAZINE EDISYLATE 10 MG/2ML IJ SOLN
5.0000 mg | Freq: Four times a day (QID) | INTRAMUSCULAR | Status: DC | PRN
Start: 2021-03-16 — End: 2021-03-18

## 2021-03-16 MED ORDER — PROPOFOL 10 MG/ML IV BOLUS
INTRAVENOUS | Status: AC
  Filled 2021-03-16: qty 20

## 2021-03-16 MED ORDER — PROCHLORPERAZINE MALEATE 10 MG PO TABS
10.0000 mg | ORAL_TABLET | Freq: Four times a day (QID) | ORAL | Status: DC | PRN
  Filled 2021-03-16: qty 1

## 2021-03-16 MED ORDER — AMISULPRIDE (ANTIEMETIC) 5 MG/2ML IV SOLN: 10.0000 mg | Freq: Once | INTRAVENOUS | Status: DC | PRN

## 2021-03-16 MED ORDER — DIPHENHYDRAMINE HCL 50 MG/ML IJ SOLN: 12.5000 mg | Freq: Four times a day (QID) | INTRAMUSCULAR | Status: DC | PRN

## 2021-03-16 MED ORDER — HYDROMORPHONE HCL 1 MG/ML IJ SOLN: 0.5000 mg | INTRAMUSCULAR | Status: DC | PRN

## 2021-03-16 MED ORDER — ASCORBIC ACID 500 MG PO TABS
750.0000 mg | ORAL_TABLET | Freq: Every morning | ORAL | Status: DC
  Administered 2021-03-18: 750 mg via ORAL
  Filled 2021-03-16: qty 2

## 2021-03-16 MED ORDER — KETOROLAC TROMETHAMINE 15 MG/ML IJ SOLN
INTRAMUSCULAR | Status: AC
  Administered 2021-03-16: 15 mg via INTRAVENOUS
  Filled 2021-03-16: qty 1

## 2021-03-16 MED ORDER — ENSURE PRE-SURGERY PO LIQD
296.0000 mL | Freq: Once | ORAL | Status: DC
  Filled 2021-03-16: qty 296

## 2021-03-16 MED ORDER — LIDOCAINE 2% (20 MG/ML) 5 ML SYRINGE
INTRAMUSCULAR | Status: AC
  Filled 2021-03-16: qty 5

## 2021-03-16 MED ORDER — GABAPENTIN 300 MG PO CAPS
300.0000 mg | ORAL_CAPSULE | ORAL | Status: AC
  Administered 2021-03-16: 300 mg via ORAL
  Filled 2021-03-16: qty 1

## 2021-03-16 MED ORDER — ENOXAPARIN SODIUM 40 MG/0.4ML IJ SOSY
40.0000 mg | PREFILLED_SYRINGE | INTRAMUSCULAR | Status: DC
  Administered 2021-03-17: 40 mg via SUBCUTANEOUS
  Filled 2021-03-16: qty 0.4

## 2021-03-16 MED ORDER — DIPHENHYDRAMINE HCL 12.5 MG/5ML PO ELIX: 12.5000 mg | ORAL_SOLUTION | Freq: Four times a day (QID) | ORAL | Status: DC | PRN

## 2021-03-16 MED ORDER — BUPIVACAINE-EPINEPHRINE 0.25% -1:200000 IJ SOLN
INTRAMUSCULAR | Status: AC
  Filled 2021-03-16: qty 1

## 2021-03-16 MED ORDER — LACTATED RINGERS IV BOLUS: 1000.0000 mL | Freq: Three times a day (TID) | INTRAVENOUS | Status: DC | PRN

## 2021-03-16 MED ORDER — GLYCOPYRROLATE 0.2 MG/ML IJ SOLN
INTRAMUSCULAR | Status: DC | PRN
  Administered 2021-03-16: .4 mg via INTRAVENOUS

## 2021-03-16 MED ORDER — LOSARTAN POTASSIUM 50 MG PO TABS
50.0000 mg | ORAL_TABLET | Freq: Every morning | ORAL | Status: DC
  Administered 2021-03-18: 50 mg via ORAL
  Filled 2021-03-16: qty 1

## 2021-03-16 MED ORDER — ONDANSETRON HCL 4 MG PO TABS: 4.0000 mg | ORAL_TABLET | Freq: Four times a day (QID) | ORAL | Status: DC | PRN

## 2021-03-16 MED ORDER — ENOXAPARIN SODIUM 40 MG/0.4ML IJ SOSY
40.0000 mg | PREFILLED_SYRINGE | Freq: Once | INTRAMUSCULAR | Status: AC
  Administered 2021-03-16: 40 mg via SUBCUTANEOUS
  Filled 2021-03-16: qty 0.4

## 2021-03-16 MED ORDER — CHLORHEXIDINE GLUCONATE 0.12 % MT SOLN
15.0000 mL | Freq: Once | OROMUCOSAL | Status: AC
  Administered 2021-03-16: 15 mL via OROMUCOSAL

## 2021-03-16 MED ORDER — ALVIMOPAN 12 MG PO CAPS
12.0000 mg | ORAL_CAPSULE | Freq: Two times a day (BID) | ORAL | Status: DC
  Filled 2021-03-16 (×2): qty 1

## 2021-03-16 MED ORDER — SODIUM CHLORIDE 0.9% FLUSH
3.0000 mL | Freq: Two times a day (BID) | INTRAVENOUS | Status: DC
  Administered 2021-03-16 – 2021-03-17 (×3): 3 mL via INTRAVENOUS

## 2021-03-16 MED ORDER — ALBUTEROL SULFATE (2.5 MG/3ML) 0.083% IN NEBU: 3.0000 mL | INHALATION_SOLUTION | Freq: Four times a day (QID) | RESPIRATORY_TRACT | Status: DC | PRN

## 2021-03-16 MED ORDER — ONDANSETRON HCL 4 MG/2ML IJ SOLN
INTRAMUSCULAR | Status: AC
  Filled 2021-03-16: qty 2

## 2021-03-16 MED ORDER — LIP MEDEX EX OINT
1.0000 "application " | TOPICAL_OINTMENT | Freq: Two times a day (BID) | CUTANEOUS | Status: DC
  Administered 2021-03-16 – 2021-03-18 (×4): 1 via TOPICAL
  Filled 2021-03-16: qty 7

## 2021-03-16 MED ORDER — MIDAZOLAM HCL 2 MG/2ML IJ SOLN
INTRAMUSCULAR | Status: AC
  Filled 2021-03-16: qty 2

## 2021-03-16 MED ORDER — ONDANSETRON HCL 4 MG/2ML IJ SOLN: 4.0000 mg | Freq: Once | INTRAMUSCULAR | Status: DC | PRN

## 2021-03-16 MED ORDER — KETAMINE HCL 10 MG/ML IJ SOLN
INTRAMUSCULAR | Status: DC | PRN
  Administered 2021-03-16: 30 mg via INTRAVENOUS

## 2021-03-16 MED ORDER — BISACODYL 5 MG PO TBEC: 20.0000 mg | DELAYED_RELEASE_TABLET | Freq: Once | ORAL | Status: DC

## 2021-03-16 MED ORDER — MELATONIN 3 MG PO TABS: 3.0000 mg | ORAL_TABLET | Freq: Every evening | ORAL | Status: DC | PRN

## 2021-03-16 MED ORDER — VITAMIN D 25 MCG (1000 UNIT) PO TABS
6000.0000 [IU] | ORAL_TABLET | Freq: Every morning | ORAL | Status: DC
  Administered 2021-03-18: 6000 [IU] via ORAL
  Filled 2021-03-16: qty 6

## 2021-03-16 MED ORDER — BUPIVACAINE LIPOSOME 1.3 % IJ SUSP
INTRAMUSCULAR | Status: AC
  Filled 2021-03-16: qty 10

## 2021-03-16 MED ORDER — HYDROCHLOROTHIAZIDE 12.5 MG PO CAPS
12.5000 mg | ORAL_CAPSULE | Freq: Every morning | ORAL | Status: DC
  Administered 2021-03-18: 12.5 mg via ORAL
  Filled 2021-03-16: qty 1

## 2021-03-16 SURGICAL SUPPLY — 108 items
APPLIER CLIP 5 13 M/L LIGAMAX5 (MISCELLANEOUS)
APPLIER CLIP ROT 10 11.4 M/L (STAPLE)
BAG COUNTER SPONGE SURGICOUNT (BAG) ×2 IMPLANT
BLADE EXTENDED COATED 6.5IN (ELECTRODE) IMPLANT
CANNULA REDUC XI 12-8 STAPL (CANNULA) ×1
CANNULA REDUCER 12-8 DVNC XI (CANNULA) ×1 IMPLANT
CELLS DAT CNTRL 66122 CELL SVR (MISCELLANEOUS) IMPLANT
CHLORAPREP W/TINT 26 (MISCELLANEOUS) IMPLANT
CLIP APPLIE 5 13 M/L LIGAMAX5 (MISCELLANEOUS) IMPLANT
CLIP APPLIE ROT 10 11.4 M/L (STAPLE) IMPLANT
COVER SURGICAL LIGHT HANDLE (MISCELLANEOUS) ×4 IMPLANT
COVER TIP SHEARS 8 DVNC (MISCELLANEOUS) ×1 IMPLANT
COVER TIP SHEARS 8MM DA VINCI (MISCELLANEOUS) ×1
DECANTER SPIKE VIAL GLASS SM (MISCELLANEOUS) ×2 IMPLANT
DEVICE TROCAR PUNCTURE CLOSURE (ENDOMECHANICALS) IMPLANT
DRAIN CHANNEL 19F RND (DRAIN) IMPLANT
DRAPE ARM DVNC X/XI (DISPOSABLE) ×4 IMPLANT
DRAPE COLUMN DVNC XI (DISPOSABLE) ×1 IMPLANT
DRAPE DA VINCI XI ARM (DISPOSABLE) ×4
DRAPE DA VINCI XI COLUMN (DISPOSABLE) ×1
DRAPE SURG IRRIG POUCH 19X23 (DRAPES) ×2 IMPLANT
DRSG OPSITE POSTOP 4X10 (GAUZE/BANDAGES/DRESSINGS) IMPLANT
DRSG OPSITE POSTOP 4X6 (GAUZE/BANDAGES/DRESSINGS) IMPLANT
DRSG OPSITE POSTOP 4X8 (GAUZE/BANDAGES/DRESSINGS) IMPLANT
DRSG TEGADERM 2-3/8X2-3/4 SM (GAUZE/BANDAGES/DRESSINGS) ×2 IMPLANT
DRSG TEGADERM 4X4.75 (GAUZE/BANDAGES/DRESSINGS) IMPLANT
ELECT PENCIL ROCKER SW 15FT (MISCELLANEOUS) ×2 IMPLANT
ELECT REM PT RETURN 15FT ADLT (MISCELLANEOUS) ×2 IMPLANT
ENDOLOOP SUT PDS II  0 18 (SUTURE)
ENDOLOOP SUT PDS II 0 18 (SUTURE) IMPLANT
EVACUATOR SILICONE 100CC (DRAIN) IMPLANT
GAUZE SPONGE 2X2 8PLY STRL LF (GAUZE/BANDAGES/DRESSINGS) ×1 IMPLANT
GLOVE SURG NEOPR MICRO LF SZ8 (GLOVE) ×6 IMPLANT
GLOVE SURG UNDER LTX SZ8 (GLOVE) ×6 IMPLANT
GOWN STRL REUS W/TWL XL LVL3 (GOWN DISPOSABLE) ×6 IMPLANT
GRASPER SUT TROCAR 14GX15 (MISCELLANEOUS) IMPLANT
HOLDER FOLEY CATH W/STRAP (MISCELLANEOUS) ×2 IMPLANT
IRRIG SUCT STRYKERFLOW 2 WTIP (MISCELLANEOUS) ×2
IRRIGATION SUCT STRKRFLW 2 WTP (MISCELLANEOUS) ×1 IMPLANT
KIT PROCEDURE DA VINCI SI (MISCELLANEOUS)
KIT PROCEDURE DVNC SI (MISCELLANEOUS) IMPLANT
KIT SIGMOIDOSCOPE (SET/KITS/TRAYS/PACK) IMPLANT
KIT TURNOVER KIT A (KITS) ×2 IMPLANT
NEEDLE INSUFFLATION 14GA 120MM (NEEDLE) ×2 IMPLANT
PACK CARDIOVASCULAR III (CUSTOM PROCEDURE TRAY) ×2 IMPLANT
PACK COLON (CUSTOM PROCEDURE TRAY) ×2 IMPLANT
PAD POSITIONING PINK XL (MISCELLANEOUS) ×2 IMPLANT
PROTECTOR NERVE ULNAR (MISCELLANEOUS) ×4 IMPLANT
RELOAD STAPLER 2.5X60 WHT DVNC (STAPLE) ×1 IMPLANT
RELOAD STAPLER 3.5X45 BLU DVNC (STAPLE) IMPLANT
RELOAD STAPLER 3.5X60 BLU DVNC (STAPLE) ×3 IMPLANT
RELOAD STAPLER 4.3X45 GRN DVNC (STAPLE) IMPLANT
RELOAD STAPLER 4.3X60 GRN DVNC (STAPLE) IMPLANT
RTRCTR WOUND ALEXIS 18CM MED (MISCELLANEOUS)
SCISSORS LAP 5X35 DISP (ENDOMECHANICALS) ×2 IMPLANT
SEAL CANN UNIV 5-8 DVNC XI (MISCELLANEOUS) ×3 IMPLANT
SEAL XI 5MM-8MM UNIVERSAL (MISCELLANEOUS) ×3
SEALER VESSEL DA VINCI XI (MISCELLANEOUS) ×1
SEALER VESSEL EXT DVNC XI (MISCELLANEOUS) ×1 IMPLANT
SOLUTION ELECTROLUBE (MISCELLANEOUS) ×2 IMPLANT
SPONGE GAUZE 2X2 STER 10/PKG (GAUZE/BANDAGES/DRESSINGS) ×1
STAPLER 45 DA VINCI SURE FORM (STAPLE)
STAPLER 45 SUREFORM DVNC (STAPLE) IMPLANT
STAPLER 60 DA VINCI SURE FORM (STAPLE) ×1
STAPLER 60 SUREFORM DVNC (STAPLE) ×1 IMPLANT
STAPLER CANNULA SEAL DVNC XI (STAPLE) ×1 IMPLANT
STAPLER CANNULA SEAL XI (STAPLE) ×1
STAPLER ECHELON POWER CIR 29 (STAPLE) IMPLANT
STAPLER ECHELON POWER CIR 31 (STAPLE) IMPLANT
STAPLER RELOAD 2.5X60 WHITE (STAPLE) ×1
STAPLER RELOAD 2.5X60 WHT DVNC (STAPLE) ×1
STAPLER RELOAD 3.5X45 BLU DVNC (STAPLE)
STAPLER RELOAD 3.5X45 BLUE (STAPLE)
STAPLER RELOAD 3.5X60 BLU DVNC (STAPLE) ×3
STAPLER RELOAD 3.5X60 BLUE (STAPLE) ×3
STAPLER RELOAD 4.3X45 GREEN (STAPLE)
STAPLER RELOAD 4.3X45 GRN DVNC (STAPLE)
STAPLER RELOAD 4.3X60 GREEN (STAPLE)
STAPLER RELOAD 4.3X60 GRN DVNC (STAPLE)
STOPCOCK 4 WAY LG BORE MALE ST (IV SETS) ×4 IMPLANT
SURGILUBE 2OZ TUBE FLIPTOP (MISCELLANEOUS) IMPLANT
SUT MNCRL AB 4-0 PS2 18 (SUTURE) ×2 IMPLANT
SUT PDS AB 1 CT1 27 (SUTURE) ×4 IMPLANT
SUT PROLENE 0 CT 2 (SUTURE) IMPLANT
SUT PROLENE 2 0 KS (SUTURE) IMPLANT
SUT PROLENE 2 0 SH DA (SUTURE) IMPLANT
SUT SILK 2 0 (SUTURE)
SUT SILK 2 0 SH CR/8 (SUTURE) IMPLANT
SUT SILK 2-0 18XBRD TIE 12 (SUTURE) IMPLANT
SUT SILK 3 0 (SUTURE)
SUT SILK 3 0 SH CR/8 (SUTURE) ×2 IMPLANT
SUT SILK 3-0 18XBRD TIE 12 (SUTURE) IMPLANT
SUT V-LOC BARB 180 2/0GR6 GS22 (SUTURE) ×4
SUT VIC AB 3-0 SH 18 (SUTURE) IMPLANT
SUT VIC AB 3-0 SH 27 (SUTURE)
SUT VIC AB 3-0 SH 27XBRD (SUTURE) IMPLANT
SUT VICRYL 0 UR6 27IN ABS (SUTURE) ×2 IMPLANT
SUTURE V-LC BRB 180 2/0GR6GS22 (SUTURE) ×2 IMPLANT
SYR 10ML ECCENTRIC (SYRINGE) ×2 IMPLANT
SYS LAPSCP GELPORT 120MM (MISCELLANEOUS)
SYS WOUND ALEXIS 18CM MED (MISCELLANEOUS) ×2
SYSTEM LAPSCP GELPORT 120MM (MISCELLANEOUS) IMPLANT
SYSTEM WOUND ALEXIS 18CM MED (MISCELLANEOUS) ×1 IMPLANT
TOWEL OR NON WOVEN STRL DISP B (DISPOSABLE) ×2 IMPLANT
TRAY FOLEY MTR SLVR 16FR STAT (SET/KITS/TRAYS/PACK) ×2 IMPLANT
TROCAR ADV FIXATION 5X100MM (TROCAR) ×2 IMPLANT
TUBING CONNECTING 10 (TUBING) ×4 IMPLANT
TUBING INSUFFLATION 10FT LAP (TUBING) ×2 IMPLANT

## 2021-03-16 NOTE — H&P (Signed)
03/16/2021 8:11 AM     REFERRING PHYSICIAN: Self  Patient Care Team: Domenick Gong, MD as PCP - General (Internal Medicine) Johney Maine, Adrian Saran, MD as Consulting Provider (General Surgery) Montez Morita, Quillian Quince, MD (Gastroenterology)  PROVIDER: Hollace Kinnier, MD  [facility] MRN: [id number] DOB: 16-Feb-1951 DATE OF ENCOUNTER: 01/29/2021  Subjective   Chief Complaint: Colon Polyp at cecum   History of Present Illness: Amy Reyes is a 70 y.o. female who is seen today as an office consultation at the request of Dr. Jenetta Downer for evaluation of Colon Polyps .   Active woman who underwent colonoscopy last month by Dr. Jenetta Downer. Polyps noted. One at the appendiceal orifice of the cecum. Seem to be going up into the appendix with not a good left. Biopsy consistent with tubular adenoma. Surgical consultation requested for removal.   Patient notes her father was diagnosed with colon cancer in his late 15s. She has had endoscopies in the past. Sounds like she has had a polyp in her cecum that has been attempted at resection by [facility] gastroenterology. She recently switched to gastrology closer to home up in [place]. Per GI note by Dr C: Patient has had colonoscopies in the past:   "Upon review of her previous procedures, the patient had her initial colonoscopy in 2019, an 18 mm polyp was removed from the cecum which was behind the ileocecal valve, 3 other polyps were removed from the ascending colon and sigmoid colon. Pathology was consistent with tubular adenomas for all polyps. Subsequent colonoscopy was performed 12/22/2018 which showed presence of 6 polyps located in the cecum, hepatic flexure and splenic flexure. Pathology showed normal tissue in the cecal polyp but the rest of the polyps were tubular adenomas and there was 1 sessile serrated polyp. Her last colonoscopy was performed in 03/23/2020 which showed a 10 mm residual polyp in the cecum behind the ileocecal valve which  was removed in a piecemeal fashion. She also had 3 other polyps removed from the ascending colon, hepatic flexure and rectum. Pathology was consistent with a tubular adenoma."   Patient read about me and liked the idea of my MIS/lap/robotic approaches and wondered if that was a possibility to do robotically. Patient notes that she moves her bowels once or twice a day. Can walk a few miles without difficulty. She did have a lap band placed when she lived in [place] by Dr. Vevelyn Pat in January 2014. She relocated to [facility] and was followed by Dr. Lucia Gaskins with our group. She did not have much success with the Lap-Band and has had fluid out for several years. Last discussion by Dr. Lucia Gaskins 2 years ago was offering to remove the Lap-Band to avoid future erosions. She held off on that but now that she needs surgery wonders if I could remove that as well. She has borderline diabetes. Last hemoglobin A1c 5.6. Her weight has been stable. She does snore but did have a study that disproved any definite sleep apnea. She does not smoke. Sister diagnosed with amyloid. Has some hypertension but otherwise relatively healthy  Medical History: Past Medical History:  Diagnosis Date   Anemia   Arthritis   GERD (gastroesophageal reflux disease)   Hypertension   There is no problem list on file for this patient.  No past surgical history on file.   No Known Allergies  Current Outpatient Medications on File Prior to Visit  Medication Sig Dispense Refill   ascorbic acid, vitamin C, (VITAMIN C) 250 MG tablet Take by  mouth   atorvastatin (LIPITOR) 80 MG tablet   cholecalciferol (VITAMIN D3) 2,000 unit tablet Take by mouth   losartan-hydrochlorothiazide (HYZAAR) 100-25 mg tablet   OZEMPIC 1 mg/dose (4 mg/3 mL) PnIj INJECT 1 MG INTO THE SKIN ONCE A WEEK   No current facility-administered medications on file prior to visit.   Family History  Problem Relation Age of Onset   Colon cancer Father     Social History   Tobacco Use  Smoking Status Never Smoker  Smokeless Tobacco Never Used    Social History   Socioeconomic History   Marital status: Single  Tobacco Use   Smoking status: Never Smoker   Smokeless tobacco: Never Used  Scientific laboratory technician Use: Never used  Substance and Sexual Activity   Alcohol use: Not Currently   Drug use: Defer   Sexual activity: Defer   ############################################################  Review of Systems: A complete review of systems (ROS) was obtained from the patient. I have reviewed this information and discussed as appropriate with the patient. See HPI as well for other pertinent ROS.  Constitutional: No fevers, chills, sweats. Weight stable Eyes: No vision changes, No discharge HENT: No sore throats, nasal drainage Lymph: No neck swelling, No bruising easily Pulmonary: No cough, productive sputum CV: No orthopnea, PND Patient walks 60 minutes for about 2 miles without difficulty. No exertional chest/neck/shoulder/arm pain.  GI: No personal nor family history of GI/colon cancer, inflammatory bowel disease, irritable bowel syndrome, allergy such as Celiac Sprue, dietary/dairy problems, colitis, ulcers nor gastritis. No recent sick contacts/gastroenteritis. No travel outside the country. No changes in diet.  Renal: No UTIs, No hematuria Genital: No drainage, bleeding, masses Musculoskeletal: No severe joint pain. Good ROM major joints Skin: No sores or lesions Heme/Lymph: No easy bleeding. No swollen lymph nodes  Objective:   There were no vitals filed for this visit.   There is no height or weight on file to calculate BMI.  PHYSICAL EXAM:  Constitutional: Not cachectic. Hygeine adequate. Vitals signs as above.  Eyes: Pupils reactive, normal extraocular movements. Sclera nonicteric Neuro: CN II-XII intact. No major focal sensory defects. No major motor deficits. Lymph: No head/neck/groin lymphadenopathy Psych:  No severe agitation. No severe anxiety. Judgment & insight Adequate, Oriented x4, HENT: Normocephalic, Mucus membranes moist. No thrush.  Neck: Supple, No tracheal deviation. No obvious thyromegaly Chest: No pain to chest wall compression. Good respiratory excursion. No audible wheezing CV: Pulses intact. Regular rhythm. No major extremity edema  Abdomen: Flat Hernia: Not present. Diastasis recti: Not present. Soft. Nondistended. Nontender. No hepatomegaly. No splenomegaly  Gen: Inguinal hernia: Not present. Inguinal lymph nodes: without lymphadenopathy.   Rectal: (Deferred)  Ext: No obvious deformity or contracture. Edema: Not present. No cyanosis Skin: No major subcutaneous nodules. Warm and dry Musculoskeletal: Severe joint rigidity not present. No obvious clubbing. No digital petechiae.   Labs, Imaging and Diagnostic Testing:  Located in [place]' section of Epic EMR chart  PRIOR NOTES  YASMINE KILBOURNE Documented: 03/18/2019 8:31 AM Location: [facility] Patient #: [id number] DOB: April 27, 1951 Divorced / Language: Cleophus Molt / Race: Black or African American Female  History of Present Illness Shanon Brow H. Lucia Gaskins MD; 03/18/2019 12:18 PM) The patient is a 70 year old female is here for LapBand followup.  The PCP is Dr. Alfonso Patten. Tisovec She was actually seen by Jonni Sanger one time.  I have not met her before.      She comes by her self.  [The Covid-19 virus has  disrupted normal medical care in [place] and across the nation.  We have sometimes had to alter normal surgical/medical care to limit this epidemic and we have explained these changes to the patient.]      She was last seen by Jonni Sanger on 05/11/2015.  In fact, that is her only visit with our office.    She had a placement of a AP standard lap band in January 2014 in [place], [place].       By her history, she's never really he is a lap band for weight control.  She's had a one adjustment by Jonni Sanger in the last 5 years.  She was  living in [place] until 2016, but moved to [place] to be closer to her family.     She complains of choking, gagging, and night brash.  She says she is still taster pills after she takes them.  She can't get food down and throws up a couple times a week.  Interestingly, she just saw Dr. Tarri Glenn and had an upper endoscopy in June.  A red Dr. Tarri Glenn upper endoscopy report.      She is ready to have the lap band removed.  She never was used for weight loss.  I convinced she'll let me remove fluid today and see how she does.  If she is truly not a every use lap band, she's best probably having a removed.     I discussed the surgery and the complications.  The potential complications including bleeding, bowel injury, open surgery, and continued GI complaints even with a lap band removed.  The biggest risk of keeping the lap band may be an erosion of lap band.      She is going to call back when she is ready for the surgery.  Review of Systems as stated in this history (HPI) or in the review of systems.  Otherwise all other 12 point ROS are negative  Past Medical History: 1.  Lap Band, AP standard - Jan 2014 - Dr. Vevelyn Pat in [facility] in [place]. 2.  Seen by Dr. Lottie Mussel ([facility])       Colonoscopy - 12/22/2018           She has some hemorrhoids.  Her father had colon ca - so she'll get another colonoscopy in one year.       EGD - 12/22/2018 - esophagitis, no mention of lap band or measurements of pouch     3.  Cholecystectomy 4.  HTN 5.  Pre diabetic 6.  History of breast reduction 7.  Left knee replaced - 2017 - Dr. Maureen Ralphs 8.  Remote history of kidney stones  Social History: Not married. She is taking care of a 70 yo.  This was her niece's son, but her niece died suddenly after weight loss surgery.     She is retired from [employer].  Allergies [facility], CMA; 03/18/2019 11:22 AM) No Known Drug Allergies   [05/11/2015]: Allergies Reconciled     Medication History (Sabrina Canty, CMA; 03/18/2019 11:22 AM) Atorvastatin Calcium  (20MG Tablet, Oral) Active. Estradiol  (0.5MG Tablet, Oral) Active. Losartan Potassium  (100MG Tablet, Oral) Active. Losartan Potassium  (50MG Tablet, Oral) Active. MetFORMIN HCl  (500MG Tablet, Oral) Active. Progesterone  (100MG Capsule, Oral) Active. (mg dose unknown) Medications Reconciled   Vitals (Sabrina Canty CMA; 03/18/2019 11:23 AM) 03/18/2019 11:22 AM Weight: 210.13 lb   Height: 66.5 in  Body Surface Area: 2.05 m  Body Mass Index: 33.41 kg/m   Temp.: 98 F  (Temporal)    Pulse: 98 (Regular)    P.OX: 99% (Room air) BP: 128/72(Sitting, Left Arm, Standard)    Physical Exam Shanon Brow H. Lucia Gaskins MD; 03/18/2019 12:11 PM) The physical exam findings are as follows: Note:  General: WN AA F who is alert and generally healthy appearing. She is wearing a mask, though it is driving her up a wall. HEENT: Normal. Pupils equal.  Neck: Supple. No mass. No thyroid mass. Lymph Nodes: No supraclavicular or cervical nodes.  Lungs: Clear to auscultation and symmetric breath sounds. Heart: RRR. No murmur or rub.  Abdomen: Soft. No mass. No tenderness. No hernia. Normal bowel sounds. Her lap band lays just to the left and above the umbilicus. Rectal: Not done.  Extremities: Good strength and ROM in upper and lower extremities.  PROCEDURES: I accessed her lap band with a Huber needle. I removed 4.0 cc of fluid from the lap band. She tolerated water after the procedure.  Assessment & Plan Shanon Brow H. Newman MD; 03/18/2019 12:08 PM) HISTORY OF LAPAROSCOPIC ADJUSTABLE GASTRIC BANDING (Z98.84) Impression: Plan:  1) Fluid removed today to see how she does  2) Will call back for lap band removal, when she is ready Dr. Pollie Friar nurse is April Current Plans Follow up as needed  SURGERY NOTES:  Not applicable  PATHOLOGY:  SURGICAL PATHOLOGY  CASE: [id number]  PATIENT: Amy Reyes  Surgical Pathology  Report   Clinical History: hx of colon polyps, fam hx colon ca   FINAL MICROSCOPIC DIAGNOSIS:   A.   COLON, ASCENDING, POLYPECTOMY:  - Tubular adenoma.  - No high-grade dysplasia or carcinoma.   B.   COLON, SIGMOID, POLYPECTOMY:  - Hyperplastic polyp.  - No adenomatous change or carcinoma.   Alexcis Bicking DESCRIPTION:   A.  Received in formalin labeled with the patient's name and "Ascending  polypectomy" is a 0.3 cm tan, rubbery, polypoid piece of tissue,  submitted in toto in a single cassette.   B.  Received in formalin labeled with the patient's name and "Sigmoid  polypectomy" is a 0.3 cm tan, rubbery, polypoid piece of tissue,  submitted in toto in a single cassette.   (LEF 12/13/2020)   Final Diagnosis performed by Claudette Laws, MD.   Electronically signed  12/14/2020  Technical component performed at [facility], [place]  [address], [place], [place] [address].   Professional component performed at [employer]. [facility],  [place] [address] [place], [place] [address].   Immunohistochemistry Technical component (if applicable) was performed  at [facility]. [address], [place],  [place], [place] [address].   IMMUNOHISTOCHEMISTRY DISCLAIMER (if applicable):  Some of these immunohistochemical stains may have been developed and the  performance characteristics determine by [facility]. Some  may not have been cleared or approved by the U.S. Food and Drug  Administration. The FDA has determined that such clearance or approval  is not necessary. This test is used for clinical purposes. It should not  be regarded as investigational or for research. This laboratory is  certified under the [place]  (CLIA-88) as qualified to perform high complexity clinical laboratory  testing.  The controls stained appropriately  Assessment and Plan:  DIAGNOSES:  Diagnoses and all orders for this  visit:  Adenomatous polyp of cecum - [facility] Case Posting Request; Future  Hx of laparoscopic adjustable gastric banding - [facility] Case Posting Request; Future  Other orders - polyethylene glycol (MIRALAX) powder; Take 233.75 g by mouth once for 1 dose Take according to your procedure prep instructions. - bisacodyL (DULCOLAX) 5 mg EC tablet; Take 4 tablets (20 mg total) by mouth once daily as needed for Constipation for up to 1 dose - metroNIDAZOLE (FLAGYL) 500 MG tablet; Take 1 tablet (500 mg total) by mouth 3 (three) times daily for 3 doses Take according to your procedure colon prep instructions - neomycin 500 mg tablet; Take 2 tablets (1,000 mg total) by mouth 3 (three) times daily for 3 doses Take according to your procedure colon prep instructions    ASSESSMENT/PLAN  Adenomatous polyp at appendiceal orifice of cecum. I think this most likely can be removed with an appendectomy with partial cecectomy. Possible need for ileocolonic resection. We will see. Try and do a full bowel prep just in case. She is very interested in trying to get this done robotically if possible. I noted I approach these minimally invasive. Discussed benefits and risks   The anatomy & physiology of the digestive tract was discussed. The pathophysiology of appendicitis and other appendiceal disorders were discussed. Natural history risks without surgery was discussed. I feel the risks of no intervention will lead to serious problems that outweigh the operative risks; therefore, I recommended diagnostic laparoscopy with removal of appendix to remove the pathology. Laparoscopic & open techniques were discussed. I noted a good likelihood this will help address the problem.   Risks such as bleeding, infection, abscess, leak, reoperation, injury to other organs, need for repair of tissues / organs, possible ostomy, hernia, heart attack, stroke, death, and other risks were discussed. Goals of post-operative recovery were  discussed as well. We will work to minimize complications. Questions were answered. The patient expresses understanding & wishes to proceed with surgery.   She also has a Lap-Band that has been present for 8 years and has not been used in over 2 years. She would like to have it removed since the port site is irritating. She had seen Dr. Lucia Gaskins with her group who was willing to do that. He has since retired though. I will double check with my bariatric partners, but probably would not add much time to do dissection to unclassified remove the band and the port site at the same time so that way she has 1 anesthesia. We will decrease risk of infection anyway. I called and discussed with my bariatric colleague, Dr. Romana Juniper, who thinks it is reasonable to remove at the same time since it has not been in use for many years to avoid lifetime erosion risk. I will see if one of my parent or partners can be available from surgery. Patient really likes the idea of doing all this robotically or least minimally invasive which I think is reasonable.   I had direct face-to-face contact with the patient for a total of 35 minutes and greater than 50% of that time was spent providing counseling and/or coordination of care for the patient regarding Recurrent/new colon polyps and prior laparoscopic adjustable gastric band.  ########################################################  Adin Hector, MD, FACS, MASCRS Esophageal, Gastrointestinal & Colorectal Surgery Robotic and Minimally Invasive Surgery  [facility], [place]  [place]. [address], [place], [place] [address] [phone] Fax [phone] Main       Note: Portions of this report may have been transcribed using voice recognition software. Every effort was made to ensure accuracy; however, inadvertent  computerized transcription errors may be present. Any transcriptional errors that  result from this process are unintentional.   Adin Hector, MD, FACS, MASCRS Esophageal, Gastrointestinal & Colorectal Surgery Robotic and Minimally Invasive Surgery  [facility], [place]  [place]. [address] [place], [place] [address] [phone] Fax [phone] Main  CONTACT INFORMATION:  Weekday (9AM-5PM): Call [facility] main office at [phone]  Weeknight (5PM-9AM) or Weekend/Holiday: Check [email or web address] (password " [email or web address]") for General Surgery [facility] coverage  (Please, do not use SecureChat as it is not reliable communication to operating surgeons for immediate patient care)    .

## 2021-03-16 NOTE — Anesthesia Procedure Notes (Signed)
Procedure Name: Intubation Date/Time: 03/16/2021 11:22 AM Performed by: Lollie Sails, CRNA Pre-anesthesia Checklist: Patient identified, Emergency Drugs available, Suction available, Patient being monitored and Timeout performed Patient Re-evaluated:Patient Re-evaluated prior to induction Oxygen Delivery Method: Circle system utilized Preoxygenation: Pre-oxygenation with 100% oxygen Induction Type: IV induction Ventilation: Mask ventilation without difficulty Laryngoscope Size: Miller and 3 Grade View: Grade I Tube type: Oral Tube size: 7.5 mm Number of attempts: 1 Airway Equipment and Method: Stylet Placement Confirmation: ETT inserted through vocal cords under direct vision, positive ETCO2 and breath sounds checked- equal and bilateral Secured at: 23 cm Tube secured with: Tape Dental Injury: Teeth and Oropharynx as per pre-operative assessment

## 2021-03-16 NOTE — Op Note (Addendum)
03/16/2021 2:29 PM  PATIENT:  Amy Reyes  70 y.o. female  Patient Care Team: Tisovec, Fransico Him, MD as PCP - General (Internal Medicine) Michael Boston, MD as Consulting Physician (Colon and Rectal Surgery) Montez Morita, Quillian Quince, MD as Consulting Physician (Gastroenterology)  PRE-OPERATIVE DIAGNOSIS:   COLON POLYP HISTORY OF LAP BAND WITH DESIRE FOR REMOVAL  POST-OPERATIVE DIAGNOSIS:   MASS OF ILEOCECAL REGION HISTORY OF LAP BAND WITH DESIRE FOR REMOVAL  PROCEDURE:   ROBOTIC APPENDECTOMY ROBOTIC PROXIMAL COLECTOMY ROBOTIC LYSIS OF ADHESIONS REMOVAL OF ADJUSTABLE LAP GASTRIC BAND TRANSVERSUS ABDOMINIS PLANE (TAP) BLOCK - BILATERAL  SURGEON:  Adin Hector, MD  ASSISTANT: Louanna Raw , MD  ANESTHESIA:     General  Regional TRANSVERSUS ABDOMINIS PLANE (TAP) nerve block for perioperative & postoperative pain control provided with liposomal bupivacaine (Experel) mixed with 0.25% bupivacaine as a Bilateral TAP block x 68m each side at the level of the transverse abdominis & preperitoneal spaces along the flank at the anterior axillary line, from subcostal ridge to iliac crest under laparoscopic guidance   Local field block at port sites & extraction wound  EBL:  Total I/O In: 1100 [I.V.:1000; IV Piggyback:100] Out: 170 [Urine:120; Blood:50]  Delay start of Pharmacological VTE agent (>24hrs) due to surgical blood loss or risk of bleeding:  no  DRAINS: none   SPECIMEN:   APPENDIX PROXIMAL COLON  LAP BAND (Not sent)  DISPOSITION OF SPECIMEN:  PATHOLOGY  COUNTS:  YES  PLAN OF CARE: Admit to inpatient   PATIENT DISPOSITION:  PACU - hemodynamically stable.  INDICATION:    Patient who underwent colonoscopy in June and found to have an adenomatous polyp.  Polypoid mass near appendiceal orifice of cecum.  Not able to be saline lifted off safely.  Desire for removal by surgery.  I recommended segmental resection by appendectomy and partial cecal wall  resection, possible need for proximal colectomy.  Patient also has a history of an adjustable gastric band placement over a [place]  Was not very used were functional.  Fluids been out for numerous years.  I desire for removal a few years ago but held off.  With undergoing intra-abdominal surgery, request for removal.  Seemed appropriate.  The anatomy & physiology of the digestive tract was discussed.  The pathophysiology was discussed.  Natural history risks without surgery was discussed.   I worked to give an overview of the disease and the frequent need to have multispecialty involvement.  I feel the risks of no intervention will lead to serious problems that outweigh the operative risks; therefore, I recommended a partial colectomy to remove the pathology.  Laparoscopic & open techniques were discussed.   Risks such as bleeding, infection, abscess, leak, reoperation, possible ostomy, hernia, heart attack, death, and other risks were discussed.  I noted a good likelihood this will help address the problem.   Goals of post-operative recovery were discussed as well.  We will work to minimize complications.  An educational handout on the pathology was given as well.  Questions were answered.    The patient expresses understanding & wishes to proceed with surgery.  OR FINDINGS: Firm polypoid mass near the ileocecal valve of the cecum.  Not able to removed by appendectomy/partial cecectomy.  Therefore proximal right colectomy done.  No obvious peritoneal disease.  Lap adjustable gastric banding with tubing exiting along the angle of His and greater curvature.  Able to be removed.  Confirmed all pieces  removed and intact.  No evidence of any erosion  CASE DATA:  Type of patient?: Elective [facility] Private Case  Status of Case? Elective Scheduled  Infection Present At Time Of Surgery (PATOS)?  NO  No obvious metastatic disease on visceral parietal peritoneum or liver.  It  is an isoperistaltic ileocolonic anastomosis that rests in the right mid-abdomen.  DESCRIPTION:   Informed consent was confirmed.  The patient underwent general anaesthesia without difficulty.  The patient was positioned with arms tucked & secured appropriately.  VTE prevention in place.  The patient's abdomen was clipped, prepped, & draped in a sterile fashion.  Surgical timeout confirmed our plan.  The patient was positioned in reverse Trendelenburg.  Abdominal entry was gained using Varess technique at the left subcostal ridge on the anterior abdominal wall.  No elevated EtCO2 noted.  Port placed.  Camera inspection revealed no injury.  Extra ports were carefully placed under direct laparoscopic visualization.  We docked the [employer] Vinci robot carefully and placed intstruments under visualization.  Was able to find the ileocecal region.  Found the appendix and elevated it.  Came through the mesoappendix with a sharp dissection as well focal bipolar.  Able to skeletonize the base of the appendix and cecum.  Did a 10m robotic stapler load for appendectomy and 5x2 cm cecal cuff.  Specimen removed inside and EcoSac.  I opened up on the back table.  I cannot find any polyp at the appendiceal orifice.  Again reviewed the colonoscopy report that noted it was near the ileocecal and appendiceal orifice.  Because we did not have a definite polyp and it was clearly there visually as well as reported, I decided proceed with proximal colectomy.  I freed adhesions of the greater omentum off the periumbilical region to find the Lap-Band tubing exiting left supraumbilical going underneath the left lateral sector of the liver.  Left that alone.  Mobilized & reflected the greater omentum and small bowel in the upper abdomen.  I was able to elevate the proximal colon to isolate the ileocolonic pedicle.  I scored the ileal mesentery just proximal to that.   I carried that further dissection in a medial to lateral  fashion.  I was able to bluntly get into the retro-mesenteric plane on the right side.  I freed the proximal right sided colonic mesentery off the retroperitoneum including the duodenal sweep, pancreatic head, & Gerota's fascia of the right kidney. I was able to get underneath the hepatic flexure.  I was able to get underneath the proximal and mid transverse colon.  I isolated the proximal ileocecal pedicle.  I skeletonized it & transected the vessels.    I then proceeded to mobilize the terminal ileum & proximal "right" colon in a lateral to medial fashion.  I mobilized the distal ileal mesentery off its retroperitoneal and pelvic attachments.  I mobilized the ascending colon off It is side wall attachments to the paracolic gutter and retroperitoneum.  I also mobilized the greater omentum off the mid transverse colon and mobilized the mid to proximal transverse colon in a superior to inferior fashion.  This allowed me to mobilize the hepatic flexure and get a complete mobilization of the proximal "right" colon to the mid-transverse colon..  I could isolate the pathology. When he went ahead and proceeded with transection.  I transected the distal ileal mesentery and then transected at the distal ileum with a robotic stapler 657mblue load.  I then transected transverse colon mesentery just proximal  to a dominant middle colic arterial pedicle radially.  Transected at the proximal transverse colon with a robotic stapler.  We assured hemostasis.   I did a side-to-side stapled anastomosis of ileum to mid-transverse colon using a 72m white load in an isoperistaltic fashion.  (Distal stump of ileum to mid transverse colon for the distal end of the anastomosis.  Proximal end of colon stump to more proximal ileum for the proximal end of the anastomosis).  I sewed the common staple channel wound with an absorbable suture ( 2-0 V-lock) in a running CLake LeAnnfashion from each corner and meeting in the center.  I did  meticulous inspection prove an airtight closure.  I protected the anastomosis line with an anterior omentopexy of greater omentum using V lock suture.  We did reinspection of the abdomen.  Hemostasis was good.   Ureters, retroperitoneum, and bowel uninjured.  The anastomosis looked healthy.     We then turned attention to the left upper quadrant to remove the Lap-Band.  Found the tubing is entered in the left supraumbilical paramedian region and followed it over the stomach.  Freed adhesions between the left lateral sector of the liver and the buckle of the Lap-Band.  Isolated the Lap-Band around the cardia of the stomach.  It was rather mobile and desufflated.  Used scissors to cut through the buccal and release it.  Transected the tubing and removed the Lap-Band from around the cardia.  Brought out.  Brought out the color of this connector sleeve near the buckle of the band as well.  No evidence of any injury or other concern.  No obvious evidence of inflammation phlegmon or erosion.  Endoluminal gas was evacuated.  We placed the wound protector through the left suprapubic 156mport site after it was enlarged in a Pfannenstiel fashion.  Proximal right colon specimen removed without incident.  I went to the back table and opened up the ascending colon and came proximally and was able to identify a fibrotic polypoid mass closer to the ileocecal valve that correlated with the area of concern.  Mostly mobile but firm.  I scrubbed back in ports & wound protector removed.   Sterile unused instruments were used from this point. Hemostasis was good.  We made an incision over the left supra umbilical region from the prior transfer scar where the band in place.  Came through the subcutaneous tissues and easily found the band.  We are able to free adhesions circumferentially to the subcutaneous region as well as the fascia and removed the band out with tubing able to remove the connector clamp between tubing and the  port of the band.  Did inspection and confirmed all pieces had been removed.  Dr. StThermon Leylande agreed.  Fascial defect at that region was only as widest the narrow tubing, so no more aggressive fascial closure was done.  We focused on closure.  I closed the skin at the port sites using Monocryl stitch and sterile dressing.  I closed the extraction wound using a 0 Vicryl vertical peritoneal closure and a #1 PDS transverse anterior rectal fascial closure like a small Pfannenstiel closure. I closed the skin with some interrupted Monocryl stitches.  I placed sterile dressings.     Patient is being extubated go to recovery room. I discussed postop care with the patient in detail the office & in the holding area. Instructions are written. I discussed operative findings, updated the patient's status, discussed probable steps to recovery, and gave postoperative  recommendations to the patient's granddaughter .  Recommendations were made.  Questions were answered.  She expressed understanding & appreciation.   Adin Hector, M.D., F.A.C.S. Gastrointestinal and Minimally Invasive Surgery [facility] [address], [place], [place] [address] [phone] Main / Paging

## 2021-03-16 NOTE — Anesthesia Postprocedure Evaluation (Signed)
Anesthesia Post Note  Patient: Amy Reyes  Procedure(s) Performed: ROBOTIC APPENDECTOMY, PROXIMAL COLECTOMY, LYSIS OF ADHESIONS REMOVAL OF ADJUSTABLE LAP GASTRIC BAND     Patient location during evaluation: PACU Anesthesia Type: General Level of consciousness: awake Pain management: pain level controlled Vital Signs Assessment: post-procedure vital signs reviewed and stable Respiratory status: spontaneous breathing, nonlabored ventilation, respiratory function stable and patient connected to nasal cannula oxygen Cardiovascular status: blood pressure returned to baseline and stable Postop Assessment: no apparent nausea or vomiting Anesthetic complications: no   No notable events documented.  Last Vitals:  Vitals:   03/16/21 1600 03/16/21 1630  BP: (!) 119/56 (!) 126/59  Pulse: 68 67  Resp: 16 15  Temp: 36.4 C   SpO2: 100% 99%    Last Pain:  Vitals:   03/16/21 1600  TempSrc:   PainSc: 0-No pain                 Suzy Kugel P Adelbert Gaspard

## 2021-03-16 NOTE — Anesthesia Preprocedure Evaluation (Addendum)
Anesthesia Evaluation  Patient identified by MRN, date of birth, ID band Patient awake    Reviewed: Allergy & Precautions, NPO status , Patient's Chart, lab work & pertinent test results  Airway Mallampati: II  TM Distance: >3 FB Neck ROM: Full    Dental  (+) Partial Upper, Partial Lower   Pulmonary former smoker,    Pulmonary exam normal breath sounds clear to auscultation       Cardiovascular hypertension, Pt. on medications Normal cardiovascular exam Rhythm:Regular Rate:Normal  ECG: SR, rate 60   Neuro/Psych negative neurological ROS  negative psych ROS   GI/Hepatic Neg liver ROS, PUD,   Endo/Other  diabetes  Renal/GU Renal disease     Musculoskeletal  (+) Arthritis ,   Abdominal (+) + obese,   Peds  Hematology HLD   Anesthesia Other Findings COLON POLYP  Reproductive/Obstetrics                            Anesthesia Physical Anesthesia Plan  ASA: 3  Anesthesia Plan: General   Post-op Pain Management:    Induction: Intravenous  PONV Risk Score and Plan: 4 or greater and Ondansetron, Dexamethasone, Midazolam and Treatment may vary due to age or medical condition  Airway Management Planned: Oral ETT  Additional Equipment:   Intra-op Plan:   Post-operative Plan: Extubation in OR  Informed Consent: I have reviewed the patients History and Physical, chart, labs and discussed the procedure including the risks, benefits and alternatives for the proposed anesthesia with the patient or authorized representative who has indicated his/her understanding and acceptance.     Dental advisory given  Plan Discussed with: CRNA  Anesthesia Plan Comments:         Anesthesia Quick Evaluation

## 2021-03-16 NOTE — Interval H&P Note (Signed)
History and Physical Interval Note:  03/16/2021 10:30 AM  Correen Clayton Bibles  has presented today for surgery, with the diagnosis of COLON POLYP.  The various methods of treatment have been discussed with the patient and family. After consideration of risks, benefits and other options for treatment, the patient has consented to  Procedure(s): ROBOTIC PARTIAL CECECTOMY / APPENDECTOMY, WITH POSSIBLE PROXIMAL COLECTOMY (N/A) REMOVAL OF ADJUSTABLE LAP GASTRIC BAND (N/A) as a surgical intervention.  The patient's history has been reviewed, patient examined, no change in status, stable for surgery.  I have reviewed the patient's chart and labs.  Questions were answered to the patient's satisfaction.    I have re-reviewed the the patient's records, history, medications, and allergies.  I have re-examined the patient.  I again discussed intraoperative plans and goals of post-operative recovery.  The patient agrees to proceed.  KRISLIN CASTONGUAY  09-11-1950 [id number]  Patient Care Team: Haywood Pao, MD as PCP - General (Internal Medicine) Michael Boston, MD as Consulting Physician (Colon and Rectal Surgery) Montez Morita, Quillian Quince, MD as Consulting Physician (Gastroenterology)  Patient Active Problem List   Diagnosis Date Noted   Primary osteoarthritis of right knee 04/24/2020   Endometrial polyp 03/19/2020   Fibroids, intramural 11/12/2019   Hypertension    Hypercholesteremia    History of kidney stones    Diabetes mellitus without complication ([place])    Colon polyps    OA (osteoarthritis) of knee 10/14/2016    Past Medical History:  Diagnosis Date   Allergy    seasonal allergies   Anemia    Arthritis    oa   Bronchitis hx of   Chronic kidney disease    ckd stage 2- 3 due to NSAIDS use no nephrologist   Colon polyps    Complication of anesthesia    slow to wake up one time, none recent   Diabetes mellitus without complication ([place])    borderline type 2 on metformin    History of carpal tunnel syndrome    years ago   History of kidney stones    History of rotator cuff tear    Bilateral   Hypercholesteremia    on meds   Hypertension    on meds   Peptic ulcer    PMB (postmenopausal bleeding)    last time middle of april 2021    Past Surgical History:  Procedure Laterality Date   ABDOMINOPLASTY  30 yrs ago   tummy tuck   BREAST SURGERY     reduction   CHOLECYSTECTOMY  2000   laparoscopic   COLONOSCOPY  2019   TA   COLONOSCOPY WITH PROPOFOL N/A 12/13/2020   Procedure: COLONOSCOPY WITH PROPOFOL;  Surgeon: Harvel Quale, MD;  [facility];  Service: Gastroenterology;  Laterality: N/A;  11:00   DILATATION & CURETTAGE/HYSTEROSCOPY WITH MYOSURE N/A 12/20/2019   Procedure: DILATATION & CURETTAGE/HYSTEROSCOPY WITH MYOSURE RESECTION OF POLYP;  Surgeon: Megan Salon, MD;  Location: [facility];  Service: Gynecology;  Laterality: N/A;  resection of polyp,  to follow first case   INJECTION KNEE Left 10/14/2016   Procedure: CORTISONE LEFT KNEE INJECTION;  Surgeon: Gaynelle Arabian, MD;  Location: [facility];  Service: Orthopedics;  Laterality: Left;   LAPAROSCOPIC GASTRIC BANDING  2010 or 2011   left knee injection  6 weeks ago   POLYPECTOMY     TA   POLYPECTOMY  12/13/2020   Procedure: POLYPECTOMY;  Surgeon: Harvel Quale, MD;  [facility];  Service: Gastroenterology;;  ascending colon   REDUCTION MAMMAPLASTY     ROTATOR CUFF REPAIR Left 05/2019   TOTAL KNEE ARTHROPLASTY Right 10/14/2016   Procedure: RIGHT TOTAL KNEE ARTHROPLASTY;  Surgeon: Gaynelle Arabian, MD;  [facility];  Service: Orthopedics;  Laterality: Right;  Adductor Block   TOTAL KNEE ARTHROPLASTY Left 04/24/2020   Procedure: TOTAL KNEE ARTHROPLASTY;  Surgeon: Gaynelle Arabian, MD;  [facility];  Service: Orthopedics;  Laterality: Left;  29mn    Social History   Socioeconomic History   Marital status: Widowed    Spouse name:  2   Number of children: Not on file   Years of education: Not on file   Highest education level: Not on file  Occupational History   Occupation: retired  Tobacco Use   Smoking status: Former    Years: 1.00    Types: Cigarettes    Quit date: 05/16/1983    Years since quitting: 37.8   Smokeless tobacco: Never  Vaping Use   Vaping Use: Never used  Substance and Sexual Activity   Alcohol use: Never    Alcohol/week: 2.0 standard drinks    Types: 2 Glasses of wine per week   Drug use: No   Sexual activity: Yes    Birth control/protection: Post-menopausal    Comment: rare  Other Topics Concern   Not on file  Social History Narrative   Not on file   Social Determinants of Health   Financial Resource Strain: Not on file  Food Insecurity: Not on file  Transportation Needs: Not on file  Physical Activity: Not on file  Stress: Not on file  Social Connections: Not on file  Intimate Partner Violence: Not on file    Family History  Problem Relation Age of Onset   Hypertension Mother    Thyroid disease Mother    Kidney disease Sister    Hypertension Sister    Diabetes Maternal Grandfather    Diabetes Maternal Aunt    Diabetes Maternal Uncle    Prostate cancer Father 627  Colon cancer Father 665  Colon polyps Father 622  Hypertension Brother    Uterine cancer Maternal Aunt    Rectal cancer Neg Hx    Stomach cancer Neg Hx     Medications Prior to Admission  Medication Sig Dispense Refill Last Dose   albuterol (VENTOLIN HFA) 108 (90 Base) MCG/ACT inhaler Inhale 1-2 puffs into the lungs every 6 (six) hours as needed for wheezing or shortness of breath.   Past Month   atorvastatin (LIPITOR) 80 MG tablet Take 80 mg by mouth in the morning.   03/15/2021   Cholecalciferol (VITAMIN D3) 50 MCG (2000 UT) TABS Take 6,000 Units by mouth in the morning.   03/13/2021   ferrous sulfate (SLOW IRON) 160 (50 Fe) MG TBCR SR tablet Take 1 tablet by mouth in the morning.   03/15/2021    losartan-hydrochlorothiazide (HYZAAR) 100-25 MG tablet Take 1 tablet by mouth in the morning.   03/15/2021   OZEMPIC, 1 MG/DOSE, 4 MG/3ML SOPN Inject 1 mg into the skin once a week.   03/11/2021   vitamin C (ASCORBIC ACID) 250 MG tablet Take 750 mg by mouth in the morning.   03/13/2021   cyclobenzaprine (FLEXERIL) 10 MG tablet Take 1 tablet (10 mg total) by mouth 3 (three) times daily as needed for muscle spasms. 40 tablet 0    omeprazole (PRILOSEC) 40 MG capsule Take 1 capsule (40 mg total) by  mouth daily. 90 capsule 3    polyethylene glycol-electrolytes (TRILYTE) 420 g solution Take 4,000 mLs by mouth as directed. 4000 mL 0     Current Facility-Administered Medications  Medication Dose Route Frequency Provider Last Rate Last Admin   bisacodyl (DULCOLAX) EC tablet 20 mg  20 mg Oral Once Michael Boston, MD       bupivacaine liposome (EXPAREL) 1.3 % injection 266 mg  20 mL Infiltration Once Michael Boston, MD       cefoTEtan (CEFOTAN) 2 g in sodium chloride 0.9 % 100 mL IVPB  2 g Intravenous On Call to OR Dimple Nanas, RPH       feeding supplement (ENSURE PRE-SURGERY) liquid 296 mL  296 mL Oral Once Michael Boston, MD       feeding supplement (ENSURE PRE-SURGERY) liquid 592 mL  592 mL Oral Once Michael Boston, MD       lactated ringers infusion   Intravenous Continuous Josephine Igo, MD 10 mL/hr at 03/16/21 0943 New Bag at 03/16/21 0943   neomycin (MYCIFRADIN) tablet 1,000 mg  1,000 mg Oral 3 times per day Michael Boston, MD       And   metroNIDAZOLE (FLAGYL) tablet 1,000 mg  1,000 mg Oral 3 times per day Michael Boston, MD       polyethylene glycol powder (GLYCOLAX/MIRALAX) container 255 g  1 Container Oral Once Michael Boston, MD         Allergies  Allergen Reactions   Tramadol Itching   Codeine     Does not like the side effects-post taking    BP (!) 116/59   Pulse 98   Temp 97.8 F (36.6 C) (Oral)   Resp 18   Ht '5\' 6"'$  (1.676 m)   Wt 91.6 kg   SpO2 99%   BMI 32.60 kg/m    Labs: Results for orders placed or performed during the hospital encounter of 03/16/21 (from the past 48 hour(s))  Glucose, capillary     Status: None   Collection Time: 03/16/21  9:38 AM  Result Value Ref Range   Glucose-Capillary 95 70 - 99 mg/dL    Comment: Glucose reference range applies only to samples taken after fasting for at least 8 hours.    Imaging / Studies: MM 3D SCREEN BREAST BILATERAL  Result Date: 03/07/2021 CLINICAL DATA:  Screening. EXAM: DIGITAL SCREENING BILATERAL MAMMOGRAM WITH TOMOSYNTHESIS AND CAD TECHNIQUE: Bilateral screening digital craniocaudal and mediolateral oblique mammograms were obtained. Bilateral screening digital breast tomosynthesis was performed. The images were evaluated with computer-aided detection. COMPARISON:  None. ACR Breast Density Category b: There are scattered areas of fibroglandular density. FINDINGS: In the left breast, possible masses warrant further evaluation. In the right breast, no findings suspicious for malignancy. IMPRESSION: Further evaluation is suggested for possible masses in the left breast. RECOMMENDATION: Diagnostic mammogram and possibly ultrasound of the left breast. (Code:FI-L-30M) The patient will be contacted regarding the findings, and additional imaging will be scheduled. BI-RADS CATEGORY  0: Incomplete. Need additional imaging evaluation and/or prior mammograms for comparison. Electronically Signed   By: Lajean Manes M.D.   On: 03/07/2021 10:30     .Adin Hector, M.D., F.A.C.S. Gastrointestinal and Minimally Invasive Surgery [facility] [address], [place], [place] [address] [phone] Main / Paging  03/16/2021 10:30 AM     Adin Hector

## 2021-03-16 NOTE — Transfer of Care (Signed)
Immediate Anesthesia Transfer of Care Note  Patient: Amy Reyes  Procedure(s) Performed: ROBOTIC APPENDECTOMY, PROXIMAL COLECTOMY, LYSIS OF ADHESIONS REMOVAL OF ADJUSTABLE LAP GASTRIC BAND  Patient Location: PACU  Anesthesia Type:General  Level of Consciousness: awake, drowsy and responds to stimulation  Airway & Oxygen Therapy: Patient Spontanous Breathing and Patient connected to face mask oxygen  Post-op Assessment: Report given to RN and Post -op Vital signs reviewed and stable  Post vital signs: Reviewed and stable  Last Vitals:  Vitals Value Taken Time  BP 150/57 03/16/21 1428  Temp    Pulse 88 03/16/21 1429  Resp 17 03/16/21 1429  SpO2 100 % 03/16/21 1429  Vitals shown include unvalidated device data.  Last Pain:  Vitals:   03/16/21 0956  TempSrc: Oral  PainSc:          Complications: No notable events documented.

## 2021-03-17 DIAGNOSIS — E669 Obesity, unspecified: Secondary | ICD-10-CM

## 2021-03-17 DIAGNOSIS — N200 Calculus of kidney: Secondary | ICD-10-CM | POA: Insufficient documentation

## 2021-03-17 DIAGNOSIS — Z9884 Bariatric surgery status: Secondary | ICD-10-CM

## 2021-03-17 LAB — BASIC METABOLIC PANEL
Anion gap: 7 (ref 5–15)
BUN: 27 mg/dL — ABNORMAL HIGH (ref 8–23)
CO2: 26 mmol/L (ref 22–32)
Calcium: 8.5 mg/dL — ABNORMAL LOW (ref 8.9–10.3)
Chloride: 100 mmol/L (ref 98–111)
Creatinine, Ser: 1.78 mg/dL — ABNORMAL HIGH (ref 0.44–1.00)
GFR, Estimated: 30 mL/min — ABNORMAL LOW (ref >60)
Glucose, Bld: 110 mg/dL — ABNORMAL HIGH (ref 70–99)
Potassium: 4.2 mmol/L (ref 3.5–5.1)
Sodium: 133 mmol/L — ABNORMAL LOW (ref 135–145)

## 2021-03-17 LAB — CBC
HCT: 30.4 % — ABNORMAL LOW (ref 36.0–46.0)
Hemoglobin: 9.7 g/dL — ABNORMAL LOW (ref 12.0–15.0)
MCH: 26.7 pg (ref 26.0–34.0)
MCHC: 31.9 g/dL (ref 30.0–36.0)
MCV: 83.7 fL (ref 80.0–100.0)
Platelets: 240 10*3/uL (ref 150–400)
RBC: 3.63 MIL/uL — ABNORMAL LOW (ref 3.87–5.11)
RDW: 15.1 % (ref 11.5–15.5)
WBC: 8.8 10*3/uL (ref 4.0–10.5)
nRBC: 0 % (ref 0.0–0.2)

## 2021-03-17 LAB — MAGNESIUM: Magnesium: 1.5 mg/dL — ABNORMAL LOW (ref 1.7–2.4)

## 2021-03-17 MED ORDER — SODIUM CHLORIDE 0.9 % IV SOLN: 250.0000 mL | INTRAVENOUS | Status: DC | PRN

## 2021-03-17 MED ORDER — SODIUM CHLORIDE 0.9% FLUSH: 3.0000 mL | INTRAVENOUS | Status: DC | PRN

## 2021-03-17 MED ORDER — SODIUM CHLORIDE 0.9% FLUSH: 3.0000 mL | Freq: Two times a day (BID) | INTRAVENOUS | Status: DC

## 2021-03-17 MED ORDER — LACTATED RINGERS IV BOLUS: 1000.0000 mL | Freq: Three times a day (TID) | INTRAVENOUS | Status: DC | PRN

## 2021-03-17 MED ORDER — MAGNESIUM SULFATE 2 GM/50ML IV SOLN
2.0000 g | Freq: Once | INTRAVENOUS | Status: AC
  Administered 2021-03-17: 2 g via INTRAVENOUS
  Filled 2021-03-17: qty 50

## 2021-03-17 NOTE — Progress Notes (Signed)
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  Not able to removed by appendectomy/partial cecectomy.  Therefore proximal right colectomy done.  No obvious peritoneal disease.  Lap adjustable gastric banding with tubing exiting along the angle of His and greater curvature.  Able to be removed.  Confirmed all pieces removed and intact.  No evidence of any erosion   CASE DATA:   Type of patient?: Elective [facility] Private Case   Status of Case? Elective Scheduled   Infection Present At Time Of Surgery (PATOS)?  NO   No obvious metastatic disease on visceral parietal peritoneum or liver.  It is an isoperistaltic ileocolonic anastomosis that rests in the right mid-abdomen.  Assessment  Recovering relatively  well  [facility] Stay = 1 days)  Plan: Follow-up on pathology  -ERAS protocol.  Stop IV fluids.  DC Foley.  Advance diet.  Follow blood pressure.  Home blood pressure medicines.  Low threshold to hold.  -VTE prophylaxis- SCDs, etc  -mobilize as tolerated to help recovery  Disposition:  Disposition:  The patient is from: Home  Anticipate discharge to:  Home  Anticipated Date of Discharge is:  September 4,2022    Barriers to discharge:  Pending Clinical improvement (more likely than not)  Patient currently is NOT MEDICALLY STABLE for discharge from the hospital from a surgery standpoint.      20 minutes spent in review, evaluation, examination, counseling, and coordination of care.   I have reviewed this patient's available data, including medical history, events of note, physical examination and test results as part of my evaluation.  A significant portion of that time was spent in counseling.  Care during the described time interval was provided by me.  Patient concerned about diagnosis of the mass.  I noted we need to wait for pathology to come back.  Focus on recovering from surgery.  Certainly we will update her once pathology comes back sometime next week.  03/17/2021    Subjective: (Chief complaint)  Sitting up in chair smiling.  Denies any pain.  Tolerating liquids.  Very appreciative of care.  Many questions about surgery.  Objective:  Vital signs:  Vitals:   03/16/21 2131 03/17/21 0206 03/17/21 0500 03/17/21 0637  BP: 125/62 (!) 115/54  (!) 118/54  Pulse: 69 69  60  Resp: '18 18  18  '$ Temp: 98.2 F (36.8 C) 98.7  F (37.1 C)  98 F (36.7 C)  TempSrc: Oral Oral  Oral  SpO2: 97% 95%  99%  Weight:   97.9 kg   Height:        Last BM Date: 03/17/21  Intake/Output   Yesterday:  09/02 0701 - 09/03 0700 In: 2355 [P.O.:840; I.V.:1415; IV Piggyback:100] Out: [id number] [Urine:1620; Blood:50] This shift:  No intake/output data recorded.  Bowel  function:  Flatus: YES  BM:  No  Drain: (No drain)   Physical Exam:  General: Pt awake/alert in no acute distress.  Talking to family on phone.  Sitting up in chair smiling. Eyes: PERRL, normal EOM.  Sclera clear.  No icterus Neuro: CN II-XII intact w/o focal sensory/motor deficits. Lymph: No head/neck/groin lymphadenopathy Psych:  No delerium/psychosis/paranoia.  Oriented x 4 HENT: Normocephalic, Mucus membranes moist.  No thrush Neck: Supple, No tracheal deviation.  No obvious thyromegaly Chest: No pain to chest wall compression.  Good respiratory excursion.  No audible wheezing CV:  Pulses intact.  Regular rhythm.  No major extremity edema MS: Normal AROM mjr joints.  No obvious deformity  Abdomen: Soft.  Nondistended.  Nontender.  No evidence of peritonitis.  No incarcerated hernias.  Ext:   No deformity.  No mjr edema.  No cyanosis Skin: No petechiae / purpurea.  No major sores.  Warm and dry    Results:   Cultures: Recent Results (from the past 720 hour(s))  SARS Coronavirus 2 (TAT 6-24 hrs)     Status: None   Collection Time: 03/13/21 12:00 AM  Result Value Ref Range Status   SARS Coronavirus 2 RESULT: NEGATIVE  Final    Comment: RESULT: NEGATIVESARS-CoV-2 INTERPRETATION:A NEGATIVE  test result means that SARS-CoV-2 RNA was not present in the specimen above the limit of detection of this test. This does not preclude a possible SARS-CoV-2 infection and should not be used as the  sole basis for patient management decisions. Negative results must be combined with clinical observations, patient history, and epidemiological information. Optimum specimen types and timing for peak viral levels during infections caused by SARS-CoV-2  have not been determined. Collection of multiple specimens or types of specimens may be necessary to detect virus. Improper specimen collection and handling, sequence variability under primers/probes, or organism present below the limit of detection  may  lead to false negative results. Positive and negative predictive values of testing are highly dependent on prevalence. False negative test results are more likely when prevalence of disease is high.The expected result is NEGATIVE.Fact S heet for  Healthcare Providers: LocalChronicle.no Sheet for Patients: SalonLookup.es Reference Range - Negative     Labs: Results for orders placed or performed during the hospital encounter of 03/16/21 (from the past 48 hour(s))  Glucose, capillary     Status: None   Collection Time: 03/16/21  9:38 AM  Result Value Ref Range   Glucose-Capillary 95 70 - 99 mg/dL    Comment: Glucose reference range applies only to samples taken after fasting for at least 8 hours.  Glucose, capillary     Status: Abnormal   Collection Time: 03/16/21  2:35 PM  Result Value Ref Range   Glucose-Capillary 152 (H) 70 - 99 mg/dL    Comment: Glucose reference range applies only to samples taken after fasting for at least 8 hours.  Basic metabolic panel     Status: Abnormal   Collection Time: 03/17/21  4:48 AM  Result Value Ref Range   Sodium 133 (L) 135 - 145 mmol/L  Potassium 4.2 3.5 - 5.1 mmol/L   Chloride 100 98 - 111 mmol/L   CO2 26 22 - 32 mmol/L   Glucose, Bld 110 (H) 70 - 99 mg/dL    Comment: Glucose reference range applies only to samples taken after fasting for at least 8 hours.   BUN 27 (H) 8 - 23 mg/dL   Creatinine, Ser 1.78 (H) 0.44 - 1.00 mg/dL   Calcium 8.5 (L) 8.9 - 10.3 mg/dL   GFR, Estimated 30 (L) >60 mL/min    Comment: (NOTE) Calculated using the CKD-EPI Creatinine Equation (2021)    Anion gap 7 5 - 15    Comment: Performed at [facility], [place] [address], [place], [place] [address]  CBC     Status: Abnormal   Collection Time: 03/17/21  4:48 AM  Result Value Ref Range   WBC 8.8 4.0 - 10.5 K/uL   RBC 3.63 (L) 3.87 - 5.11 MIL/uL   Hemoglobin 9.7 (L) 12.0 - 15.0 g/dL    HCT 30.4 (L) 36.0 - 46.0 %   MCV 83.7 80.0 - 100.0 fL   MCH 26.7 26.0 - 34.0 pg   MCHC 31.9 30.0 - 36.0 g/dL   RDW 15.1 11.5 - 15.5 %   Platelets 240 150 - 400 K/uL   nRBC 0.0 0.0 - 0.2 %    Comment: Performed at [facility], [place] [address] [place], [place] [address]  Magnesium     Status: Abnormal   Collection Time: 03/17/21  4:48 AM  Result Value Ref Range   Magnesium 1.5 (L) 1.7 - 2.4 mg/dL    Comment: Performed at [facility], [place] [address], [place], [place] [address]    Imaging / Studies: No results found.  Medications / Allergies: per chart  Antibiotics: Anti-infectives (From admission, onward)    Start     Dose/Rate Route Frequency Ordered Stop   03/16/21 2200  cefoTEtan (CEFOTAN) 2 g in sodium chloride 0.9 % 100 mL IVPB        2 g 200 mL/hr over 30 Minutes Intravenous Every 12 hours 03/16/21 1827 03/16/21 2336   03/16/21 1400  neomycin (MYCIFRADIN) tablet 1,000 mg  Status:  Discontinued       See Hyperspace for full Linked Orders Report.   1,000 mg Oral 3 times per day 03/16/21 0916 03/16/21 1813   03/16/21 1400  metroNIDAZOLE (FLAGYL) tablet 1,000 mg  Status:  Discontinued       See Hyperspace for full Linked Orders Report.   1,000 mg Oral 3 times per day 03/16/21 0916 03/16/21 1813   03/16/21 0600  cefoTEtan (CEFOTAN) 2 g in sodium chloride 0.9 % 100 mL IVPB        2 g 200 mL/hr over 30 Minutes Intravenous On call to O.R. 03/15/21 0739 03/16/21 1144         Note: Portions of this report may have been transcribed using voice recognition software. Every effort was made to ensure accuracy; however, inadvertent computerized transcription errors may be present.   Any transcriptional errors that result from this process are unintentional.    Adin Hector, MD, FACS, MASCRS Esophageal, Gastrointestinal & Colorectal Surgery Robotic and Minimally Invasive Surgery  [facility], [place]  [place]. [address] [place],  [address] [phone] Fax [phone] Main  CONTACT INFORMATION:  Weekday (9AM-5PM): Call [facility] main office at [phone]  Weeknight (5PM-9AM) or Weekend/Holiday: Check  [email or web address] (password " [email or web address]") for General Surgery [facility] coverage  (Please, do not use SecureChat as it is not reliable communication to operating surgeons for immediate patient care)      03/17/2021  9:55 AM

## 2021-03-18 MED ORDER — ONDANSETRON HCL 4 MG PO TABS
4.0000 mg | ORAL_TABLET | Freq: Four times a day (QID) | ORAL | 5 refills | Status: DC | PRN
  Filled 2021-03-18: qty 4, 1d supply, fill #0

## 2021-03-18 MED ORDER — OXYCODONE HCL 5 MG PO TABS: 5.0000 mg | ORAL_TABLET | Freq: Four times a day (QID) | ORAL | 0 refills | Status: DC | PRN

## 2021-03-18 MED ORDER — OXYCODONE HCL 5 MG PO TABS
5.0000 mg | ORAL_TABLET | Freq: Four times a day (QID) | ORAL | 0 refills | Status: DC | PRN
  Filled 2021-03-18: qty 20, 3d supply, fill #0

## 2021-03-18 MED ORDER — ONDANSETRON HCL 4 MG PO TABS: 4.0000 mg | ORAL_TABLET | Freq: Four times a day (QID) | ORAL | 3 refills | Status: DC | PRN

## 2021-03-18 NOTE — Discharge Instructions (Signed)
SURGERY: POST OP INSTRUCTIONS (Surgery for small bowel obstruction, colon resection, etc)   ######################################################################  EAT Gradually transition to a high fiber diet with a fiber supplement over the next few days after discharge  WALK Walk an hour a day.  Control your pain to do that.    CONTROL PAIN Control pain so that you can walk, sleep, tolerate sneezing/coughing, go up/down stairs.  HAVE A BOWEL MOVEMENT DAILY Keep your bowels regular to avoid problems.  OK to try a laxative to override constipation.  OK to use an antidairrheal to slow down diarrhea.  Call if not better after 2 tries  CALL IF YOU HAVE PROBLEMS/CONCERNS Call if you are still struggling despite following these instructions. Call if you have concerns not answered by these instructions  ######################################################################   DIET Follow a light diet the first few days at home.  Start with a bland diet such as soups, liquids, starchy foods, low fat foods, etc.  If you feel full, bloated, or constipated, stay on a ful liquid or pureed/blenderized diet for a few days until you feel better and no longer constipated. Be sure to drink plenty of fluids every day to avoid getting dehydrated (feeling dizzy, not urinating, etc.). Gradually add a fiber supplement to your diet over the next week.  Gradually get back to a regular solid diet.  Avoid fast food or heavy meals the first week as you are more likely to get nauseated. It is expected for your digestive tract to need a few months to get back to normal.  It is common for your bowel movements and stools to be irregular.  You will have occasional bloating and cramping that should eventually fade away.  Until you are eating solid food normally, off all pain medications, and back to regular activities; your bowels will not be normal. Focus on eating a low-fat, high fiber diet the rest of your life  (See Getting to [place], below).  CARE of your INCISION or WOUND It is good for closed incision and even open wounds to be washed every day.  Shower every day.  Short baths are fine.  Wash the incisions and wounds clean with soap & water.     If you have a closed incision(s), wash the incision with soap & water every day.  You may leave closed incisions open to air if it is dry.   You may cover the incision with clean gauze & replace it after your daily shower for comfort.  It is good for closed incisions and even open wounds to be washed every day.  Shower every day.  Short baths are fine.  Wash the incisions and wounds clean with soap & water.    You may leave closed incisions open to air if it is dry.   You may cover the incision with clean gauze & replace it after your daily shower for comfort.  TEGADERM:  You have clear gauze band-aid dressings over your closed incision(s).  Remove the dressings 3 days after surgery. = LABOR DAY 9/5   If you have an open wound with a wound vac, see wound vac care instructions.     ACTIVITIES as tolerated Start light daily activities --- self-care, walking, climbing stairs-- beginning the day after surgery.  Gradually increase activities as tolerated.  Control your pain to be active.  Stop when you are tired.  Ideally, walk several times a day, eventually an hour a day.   Most people are back to  most day-to-day activities in a few weeks.  It takes 4-8 weeks to get back to unrestricted, intense activity. If you can walk 30 minutes without difficulty, it is safe to try more intense activity such as jogging, treadmill, bicycling, low-impact aerobics, swimming, etc. Save the most intensive and strenuous activity for last (Usually 4-8 weeks after surgery) such as sit-ups, heavy lifting, contact sports, etc.  Refrain from any intense heavy lifting or straining until you are off narcotics for pain control.  You will have off days, but things should  improve week-by-week. DO NOT PUSH THROUGH PAIN.  Let pain be your guide: If it hurts to do something, don't do it.  Pain is your body warning you to avoid that activity for another week until the pain goes down. You may drive when you are no longer taking narcotic prescription pain medication, you can comfortably wear a seatbelt, and you can safely make sudden turns/stops to protect yourself without hesitating due to pain. You may have sexual intercourse when it is comfortable. If it hurts to do something, stop.  MEDICATIONS Take your usually prescribed home medications unless otherwise directed.   Blood thinners:  Usually you can restart any strong blood thinners after the second postoperative day.  It is OK to take aspirin right away.     If you are on strong blood thinners (warfarin/Coumadin, Plavix, Xerelto, Eliquis, Pradaxa, etc), discuss with your surgeon, medicine PCP, and/or cardiologist for instructions on when to restart the blood thinner & if blood monitoring is needed (PT/INR blood check, etc).     PAIN CONTROL Pain after surgery or related to activity is often due to strain/injury to muscle, tendon, nerves and/or incisions.  This pain is usually short-term and will improve in a few months.  To help speed the process of healing and to get back to regular activity more quickly, DO THE FOLLOWING THINGS TOGETHER: Increase activity gradually.  DO NOT PUSH THROUGH PAIN Use Ice and/or Heat Try Gentle Massage and/or Stretching Take over the counter pain medication Take Narcotic prescription pain medication for more severe pain  Good pain control = faster recovery.  It is better to take more medicine to be more active than to stay in bed all day to avoid medications.  Increase activity gradually Avoid heavy lifting at first, then increase to lifting as tolerated over the next 6 weeks. Do not "push through" the pain.  Listen to your body and avoid positions and maneuvers than reproduce the  pain.  Wait a few days before trying something more intense Walking an hour a day is encouraged to help your body recover faster and more safely.  Start slowly and stop when getting sore.  If you can walk 30 minutes without stopping or pain, you can try more intense activity (running, jogging, aerobics, cycling, swimming, treadmill, sex, sports, weightlifting, etc.) Remember: If it hurts to do it, then don't do it! Use Ice and/or Heat You will have swelling and bruising around the incisions.  This will take several weeks to resolve. Ice packs or heating pads (6-8 times a day, 30-60 minutes at a time) will help sooth soreness & bruising. Some people prefer to use ice alone, heat alone, or alternate between ice & heat.  Experiment and see what works best for you.  Consider trying ice for the first few days to help decrease swelling and bruising; then, switch to heat to help relax sore spots and speed recovery. Shower every day.  Short baths are fine.  It feels good!  Keep the incisions and wounds clean with soap & water.   Try Gentle Massage and/or Stretching Massage at the area of pain many times a day Stop if you feel pain - do not overdo it Take over the counter pain medication This helps the muscle and nerve tissues become less irritable and calm down faster Choose ONE of the following over-the-counter anti-inflammatory medications: Acetaminophen '500mg'$  tabs (Tylenol) 1-2 pills with every meal and just before bedtime (avoid if you have liver problems or if you have acetaminophen in you narcotic prescription) Naproxen '220mg'$  tabs (ex. Aleve, Naprosyn) 1-2 pills twice a day (avoid if you have kidney, stomach, IBD, or bleeding problems) Ibuprofen '200mg'$  tabs (ex. Advil, Motrin) 3-4 pills with every meal and just before bedtime (avoid if you have kidney, stomach, IBD, or bleeding problems) Take with food/snack several times a day as directed for at least 2 weeks to help keep pain / soreness down & more  manageable. Take Narcotic prescription pain medication for more severe pain A prescription for strong pain control is often given to you upon discharge (for example: oxycodone/Percocet, hydrocodone/Norco/Vicodin, or tramadol/Ultram) Take your pain medication as prescribed. Be mindful that most narcotic prescriptions contain Tylenol (acetaminophen) as well - avoid taking too much Tylenol. If you are having problems/concerns with the prescription medicine (does not control pain, nausea, vomiting, rash, itching, etc.), please call us [phone] to see if we need to switch you to a different pain medicine that will work better for you and/or control your side effects better. If you need a refill on your pain medication, you must call the office before 4 pm and on weekdays only.  By federal law, prescriptions for narcotics cannot be called into a pharmacy.  They must be filled out on paper & picked up from our office by the patient or authorized caretaker.  Prescriptions cannot be filled after 4 pm nor on weekends.    WHEN TO CALL us [phone] Severe uncontrolled or worsening pain  Fever over 101 F (38.5 C) Concerns with the incision: Worsening pain, redness, rash/hives, swelling, bleeding, or drainage Reactions / problems with new medications (itching, rash, hives, nausea, etc.) Nausea and/or vomiting Difficulty urinating Difficulty breathing Worsening fatigue, dizziness, lightheadedness, blurred vision Other concerns If you are not getting better after two weeks or are noticing you are getting worse, contact our office [phone] for further advice.  We may need to adjust your medications, re-evaluate you in the office, send you to the emergency room, or see what other things we can do to help. The clinic staff is available to answer your questions during regular business hours (8:30am-5pm).  Please don't hesitate to call and ask to speak to one of our nurses for clinical concerns.    A  surgeon from [facility] is always on call at the hospitals 24 hours/day If you have a medical emergency, go to the nearest emergency room or call 911.  FOLLOW UP in our office One the day of your discharge from the hospital (or the next business weekday), please call [facility] to set up or confirm an appointment to see your surgeon in the office for a follow-up appointment.  Usually it is 2-3 weeks after your surgery.   If you have skin staples at your incision(s), let the office know so we can set up a time in the office for the nurse to remove them (usually around 10 days after surgery). Make sure  that you call for appointments the day of discharge (or the next business weekday) from the hospital to ensure a convenient appointment time. IF YOU HAVE DISABILITY OR FAMILY LEAVE FORMS, BRING THEM TO THE OFFICE FOR PROCESSING.  DO NOT GIVE THEM TO YOUR DOCTOR.  [facility] [address] [place], [place], [place]  [address] ? [phone] - Main [phone] - [place],  [phone] - Fax [email or web address]  GETTING TO GOOD BOWEL HEALTH. It is expected for your digestive tract to need a few months to get back to normal.  It is common for your bowel movements and stools to be irregular.  You will have occasional bloating and cramping that should eventually fade away.  Until you are eating solid food normally, off all pain medications, and back to regular activities; your bowels will not be normal.   Avoiding constipation The goal: ONE SOFT BOWEL MOVEMENT A DAY!    Drink plenty of fluids.  Choose water first. TAKE A FIBER SUPPLEMENT EVERY DAY THE REST OF YOUR LIFE During your first week back home, gradually add back a fiber supplement every day Experiment which form you can tolerate.   There are many forms such as powders, tablets, wafers, gummies, etc Psyllium bran (Metamucil), methylcellulose (Citrucel), Miralax or Glycolax,  Benefiber, Flax Seed.  Adjust the dose week-by-week (1/2 dose/day to 6 doses a day) until you are moving your bowels 1-2 times a day.  Cut back the dose or try a different fiber product if it is giving you problems such as diarrhea or bloating. Sometimes a laxative is needed to help jump-start bowels if constipated until the fiber supplement can help regulate your bowels.  If you are tolerating eating & you are farting, it is okay to try a gentle laxative such as double dose MiraLax, prune juice, or Milk of Magnesia.  Avoid using laxatives too often. Stool softeners can sometimes help counteract the constipating effects of narcotic pain medicines.  It can also cause diarrhea, so avoid using for too long. If you are still constipated despite taking fiber daily, eating solids, and a few doses of laxatives, call our office. Controlling diarrhea Try drinking liquids and eating bland foods for a few days to avoid stressing your intestines further. Avoid dairy products (especially milk & ice cream) for a short time.  The intestines often can lose the ability to digest lactose when stressed. Avoid foods that cause gassiness or bloating.  Typical foods include beans and other legumes, cabbage, broccoli, and dairy foods.  Avoid greasy, spicy, fast foods.  Every person has some sensitivity to other foods, so listen to your body and avoid those foods that trigger problems for you. Probiotics (such as active yogurt, Align, etc) may help repopulate the intestines and colon with normal bacteria and calm down a sensitive digestive tract Adding a fiber supplement gradually can help thicken stools by absorbing excess fluid and retrain the intestines to act more normally.  Slowly increase the dose over a few weeks.  Too much fiber too soon can backfire and cause cramping & bloating. It is okay to try and slow down diarrhea with a few doses of antidiarrheal medicines.   Bismuth subsalicylate (ex. Kayopectate, Pepto Bismol)  for a few doses can help control diarrhea.  Avoid if pregnant.   Loperamide (Imodium) can slow down diarrhea.  Start with one tablet ('2mg'$ ) first.  Avoid if you are having fevers or severe pain.  ILEOSTOMY PATIENTS WILL HAVE CHRONIC DIARRHEA  since their colon is not in use.    Drink plenty of liquids.  You will need to drink even more glasses of water/liquid a day to avoid getting dehydrated. Record output from your ileostomy.  Expect to empty the bag every 3-4 hours at first.  Most people with a permanent ileostomy empty their bag 4-6 times at the least.   Use antidiarrheal medicine (especially Imodium) several times a day to avoid getting dehydrated.  Start with a dose at bedtime & breakfast.  Adjust up or down as needed.  Increase antidiarrheal medications as directed to avoid emptying the bag more than 8 times a day (every 3 hours). Work with your wound ostomy nurse to learn care for your ostomy.  See ostomy care instructions. TROUBLESHOOTING IRREGULAR BOWELS 1) Start with a soft & bland diet. No spicy, greasy, or fried foods.  2) Avoid gluten/wheat or dairy products from diet to see if symptoms improve. 3) Miralax 17gm or flax seed mixed in [place]. water or juice-daily. May use 2-4 times a day as needed. 4) Gas-X, Phazyme, etc. as needed for gas & bloating.  5) Prilosec (omeprazole) over-the-counter as needed 6)  Consider probiotics (Align, Activa, etc) to help calm the bowels down  Call your doctor if you are getting worse or not getting better.  Sometimes further testing (cultures, endoscopy, X-ray studies, CT scans, bloodwork, etc.) may be needed to help diagnose and treat the cause of the diarrhea. [facility], [place], [place], [place], [place]  [address] [phone] - Main.    [phone]  - Toll Free.   [phone] - Fax [email or web address]

## 2021-03-18 NOTE — Progress Notes (Signed)
Reviewed written d/c instructions w pt and all questions answered. She verbalized understanding. D/C per w/c w all belongings in stable condition. 

## 2021-03-18 NOTE — Discharge Summary (Signed)
Physician Discharge Summary    Patient ID: Amy Reyes MRN: [id number] DOB/AGE: 1951/02/26  70 y.o.  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  Not able to removed by appendectomy/partial cecectomy.  Therefore proximal right colectomy done.  No obvious peritoneal disease.  Lap adjustable gastric banding with tubing exiting along the angle of His and greater curvature.  Able to be removed.  Confirmed all pieces removed and intact.  No evidence of any erosion   CASE DATA:   Type of patient?: Elective [facility] Private Case   Status of Case? Elective Scheduled   Infection Present At Time Of Surgery (PATOS)?  NO   No obvious metastatic disease on visceral parietal peritoneum or liver.  It is an isoperistaltic ileocolonic anastomosis that rests in the right mid-abdomen.  Consults: None  Hospital Course:   The patient underwent the surgery above.  Postoperatively, the patient gradually mobilized and advanced to a solid diet.  Pain and other symptoms were treated  aggressively.    By the time of discharge, the patient was walking well the hallways, eating food, having flatus.  Pain was well-controlled on an oral medications.  Based on meeting discharge criteria and continuing to recover, I felt it was safe for the patient to be discharged from the hospital to further recover with close followup. Postoperative recommendations were discussed in detail.  They are written as well.  Discharged Condition: good  Discharge Exam: Blood pressure 136/64, pulse 60, temperature 97.9 F (36.6 C), temperature source Oral, resp. rate 16, height '5\' 6"'$  (1.676 m), weight 97.9 kg, SpO2 100 %.  General: Pt awake/alert/oriented x4 in No acute distress Eyes: PERRL, normal EOM.  Sclera clear.  No icterus Neuro: CN II-XII intact w/o focal sensory/motor deficits. Lymph: No head/neck/groin lymphadenopathy Psych:  No delerium/psychosis/paranoia HENT: Normocephalic, Mucus membranes moist.  No thrush Neck: Supple, No tracheal deviation Chest:  No chest wall pain w good excursion CV:  Pulses intact.  Regular rhythm MS: Normal AROM mjr joints.  No obvious deformity Abdomen: Soft.  Nondistended.  Mildly tender at incisions only.  No evidence of peritonitis.  No incarcerated hernias. Ext:  SCDs BLE.  No mjr edema.  No cyanosis Skin: No petechiae / purpura   Disposition:    Follow-up Information     Michael Boston, MD Follow up in 3 week(s).   Specialties: General Surgery, Colon and Rectal Surgery Contact information: [address] [place] [address] [phone]                 Discharge disposition: 01-Home or Self Care  Discharge Instructions     Call MD for:   Complete by: As directed    FEVER > 101.5 F  (temperatures < 101.5 F are not significant)   Call MD for:   Complete by: As directed    FEVER > 101.5 F  (temperatures < 101.5 F are not significant)   Call MD for:  extreme fatigue   Complete by: As directed    Call MD  for:  extreme fatigue   Complete by: As directed    Call MD for:  persistant dizziness or light-headedness   Complete by: As directed    Call MD for:  persistant dizziness or light-headedness   Complete by: As directed    Call MD for:  persistant nausea and vomiting   Complete by: As directed    Call MD for:  persistant nausea and vomiting   Complete by: As directed    Call MD for:  redness, tenderness, or signs of infection (pain, swelling, redness, odor or green/yellow discharge around incision site)   Complete by: As directed    Call MD for:  redness, tenderness, or signs of infection (pain, swelling, redness, odor or green/yellow discharge around incision site)   Complete by: As directed    Call MD for:  severe uncontrolled pain   Complete by: As directed    Call MD for:  severe uncontrolled pain   Complete by: As directed    Diet - low sodium heart healthy   Complete by: As directed    Start with a bland diet such as soups, liquids, starchy foods, low fat foods, etc. the first few days at home. Gradually advance to a solid, low-fat, high fiber diet by the end of the first week at home.   Add a fiber supplement to your diet (Metamucil, etc) If you feel full, bloated, or constipated, stay on a full liquid or pureed/blenderized diet for a few days until you feel better and are no longer constipated.   Discharge instructions   Complete by: As directed    See Discharge Instructions If you are not getting better after two weeks or are noticing you are getting worse, contact our office [phone] for further advice.  We may need to adjust your medications, re-evaluate you in the office, send you to the emergency room, or see what other things we can do to help. The clinic staff is available to answer your questions during regular business hours (8:30am-5pm).  Please don't hesitate to call and ask to speak to one of our nurses for clinical concerns.    A surgeon from [facility] is always on call at the hospitals 24 hours/day If you have a medical emergency, go to the nearest emergency room or call 911.   Discharge instructions   Complete by: As directed    See Discharge Instructions If you are not getting better after two weeks or are noticing you are getting worse, contact our office [phone] for further advice.  We may need to adjust your medications, re-evaluate you in the office, send you to the emergency room, or see what other things we can do to help. The clinic staff is available to answer your questions during regular business hours (8:30am-5pm).  Please don't hesitate to call and ask to speak to one of our nurses for clinical concerns.    A surgeon from [facility] is always on call at the hospitals 24 hours/day If you have a medical emergency,  go to the nearest emergency room or call 911.   Discharge wound care:   Complete by: As directed    '@[email or web address]'$ @   Discharge wound care:   Complete by: As directed    It is good for closed incisions and even open wounds to be washed every day.  Shower every day.  Short baths are fine.  Wash the incisions and wounds clean with soap & water.    You may leave closed incisions open to air if it is dry.   You may cover the incision with clean gauze & replace it after your daily shower for comfort.  TEGADERM:  You have clear gauze band-aid dressings over your closed incision(s).  Remove the dressings 3 days after surgery = 9/5.  Labor Day   Driving Restrictions   Complete by: As directed    You may drive when: - you are no longer taking narcotic prescription pain medication - you can comfortably wear a seatbelt - you can safely make sudden turns/stops without pain.   Driving Restrictions   Complete by: As directed    You may drive when: - you are no longer taking narcotic prescription pain medication - you can comfortably wear a seatbelt - you can safely make sudden turns/stops  without pain.   Increase activity slowly   Complete by: As directed    Start light daily activities --- self-care, walking, climbing stairs- beginning the day after surgery.  Gradually increase activities as tolerated.  Control your pain to be active.  Stop when you are tired.  Ideally, walk several times a day, eventually an hour a day.   Most people are back to most day-to-day activities in a few weeks.  It takes 4-6 weeks to get back to unrestricted, intense activity. If you can walk 30 minutes without difficulty, it is safe to try more intense activity such as jogging, treadmill, bicycling, low-impact aerobics, swimming, etc. Save the most intensive and strenuous activity for last (Usually 4-8 weeks after surgery) such as sit-ups, heavy lifting, contact sports, etc.  Refrain from any intense heavy lifting or straining until you are off narcotics for pain control.  You will have off days, but things should improve week-by-week. DO NOT PUSH THROUGH PAIN.  Let pain be your guide: If it hurts to do something, don't do it.   Increase activity slowly   Complete by: As directed    Start light daily activities --- self-care, walking, climbing stairs- beginning the day after surgery.  Gradually increase activities as tolerated.  Control your pain to be active.  Stop when you are tired.  Ideally, walk several times a day, eventually an hour a day.   Most people are back to most day-to-day activities in a few weeks.  It takes 4-6 weeks to get back to unrestricted, intense activity. If you can walk 30 minutes without difficulty, it is safe to try more intense activity such as jogging, treadmill, bicycling, low-impact aerobics, swimming, etc. Save the most intensive and strenuous activity for last (Usually 4-8 weeks after surgery) such as sit-ups, heavy lifting, contact sports, etc.  Refrain from any intense heavy lifting or straining until you are off narcotics for pain control.  You will have off days, but  things should improve week-by-week. DO NOT PUSH THROUGH PAIN.  Let pain be your guide: If it hurts to do something, don't do it.   Lifting restrictions   Complete by: As directed    If you can walk 30 minutes  without difficulty, it is safe to try more intense activity such as jogging, treadmill, bicycling, low-impact aerobics, swimming, etc. Save the most intensive and strenuous activity for last (Usually 4-8 weeks after surgery) such as sit-ups, heavy lifting, contact sports, etc.   Refrain from any intense heavy lifting or straining until you are off narcotics for pain control.  You will have off days, but things should improve week-by-week. DO NOT PUSH THROUGH PAIN.  Let pain be your guide: If it hurts to do something, don't do it.  Pain is your body warning you to avoid that activity for another week until the pain goes down.   Lifting restrictions   Complete by: As directed    If you can walk 30 minutes without difficulty, it is safe to try more intense activity such as jogging, treadmill, bicycling, low-impact aerobics, swimming, etc. Save the most intensive and strenuous activity for last (Usually 4-8 weeks after surgery) such as sit-ups, heavy lifting, contact sports, etc.   Refrain from any intense heavy lifting or straining until you are off narcotics for pain control.  You will have off days, but things should improve week-by-week. DO NOT PUSH THROUGH PAIN.  Let pain be your guide: If it hurts to do something, don't do it.  Pain is your body warning you to avoid that activity for another week until the pain goes down.   May shower / Bathe   Complete by: As directed    May shower / Bathe   Complete by: As directed    May walk up steps   Complete by: As directed    May walk up steps   Complete by: As directed    Remove dressing in 72 hours   Complete by: As directed    Make sure all dressings are removed by the third day after surgery.  Leave incisions open to air.  OK to cover  incisions with gauze or bandages as desired   Remove dressing in 72 hours   Complete by: As directed    Make sure all dressings are removed by the third day after surgery.  Leave incisions open to air.  OK to cover incisions with gauze or bandages as desired   Sexual Activity Restrictions   Complete by: As directed    You may have sexual intercourse when it is comfortable. If it hurts to do something, stop.   Sexual Activity Restrictions   Complete by: As directed    You may have sexual intercourse when it is comfortable. If it hurts to do something, stop.       Allergies as of 03/18/2021       Reactions   Tramadol Itching   Codeine    Does not like the side effects-post taking        Medication List     STOP taking these medications    cyclobenzaprine 10 MG tablet Commonly known as: FLEXERIL   omeprazole 40 MG capsule Commonly known as: PRILOSEC       TAKE these medications    albuterol 108 (90 Base) MCG/ACT inhaler Commonly known as: VENTOLIN HFA Inhale 1-2 puffs into the lungs every 6 (six) hours as needed for wheezing or shortness of breath.   atorvastatin 80 MG tablet Commonly known as: LIPITOR Take 80 mg by mouth in the morning.   losartan-hydrochlorothiazide 100-25 MG tablet Commonly known as: HYZAAR Take 1 tablet by mouth in the morning.   ondansetron 4 MG tablet Commonly known as: ZOFRAN Take  1 tablet (4 mg total) by mouth every 6 (six) hours as needed for nausea or vomiting (Use 1st for nausea or vomiting).   oxyCODONE 5 MG immediate release tablet Commonly known as: Oxy IR/ROXICODONE Take 1-2 tablets (5-10 mg total) by mouth every 6 (six) hours as needed for moderate pain, severe pain or breakthrough pain.   Ozempic (1 MG/DOSE) 4 MG/3ML Sopn Generic drug: Semaglutide (1 MG/DOSE) Inject 1 mg into the skin once a week.   Slow Iron 160 (50 Fe) MG Tbcr SR tablet Generic drug: ferrous sulfate Take 1 tablet by mouth in the morning.   vitamin C  250 MG tablet Commonly known as: ASCORBIC ACID Take 750 mg by mouth in the morning.   Vitamin D3 50 MCG (2000 UT) Tabs Take 6,000 Units by mouth in the morning.               Discharge Care Instructions  (From admission, onward)           Start     Ordered   03/18/21 0000  Discharge wound care:       Comments: It is good for closed incisions and even open wounds to be washed every day.  Shower every day.  Short baths are fine.  Wash the incisions and wounds clean with soap & water.    You may leave closed incisions open to air if it is dry.   You may cover the incision with clean gauze & replace it after your daily shower for comfort.  TEGADERM:  You have clear gauze band-aid dressings over your closed incision(s).  Remove the dressings 3 days after surgery = 9/5.  Labor Day   03/18/21 1001   03/16/21 0000  Discharge wound care:       Comments: '@[email or web address]'$ @   03/16/21 1032            Significant Diagnostic Studies:  Results for orders placed or performed during the hospital encounter of 03/16/21 (from the past 72 hour(s))  Glucose, capillary     Status: None   Collection Time: 03/16/21  9:38 AM  Result Value Ref Range   Glucose-Capillary 95 70 - 99 mg/dL    Comment: Glucose reference range applies only to samples taken after fasting for at least 8 hours.  Glucose, capillary     Status: Abnormal   Collection Time: 03/16/21  2:35 PM  Result Value Ref Range   Glucose-Capillary 152 (H) 70 - 99 mg/dL    Comment: Glucose reference range applies only to samples taken after fasting for at least 8 hours.  Basic metabolic panel     Status: Abnormal   Collection Time: 03/17/21  4:48 AM  Result Value Ref Range   Sodium 133 (L) 135 - 145 mmol/L   Potassium 4.2 3.5 - 5.1 mmol/L   Chloride 100 98 - 111 mmol/L   CO2 26 22 - 32 mmol/L   Glucose, Bld 110 (H) 70 - 99 mg/dL    Comment: Glucose reference range applies only to samples taken after fasting for at  least 8 hours.   BUN 27 (H) 8 - 23 mg/dL   Creatinine, Ser 1.78 (H) 0.44 - 1.00 mg/dL   Calcium 8.5 (L) 8.9 - 10.3 mg/dL   GFR, Estimated 30 (L) >60 mL/min    Comment: (NOTE) Calculated using the CKD-EPI Creatinine Equation (2021)    Anion gap 7 5 - 15    Comment: Performed at [facility], [address]  [place], [place] [address]  CBC     Status: Abnormal   Collection Time: 03/17/21  4:48 AM  Result Value Ref Range   WBC 8.8 4.0 - 10.5 K/uL   RBC 3.63 (L) 3.87 - 5.11 MIL/uL   Hemoglobin 9.7 (L) 12.0 - 15.0 g/dL   HCT 30.4 (L) 36.0 - 46.0 %   MCV 83.7 80.0 - 100.0 fL   MCH 26.7 26.0 - 34.0 pg   MCHC 31.9 30.0 - 36.0 g/dL   RDW 15.1 11.5 - 15.5 %   Platelets 240 150 - 400 K/uL   nRBC 0.0 0.0 - 0.2 %    Comment: Performed at [facility], [place] [address], [place], [place] [address]  Magnesium     Status: Abnormal   Collection Time: 03/17/21  4:48 AM  Result Value Ref Range   Magnesium 1.5 (L) 1.7 - 2.4 mg/dL    Comment: Performed at [facility], [place] [address], [place], [place] [address]    MM 3D SCREEN BREAST BILATERAL  Result Date: 03/07/2021 CLINICAL DATA:  Screening. EXAM: DIGITAL SCREENING BILATERAL MAMMOGRAM WITH TOMOSYNTHESIS AND CAD TECHNIQUE: Bilateral screening digital craniocaudal and mediolateral oblique mammograms were obtained. Bilateral screening digital breast tomosynthesis was performed. The images were evaluated with computer-aided detection. COMPARISON:  None. ACR Breast Density Category b: There are scattered areas of fibroglandular density. FINDINGS: In the left breast, possible masses warrant further evaluation. In the right breast, no findings suspicious for malignancy. IMPRESSION: Further evaluation is suggested for possible masses in the left breast. RECOMMENDATION: Diagnostic mammogram and possibly ultrasound of the left breast. (Code:[id number]-62M) The patient will be contacted regarding the findings, and  additional imaging will be scheduled. BI-RADS CATEGORY  0: Incomplete. Need additional imaging evaluation and/or prior mammograms for comparison. Electronically Signed   By: Lajean Manes M.D.   On: 03/07/2021 10:30    Past Medical History:  Diagnosis Date   Allergy    seasonal allergies   Anemia    Arthritis    oa   Bronchitis hx of   Chronic kidney disease    ckd stage 2- 3 due to NSAIDS use no nephrologist   Colon polyps    Complication of anesthesia    slow to wake up one time, none recent   Diabetes mellitus without complication ([place])    borderline type 2 on metformin   History of carpal tunnel syndrome    years ago   History of kidney stones    History of rotator cuff tear    Bilateral   Hypercholesteremia    on meds   Hypertension    on meds   Peptic ulcer    PMB (postmenopausal bleeding)    last time middle of april 2021    Past Surgical History:  Procedure Laterality Date   ABDOMINOPLASTY  30 yrs ago   tummy tuck   BREAST SURGERY     reduction   CHOLECYSTECTOMY  2000   laparoscopic   COLONOSCOPY  2019   TA   COLONOSCOPY WITH PROPOFOL N/A 12/13/2020   Procedure: COLONOSCOPY WITH PROPOFOL;  Surgeon: Harvel Quale, MD;  [facility];  Service: Gastroenterology;  Laterality: N/A;  11:00   DILATATION & CURETTAGE/HYSTEROSCOPY WITH MYOSURE N/A 12/20/2019   Procedure: DILATATION & CURETTAGE/HYSTEROSCOPY WITH MYOSURE RESECTION OF POLYP;  Surgeon: Megan Salon, MD;  Location: [facility];  Service: Gynecology;  Laterality: N/A;  resection of polyp,  to follow first case  INJECTION KNEE Left 10/14/2016   Procedure: CORTISONE LEFT KNEE INJECTION;  Surgeon: Gaynelle Arabian, MD;  [facility];  Service: Orthopedics;  Laterality: Left;   LAPAROSCOPIC GASTRIC BANDING  2010 or 2011   left knee injection  6 weeks ago   POLYPECTOMY     TA   POLYPECTOMY  12/13/2020   Procedure: POLYPECTOMY;  Surgeon: Harvel Quale, MD;   [facility];  Service: Gastroenterology;;  ascending colon   REDUCTION MAMMAPLASTY     ROTATOR CUFF REPAIR Left 05/2019   TOTAL KNEE ARTHROPLASTY Right 10/14/2016   Procedure: RIGHT TOTAL KNEE ARTHROPLASTY;  Surgeon: Gaynelle Arabian, MD;  [facility];  Service: Orthopedics;  Laterality: Right;  Adductor Block   TOTAL KNEE ARTHROPLASTY Left 04/24/2020   Procedure: TOTAL KNEE ARTHROPLASTY;  Surgeon: Gaynelle Arabian, MD;  [facility];  Service: Orthopedics;  Laterality: Left;  64mn    Social History   Socioeconomic History   Marital status: Widowed    Spouse name: 2   Number of children: Not on file   Years of education: Not on file   Highest education level: Not on file  Occupational History   Occupation: retired  Tobacco Use   Smoking status: Former    Years: 1.00    Types: Cigarettes    Quit date: 05/16/1983    Years since quitting: 37.8   Smokeless tobacco: Never  Vaping Use   Vaping Use: Never used  Substance and Sexual Activity   Alcohol use: Never    Alcohol/week: 2.0 standard drinks    Types: 2 Glasses of wine per week   Drug use: No   Sexual activity: Yes    Birth control/protection: Post-menopausal    Comment: rare  Other Topics Concern   Not on file  Social History Narrative   Not on file   Social Determinants of Health   Financial Resource Strain: Not on file  Food Insecurity: Not on file  Transportation Needs: Not on file  Physical Activity: Not on file  Stress: Not on file  Social Connections: Not on file  Intimate Partner Violence: Not on file    Family History  Problem Relation Age of Onset   Hypertension Mother    Thyroid disease Mother    Kidney disease Sister    Hypertension Sister    Diabetes Maternal Grandfather    Diabetes Maternal Aunt    Diabetes Maternal Uncle    Prostate cancer Father 668  Colon cancer Father 635  Colon polyps Father 645  Hypertension Brother    Uterine cancer Maternal Aunt    Rectal cancer  Neg Hx    Stomach cancer Neg Hx     Current Facility-Administered Medications  Medication Dose Route Frequency Provider Last Rate Last Admin   0.9 %  sodium chloride infusion  250 mL Intravenous PRN GMichael Boston MD       0.9 %  sodium chloride infusion  250 mL Intravenous PRN Javontae Marlette, SRemo Lipps MD       acetaminophen (TYLENOL) tablet 1,000 mg  1,000 mg Oral Q6H GMichael Boston MD   1,000 mg at 03/18/21 0602   albuterol (PROVENTIL) (2.5 MG/3ML) 0.083% nebulizer solution 3 mL  3 mL Inhalation Q6H PRN GMichael Boston MD       alum & mag hydroxide-simeth (MAALOX/MYLANTA) 200-200-20 MG/5ML suspension 30 mL  30 mL Oral Q6H PRN GMichael Boston MD       alvimopan (ENTEREG) capsule 12 mg  12 mg Oral  BID Michael Boston, MD       ascorbic acid (VITAMIN C) tablet 750 mg  750 mg Oral q AM Michael Boston, MD       atorvastatin (LIPITOR) tablet 80 mg  80 mg Oral q AM Michael Boston, MD       cholecalciferol (VITAMIN D3) tablet 6,000 Units  6,000 Units Oral q AM Michael Boston, MD       diphenhydrAMINE (BENADRYL) 12.5 MG/5ML elixir 12.5 mg  12.5 mg Oral Q6H PRN Michael Boston, MD       Or   diphenhydrAMINE (BENADRYL) injection 12.5 mg  12.5 mg Intravenous Q6H PRN Michael Boston, MD       enalaprilat (VASOTEC) injection 0.625-1.25 mg  0.625-1.25 mg Intravenous Q6H PRN Michael Boston, MD       enoxaparin (LOVENOX) injection 40 mg  40 mg Subcutaneous Q24H Michael Boston, MD   40 mg at 03/17/21 [id number]   feeding supplement (ENSURE SURGERY) liquid 237 mL  237 mL Oral BID BM Michael Boston, MD       losartan (COZAAR) tablet 50-100 mg  50-100 mg Oral q AM Michael Boston, MD       And   hydrochlorothiazide (MICROZIDE) capsule 12.5-25 mg  12.5-25 mg Oral q AM Michael Boston, MD       HYDROmorphone (DILAUDID) injection 0.5-2 mg  0.5-2 mg Intravenous Q4H PRN Michael Boston, MD       lactated ringers bolus 1,000 mL  1,000 mL Intravenous Q8H PRN Michael Boston, MD       lip balm (CARMEX) ointment 1 application  1 application Topical BID  Michael Boston, MD   1 application at [id number]   magic mouthwash  15 mL Oral QID PRN Michael Boston, MD       melatonin tablet 3 mg  3 mg Oral QHS PRN Michael Boston, MD       metoprolol tartrate (LOPRESSOR) injection 5 mg  5 mg Intravenous Q6H PRN Michael Boston, MD       ondansetron [facility]) tablet 4 mg  4 mg Oral Q6H PRN Michael Boston, MD       Or   ondansetron [facility]) injection 4 mg  4 mg Intravenous Q6H PRN Michael Boston, MD       oxyCODONE (Oxy IR/ROXICODONE) immediate release tablet 5-10 mg  5-10 mg Oral Q4H PRN Michael Boston, MD   5 mg at 03/17/21 2138   prochlorperazine (COMPAZINE) tablet 10 mg  10 mg Oral Q6H PRN Michael Boston, MD       Or   prochlorperazine (COMPAZINE) injection 5-10 mg  5-10 mg Intravenous Q6H PRN Michael Boston, MD       simethicone (MYLICON) chewable tablet 40 mg  40 mg Oral Q6H PRN Michael Boston, MD       sodium chloride flush (NS) 0.9 % injection 3 mL  3 mL Intravenous Gorden Harms, MD   3 mL at 03/17/21 2137   sodium chloride flush (NS) 0.9 % injection 3 mL  3 mL Intravenous PRN Michael Boston, MD       sodium chloride flush (NS) 0.9 % injection 3 mL  3 mL Intravenous Gorden Harms, MD       sodium chloride flush (NS) 0.9 % injection 3 mL  3 mL Intravenous PRN Michael Boston, MD         Allergies  Allergen Reactions   Tramadol Itching   Codeine     Does not like the side effects-post taking  Signed: Morton Peters, MD, FACS, MASCRS Esophageal, Gastrointestinal & Colorectal Surgery Robotic and Minimally Invasive Surgery  [facility], [place]  [id number] [place], [place] [address] [phone] Fax [phone] [address]  CONTACT INFORMATION:  Weekday (9AM-5PM): Call [facility] main office at [phone]  Weeknight (5PM-9AM) or Weekend/Holiday: Check [email or web address] (password " [email or web address]") for General Surgery [facility] coverage  (Please, do not use SecureChat as it  is not reliable communication to operating surgeons for immediate patient care)      03/18/2021, 10:02 AM

## 2021-03-18 NOTE — Plan of Care (Signed)

## 2021-03-19 ENCOUNTER — Other Ambulatory Visit (HOSPITAL_COMMUNITY): Payer: Self-pay

## 2021-03-20 ENCOUNTER — Other Ambulatory Visit (HOSPITAL_COMMUNITY): Payer: Self-pay

## 2021-03-20 LAB — SURGICAL PATHOLOGY

## 2021-03-28 ENCOUNTER — Ambulatory Visit
Admission: RE | Admit: 2021-03-28 | Discharge: 2021-03-28 | Disposition: A | Discharge status: Home / Self Care | Payer: Medicare HMO | Source: Ambulatory Visit | Attending: Internal Medicine | Admitting: Internal Medicine

## 2021-03-28 ENCOUNTER — Other Ambulatory Visit: Payer: Self-pay

## 2021-03-28 DIAGNOSIS — R928 Other abnormal and inconclusive findings on diagnostic imaging of breast: Secondary | ICD-10-CM

## 2021-03-28 DIAGNOSIS — R922 Inconclusive mammogram: Secondary | ICD-10-CM | POA: Diagnosis not present

## 2021-04-12 ENCOUNTER — Telehealth (INDEPENDENT_AMBULATORY_CARE_PROVIDER_SITE_OTHER): Payer: Self-pay | Admitting: Gastroenterology

## 2021-04-12 NOTE — Telephone Encounter (Signed)
I received the note from Dr. Johney Maine regarding the follow-up appointment after removal of her adjustable gastric band and then appendectomy with segmental colectomy (was found to have a sessile serrated polyp within the appendix, as well as the presence of a 1.2 cm tubular adenoma in the proximal ascending colon in close proximity to the terminal ileum. Ann, can you please update the follow up colonoscopy to be performed in 3 years?  Thanks,  Maylon Peppers, MD Gastroenterology and Hepatology [facility]

## 2021-04-12 NOTE — Telephone Encounter (Signed)
TCS changed in recall for 3 years

## 2021-04-17 ENCOUNTER — Ambulatory Visit (HOSPITAL_BASED_OUTPATIENT_CLINIC_OR_DEPARTMENT_OTHER): Payer: Medicare HMO | Admitting: Obstetrics & Gynecology

## 2021-04-25 ENCOUNTER — Ambulatory Visit (HOSPITAL_BASED_OUTPATIENT_CLINIC_OR_DEPARTMENT_OTHER): Payer: Medicare HMO | Admitting: Obstetrics & Gynecology

## 2021-05-15 ENCOUNTER — Ambulatory Visit (INDEPENDENT_AMBULATORY_CARE_PROVIDER_SITE_OTHER): Payer: Medicare HMO | Admitting: Gastroenterology

## 2021-05-25 DIAGNOSIS — Z96652 Presence of left artificial knee joint: Secondary | ICD-10-CM | POA: Diagnosis not present

## 2021-05-25 DIAGNOSIS — M5459 Other low back pain: Secondary | ICD-10-CM | POA: Diagnosis not present

## 2021-06-01 ENCOUNTER — Ambulatory Visit (HOSPITAL_BASED_OUTPATIENT_CLINIC_OR_DEPARTMENT_OTHER): Payer: Medicare HMO | Admitting: Obstetrics & Gynecology

## 2021-06-02 ENCOUNTER — Emergency Department (HOSPITAL_COMMUNITY)
Admission: EM | Admit: 2021-06-02 | Discharge: 2021-06-02 | Disposition: A | Discharge status: Home / Self Care | Payer: Medicare HMO | Attending: Emergency Medicine | Admitting: Emergency Medicine

## 2021-06-02 ENCOUNTER — Encounter (HOSPITAL_COMMUNITY): Payer: Self-pay | Admitting: Emergency Medicine

## 2021-06-02 ENCOUNTER — Emergency Department (HOSPITAL_COMMUNITY): Payer: Medicare HMO

## 2021-06-02 ENCOUNTER — Other Ambulatory Visit: Payer: Self-pay

## 2021-06-02 DIAGNOSIS — Z96653 Presence of artificial knee joint, bilateral: Secondary | ICD-10-CM | POA: Insufficient documentation

## 2021-06-02 DIAGNOSIS — M542 Cervicalgia: Secondary | ICD-10-CM | POA: Insufficient documentation

## 2021-06-02 DIAGNOSIS — M436 Torticollis: Secondary | ICD-10-CM | POA: Diagnosis not present

## 2021-06-02 DIAGNOSIS — Z79899 Other long term (current) drug therapy: Secondary | ICD-10-CM | POA: Insufficient documentation

## 2021-06-02 DIAGNOSIS — E1122 Type 2 diabetes mellitus with diabetic chronic kidney disease: Secondary | ICD-10-CM | POA: Diagnosis not present

## 2021-06-02 DIAGNOSIS — Z87891 Personal history of nicotine dependence: Secondary | ICD-10-CM | POA: Diagnosis not present

## 2021-06-02 DIAGNOSIS — I129 Hypertensive chronic kidney disease with stage 1 through stage 4 chronic kidney disease, or unspecified chronic kidney disease: Secondary | ICD-10-CM | POA: Insufficient documentation

## 2021-06-02 DIAGNOSIS — N183 Chronic kidney disease, stage 3 unspecified: Secondary | ICD-10-CM | POA: Insufficient documentation

## 2021-06-02 MED ORDER — MORPHINE SULFATE (PF) 4 MG/ML IV SOLN: 4.0000 mg | Freq: Once | INTRAVENOUS | Status: DC

## 2021-06-02 MED ORDER — CYCLOBENZAPRINE HCL 10 MG PO TABS: 10.0000 mg | ORAL_TABLET | Freq: Two times a day (BID) | ORAL | 0 refills | Status: DC | PRN

## 2021-06-02 MED ORDER — KETOROLAC TROMETHAMINE 30 MG/ML IJ SOLN
30.0000 mg | Freq: Once | INTRAMUSCULAR | Status: AC
  Administered 2021-06-02: 30 mg via INTRAMUSCULAR
  Filled 2021-06-02: qty 1

## 2021-06-02 MED ORDER — ONDANSETRON 8 MG PO TBDP
8.0000 mg | ORAL_TABLET | Freq: Once | ORAL | Status: AC
  Administered 2021-06-02: 8 mg via ORAL
  Filled 2021-06-02: qty 1

## 2021-06-02 MED ORDER — MORPHINE SULFATE (PF) 4 MG/ML IV SOLN
8.0000 mg | Freq: Once | INTRAVENOUS | Status: AC
  Administered 2021-06-02: 8 mg via INTRAMUSCULAR
  Filled 2021-06-02: qty 2

## 2021-06-02 NOTE — Discharge Instructions (Addendum)
Take the Flexeril twice before bed for the next week. Follow-up with your primary care doctor in a week or so the pain is still continuing. Continue trying all the supportive measures, may just take some time for some relief. If you start having chest pain, severe headache, feeling lightheaded or dizzy please return back to the ED for additional work-up.

## 2021-06-02 NOTE — ED Triage Notes (Signed)
Pt c/o ongoing bilateral neck pain x 4 days. Pt states she thinks it was the way she slept. Pt taking OTC medications without relief.

## 2021-06-02 NOTE — ED Provider Notes (Signed)
[facility] EMERGENCY DEPARTMENT Provider Note   CSN: [id number] Arrival date & time: 06/02/21  1455     History Chief Complaint  Patient presents with   Neck Pain    Amy Reyes is a 70 y.o. female.  HPI  Patient presents with neck pain.  This started 4 to 5 days ago, happened after sleeping on it.  Is typically worse in the morning when she wakes up from resting.  The pain is bilateral, also in the posterior of her neck along her trapezius muscles.  Denies any acute injury to the neck.  She not having any dizziness or lightheadedness or headaches.  Denies any chest pain or history of CVA.  Denies any weakness or numbness.  She has tried a lot of conservative moderate measures without any improvement including Lidoderm, topical Voltaren gel, Aleve, Tylenol.  The pain is still persistent.  Past Medical History:  Diagnosis Date   Allergy    seasonal allergies   Anemia    Arthritis    oa   Bronchitis hx of   Chronic kidney disease    ckd stage 2- 3 due to NSAIDS use no nephrologist   Colon polyps    Complication of anesthesia    slow to wake up one time, none recent   Diabetes mellitus without complication ([place])    borderline type 2 on metformin   History of carpal tunnel syndrome    years ago   History of kidney stones    History of rotator cuff tear    Bilateral   Hypercholesteremia    on meds   Hypertension    on meds   Peptic ulcer    PMB (postmenopausal bleeding)    last time middle of april 2021    Patient Active Problem List   Diagnosis Date Noted   Calculus of kidney 03/17/2021   Obesity (BMI 30-39.9) 03/17/2021   History of gastric banding s/p removal 03/16/2021 03/17/2021   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 03/16/2021 03/16/2021   History of total knee replacement, left 05/02/2020   Endometrial polyp 03/19/2020   Intramural leiomyoma of uterus 11/12/2019   Hypertension    Hypercholesterolemia    History of kidney stones    Diabetes  mellitus without complication ([place])    Colon polyps    OA (osteoarthritis) of knee 10/14/2016   Osteoarthritis of right knee 10/14/2016    Past Surgical History:  Procedure Laterality Date   ABDOMINOPLASTY  30 yrs ago   tummy tuck   BREAST SURGERY     reduction   CHOLECYSTECTOMY  2000   laparoscopic   COLONOSCOPY  2019   TA   COLONOSCOPY WITH PROPOFOL N/A 12/13/2020   Procedure: COLONOSCOPY WITH PROPOFOL;  Surgeon: Harvel Quale, MD;  [facility];  Service: Gastroenterology;  Laterality: N/A;  11:00   DILATATION & CURETTAGE/HYSTEROSCOPY WITH MYOSURE N/A 12/20/2019   Procedure: DILATATION & CURETTAGE/HYSTEROSCOPY WITH MYOSURE RESECTION OF POLYP;  Surgeon: Megan Salon, MD;  Location: [place];  Service: Gynecology;  Laterality: N/A;  resection of polyp,  to follow first case   INJECTION KNEE Left 10/14/2016   Procedure: CORTISONE LEFT KNEE INJECTION;  Surgeon: Gaynelle Arabian, MD;  Location: [facility];  Service: Orthopedics;  Laterality: Left;   LAPAROSCOPIC GASTRIC BANDING  2010 or 2011   left knee injection  6 weeks ago   POLYPECTOMY     TA   POLYPECTOMY  12/13/2020   Procedure: POLYPECTOMY;  Surgeon: Montez Morita, Quillian Quince, MD;  [facility];  Service: Gastroenterology;;  ascending colon   REDUCTION MAMMAPLASTY     ROTATOR CUFF REPAIR Left 05/2019   TOTAL KNEE ARTHROPLASTY Right 10/14/2016   Procedure: RIGHT TOTAL KNEE ARTHROPLASTY;  Surgeon: Gaynelle Arabian, MD;  [facility]: [facility];  Service: Orthopedics;  Laterality: Right;  Adductor Block   TOTAL KNEE ARTHROPLASTY Left 04/24/2020   Procedure: TOTAL KNEE ARTHROPLASTY;  Surgeon: Gaynelle Arabian, MD;  [facility];  Service: Orthopedics;  Laterality: Left;  55min     OB History     Gravida  3   Para      Term      Preterm      AB  1   Living  2      SAB      IAB  1   Ectopic      Multiple      Live Births              Family History  Problem  Relation Age of Onset   Hypertension Mother    Thyroid disease Mother    Kidney disease Sister    Hypertension Sister    Diabetes Maternal Grandfather    Diabetes Maternal Aunt    Diabetes Maternal Uncle    Prostate cancer Father 22   Colon cancer Father 46   Colon polyps Father 8   Hypertension Brother    Uterine cancer Maternal Aunt    Rectal cancer Neg Hx    Stomach cancer Neg Hx     Social History   Tobacco Use   Smoking status: Former    Years: 1.00    Types: Cigarettes    Quit date: 05/16/1983    Years since quitting: 38.0   Smokeless tobacco: Never  Vaping Use   Vaping Use: Never used  Substance Use Topics   Alcohol use: Never    Alcohol/week: 2.0 standard drinks    Types: 2 Glasses of wine per week   Drug use: No    Home Medications Prior to Admission medications   Medication Sig Start Date End Date Taking? Authorizing Provider  albuterol (VENTOLIN HFA) 108 (90 Base) MCG/ACT inhaler Inhale 1-2 puffs into the lungs every 6 (six) hours as needed for wheezing or shortness of breath. 11/27/20   [provider]  atorvastatin (LIPITOR) 80 MG tablet Take 80 mg by mouth in the morning. 08/23/19   [provider]  Cholecalciferol (VITAMIN D3) 50 MCG (2000 UT) TABS Take 6,000 Units by mouth in the morning.    [provider]  ferrous sulfate (SLOW IRON) 160 (50 Fe) MG TBCR SR tablet Take 1 tablet by mouth in the morning.    [provider]  losartan-hydrochlorothiazide (HYZAAR) 100-25 MG tablet Take 1 tablet by mouth in the morning. 09/08/16   [provider]  ondansetron (ZOFRAN) 4 MG tablet Take 1 tablet (4 mg total) by mouth every 6 (six) hours as needed for nausea or vomiting (Use 1st for nausea or vomiting). 03/18/21   Michael Boston, MD  oxyCODONE (OXY IR/ROXICODONE) 5 MG immediate release tablet Take 1-2 tablets (5-10 mg total) by mouth every 6 (six) hours as needed for moderate pain, severe pain or breakthrough pain. 03/18/21    Michael Boston, MD  OZEMPIC, 1 MG/DOSE, 4 MG/3ML SOPN Inject 1 mg into the skin once a week. 02/13/21   [provider]  vitamin C (ASCORBIC ACID) 250 MG tablet Take 750 mg by  mouth in the morning.    [provider]    Allergies    Tramadol and Codeine  Review of Systems   Review of Systems  Constitutional:  Negative for chills and fever.  HENT:  Negative for ear pain and sore throat.   Eyes:  Negative for pain and visual disturbance.  Respiratory:  Negative for cough and shortness of breath.   Cardiovascular:  Negative for chest pain and palpitations.  Gastrointestinal:  Negative for abdominal pain, nausea and vomiting.  Genitourinary:  Negative for dysuria and hematuria.  Musculoskeletal:  Positive for myalgias and neck pain. Negative for arthralgias, back pain, gait problem and neck stiffness.  Skin:  Negative for color change, rash and wound.  Neurological:  Negative for dizziness, seizures, syncope, facial asymmetry, speech difficulty, weakness, light-headedness and headaches.  All other systems reviewed and are negative.  Physical Exam Updated Vital Signs BP 130/67 (BP Location: Right Arm)   Pulse 64   Temp 98.3 F (36.8 C) (Oral)   Resp 20   Ht 5\' 6"  (1.676 m)   Wt 90.3 kg   SpO2 100%   BMI 32.12 kg/m   Physical Exam Vitals and nursing note reviewed.  Constitutional:      General: She is not in acute distress.    Appearance: She is well-developed.  HENT:     Head: Normocephalic and atraumatic.  Eyes:     Conjunctiva/sclera: Conjunctivae normal.  Neck:     Comments: No meningismus diffuse tenderness over the posterior neck.  Decreased range of motion secondary to pain, no palpable drop-offs. Cardiovascular:     Rate and Rhythm: Normal rate and regular rhythm.     Heart sounds: No murmur heard. Pulmonary:     Effort: Pulmonary effort is normal. No respiratory distress.     Breath sounds: Normal breath sounds.  Abdominal:     Palpations:  Abdomen is soft.     Tenderness: There is no abdominal tenderness.  Musculoskeletal:        General: No swelling.     Cervical back: Neck supple.  Skin:    General: Skin is warm and dry.     Capillary Refill: Capillary refill takes less than 2 seconds.  Neurological:     Mental Status: She is alert.     Comments: Cranial nerves III through XII are grossly intact.  Grip strength is equal bilaterally, able to raise both lower extremities without difficulties.  Psychiatric:        Mood and Affect: Mood normal.    ED Results / Procedures / Treatments   Labs (all labs ordered are listed, but only abnormal results are displayed) Labs Reviewed - No data to display  EKG None  Radiology DG Cervical Spine Complete  Result Date: 06/02/2021 CLINICAL DATA:  Neck pain EXAM: CERVICAL SPINE - COMPLETE 4+ VIEW COMPARISON:  None. FINDINGS: There is no evidence of cervical spine fracture or prevertebral soft tissue swelling. Straightening of the cervical lordosis. Minimal grade 1 anterolisthesis C4 on C5. Mild disc height loss is most pronounced at C6-7. Oblique views demonstrate patent bony foramina bilaterally. IMPRESSION: 1. No acute findings in the cervical spine. 2. Mild degenerative disc disease most pronounced at C6-7. Electronically Signed   By: Davina Poke D.O.   On: 06/02/2021 16:53    Procedures Procedures   Medications Ordered in ED Medications  ketorolac (TORADOL) 30 MG/ML injection 30 mg (30 mg Intramuscular Given 06/02/21 1635)    ED Course  I have reviewed  the triage vital signs and the nursing notes.  Pertinent labs & imaging results that were available during my care of the patient were reviewed by me and considered in my medical decision making (see chart for details).    MDM Rules/Calculators/A&P                           Stable vitals, nontoxic-appearing.  Patient appears to be having musculoskeletal etiology.  There is no carotid bruits, no neuro symptoms.  No  focal deficits on neuro exam, denies any chest pain or exertional component of the neck pain.  It worsens when she wakes up, torticollis.  Supportive care advised.  Discussed HPI, physical exam and plan of care for this patient with attending Milton Ferguson. The attending physician evaluated this patient as part of a shared visit and agrees with plan of care.   Final Clinical Impression(s) / ED Diagnoses Final diagnoses:  None    Rx / DC Orders ED Discharge Orders     None        Sherrill Raring, [place] 06/02/21 1752    Milton Ferguson, MD 06/04/21 1026

## 2021-06-05 DIAGNOSIS — E7800 Pure hypercholesterolemia, unspecified: Secondary | ICD-10-CM | POA: Diagnosis not present

## 2021-06-05 DIAGNOSIS — I10 Essential (primary) hypertension: Secondary | ICD-10-CM | POA: Diagnosis not present

## 2021-06-05 DIAGNOSIS — E1129 Type 2 diabetes mellitus with other diabetic kidney complication: Secondary | ICD-10-CM | POA: Diagnosis not present

## 2021-06-12 DIAGNOSIS — F321 Major depressive disorder, single episode, moderate: Secondary | ICD-10-CM | POA: Diagnosis not present

## 2021-06-12 DIAGNOSIS — Z1212 Encounter for screening for malignant neoplasm of rectum: Secondary | ICD-10-CM | POA: Diagnosis not present

## 2021-06-12 DIAGNOSIS — R82998 Other abnormal findings in urine: Secondary | ICD-10-CM | POA: Diagnosis not present

## 2021-06-12 DIAGNOSIS — I10 Essential (primary) hypertension: Secondary | ICD-10-CM | POA: Diagnosis not present

## 2021-06-12 DIAGNOSIS — R809 Proteinuria, unspecified: Secondary | ICD-10-CM | POA: Diagnosis not present

## 2021-06-12 DIAGNOSIS — E1129 Type 2 diabetes mellitus with other diabetic kidney complication: Secondary | ICD-10-CM | POA: Diagnosis not present

## 2021-06-12 DIAGNOSIS — E7800 Pure hypercholesterolemia, unspecified: Secondary | ICD-10-CM | POA: Diagnosis not present

## 2021-06-12 DIAGNOSIS — N1832 Chronic kidney disease, stage 3b: Secondary | ICD-10-CM | POA: Diagnosis not present

## 2021-06-12 DIAGNOSIS — I129 Hypertensive chronic kidney disease with stage 1 through stage 4 chronic kidney disease, or unspecified chronic kidney disease: Secondary | ICD-10-CM | POA: Diagnosis not present

## 2021-06-12 DIAGNOSIS — D631 Anemia in chronic kidney disease: Secondary | ICD-10-CM | POA: Diagnosis not present

## 2021-06-12 DIAGNOSIS — Z0000 Encounter for general adult medical examination without abnormal findings: Secondary | ICD-10-CM | POA: Diagnosis not present

## 2021-06-29 ENCOUNTER — Encounter (HOSPITAL_BASED_OUTPATIENT_CLINIC_OR_DEPARTMENT_OTHER): Payer: Self-pay | Admitting: Obstetrics & Gynecology

## 2021-06-29 ENCOUNTER — Other Ambulatory Visit: Payer: Self-pay

## 2021-06-29 ENCOUNTER — Ambulatory Visit (INDEPENDENT_AMBULATORY_CARE_PROVIDER_SITE_OTHER): Payer: Medicare HMO | Admitting: Obstetrics & Gynecology

## 2021-06-29 VITALS — BP 120/66 | HR 70 | Ht 66.0 in | Wt 201.6 lb

## 2021-06-29 DIAGNOSIS — N840 Polyp of corpus uteri: Secondary | ICD-10-CM | POA: Diagnosis not present

## 2021-06-29 DIAGNOSIS — R102 Pelvic and perineal pain: Secondary | ICD-10-CM | POA: Diagnosis not present

## 2021-06-29 DIAGNOSIS — N852 Hypertrophy of uterus: Secondary | ICD-10-CM

## 2021-06-29 DIAGNOSIS — Z01419 Encounter for gynecological examination (general) (routine) without abnormal findings: Secondary | ICD-10-CM | POA: Diagnosis not present

## 2021-06-29 DIAGNOSIS — D124 Benign neoplasm of descending colon: Secondary | ICD-10-CM

## 2021-06-29 NOTE — Progress Notes (Signed)
70 y.o. [id number] Widowed Black or [place] American female here for breast and pelvic exam.  I am also following her for h/o endometrial polyp.  Is having some lower pelvic pain that occurs about every 3 months.  This does not last long.  D/w pt ultrasound.  She declines this right now.    Denies vaginal bleeding.  H/o large colon adenoma this past year.  She ended up having a partial colectomy.with appendectomy.  She has done well from surgery.  Will have follow up next year for colonoscopy.  No LMP recorded. Patient is postmenopausal.          Sexually active: No.  H/O STD:  no  Health Maintenance: PCP:  [place].  Last wellness appt was 05/2021.  Did blood work at that appt:  yes Vaccines are up to date:  no, reviewed with pt Colonoscopy:  12/13/2020 MMG:  03/02/2021 Need additional images BMD:  had this done with Dr. Osborne Casco Last pap smear:  11/05/2019 Negative.   H/o abnormal pap smear:  no    reports that she quit smoking about 38 years ago. Her smoking use included cigarettes. She has never used smokeless tobacco. She reports that she does not drink alcohol and does not use drugs.  Past Medical History:  Diagnosis Date   Allergy    seasonal allergies   Anemia    Arthritis    oa   Bronchitis hx of   Chronic kidney disease    ckd stage 2- 3 due to NSAIDS use no nephrologist   Colon polyps    Complication of anesthesia    slow to wake up one time, none recent   Diabetes mellitus without complication ([place])    borderline type 2 on metformin   History of carpal tunnel syndrome    years ago   History of kidney stones    History of rotator cuff tear    Bilateral   Hypercholesteremia    on meds   Hypertension    on meds   Peptic ulcer    PMB (postmenopausal bleeding)    last time middle of april 2021    Past Surgical History:  Procedure Laterality Date   ABDOMINOPLASTY  30 yrs ago   tummy tuck   BREAST SURGERY     reduction   CHOLECYSTECTOMY  2000   laparoscopic    COLONOSCOPY  2019   TA   COLONOSCOPY WITH PROPOFOL N/A 12/13/2020   Procedure: COLONOSCOPY WITH PROPOFOL;  Surgeon: Harvel Quale, MD;  [facility];  Service: Gastroenterology;  Laterality: N/A;  11:00   DILATATION & CURETTAGE/HYSTEROSCOPY WITH MYOSURE N/A 12/20/2019   Procedure: DILATATION & CURETTAGE/HYSTEROSCOPY WITH MYOSURE RESECTION OF POLYP;  Surgeon: Megan Salon, MD;  Location: [place];  Service: Gynecology;  Laterality: N/A;  resection of polyp,  to follow first case   INJECTION KNEE Left 10/14/2016   Procedure: CORTISONE LEFT KNEE INJECTION;  Surgeon: Gaynelle Arabian, MD;  [facility];  Service: Orthopedics;  Laterality: Left;   LAPAROSCOPIC GASTRIC BANDING  2010 or 2011   left knee injection  6 weeks ago   POLYPECTOMY     TA   POLYPECTOMY  12/13/2020   Procedure: POLYPECTOMY;  Surgeon: Harvel Quale, MD;  [facility];  Service: Gastroenterology;;  ascending colon   REDUCTION MAMMAPLASTY     ROTATOR CUFF REPAIR Left 05/2019   TOTAL KNEE ARTHROPLASTY Right 10/14/2016   Procedure: RIGHT TOTAL KNEE ARTHROPLASTY;  Surgeon:  Gaynelle Arabian, MD;  [facility];  Service: Orthopedics;  Laterality: Right;  Adductor Block   TOTAL KNEE ARTHROPLASTY Left 04/24/2020   Procedure: TOTAL KNEE ARTHROPLASTY;  Surgeon: Gaynelle Arabian, MD;  [facility];  Service: Orthopedics;  Laterality: Left;  37min    Current Outpatient Medications  Medication Sig Dispense Refill   albuterol (VENTOLIN HFA) 108 (90 Base) MCG/ACT inhaler Inhale 1-2 puffs into the lungs every 6 (six) hours as needed for wheezing or shortness of breath.     atorvastatin (LIPITOR) 80 MG tablet Take 80 mg by mouth in the morning.     Cholecalciferol (VITAMIN D3) 50 MCG (2000 UT) TABS Take 6,000 Units by mouth in the morning.     cyclobenzaprine (FLEXERIL) 10 MG tablet Take 1 tablet (10 mg total) by mouth 2 (two) times daily as needed for muscle spasms. 20  tablet 0   ferrous sulfate (SLOW IRON) 160 (50 Fe) MG TBCR SR tablet Take 1 tablet by mouth in the morning.     losartan-hydrochlorothiazide (HYZAAR) 100-25 MG tablet Take 1 tablet by mouth in the morning.     ondansetron (ZOFRAN) 4 MG tablet Take 1 tablet (4 mg total) by mouth every 6 (six) hours as needed for nausea or vomiting (Use 1st for nausea or vomiting). 6 tablet 3   OZEMPIC, 1 MG/DOSE, 4 MG/3ML SOPN Inject 1 mg into the skin once a week.     vitamin C (ASCORBIC ACID) 250 MG tablet Take 750 mg by mouth in the morning.     No current facility-administered medications for this visit.    Family History  Problem Relation Age of Onset   Hypertension Mother    Thyroid disease Mother    Kidney disease Sister    Hypertension Sister    Diabetes Maternal Grandfather    Diabetes Maternal Aunt    Diabetes Maternal Uncle    Prostate cancer Father 25   Colon cancer Father 86   Colon polyps Father 29   Hypertension Brother    Uterine cancer Maternal Aunt    Rectal cancer Neg Hx    Stomach cancer Neg Hx     Review of Systems  Genitourinary:        Possible bladder spasm   Exam:   BP 120/66 (BP Location: Right Arm, Patient Position: Sitting, Cuff Size: Large)    Pulse 70    Ht 5\' 6"  (1.676 m) Comment: reported   Wt 201 lb 9.6 oz (91.4 kg)    BMI 32.54 kg/m   Height: 5\' 6"  (167.6 cm) (reported)  General appearance: alert, cooperative and appears stated age Breasts: normal appearance, no masses or tenderness Abdomen: soft, non-tender; bowel sounds normal; no masses,  no organomegaly Lymph nodes: Cervical, supraclavicular, and axillary nodes normal.  No abnormal inguinal nodes palpated Neurologic: Grossly normal  Pelvic: External genitalia:  no lesions              Urethra:  normal appearing urethra with no masses, tenderness or lesions              Bartholins and Skenes: normal                 Vagina: normal appearing vagina with atrophic changes and no discharge, no lesions               Cervix: no lesions              Pap taken: No. Bimanual Exam:  Uterus:  enlarged,  12-14 weeks size, globular, mobile              Adnexa: normal adnexa               Rectovaginal: Confirms               Anus:  normal sphincter tone, no lesions  Chaperone, Octaviano Batty, CMA, was present for exam.  Assessment/Plan: 1. Encntr for gyn exam (general) (routine) w/o abn findings - pap 10/2019 neg - MMG 02/2021 - colonoscopy 12/2020 - BMD done with Dr. Osborne Casco - lab work done with Dr. Osborne Casco - vaccines reviewed/updated  2. Enlarged uterus - stable on exam  3. Endometrial polyp - s/p resection with hysteroscopy on 12/2019  4. Pelvic pain/possible bladder spasm - medication options reviewed.  She does not want to take anything.  She will monitor and let me know if symptoms worsen  5. Adenomatous polyp of descending colon - followed by GI

## 2021-07-17 ENCOUNTER — Encounter (INDEPENDENT_AMBULATORY_CARE_PROVIDER_SITE_OTHER): Payer: Self-pay | Admitting: *Deleted

## 2021-07-24 ENCOUNTER — Telehealth: Payer: Self-pay | Admitting: Gastroenterology

## 2021-07-24 NOTE — Telephone Encounter (Signed)
Hey Dr. Tarri Glenn,   Inbound call from patient states she would like to transfer care back to you. She saw a GI provider in [place] in 12/2020 due to not being able to get an appt with you at the time for colonoscopy. Her record are in [place]. Could you please review and advise on scheduling?  Thank you

## 2021-07-25 NOTE — Telephone Encounter (Signed)
Called patient to advise her of recommendations she understood and will try back within a few months.

## 2021-08-09 DIAGNOSIS — Z0100 Encounter for examination of eyes and vision without abnormal findings: Secondary | ICD-10-CM | POA: Diagnosis not present

## 2021-08-20 ENCOUNTER — Ambulatory Visit (INDEPENDENT_AMBULATORY_CARE_PROVIDER_SITE_OTHER): Payer: Medicare HMO | Admitting: Gastroenterology

## 2021-08-21 ENCOUNTER — Ambulatory Visit (INDEPENDENT_AMBULATORY_CARE_PROVIDER_SITE_OTHER): Payer: Medicare HMO | Admitting: Gastroenterology

## 2021-08-22 DIAGNOSIS — M9902 Segmental and somatic dysfunction of thoracic region: Secondary | ICD-10-CM | POA: Diagnosis not present

## 2021-08-22 DIAGNOSIS — M542 Cervicalgia: Secondary | ICD-10-CM | POA: Diagnosis not present

## 2021-08-22 DIAGNOSIS — M9901 Segmental and somatic dysfunction of cervical region: Secondary | ICD-10-CM | POA: Diagnosis not present

## 2021-08-22 DIAGNOSIS — M546 Pain in thoracic spine: Secondary | ICD-10-CM | POA: Diagnosis not present

## 2021-08-29 DIAGNOSIS — M9902 Segmental and somatic dysfunction of thoracic region: Secondary | ICD-10-CM | POA: Diagnosis not present

## 2021-08-29 DIAGNOSIS — M9901 Segmental and somatic dysfunction of cervical region: Secondary | ICD-10-CM | POA: Diagnosis not present

## 2021-08-29 DIAGNOSIS — M546 Pain in thoracic spine: Secondary | ICD-10-CM | POA: Diagnosis not present

## 2021-08-29 DIAGNOSIS — M542 Cervicalgia: Secondary | ICD-10-CM | POA: Diagnosis not present

## 2021-08-31 DIAGNOSIS — M9901 Segmental and somatic dysfunction of cervical region: Secondary | ICD-10-CM | POA: Diagnosis not present

## 2021-08-31 DIAGNOSIS — M546 Pain in thoracic spine: Secondary | ICD-10-CM | POA: Diagnosis not present

## 2021-08-31 DIAGNOSIS — M542 Cervicalgia: Secondary | ICD-10-CM | POA: Diagnosis not present

## 2021-08-31 DIAGNOSIS — M9902 Segmental and somatic dysfunction of thoracic region: Secondary | ICD-10-CM | POA: Diagnosis not present

## 2021-09-05 DIAGNOSIS — M542 Cervicalgia: Secondary | ICD-10-CM | POA: Diagnosis not present

## 2021-09-05 DIAGNOSIS — M9902 Segmental and somatic dysfunction of thoracic region: Secondary | ICD-10-CM | POA: Diagnosis not present

## 2021-09-05 DIAGNOSIS — M546 Pain in thoracic spine: Secondary | ICD-10-CM | POA: Diagnosis not present

## 2021-09-05 DIAGNOSIS — M9901 Segmental and somatic dysfunction of cervical region: Secondary | ICD-10-CM | POA: Diagnosis not present

## 2021-09-07 DIAGNOSIS — M9901 Segmental and somatic dysfunction of cervical region: Secondary | ICD-10-CM | POA: Diagnosis not present

## 2021-09-07 DIAGNOSIS — M9902 Segmental and somatic dysfunction of thoracic region: Secondary | ICD-10-CM | POA: Diagnosis not present

## 2021-09-07 DIAGNOSIS — M542 Cervicalgia: Secondary | ICD-10-CM | POA: Diagnosis not present

## 2021-09-07 DIAGNOSIS — M546 Pain in thoracic spine: Secondary | ICD-10-CM | POA: Diagnosis not present

## 2021-09-12 DIAGNOSIS — M9901 Segmental and somatic dysfunction of cervical region: Secondary | ICD-10-CM | POA: Diagnosis not present

## 2021-09-12 DIAGNOSIS — M546 Pain in thoracic spine: Secondary | ICD-10-CM | POA: Diagnosis not present

## 2021-09-12 DIAGNOSIS — M9902 Segmental and somatic dysfunction of thoracic region: Secondary | ICD-10-CM | POA: Diagnosis not present

## 2021-09-12 DIAGNOSIS — M542 Cervicalgia: Secondary | ICD-10-CM | POA: Diagnosis not present

## 2021-09-17 DIAGNOSIS — M9901 Segmental and somatic dysfunction of cervical region: Secondary | ICD-10-CM | POA: Diagnosis not present

## 2021-09-17 DIAGNOSIS — M542 Cervicalgia: Secondary | ICD-10-CM | POA: Diagnosis not present

## 2021-09-17 DIAGNOSIS — M546 Pain in thoracic spine: Secondary | ICD-10-CM | POA: Diagnosis not present

## 2021-09-17 DIAGNOSIS — M9902 Segmental and somatic dysfunction of thoracic region: Secondary | ICD-10-CM | POA: Diagnosis not present

## 2021-09-24 DIAGNOSIS — M542 Cervicalgia: Secondary | ICD-10-CM | POA: Diagnosis not present

## 2021-09-24 DIAGNOSIS — M546 Pain in thoracic spine: Secondary | ICD-10-CM | POA: Diagnosis not present

## 2021-09-24 DIAGNOSIS — M9901 Segmental and somatic dysfunction of cervical region: Secondary | ICD-10-CM | POA: Diagnosis not present

## 2021-09-24 DIAGNOSIS — M9902 Segmental and somatic dysfunction of thoracic region: Secondary | ICD-10-CM | POA: Diagnosis not present

## 2021-10-08 DIAGNOSIS — M9902 Segmental and somatic dysfunction of thoracic region: Secondary | ICD-10-CM | POA: Diagnosis not present

## 2021-10-08 DIAGNOSIS — M546 Pain in thoracic spine: Secondary | ICD-10-CM | POA: Diagnosis not present

## 2021-10-08 DIAGNOSIS — M542 Cervicalgia: Secondary | ICD-10-CM | POA: Diagnosis not present

## 2021-10-08 DIAGNOSIS — M9901 Segmental and somatic dysfunction of cervical region: Secondary | ICD-10-CM | POA: Diagnosis not present

## 2021-10-22 ENCOUNTER — Encounter (INDEPENDENT_AMBULATORY_CARE_PROVIDER_SITE_OTHER): Payer: Self-pay | Admitting: Gastroenterology

## 2021-10-22 ENCOUNTER — Ambulatory Visit (INDEPENDENT_AMBULATORY_CARE_PROVIDER_SITE_OTHER): Payer: Medicare HMO | Admitting: Gastroenterology

## 2021-10-22 DIAGNOSIS — Z86010 Personal history of colonic polyps: Secondary | ICD-10-CM | POA: Diagnosis not present

## 2021-10-22 NOTE — Progress Notes (Signed)
Maylon Peppers, M.D. ?Gastroenterology & Hepatology ?[facility] ?[address] ?[place],  [address] ? ?Primary Care Physician: ?Haywood Pao, MD ?[address] ?[place] [address] ? ?I will communicate my assessment and recommendations to the referring MD via EMR. ? ?Problems: ?1.2 cm tubular adenoma in proximity to the ileocecal valve ? ?History of Present Illness: ?Amy Reyes is a 71 y.o. female with past medical history of diabetes, GERD, hyperlipidemia, hypertension and arthritis, who presents for follow up after right-sided colectomy for large colonic polyp. ? ?The patient was last seen on 12/13/2020.  Patient underwent a colonoscopy at that time, she was found to have subcentimeter polyp was found in the cecum next to the IC valve. The polyp was carpet-like but the margins were not clear,it had an adenomatous appearance. Inspection of the borders was attempted by pulling the polyp with a forceps - the polyp felt hard when the forceps was closed. Area was injected with 10 mL Eleview for a lift polypectomy but there was significant resistance to injection. The polyp could not be lifted as Ithere was significant scarring, there was presence of Eleview lifting agent in the tissue in proximity to the polyp. Two sessile polyps were found in the sigmoid colon and ascending colon. The polyps were 2 to 3 mm in size. These polyps were removed with a cold snare. Resection and retrieval were complete. Path showed one hyperplastic and one TA polyps.  Recommended to have a repeat colonoscopy in 2 years.  Patient was referred to [facility] surgery for further evaluation and possible resection of the colon.  Involved by the pulm ? ?On 04/03/2021, patient underwent robotic proximal right colectomy (not able to remove the polyp via appendectomy and partial cecectomy).  She also underwent removal of laparoscopic adjustable gastric band. This was  performed by Dr. Johney Maine. ? ?Path results: ?A. APPENDIX, APPENDECTOMY:  ?- Appendix with benign serrated polyp  ?- Negative for dysplasia  ? ?B. COLON, PROXIMAL AND ILEUM, RESECTION:  ?- Tubular adenoma, 1.2 cm  ?- Negative for high-grade dysplasia or malignancy  ?- Margins appear viable  ?- Seventeen benign lymph nodes  ? ?She has felt well after her surgery and denies any complaints. States that she occasionally has nausea and took omeprazole which improved her symptoms. Has very seldom nausea, but has used zofran in the past. The patient denies having any vomiting, fever, chills, hematochezia, melena, hematemesis, abdominal distention, abdominal pain, diarrhea, jaundice, pruritus or weight loss. ? ?Last Colonoscopy: as above ? ?Past Medical History: ?Past Medical History:  ?Diagnosis Date  ? Allergy   ? seasonal allergies  ? Anemia   ? Arthritis   ? oa  ? Bronchitis hx of  ? Chronic kidney disease   ? ckd stage 2- 3 due to NSAIDS use no nephrologist  ? Colon polyps   ? Complication of anesthesia   ? slow to wake up one time, none recent  ? Diabetes mellitus without complication ([place])   ? borderline type 2 on metformin  ? History of carpal tunnel syndrome   ? years ago  ? History of kidney stones   ? History of rotator cuff tear   ? Bilateral  ? Hypercholesteremia   ? on meds  ? Hypertension   ? on meds  ? Peptic ulcer   ? PMB (postmenopausal bleeding)   ? last time middle of april 2021  ? ? ?Past Surgical History: ?Past Surgical History:  ?Procedure Laterality  Date  ? ABDOMINOPLASTY  30 yrs ago  ? tummy tuck  ? BREAST SURGERY    ? reduction  ? CHOLECYSTECTOMY  2000  ? laparoscopic  ? COLONOSCOPY  2019  ? TA  ? COLONOSCOPY WITH PROPOFOL N/A 12/13/2020  ? Procedure: COLONOSCOPY WITH PROPOFOL;  Surgeon: Harvel Quale, MD;  Location: [facility];  Service: Gastroenterology;  Laterality: N/A;  11:00  ? DILATATION & CURETTAGE/HYSTEROSCOPY WITH MYOSURE N/A 12/20/2019  ? Procedure: DILATATION &  CURETTAGE/HYSTEROSCOPY WITH MYOSURE RESECTION OF POLYP;  Surgeon: Megan Salon, MD;  Location: [facility];  Service: Gynecology;  Laterality: N/A;  resection of polyp,  to follow first case  ? INJECTION KNEE Left 10/14/2016  ? Procedure: CORTISONE LEFT KNEE INJECTION;  Surgeon: Gaynelle Arabian, MD;  Location: [facility];  Service: Orthopedics;  Laterality: Left;  ? Nash GASTRIC BANDING  2010 or 2011  ? left knee injection  6 weeks ago  ? POLYPECTOMY    ? TA  ? POLYPECTOMY  12/13/2020  ? Procedure: POLYPECTOMY;  Surgeon: Harvel Quale, MD;  Location: [facility];  Service: Gastroenterology;;  ascending colon  ? REDUCTION MAMMAPLASTY    ? ROTATOR CUFF REPAIR Left 05/2019  ? TOTAL KNEE ARTHROPLASTY Right 10/14/2016  ? Procedure: RIGHT TOTAL KNEE ARTHROPLASTY;  Surgeon: Gaynelle Arabian, MD;  Location: [facility];  Service: Orthopedics;  Laterality: Right;  Adductor Block  ? TOTAL KNEE ARTHROPLASTY Left 04/24/2020  ? Procedure: TOTAL KNEE ARTHROPLASTY;  Surgeon: Gaynelle Arabian, MD;  Location: [facility];  Service: Orthopedics;  Laterality: Left;  13mn  ? ? ?Family History: ?Family History  ?Problem Relation Age of Onset  ? Hypertension Mother   ? Thyroid disease Mother   ? Kidney disease Sister   ? Hypertension Sister   ? Diabetes Maternal Grandfather   ? Diabetes Maternal Aunt   ? Diabetes Maternal Uncle   ? Prostate cancer Father 691 ? Colon cancer Father 675 ? Colon polyps Father 624 ? Hypertension Brother   ? Uterine cancer Maternal Aunt   ? Rectal cancer Neg Hx   ? Stomach cancer Neg Hx   ? ? ?Social History: ?Social History  ? ?Tobacco Use  ?Smoking Status Former  ? Years: 1.00  ? Types: Cigarettes  ? Quit date: 05/16/1983  ? Years since quitting: 38.4  ?Smokeless Tobacco Never  ? ?Social History  ? ?Substance and Sexual Activity  ?Alcohol Use Never  ? Alcohol/week: 2.0 standard drinks  ? Types: 2 Glasses of wine per week  ? ?Social History  ? ?Substance and Sexual Activity  ?Drug Use No   ? ? ?Allergies: ?Allergies  ?Allergen Reactions  ? Tramadol Itching  ? Codeine   ?  Does not like the side effects-post taking  ? ? ?Medications: ?Current Outpatient Medications  ?Medication Sig Dispense Refill  ? albuterol (VENTOLIN HFA) 108 (90 Base) MCG/ACT inhaler Inhale 1-2 puffs into the lungs every 6 (six) hours as needed for wheezing or shortness of breath.    ? atorvastatin (LIPITOR) 80 MG tablet Take 80 mg by mouth in the morning.    ? Cholecalciferol (VITAMIN D3) 50 MCG (2000 UT) TABS Take 6,000 Units by mouth in the morning.    ? ferrous sulfate (SLOW IRON) 160 (50 Fe) MG TBCR SR tablet Take 1 tablet by mouth in the morning.    ? losartan-hydrochlorothiazide (HYZAAR) 100-25 MG tablet Take 1 tablet by mouth in the morning.    ? Metamucil Fiber  CHEW Chew by mouth daily at 6 (six) AM. 2 gummies per day.    ? Multiple Vitamin (MULTIVITAMIN) tablet Take 1 tablet by mouth daily.    ? OZEMPIC, 1 MG/DOSE, 4 MG/3ML SOPN Inject 1 mg into the skin once a week.    ? vitamin C (ASCORBIC ACID) 250 MG tablet Take 750 mg by mouth in the morning.    ? ?No current facility-administered medications for this visit.  ? ? ?Review of Systems: ?GENERAL: negative for malaise, night sweats ?HEENT: No changes in hearing or vision, no nose bleeds or other nasal problems. ?NECK: Negative for lumps, goiter, pain and significant neck swelling ?RESPIRATORY: Negative for cough, wheezing ?CARDIOVASCULAR: Negative for chest pain, leg swelling, palpitations, orthopnea ?GI: SEE HPI ?MUSCULOSKELETAL: Negative for joint pain or swelling, back pain, and muscle pain. ?SKIN: Negative for lesions, rash ?PSYCH: Negative for sleep disturbance, mood disorder and recent psychosocial stressors. ?HEMATOLOGY Negative for prolonged bleeding, bruising easily, and swollen nodes. ?ENDOCRINE: Negative for cold or heat intolerance, polyuria, polydipsia and goiter. ?NEURO: negative for tremor, gait imbalance, syncope and seizures. ?The remainder of the review  of systems is noncontributory. ? ? ?Physical Exam: ?BP 114/64 (BP Location: Left Arm, Patient Position: Sitting, Cuff Size: Large)   Pulse 76   Temp (!) 97.4 ?F (36.3 ?C) (Oral)   Ht '5\' 6"'$  (1.676 m)   Wt 205 lb 11.2 oz

## 2021-10-22 NOTE — Patient Instructions (Signed)
Repeat colonoscopy in 03/2023 ?

## 2021-10-24 DIAGNOSIS — M542 Cervicalgia: Secondary | ICD-10-CM | POA: Diagnosis not present

## 2021-10-24 DIAGNOSIS — M9901 Segmental and somatic dysfunction of cervical region: Secondary | ICD-10-CM | POA: Diagnosis not present

## 2021-10-24 DIAGNOSIS — M546 Pain in thoracic spine: Secondary | ICD-10-CM | POA: Diagnosis not present

## 2021-10-24 DIAGNOSIS — M9902 Segmental and somatic dysfunction of thoracic region: Secondary | ICD-10-CM | POA: Diagnosis not present

## 2021-10-30 ENCOUNTER — Telehealth (INDEPENDENT_AMBULATORY_CARE_PROVIDER_SITE_OTHER): Payer: Self-pay

## 2021-10-30 ENCOUNTER — Other Ambulatory Visit (INDEPENDENT_AMBULATORY_CARE_PROVIDER_SITE_OTHER): Payer: Self-pay | Admitting: Gastroenterology

## 2021-10-30 DIAGNOSIS — R110 Nausea: Secondary | ICD-10-CM

## 2021-10-30 MED ORDER — ONDANSETRON 4 MG PO TBDP: 4.0000 mg | ORAL_TABLET | Freq: Three times a day (TID) | ORAL | 1 refills | Status: DC | PRN

## 2021-10-30 NOTE — Telephone Encounter (Signed)
Patient states at last office visit you were going to call in something for nausea in for her she has checked with pharmacy and they told her we had not sent this in. Patient wants nausea medication that disintegrates called to [facility]. ?

## 2021-10-30 NOTE — Telephone Encounter (Signed)
Patient aware.

## 2021-10-30 NOTE — Telephone Encounter (Signed)
Medication sent to pharmacy  

## 2021-11-17 IMAGING — MG MM DIGITAL SCREENING BILAT W/ TOMO AND CAD
8 series · 8 of 24 positions shown · non-contrast
Comparison: None.

CLINICAL DATA: Screening.

EXAM:
DIGITAL SCREENING BILATERAL MAMMOGRAM WITH TOMOSYNTHESIS AND CAD
TECHNIQUE: Bilateral screening digital craniocaudal and mediolateral oblique
mammograms were obtained. Bilateral screening digital breast
tomosynthesis was performed. The images were evaluated with
computer-aided detection.

[L CC synth-2D]
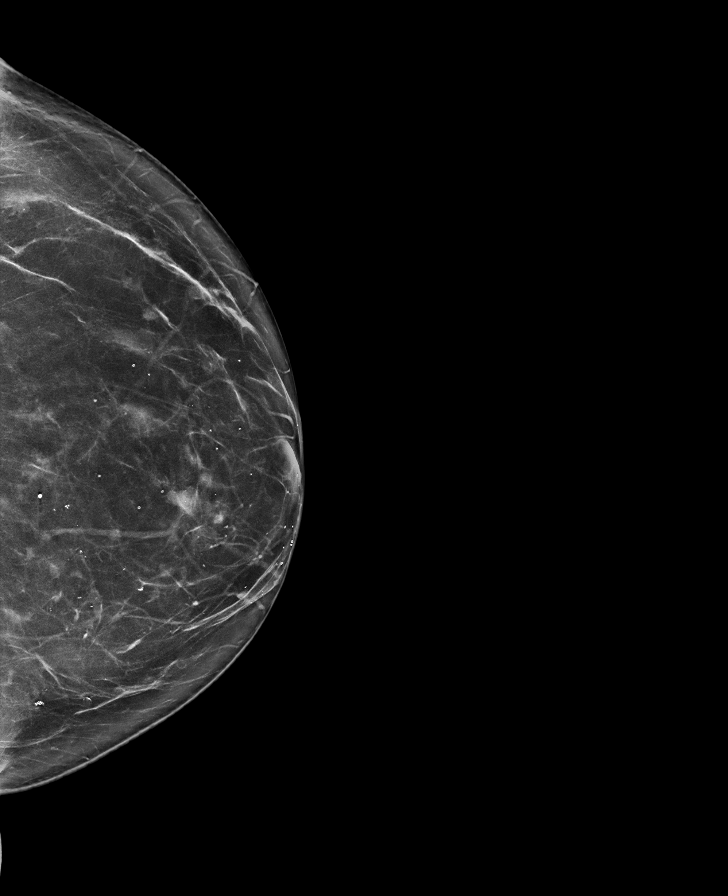

[R CC synth-2D]
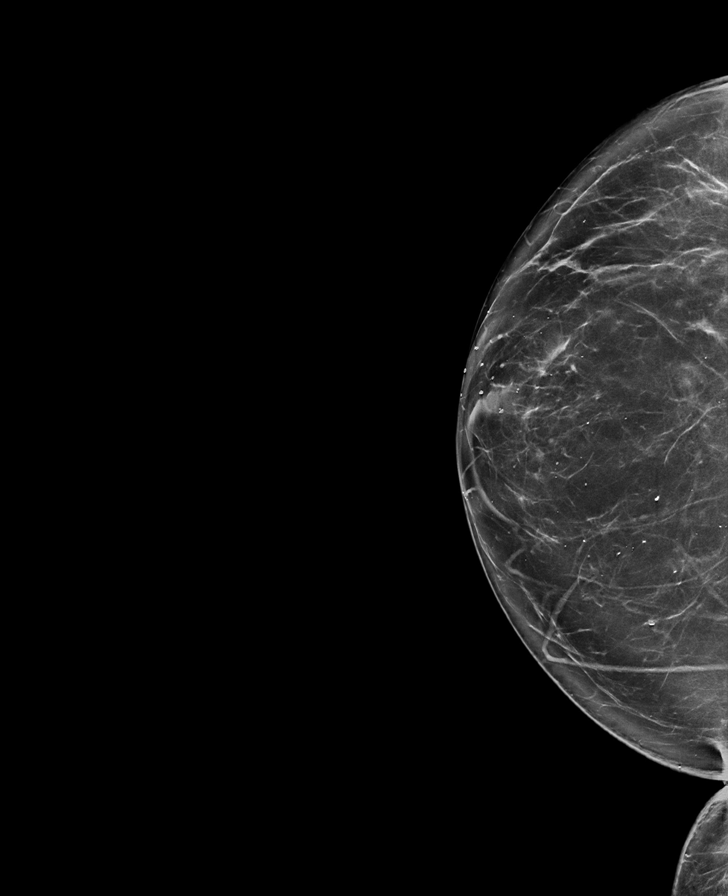

[L MLO synth-2D]
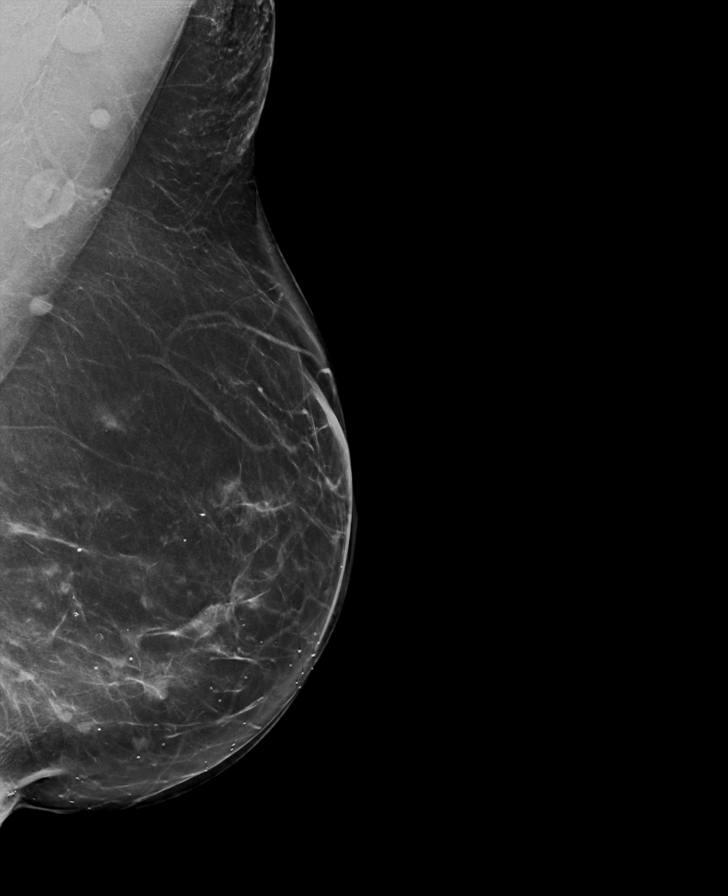

[R MLO synth-2D]
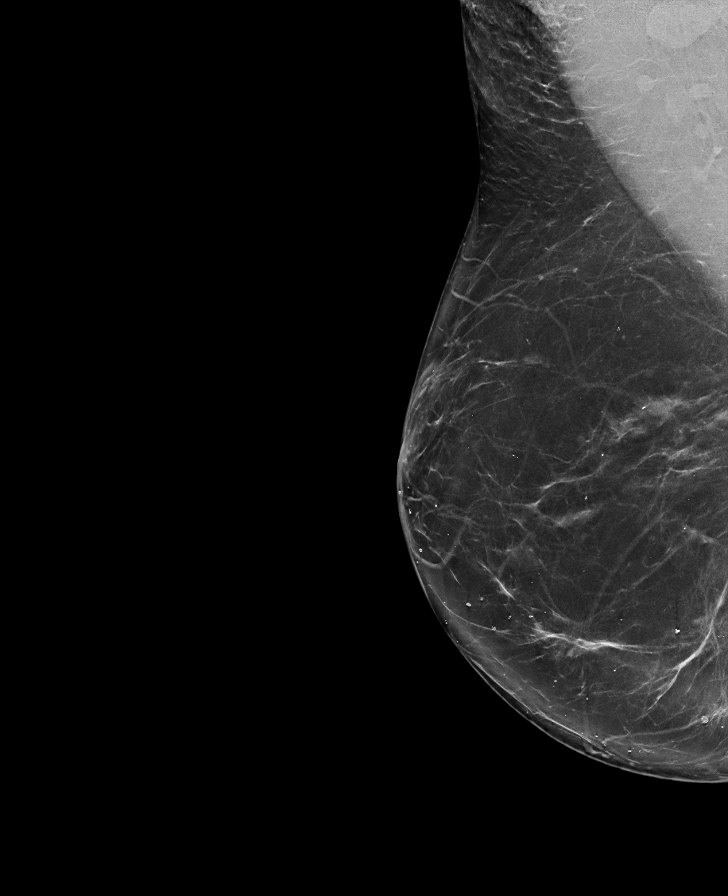

[R CC tomo · tomo slice 40/79.0]
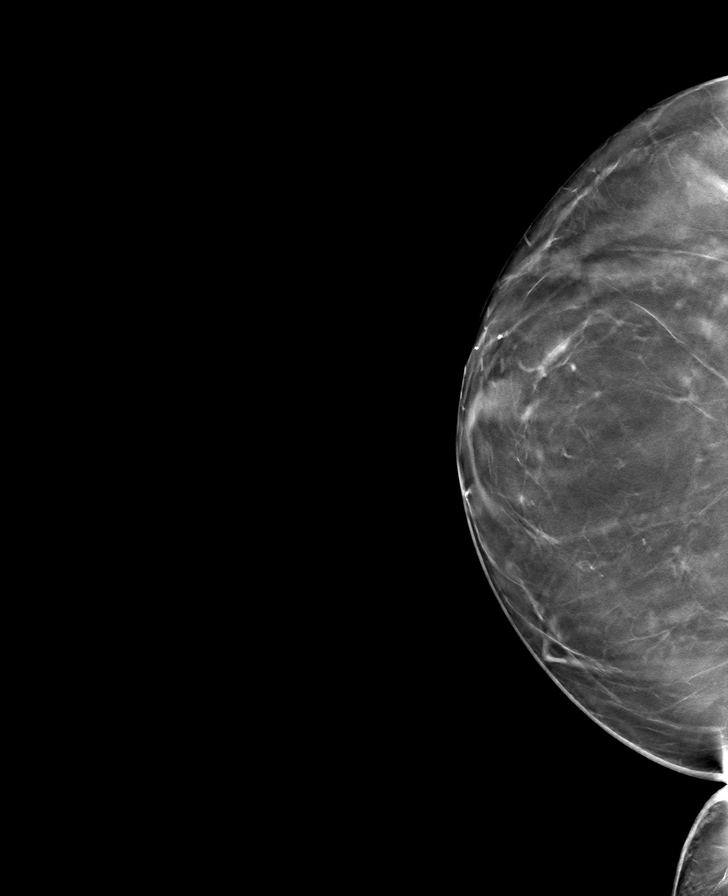

[L MLO tomo · tomo slice 47/92.0]
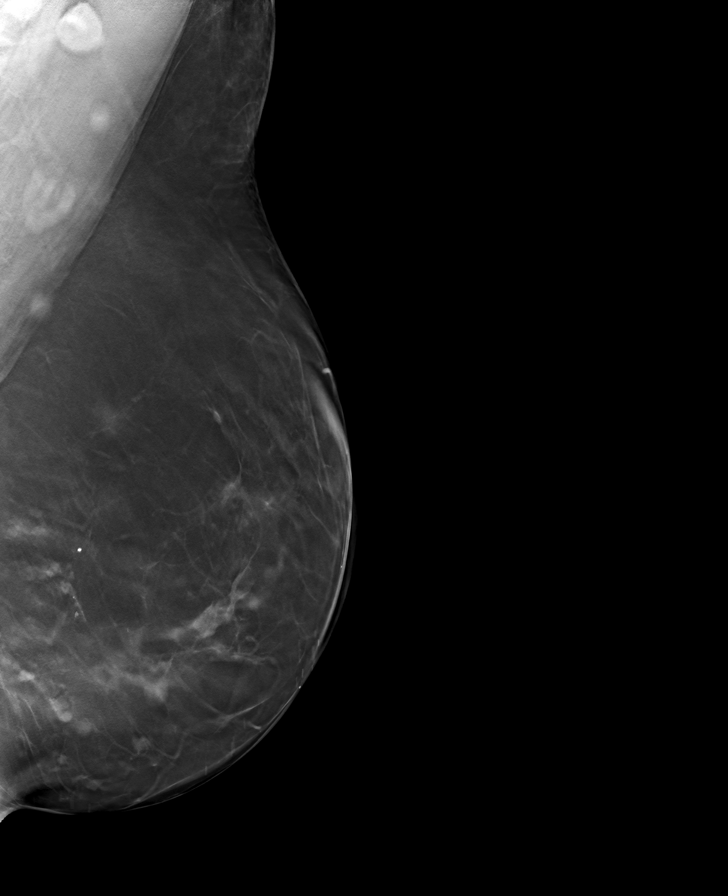

[L CC tomo · tomo slice 43/85.0]
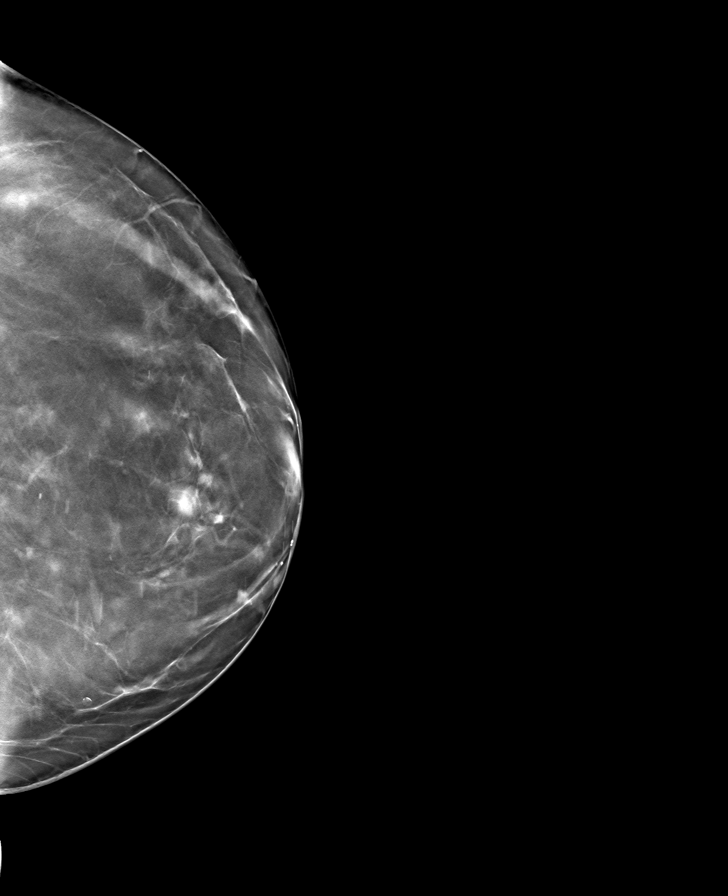

[R MLO tomo · tomo slice 45/90.0]
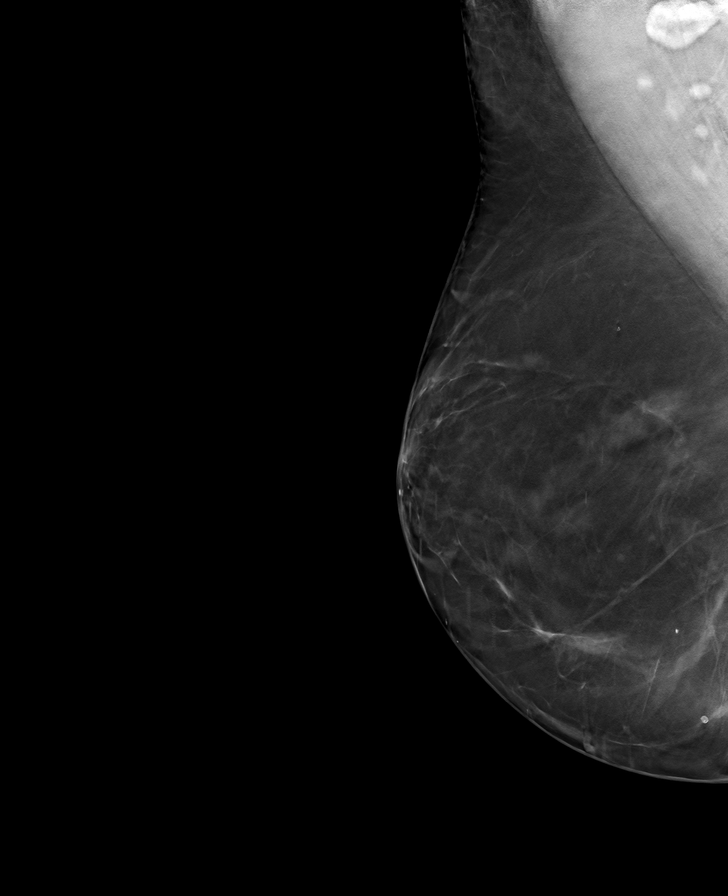

[8 of 24 positions shown; findings below may reference images not displayed]

ACR Breast Density Category b: There are scattered areas of
fibroglandular density.
FINDINGS: In the left breast, possible masses warrant further evaluation. In
the right breast, no findings suspicious for malignancy.
IMPRESSION: Further evaluation is suggested for possible masses in the left
breast.

RECOMMENDATION:
Diagnostic mammogram and possibly ultrasound of the left breast.
(Code:[id number]-K-FFX)

The patient will be contacted regarding the findings, and additional
imaging will be scheduled.

BI-RADS CATEGORY  0: Incomplete. Need additional imaging evaluation
and/or prior mammograms for comparison.

## 2021-12-13 DIAGNOSIS — M542 Cervicalgia: Secondary | ICD-10-CM | POA: Diagnosis not present

## 2021-12-13 DIAGNOSIS — N1832 Chronic kidney disease, stage 3b: Secondary | ICD-10-CM | POA: Diagnosis not present

## 2021-12-13 DIAGNOSIS — F321 Major depressive disorder, single episode, moderate: Secondary | ICD-10-CM | POA: Diagnosis not present

## 2021-12-13 DIAGNOSIS — I129 Hypertensive chronic kidney disease with stage 1 through stage 4 chronic kidney disease, or unspecified chronic kidney disease: Secondary | ICD-10-CM | POA: Diagnosis not present

## 2021-12-13 DIAGNOSIS — R809 Proteinuria, unspecified: Secondary | ICD-10-CM | POA: Diagnosis not present

## 2021-12-13 DIAGNOSIS — E7800 Pure hypercholesterolemia, unspecified: Secondary | ICD-10-CM | POA: Diagnosis not present

## 2021-12-13 DIAGNOSIS — D631 Anemia in chronic kidney disease: Secondary | ICD-10-CM | POA: Diagnosis not present

## 2021-12-13 DIAGNOSIS — E1129 Type 2 diabetes mellitus with other diabetic kidney complication: Secondary | ICD-10-CM | POA: Diagnosis not present

## 2021-12-28 DIAGNOSIS — M542 Cervicalgia: Secondary | ICD-10-CM | POA: Diagnosis not present

## 2022-03-04 DIAGNOSIS — M542 Cervicalgia: Secondary | ICD-10-CM | POA: Diagnosis not present

## 2022-03-21 ENCOUNTER — Other Ambulatory Visit: Payer: Self-pay

## 2022-03-21 ENCOUNTER — Encounter (HOSPITAL_COMMUNITY): Payer: Self-pay | Admitting: Emergency Medicine

## 2022-03-21 ENCOUNTER — Emergency Department (HOSPITAL_COMMUNITY)
Admission: EM | Admit: 2022-03-21 | Discharge: 2022-03-21 | Disposition: A | Discharge status: Home / Self Care | Payer: Medicare HMO | Attending: Emergency Medicine | Admitting: Emergency Medicine

## 2022-03-21 DIAGNOSIS — I10 Essential (primary) hypertension: Secondary | ICD-10-CM | POA: Insufficient documentation

## 2022-03-21 DIAGNOSIS — E119 Type 2 diabetes mellitus without complications: Secondary | ICD-10-CM | POA: Diagnosis not present

## 2022-03-21 DIAGNOSIS — Z79899 Other long term (current) drug therapy: Secondary | ICD-10-CM | POA: Insufficient documentation

## 2022-03-21 DIAGNOSIS — L02411 Cutaneous abscess of right axilla: Secondary | ICD-10-CM | POA: Insufficient documentation

## 2022-03-21 MED ORDER — LIDOCAINE HCL (PF) 1 % IJ SOLN
10.0000 mL | Freq: Once | INTRAMUSCULAR | Status: AC
  Administered 2022-03-21: 10 mL
  Filled 2022-03-21: qty 10

## 2022-03-21 MED ORDER — ONDANSETRON 4 MG PO TBDP
4.0000 mg | ORAL_TABLET | Freq: Once | ORAL | Status: AC
  Administered 2022-03-21: 4 mg via ORAL
  Filled 2022-03-21: qty 1

## 2022-03-21 MED ORDER — CEPHALEXIN 500 MG PO CAPS: 500.0000 mg | ORAL_CAPSULE | Freq: Four times a day (QID) | ORAL | 0 refills | Status: DC

## 2022-03-21 MED ORDER — LIDOCAINE HCL (PF) 1 % IJ SOLN
INTRAMUSCULAR | Status: AC
  Filled 2022-03-21: qty 5

## 2022-03-21 MED ORDER — KETOROLAC TROMETHAMINE 15 MG/ML IJ SOLN
15.0000 mg | Freq: Once | INTRAMUSCULAR | Status: AC
  Administered 2022-03-21: 15 mg via INTRAMUSCULAR
  Filled 2022-03-21: qty 1

## 2022-03-21 NOTE — ED Triage Notes (Signed)
Pt presents with growth under right axilla agitated by tight clothing she wore over the weekend.

## 2022-03-21 NOTE — ED Provider Notes (Signed)
[facility] EMERGENCY DEPARTMENT Provider Note   CSN: [id number] Arrival date & time: 03/21/22  1031     History Chief Complaint  Patient presents with   Abscess    Amy Reyes is a 71 y.o. female patient who presents to the emergency room today with an abscess to the right axillary region.  Patient states that she has always had a little nodule there for many years which has not bothered her.  She states that over the weekend she had a tighter fitting dress on which irritated the nodule and it became very swollen and painful.  She has been doing warm compresses and Epsom salt soaks at home and has been draining brown material.  She states it is now increasingly more painful which brings her to the ED for further evaluation.  She denies fever or chills.   Abscess      Home Medications Prior to Admission medications   Medication Sig Start Date End Date Taking? Authorizing Provider  cephALEXin (KEFLEX) 500 MG capsule Take 1 capsule (500 mg total) by mouth 4 (four) times daily. 03/21/22  Yes Raul Del, Kage Willmann M, PA-C  albuterol (VENTOLIN HFA) 108 (90 Base) MCG/ACT inhaler Inhale 1-2 puffs into the lungs every 6 (six) hours as needed for wheezing or shortness of breath. 11/27/20   [provider]  atorvastatin (LIPITOR) 80 MG tablet Take 80 mg by mouth in the morning. 08/23/19   [provider]  Cholecalciferol (VITAMIN D3) 50 MCG (2000 UT) TABS Take 6,000 Units by mouth in the morning.    [provider]  ferrous sulfate (SLOW IRON) 160 (50 Fe) MG TBCR SR tablet Take 1 tablet by mouth in the morning.    [provider]  losartan-hydrochlorothiazide (HYZAAR) 100-25 MG tablet Take 1 tablet by mouth in the morning. 09/08/16   [provider]  Metamucil Fiber CHEW Chew by mouth daily at 6 (six) AM. 2 gummies per day.    [provider]  Multiple Vitamin (MULTIVITAMIN) tablet Take 1 tablet by mouth daily.    [provider]   ondansetron (ZOFRAN-ODT) 4 MG disintegrating tablet Take 1 tablet (4 mg total) by mouth every 8 (eight) hours as needed for nausea or vomiting. 10/30/21   Harvel Quale, MD  OZEMPIC, 1 MG/DOSE, 4 MG/3ML SOPN Inject 1 mg into the skin once a week. 02/13/21   [provider]  vitamin C (ASCORBIC ACID) 250 MG tablet Take 750 mg by mouth in the morning.    [provider]      Allergies    Tramadol and Codeine    Review of Systems   Review of Systems  All other systems reviewed and are negative.   Physical Exam Updated Vital Signs BP 127/66   Pulse 74   Temp 98.2 F (36.8 C) (Oral)   Resp 20   Ht '5\' 6"'$  (1.676 m)   Wt 92.1 kg   SpO2 100%   BMI 32.77 kg/m  Physical Exam Vitals and nursing note reviewed.  Constitutional:      Appearance: Normal appearance.  HENT:     Head: Normocephalic and atraumatic.  Eyes:     General:        Right eye: No discharge.        Left eye: No discharge.     Conjunctiva/sclera: Conjunctivae normal.  Pulmonary:     Effort: Pulmonary effort is normal.  Skin:    General: Skin is warm and dry.  Findings: No rash.     Comments: There is a 5 cm in diameter area of swelling with a small amount of fluctuance which is at risk of an abscess.  Neurological:     General: No focal deficit present.     Mental Status: She is alert.  Psychiatric:        Mood and Affect: Mood normal.        Behavior: Behavior normal.     ED Results / Procedures / Treatments   Labs (all labs ordered are listed, but only abnormal results are displayed) Labs Reviewed - No data to display  EKG None  Radiology No results found.  Procedures .Marland KitchenIncision and Drainage  Date/Time: 03/21/2022 1:52 PM  Performed by: Hendricks Limes, PA-C Authorized by: Hendricks Limes, PA-C   Consent:    Consent obtained:  Verbal   Consent given by:  Patient   Risks, benefits, and alternatives were discussed: yes     Risks discussed:  Bleeding    Alternatives discussed:  No treatment Universal protocol:    Procedure explained and questions answered to patient or proxy's satisfaction: yes     Relevant documents present and verified: yes     Test results available : no     Imaging studies available: no     Required blood products, implants, devices, and special equipment available: no     Site/side marked: no     Immediately prior to procedure, a time out was called: no     Patient identity confirmed:  Verbally with patient and arm band Location:    Type:  Abscess   Size:  5cm   Location:  Upper extremity   Upper extremity location: Right axilla. Pre-procedure details:    Skin preparation:  Povidone-iodine Sedation:    Sedation type:  None Anesthesia:    Anesthesia method:  Local infiltration   Local anesthetic:  Lidocaine 1% w/o epi Procedure type:    Complexity:  Complex Procedure details:    Ultrasound guidance: no     Needle aspiration: no     Incision depth:  Dermal   Wound management:  Probed and deloculated and irrigated with saline   Drainage:  Bloody and purulent   Drainage amount:  Moderate   Wound treatment:  Wound left open Post-procedure details:    Procedure completion:  Tolerated well, no immediate complications     Medications Ordered in ED Medications  lidocaine (PF) (XYLOCAINE) 1 % injection 10 mL (has no administration in time range)  lidocaine (PF) (XYLOCAINE) 1 % injection (has no administration in time range)  ketorolac (TORADOL) 15 MG/ML injection 15 mg (has no administration in time range)  ondansetron (ZOFRAN-ODT) disintegrating tablet 4 mg (4 mg Oral Given 03/21/22 1303)    ED Course/ Medical Decision Making/ A&P                           Medical Decision Making Amy Reyes is a 71 y.o. female patient who presents to the emergency department today for further evaluation of a possible axillary abscess.  This is consistent with an abscess.  Patient has no signs of systemic  infection.  I will plan to do an I&D at the bedside.  I personally drained the abscess at the bedside.  I got a large amount of purulent bloody material.  I thoroughly irrigated afterwards.  Please see procedure note for further details.  Patient does have a history of  diabetes and hypertension.  I will place her on antibiotics for additional coverage.  I instructed her on appropriate wound care.  She will follow-up with her primary care doctor.  Strict return precautions were discussed.  She is safe for discharge at this time.  Risk Prescription drug management.   Final Clinical Impression(s) / ED Diagnoses Final diagnoses:  Abscess of axilla, right    Rx / DC Orders ED Discharge Orders          Ordered    cephALEXin (KEFLEX) 500 MG capsule  4 times daily        03/21/22 1351              Myna Bright [place], [place] 03/21/22 1354    Hayden Rasmussen, MD 03/21/22 [phone]

## 2022-03-21 NOTE — Discharge Instructions (Addendum)
Please continue using warm compresses and the swelling will go down.  Tylenol for pain.  Please return to the emergency department for any worsening symptoms you might have.

## 2022-06-11 DIAGNOSIS — E1129 Type 2 diabetes mellitus with other diabetic kidney complication: Secondary | ICD-10-CM | POA: Diagnosis not present

## 2022-06-11 DIAGNOSIS — E7800 Pure hypercholesterolemia, unspecified: Secondary | ICD-10-CM | POA: Diagnosis not present

## 2022-06-11 DIAGNOSIS — N1832 Chronic kidney disease, stage 3b: Secondary | ICD-10-CM | POA: Diagnosis not present

## 2022-06-14 DIAGNOSIS — R809 Proteinuria, unspecified: Secondary | ICD-10-CM | POA: Diagnosis not present

## 2022-06-14 DIAGNOSIS — F321 Major depressive disorder, single episode, moderate: Secondary | ICD-10-CM | POA: Diagnosis not present

## 2022-06-14 DIAGNOSIS — I129 Hypertensive chronic kidney disease with stage 1 through stage 4 chronic kidney disease, or unspecified chronic kidney disease: Secondary | ICD-10-CM | POA: Diagnosis not present

## 2022-06-14 DIAGNOSIS — N1832 Chronic kidney disease, stage 3b: Secondary | ICD-10-CM | POA: Diagnosis not present

## 2022-06-14 DIAGNOSIS — E1129 Type 2 diabetes mellitus with other diabetic kidney complication: Secondary | ICD-10-CM | POA: Diagnosis not present

## 2022-06-14 DIAGNOSIS — Z9884 Bariatric surgery status: Secondary | ICD-10-CM | POA: Diagnosis not present

## 2022-06-14 DIAGNOSIS — E7800 Pure hypercholesterolemia, unspecified: Secondary | ICD-10-CM | POA: Diagnosis not present

## 2022-06-14 DIAGNOSIS — Z0000 Encounter for general adult medical examination without abnormal findings: Secondary | ICD-10-CM | POA: Diagnosis not present

## 2022-08-27 DIAGNOSIS — H524 Presbyopia: Secondary | ICD-10-CM | POA: Diagnosis not present

## 2022-09-13 DIAGNOSIS — J302 Other seasonal allergic rhinitis: Secondary | ICD-10-CM | POA: Diagnosis not present

## 2022-09-13 DIAGNOSIS — Z96651 Presence of right artificial knee joint: Secondary | ICD-10-CM | POA: Diagnosis not present

## 2022-09-13 DIAGNOSIS — E7800 Pure hypercholesterolemia, unspecified: Secondary | ICD-10-CM | POA: Diagnosis not present

## 2022-09-13 DIAGNOSIS — M542 Cervicalgia: Secondary | ICD-10-CM | POA: Diagnosis not present

## 2022-09-13 DIAGNOSIS — F321 Major depressive disorder, single episode, moderate: Secondary | ICD-10-CM | POA: Diagnosis not present

## 2022-09-13 DIAGNOSIS — Z9884 Bariatric surgery status: Secondary | ICD-10-CM | POA: Diagnosis not present

## 2022-09-13 DIAGNOSIS — R69 Illness, unspecified: Secondary | ICD-10-CM | POA: Diagnosis not present

## 2022-09-13 DIAGNOSIS — D631 Anemia in chronic kidney disease: Secondary | ICD-10-CM | POA: Diagnosis not present

## 2022-09-13 DIAGNOSIS — K6389 Other specified diseases of intestine: Secondary | ICD-10-CM | POA: Diagnosis not present

## 2022-09-13 DIAGNOSIS — N1832 Chronic kidney disease, stage 3b: Secondary | ICD-10-CM | POA: Diagnosis not present

## 2022-09-13 DIAGNOSIS — I129 Hypertensive chronic kidney disease with stage 1 through stage 4 chronic kidney disease, or unspecified chronic kidney disease: Secondary | ICD-10-CM | POA: Diagnosis not present

## 2022-09-13 DIAGNOSIS — E1129 Type 2 diabetes mellitus with other diabetic kidney complication: Secondary | ICD-10-CM | POA: Diagnosis not present

## 2022-10-24 DIAGNOSIS — H5202 Hypermetropia, left eye: Secondary | ICD-10-CM | POA: Diagnosis not present

## 2022-10-24 DIAGNOSIS — H52222 Regular astigmatism, left eye: Secondary | ICD-10-CM | POA: Diagnosis not present

## 2022-11-12 DIAGNOSIS — E669 Obesity, unspecified: Secondary | ICD-10-CM | POA: Diagnosis not present

## 2022-11-12 DIAGNOSIS — Z6834 Body mass index (BMI) 34.0-34.9, adult: Secondary | ICD-10-CM | POA: Diagnosis not present

## 2022-11-12 DIAGNOSIS — M25531 Pain in right wrist: Secondary | ICD-10-CM | POA: Diagnosis not present

## 2022-12-27 ENCOUNTER — Encounter (HOSPITAL_BASED_OUTPATIENT_CLINIC_OR_DEPARTMENT_OTHER): Payer: Self-pay | Admitting: Obstetrics & Gynecology

## 2022-12-27 ENCOUNTER — Ambulatory Visit (HOSPITAL_BASED_OUTPATIENT_CLINIC_OR_DEPARTMENT_OTHER): Payer: Medicare HMO | Admitting: Obstetrics & Gynecology

## 2022-12-27 VITALS — BP 113/51 | HR 59 | Ht 66.0 in | Wt 209.0 lb

## 2022-12-27 DIAGNOSIS — N852 Hypertrophy of uterus: Secondary | ICD-10-CM | POA: Diagnosis not present

## 2022-12-27 NOTE — Progress Notes (Signed)
GYNECOLOGY  VISIT  CC:   occasional lower abdominal pain  HPI: 72 y.o. [id number] Widowed Black or [place] American female here for complaint of lower abdominal pain that occurs maybe once every other month or possibly twice monthly.  Will last a few minutes and then go away.    She has regular bowel movements and take Benefiber.  Denies constipation.  Denies urinary urgency.  Does get up at night from time to time but this is typically once.  Denies dysuria.    Denies vaginal bleeding.    Past Medical History:  Diagnosis Date   Allergy    seasonal allergies   Anemia    Arthritis    oa   Bronchitis hx of   Chronic kidney disease    ckd stage 2- 3 due to NSAIDS use no nephrologist   Colon polyps    Complication of anesthesia    slow to wake up one time, none recent   Diabetes mellitus without complication ([facility])    borderline type 2 on metformin   History of carpal tunnel syndrome    years ago   History of kidney stones    History of rotator cuff tear    Bilateral   Hypercholesteremia    on meds   Hypertension    on meds   Peptic ulcer    PMB (postmenopausal bleeding)    last time middle of april 2021    MEDS:   Current Outpatient Medications on File Prior to Visit  Medication Sig Dispense Refill   albuterol (VENTOLIN HFA) 108 (90 Base) MCG/ACT inhaler Inhale 1-2 puffs into the lungs every 6 (six) hours as needed for wheezing or shortness of breath.     atorvastatin (LIPITOR) 80 MG tablet Take 80 mg by mouth in the morning.     Cholecalciferol (VITAMIN D3) 50 MCG (2000 UT) TABS Take 6,000 Units by mouth in the morning.     ferrous sulfate (SLOW IRON) 160 (50 Fe) MG TBCR SR tablet Take 1 tablet by mouth in the morning.     losartan-hydrochlorothiazide (HYZAAR) 100-25 MG tablet Take 1 tablet by mouth in the morning.     Metamucil Fiber CHEW Chew by mouth daily at 6 (six) AM. 2 gummies per day.     Multiple Vitamin (MULTIVITAMIN) tablet Take 1 tablet by mouth daily.      ondansetron (ZOFRAN-ODT) 4 MG disintegrating tablet Take 1 tablet (4 mg total) by mouth every 8 (eight) hours as needed for nausea or vomiting. 90 tablet 1   OZEMPIC, 1 MG/DOSE, 4 MG/3ML SOPN Inject 1 mg into the skin once a week.     vitamin C (ASCORBIC ACID) 250 MG tablet Take 750 mg by mouth in the morning.     No current facility-administered medications on file prior to visit.    ALLERGIES: Tramadol and Codeine  SH:  widowed, non smoker  Review of Systems  Constitutional: Negative.     PHYSICAL EXAMINATION:    BP (!) 113/51 (BP Location: Right Arm, Patient Position: Sitting, Cuff Size: Large)   Pulse (!) 59   Ht 5\' 6"  (1.676 m) Comment: Reported  Wt 209 lb (94.8 kg)   BMI 33.73 kg/m     General appearance: alert, cooperative and appears stated age Abdomen: soft, non-tender; bowel sounds normal; no masses,  no organomegaly Lymph:  no inguinal LAD noted  Pelvic: External genitalia:  no lesions              Urethra:  normal appearing urethra with no masses, tenderness or lesions              Bartholins and Skenes: normal                 Vagina: normal appearing vagina with normal color and discharge, no lesions              Cervix: no lesions              Bimanual Exam:  Uterus:  enlarged, 12 weeks size              Adnexa: no mass, fullness, tenderness              Rectovaginal: Yes.  .  Confirms.              Anus:  normal sphincter tone, prolapsed hemorrhoid  Chaperone, Ina Homes, CMA, was present for exam.  Assessment/Plan: 1. Enlarged uterus - reviewed CT and ultrasound from 2009 and 2021.  Exam is stable in comparison to the ultrasound done in 2021.  Discussed ultrasound.  She feels comfortable, for now, with monitoring conservatively.  Recommended she call if has any new changes.  Can order ultrasound at any time with any new concerns.  Total time with pt:  23 minutes

## 2022-12-30 DIAGNOSIS — F321 Major depressive disorder, single episode, moderate: Secondary | ICD-10-CM | POA: Diagnosis not present

## 2022-12-30 DIAGNOSIS — E7800 Pure hypercholesterolemia, unspecified: Secondary | ICD-10-CM | POA: Diagnosis not present

## 2022-12-30 DIAGNOSIS — E1129 Type 2 diabetes mellitus with other diabetic kidney complication: Secondary | ICD-10-CM | POA: Diagnosis not present

## 2022-12-30 DIAGNOSIS — Z96651 Presence of right artificial knee joint: Secondary | ICD-10-CM | POA: Diagnosis not present

## 2022-12-30 DIAGNOSIS — D692 Other nonthrombocytopenic purpura: Secondary | ICD-10-CM | POA: Diagnosis not present

## 2022-12-30 DIAGNOSIS — Z9884 Bariatric surgery status: Secondary | ICD-10-CM | POA: Diagnosis not present

## 2022-12-30 DIAGNOSIS — N1832 Chronic kidney disease, stage 3b: Secondary | ICD-10-CM | POA: Diagnosis not present

## 2022-12-30 DIAGNOSIS — I129 Hypertensive chronic kidney disease with stage 1 through stage 4 chronic kidney disease, or unspecified chronic kidney disease: Secondary | ICD-10-CM | POA: Diagnosis not present

## 2022-12-30 DIAGNOSIS — D631 Anemia in chronic kidney disease: Secondary | ICD-10-CM | POA: Diagnosis not present

## 2022-12-31 DIAGNOSIS — H353130 Nonexudative age-related macular degeneration, bilateral, stage unspecified: Secondary | ICD-10-CM | POA: Diagnosis not present

## 2022-12-31 DIAGNOSIS — H2513 Age-related nuclear cataract, bilateral: Secondary | ICD-10-CM | POA: Diagnosis not present

## 2023-02-14 DIAGNOSIS — E119 Type 2 diabetes mellitus without complications: Secondary | ICD-10-CM | POA: Diagnosis not present

## 2023-02-14 DIAGNOSIS — I10 Essential (primary) hypertension: Secondary | ICD-10-CM | POA: Diagnosis not present

## 2023-02-14 DIAGNOSIS — Z8249 Family history of ischemic heart disease and other diseases of the circulatory system: Secondary | ICD-10-CM | POA: Diagnosis not present

## 2023-02-14 DIAGNOSIS — R112 Nausea with vomiting, unspecified: Secondary | ICD-10-CM | POA: Diagnosis not present

## 2023-02-14 DIAGNOSIS — Z833 Family history of diabetes mellitus: Secondary | ICD-10-CM | POA: Diagnosis not present

## 2023-02-14 DIAGNOSIS — Z008 Encounter for other general examination: Secondary | ICD-10-CM | POA: Diagnosis not present

## 2023-02-14 DIAGNOSIS — M1990 Unspecified osteoarthritis, unspecified site: Secondary | ICD-10-CM | POA: Diagnosis not present

## 2023-02-14 DIAGNOSIS — Z7985 Long-term (current) use of injectable non-insulin antidiabetic drugs: Secondary | ICD-10-CM | POA: Diagnosis not present

## 2023-02-14 DIAGNOSIS — Z6833 Body mass index (BMI) 33.0-33.9, adult: Secondary | ICD-10-CM | POA: Diagnosis not present

## 2023-02-14 DIAGNOSIS — E785 Hyperlipidemia, unspecified: Secondary | ICD-10-CM | POA: Diagnosis not present

## 2023-02-14 DIAGNOSIS — Z87891 Personal history of nicotine dependence: Secondary | ICD-10-CM | POA: Diagnosis not present

## 2023-02-14 DIAGNOSIS — Z818 Family history of other mental and behavioral disorders: Secondary | ICD-10-CM | POA: Diagnosis not present

## 2023-02-14 DIAGNOSIS — E669 Obesity, unspecified: Secondary | ICD-10-CM | POA: Diagnosis not present

## 2023-02-17 DIAGNOSIS — Z0100 Encounter for examination of eyes and vision without abnormal findings: Secondary | ICD-10-CM | POA: Diagnosis not present

## 2023-02-20 DIAGNOSIS — M25552 Pain in left hip: Secondary | ICD-10-CM | POA: Diagnosis not present

## 2023-03-06 DIAGNOSIS — M25552 Pain in left hip: Secondary | ICD-10-CM | POA: Diagnosis not present

## 2023-03-07 ENCOUNTER — Ambulatory Visit: Payer: Medicare HMO | Admitting: Orthopedic Surgery

## 2023-03-10 ENCOUNTER — Other Ambulatory Visit (HOSPITAL_COMMUNITY): Payer: Self-pay | Admitting: Otolaryngology

## 2023-03-10 DIAGNOSIS — M25552 Pain in left hip: Secondary | ICD-10-CM

## 2023-03-20 ENCOUNTER — Ambulatory Visit (HOSPITAL_COMMUNITY)
Admission: RE | Admit: 2023-03-20 | Discharge: 2023-03-20 | Disposition: A | Discharge status: Home / Self Care | Payer: Medicare HMO | Source: Ambulatory Visit | Attending: Otolaryngology | Admitting: Otolaryngology

## 2023-03-20 DIAGNOSIS — S73192A Other sprain of left hip, initial encounter: Secondary | ICD-10-CM | POA: Diagnosis not present

## 2023-03-20 DIAGNOSIS — M25552 Pain in left hip: Secondary | ICD-10-CM | POA: Diagnosis not present

## 2023-03-20 DIAGNOSIS — S76012A Strain of muscle, fascia and tendon of left hip, initial encounter: Secondary | ICD-10-CM | POA: Diagnosis not present

## 2023-03-20 DIAGNOSIS — M1612 Unilateral primary osteoarthritis, left hip: Secondary | ICD-10-CM | POA: Diagnosis not present

## 2023-04-23 DIAGNOSIS — R809 Proteinuria, unspecified: Secondary | ICD-10-CM | POA: Diagnosis not present

## 2023-04-23 DIAGNOSIS — I129 Hypertensive chronic kidney disease with stage 1 through stage 4 chronic kidney disease, or unspecified chronic kidney disease: Secondary | ICD-10-CM | POA: Diagnosis not present

## 2023-04-23 DIAGNOSIS — E7800 Pure hypercholesterolemia, unspecified: Secondary | ICD-10-CM | POA: Diagnosis not present

## 2023-04-23 DIAGNOSIS — Z96651 Presence of right artificial knee joint: Secondary | ICD-10-CM | POA: Diagnosis not present

## 2023-04-23 DIAGNOSIS — Z9884 Bariatric surgery status: Secondary | ICD-10-CM | POA: Diagnosis not present

## 2023-04-23 DIAGNOSIS — E1129 Type 2 diabetes mellitus with other diabetic kidney complication: Secondary | ICD-10-CM | POA: Diagnosis not present

## 2023-04-23 DIAGNOSIS — F321 Major depressive disorder, single episode, moderate: Secondary | ICD-10-CM | POA: Diagnosis not present

## 2023-04-23 DIAGNOSIS — D631 Anemia in chronic kidney disease: Secondary | ICD-10-CM | POA: Diagnosis not present

## 2023-04-23 DIAGNOSIS — N1831 Chronic kidney disease, stage 3a: Secondary | ICD-10-CM | POA: Diagnosis not present

## 2023-04-23 DIAGNOSIS — M542 Cervicalgia: Secondary | ICD-10-CM | POA: Diagnosis not present

## 2023-04-23 DIAGNOSIS — D692 Other nonthrombocytopenic purpura: Secondary | ICD-10-CM | POA: Diagnosis not present

## 2023-05-06 DIAGNOSIS — Z1231 Encounter for screening mammogram for malignant neoplasm of breast: Secondary | ICD-10-CM | POA: Diagnosis not present

## 2023-06-09 DIAGNOSIS — Z96652 Presence of left artificial knee joint: Secondary | ICD-10-CM | POA: Diagnosis not present

## 2023-06-09 DIAGNOSIS — Z9660 Presence of unspecified orthopedic joint implant: Secondary | ICD-10-CM | POA: Diagnosis not present

## 2023-06-09 DIAGNOSIS — M25552 Pain in left hip: Secondary | ICD-10-CM | POA: Diagnosis not present

## 2023-06-17 DIAGNOSIS — Z1212 Encounter for screening for malignant neoplasm of rectum: Secondary | ICD-10-CM | POA: Diagnosis not present

## 2023-06-17 DIAGNOSIS — D649 Anemia, unspecified: Secondary | ICD-10-CM | POA: Diagnosis not present

## 2023-06-17 DIAGNOSIS — E785 Hyperlipidemia, unspecified: Secondary | ICD-10-CM | POA: Diagnosis not present

## 2023-06-18 DIAGNOSIS — E7800 Pure hypercholesterolemia, unspecified: Secondary | ICD-10-CM | POA: Diagnosis not present

## 2023-06-18 DIAGNOSIS — I129 Hypertensive chronic kidney disease with stage 1 through stage 4 chronic kidney disease, or unspecified chronic kidney disease: Secondary | ICD-10-CM | POA: Diagnosis not present

## 2023-06-18 DIAGNOSIS — E1129 Type 2 diabetes mellitus with other diabetic kidney complication: Secondary | ICD-10-CM | POA: Diagnosis not present

## 2023-06-18 DIAGNOSIS — N1831 Chronic kidney disease, stage 3a: Secondary | ICD-10-CM | POA: Diagnosis not present

## 2023-06-24 DIAGNOSIS — I129 Hypertensive chronic kidney disease with stage 1 through stage 4 chronic kidney disease, or unspecified chronic kidney disease: Secondary | ICD-10-CM | POA: Diagnosis not present

## 2023-06-24 DIAGNOSIS — N1832 Chronic kidney disease, stage 3b: Secondary | ICD-10-CM | POA: Diagnosis not present

## 2023-06-24 DIAGNOSIS — Z1331 Encounter for screening for depression: Secondary | ICD-10-CM | POA: Diagnosis not present

## 2023-06-24 DIAGNOSIS — F321 Major depressive disorder, single episode, moderate: Secondary | ICD-10-CM | POA: Diagnosis not present

## 2023-06-24 DIAGNOSIS — E7800 Pure hypercholesterolemia, unspecified: Secondary | ICD-10-CM | POA: Diagnosis not present

## 2023-06-24 DIAGNOSIS — Z6833 Body mass index (BMI) 33.0-33.9, adult: Secondary | ICD-10-CM | POA: Diagnosis not present

## 2023-06-24 DIAGNOSIS — D692 Other nonthrombocytopenic purpura: Secondary | ICD-10-CM | POA: Diagnosis not present

## 2023-06-24 DIAGNOSIS — N1831 Chronic kidney disease, stage 3a: Secondary | ICD-10-CM | POA: Diagnosis not present

## 2023-06-24 DIAGNOSIS — Z1339 Encounter for screening examination for other mental health and behavioral disorders: Secondary | ICD-10-CM | POA: Diagnosis not present

## 2023-06-24 DIAGNOSIS — D631 Anemia in chronic kidney disease: Secondary | ICD-10-CM | POA: Diagnosis not present

## 2023-06-24 DIAGNOSIS — E1129 Type 2 diabetes mellitus with other diabetic kidney complication: Secondary | ICD-10-CM | POA: Diagnosis not present

## 2023-06-24 DIAGNOSIS — Z9884 Bariatric surgery status: Secondary | ICD-10-CM | POA: Diagnosis not present

## 2023-06-24 DIAGNOSIS — Z0000 Encounter for general adult medical examination without abnormal findings: Secondary | ICD-10-CM | POA: Diagnosis not present

## 2023-06-24 DIAGNOSIS — J302 Other seasonal allergic rhinitis: Secondary | ICD-10-CM | POA: Diagnosis not present

## 2023-06-24 DIAGNOSIS — Z96651 Presence of right artificial knee joint: Secondary | ICD-10-CM | POA: Diagnosis not present

## 2023-07-29 DIAGNOSIS — M25552 Pain in left hip: Secondary | ICD-10-CM | POA: Diagnosis not present

## 2023-07-31 DIAGNOSIS — M7062 Trochanteric bursitis, left hip: Secondary | ICD-10-CM | POA: Diagnosis not present

## 2023-08-01 DIAGNOSIS — M25552 Pain in left hip: Secondary | ICD-10-CM | POA: Diagnosis not present

## 2023-08-30 DIAGNOSIS — M792 Neuralgia and neuritis, unspecified: Secondary | ICD-10-CM | POA: Diagnosis not present

## 2023-08-30 DIAGNOSIS — M79605 Pain in left leg: Secondary | ICD-10-CM | POA: Diagnosis not present

## 2023-08-30 DIAGNOSIS — M79604 Pain in right leg: Secondary | ICD-10-CM | POA: Diagnosis not present

## 2023-08-30 DIAGNOSIS — G629 Polyneuropathy, unspecified: Secondary | ICD-10-CM | POA: Diagnosis not present

## 2023-09-08 ENCOUNTER — Encounter: Payer: Self-pay | Admitting: Neurology

## 2023-09-08 ENCOUNTER — Ambulatory Visit: Payer: Medicare HMO | Admitting: Neurology

## 2023-09-08 VITALS — BP 126/78 | Ht 66.0 in | Wt 201.0 lb

## 2023-09-08 DIAGNOSIS — G2581 Restless legs syndrome: Secondary | ICD-10-CM

## 2023-09-08 DIAGNOSIS — G629 Polyneuropathy, unspecified: Secondary | ICD-10-CM | POA: Diagnosis not present

## 2023-09-08 MED ORDER — ROPINIROLE HCL 0.25 MG PO TABS: 0.2500 mg | ORAL_TABLET | Freq: Every day | ORAL | 0 refills | Status: DC

## 2023-09-08 NOTE — Patient Instructions (Addendum)
 Continue with Gabapentin 300 mg nightly  Prescription of Requip 0.25 mg nightly  Consider massage therapy since it was helpful for you  Neuropathy labs, will contact you to go over the results Return as needed

## 2023-09-08 NOTE — Progress Notes (Signed)
 [facility]  PATIENT: Amy Reyes DOB: [birth year]  REQUESTING CLINICIAN: Tisovec, Adelfa Koh, MD HISTORY FROM: Patient REASON FOR VISIT: Bilateral legs pain    HISTORICAL  CHIEF COMPLAINT:  Chief Complaint  Patient presents with   New Patient (Initial Visit)    Rm 13, NP, neuropathy know knees down and stiffness in toes and feet    HISTORY OF PRESENT ILLNESS:  This is a 73 year old woman past medical history hypertension, hyperlipidemia, prediabetes, bilateral knees replacement who is presenting with complaint of pain, nocturnal pain in bilateral legs for the past year and a half.  Patient tells me every night she will have severe pain that she describes as aching, no burning pain with need to move her leg. Sometimes she has to get up and walk and when she starts walking the pain will subside.  She tells me that her doctor has given her gabapentin 300 mg nightly which helped for about 2 weeks and then stop helping.  She has a history of prediabetes/diabetes, tells me that her A1c has never been more than 6.5.  She is on metformin.   OTHER MEDICAL CONDITIONS: Hypertension, Hyperlipidemia, Prediabetes, CKD   REVIEW OF SYSTEMS: Full 14 system review of systems performed and negative with exception of: As noted in the HPI  ALLERGIES: Allergies  Allergen Reactions   Tramadol Itching   Codeine     Does not like the side effects-post taking    HOME MEDICATIONS: Outpatient Medications Prior to Visit  Medication Sig Dispense Refill   atorvastatin (LIPITOR) 80 MG tablet Take 80 mg by mouth in the morning.     Cholecalciferol (VITAMIN D3) 50 MCG (2000 UT) TABS Take 6,000 Units by mouth in the morning.     ferrous sulfate (SLOW IRON) 160 (50 Fe) MG TBCR SR tablet Take 1 tablet by mouth in the morning.     gabapentin (NEURONTIN) 300 MG capsule Take 300 mg by mouth at bedtime.     losartan-hydrochlorothiazide (HYZAAR) 100-25 MG tablet Take 1 tablet by mouth  in the morning.     Multiple Vitamin (MULTIVITAMIN) tablet Take 1 tablet by mouth daily.     ondansetron (ZOFRAN-ODT) 4 MG disintegrating tablet Take 1 tablet (4 mg total) by mouth every 8 (eight) hours as needed for nausea or vomiting. 90 tablet 1   OZEMPIC, 1 MG/DOSE, 4 MG/3ML SOPN Inject 1 mg into the skin once a week.     vitamin C (ASCORBIC ACID) 250 MG tablet Take 750 mg by mouth in the morning.     albuterol (VENTOLIN HFA) 108 (90 Base) MCG/ACT inhaler Inhale 1-2 puffs into the lungs every 6 (six) hours as needed for wheezing or shortness of breath. (Patient not taking: Reported on 09/08/2023)     Metamucil Fiber CHEW Chew by mouth daily at 6 (six) AM. 2 gummies per day. (Patient not taking: Reported on 09/08/2023)     No facility-administered medications prior to visit.    PAST MEDICAL HISTORY: Past Medical History:  Diagnosis Date   Allergy    seasonal allergies   Anemia    Arthritis    oa   Bronchitis hx of   Chronic kidney disease    ckd stage 2- 3 due to NSAIDS use no nephrologist   Colon polyps    Complication of anesthesia    slow to wake up one time, none recent   Diabetes mellitus without complication (HCC)    borderline type 2 on metformin   History  of carpal tunnel syndrome    years ago   History of kidney stones    History of rotator cuff tear    Bilateral   Hypercholesteremia    on meds   Hypertension    on meds   Peptic ulcer    PMB (postmenopausal bleeding)    last time middle of april 2021    PAST SURGICAL HISTORY: Past Surgical History:  Procedure Laterality Date   ABDOMINOPLASTY  30 yrs ago   tummy tuck   BREAST SURGERY     reduction   CHOLECYSTECTOMY  2000   laparoscopic   COLONOSCOPY  2019   TA   COLONOSCOPY WITH PROPOFOL N/A 12/13/2020   Procedure: COLONOSCOPY WITH PROPOFOL;  Surgeon: Dolores Frame, MD;  [facility];  Service: Gastroenterology;  Laterality: N/A;  11:00   DILATATION & CURETTAGE/HYSTEROSCOPY WITH  MYOSURE N/A 12/20/2019   Procedure: DILATATION & CURETTAGE/HYSTEROSCOPY WITH MYOSURE RESECTION OF POLYP;  Surgeon: Jerene Bears, MD;  Location: [facility];  Service: Gynecology;  Laterality: N/A;  resection of polyp,  to follow first case   INJECTION KNEE Left 10/14/2016   Procedure: CORTISONE LEFT KNEE INJECTION;  Surgeon: Ollen Gross, MD;  Location: [facility];  Service: Orthopedics;  Laterality: Left;   LAPAROSCOPIC GASTRIC BANDING  2010 or 2011   left knee injection  6 weeks ago   POLYPECTOMY     TA   POLYPECTOMY  12/13/2020   Procedure: POLYPECTOMY;  Surgeon: Dolores Frame, MD;  Location: [facility];  Service: Gastroenterology;;  ascending colon   REDUCTION MAMMAPLASTY     ROTATOR CUFF REPAIR Left 05/2019   TOTAL KNEE ARTHROPLASTY Right 10/14/2016   Procedure: RIGHT TOTAL KNEE ARTHROPLASTY;  Surgeon: Ollen Gross, MD;  Location: [facility];  Service: Orthopedics;  Laterality: Right;  Adductor Block   TOTAL KNEE ARTHROPLASTY Left 04/24/2020   Procedure: TOTAL KNEE ARTHROPLASTY;  Surgeon: Ollen Gross, MD;  Location: [facility];  Service: Orthopedics;  Laterality: Left;     FAMILY HISTORY: Family History  Problem Relation Age of Onset   Hypertension Mother    Thyroid disease Mother    Kidney disease Sister    Hypertension Sister    Diabetes Maternal Grandfather    Diabetes Maternal Aunt    Diabetes Maternal Uncle    Prostate cancer Father 8   Colon cancer Father 14   Colon polyps Father 59   Hypertension Brother    Uterine cancer Maternal Aunt    Rectal cancer Neg Hx    Stomach cancer Neg Hx     SOCIAL HISTORY: Social History   Socioeconomic History   Marital status: Widowed    Spouse Reyes: 2   Number of children: Not on file   Years of education: Not on file   Highest education level: Not on file  Occupational History   Occupation: retired  Tobacco Use   Smoking status: Former    Current packs/day: 0.00    Types: Cigarettes     Start date: 05/15/1982    Quit date: 05/16/1983    Years since quitting: 40.3   Smokeless tobacco: Never  Vaping Use   Vaping status: Never Used  Substance and Sexual Activity   Alcohol use: Never    Alcohol/week: 2.0 standard drinks of alcohol    Types: 2 Glasses of wine per week   Drug use: No   Sexual activity: Yes    Birth control/protection: Post-menopausal    Comment: rare  Other Topics Concern   Not on file  Social History Narrative   Right handed   Lives alone with grandchild   Retired, Psychologist, sport and exercise, Airline pilot   Social Drivers of Corporate investment banker Strain: Not on [employer] Insecurity: Not on file  Transportation Needs: Not on file  Physical Activity: Not on file  Stress: Not on file  Social Connections: Not on file  Intimate Partner Violence: Not on file    PHYSICAL EXAM  GENERAL EXAM/CONSTITUTIONAL: Vitals:  Vitals:   09/08/23 0903  BP: 126/78  Weight: 201 lb (91.2 kg)  Height: 5\' 6"  (1.676 m)   Body mass index is 32.44 kg/m. Wt Readings from Last 3 Encounters:  09/08/23 201 lb (91.2 kg)  12/27/22 209 lb (94.8 kg)  03/21/22 203 lb (92.1 kg)   Patient is in no distress; well developed, nourished and groomed; neck is supple  MUSCULOSKELETAL: Gait, strength, tone, movements noted in Neurologic exam below  NEUROLOGIC: MENTAL STATUS:      No data to display         awake, alert, oriented to person, place and time recent and remote memory intact normal attention and concentration language fluent, comprehension intact, naming intact fund of knowledge appropriate  CRANIAL NERVE:  2nd, 3rd, 4th, 6th - Visual fields full to confrontation, extraocular muscles intact, no nystagmus 5th - facial sensation symmetric 7th - facial strength symmetric 8th - hearing intact 9th - palate elevates symmetrically, uvula midline 11th - shoulder shrug symmetric 12th - tongue protrusion midline  MOTOR:  normal bulk and tone, full strength in the BUE,  BLE  SENSORY:  normal and symmetric to light touch, decrease pinprick and vibration up to ankles.   COORDINATION:  finger-nose-finger, fine finger movements normal  REFLEXES:  deep tendon reflexes present and symmetric  GAIT/STATION:  normal     DIAGNOSTIC DATA (LABS, IMAGING, TESTING) - I reviewed patient records, labs, notes, testing and imaging myself where available.  Lab Results  Component Value Date   WBC 8.8 03/17/2021   HGB 9.7 (L) 03/17/2021   HCT 30.4 (L) 03/17/2021   MCV 83.7 03/17/2021   PLT 240 03/17/2021      Component Value Date/Time   NA 133 (L) 03/17/2021 0448   K 4.2 03/17/2021 0448   CL 100 03/17/2021 0448   CO2 26 03/17/2021 0448   GLUCOSE 110 (H) 03/17/2021 0448   BUN 27 (H) 03/17/2021 0448   CREATININE 1.78 (H) 03/17/2021 0448   CALCIUM 8.5 (L) 03/17/2021 0448   PROT 8.0 04/30/2020 1920   ALBUMIN 3.6 04/30/2020 1920   AST 17 04/30/2020 1920   ALT 14 04/30/2020 1920   ALKPHOS 74 04/30/2020 1920   BILITOT 1.3 (H) 04/30/2020 1920   GFRNONAA 30 (L) 03/17/2021 0448   GFRAA >60 04/13/2020 1209   No results found for: "CHOL", "HDL", "LDLCALC", "LDLDIRECT", "TRIG", "CHOLHDL" Lab Results  Component Value Date   HGBA1C 6.2 (H) 03/01/2021   Lab Results  Component Value Date   VITAMINB12 861 02/17/2019   No results found for: "TSH"    ASSESSMENT AND PLAN  73 y.o. year old female with hypertension, hyperlipidemia, diabetes, CKD who is presenting with bilateral leg pain at night, needing to move her leg or getting out of bed and walking.  On exam she does have decreased pinprick and vibration up to ankle, but she does not complaint of feet pain.  Based on description, this is more consistent with restless leg syndrome.  She  had tried gabapentin with some improvement initially.  Plan will be for patient to continue with gabapentin 300 mg, will not increase the dose due to her CKD and to add low-dose Requip.  She tells me that she does not like to  take medication.  She did massage therapy in the past with good improvement of her symptoms.  Advised her to continue with massage therapy for now and to use the Requip if needed.  She voiced understanding.  Continue to follow with PCP and return as needed.   1. Restless leg syndrome   2. Peripheral polyneuropathy      Patient Instructions  Continue with Gabapentin 300 mg nightly  Prescription of Requip 0.25 mg nightly  Consider massage therapy since it was helpful for you  Neuropathy labs, will contact you to go over the results Return as needed   Orders Placed This Encounter  Procedures   Neuropathy Panel   Copper, serum    Meds ordered this encounter  Medications   rOPINIRole (REQUIP) 0.25 MG tablet    Sig: Take 1 tablet (0.25 mg total) by mouth at bedtime.    Dispense:  30 tablet    Refill:  0    Return if symptoms worsen or fail to improve.    Windell Norfolk, MD 09/08/2023, 2:43 PM  [facility] [address], [address] [place], [place] [address] [phone]

## 2023-09-11 LAB — NEUROPATHY PANEL
A/G Ratio: 1.2 (ref 0.7–1.7)
Albumin ELP: 3.8 g/dL (ref 2.9–4.4)
Alpha 1: 0.2 g/dL (ref 0.0–0.4)
Alpha 2: 0.9 g/dL (ref 0.4–1.0)
Angio Convert Enzyme: 24 U/L (ref 14–82)
Anti Nuclear Antibody (ANA): NEGATIVE
Beta: 1 g/dL (ref 0.7–1.3)
Gamma Globulin: 1 g/dL (ref 0.4–1.8)
Globulin, Total: 3.1 g/dL (ref 2.2–3.9)
Rheumatoid fact SerPl-aCnc: <10 [IU]/mL (ref <14.0)
Sed Rate: 15 mm/h (ref 0–40)
TSH: 0.672 u[IU]/mL (ref 0.450–4.500)
Total Protein: 6.9 g/dL (ref 6.0–8.5)
Vit D, 25-Hydroxy: 50.1 ng/mL (ref 30.0–100.0)
Vitamin B-12: 764 pg/mL (ref 232–1245)

## 2023-09-11 LAB — COPPER, SERUM: Copper: 101 ug/dL (ref 80–158)

## 2023-09-12 ENCOUNTER — Other Ambulatory Visit: Payer: Self-pay | Admitting: Neurology

## 2023-09-12 ENCOUNTER — Encounter: Payer: Self-pay | Admitting: Neurology

## 2023-09-12 DIAGNOSIS — G2581 Restless legs syndrome: Secondary | ICD-10-CM

## 2023-09-20 DIAGNOSIS — H109 Unspecified conjunctivitis: Secondary | ICD-10-CM | POA: Diagnosis not present

## 2023-09-20 DIAGNOSIS — E669 Obesity, unspecified: Secondary | ICD-10-CM | POA: Diagnosis not present

## 2023-09-20 DIAGNOSIS — Z6832 Body mass index (BMI) 32.0-32.9, adult: Secondary | ICD-10-CM | POA: Diagnosis not present

## 2023-09-25 ENCOUNTER — Other Ambulatory Visit (INDEPENDENT_AMBULATORY_CARE_PROVIDER_SITE_OTHER): Payer: Self-pay

## 2023-09-25 DIAGNOSIS — Z0289 Encounter for other administrative examinations: Secondary | ICD-10-CM

## 2023-09-25 DIAGNOSIS — G2581 Restless legs syndrome: Secondary | ICD-10-CM

## 2023-09-26 LAB — IRON,TIBC AND FERRITIN PANEL
Ferritin: 80 ng/mL (ref 15–150)
Iron Saturation: 14 % — ABNORMAL LOW (ref 15–55)
Iron: 39 ug/dL (ref 27–139)
Total Iron Binding Capacity: 279 ug/dL (ref 250–450)
UIBC: 240 ug/dL (ref 118–369)

## 2023-09-30 ENCOUNTER — Other Ambulatory Visit: Payer: Self-pay | Admitting: Neurology

## 2023-10-01 DIAGNOSIS — H5203 Hypermetropia, bilateral: Secondary | ICD-10-CM | POA: Diagnosis not present

## 2023-10-02 DIAGNOSIS — Z96651 Presence of right artificial knee joint: Secondary | ICD-10-CM | POA: Diagnosis not present

## 2023-10-02 DIAGNOSIS — D631 Anemia in chronic kidney disease: Secondary | ICD-10-CM | POA: Diagnosis not present

## 2023-10-02 DIAGNOSIS — J302 Other seasonal allergic rhinitis: Secondary | ICD-10-CM | POA: Diagnosis not present

## 2023-10-02 DIAGNOSIS — I129 Hypertensive chronic kidney disease with stage 1 through stage 4 chronic kidney disease, or unspecified chronic kidney disease: Secondary | ICD-10-CM | POA: Diagnosis not present

## 2023-10-02 DIAGNOSIS — G629 Polyneuropathy, unspecified: Secondary | ICD-10-CM | POA: Diagnosis not present

## 2023-10-02 DIAGNOSIS — F321 Major depressive disorder, single episode, moderate: Secondary | ICD-10-CM | POA: Diagnosis not present

## 2023-10-02 DIAGNOSIS — E7800 Pure hypercholesterolemia, unspecified: Secondary | ICD-10-CM | POA: Diagnosis not present

## 2023-10-02 DIAGNOSIS — D692 Other nonthrombocytopenic purpura: Secondary | ICD-10-CM | POA: Diagnosis not present

## 2023-10-02 DIAGNOSIS — N1831 Chronic kidney disease, stage 3a: Secondary | ICD-10-CM | POA: Diagnosis not present

## 2023-10-02 DIAGNOSIS — Z9884 Bariatric surgery status: Secondary | ICD-10-CM | POA: Diagnosis not present

## 2023-10-02 DIAGNOSIS — E1129 Type 2 diabetes mellitus with other diabetic kidney complication: Secondary | ICD-10-CM | POA: Diagnosis not present

## 2023-10-03 ENCOUNTER — Encounter (INDEPENDENT_AMBULATORY_CARE_PROVIDER_SITE_OTHER): Payer: Self-pay | Admitting: *Deleted

## 2023-10-07 ENCOUNTER — Encounter: Payer: Self-pay | Admitting: Student-PharmD

## 2023-10-07 NOTE — Progress Notes (Signed)
 Patient has been approved for [employer] assistance through [employer] for the remainder of 2025. Medication will continue to be shipped to PCP office. Most recent A1C 5.5 on 10/02/2023.

## 2023-10-10 DIAGNOSIS — M7062 Trochanteric bursitis, left hip: Secondary | ICD-10-CM | POA: Diagnosis not present

## 2023-10-10 DIAGNOSIS — M25551 Pain in right hip: Secondary | ICD-10-CM | POA: Diagnosis not present

## 2023-10-18 DIAGNOSIS — R609 Edema, unspecified: Secondary | ICD-10-CM | POA: Diagnosis not present

## 2023-10-21 ENCOUNTER — Other Ambulatory Visit: Payer: Self-pay | Admitting: Orthopedic Surgery

## 2023-10-21 DIAGNOSIS — M25551 Pain in right hip: Secondary | ICD-10-CM

## 2023-10-22 DIAGNOSIS — H353132 Nonexudative age-related macular degeneration, bilateral, intermediate dry stage: Secondary | ICD-10-CM | POA: Diagnosis not present

## 2023-10-29 ENCOUNTER — Ambulatory Visit
Admission: RE | Admit: 2023-10-29 | Discharge: 2023-10-29 | Disposition: A | Discharge status: Home / Self Care | Source: Ambulatory Visit | Attending: Orthopedic Surgery | Admitting: Orthopedic Surgery

## 2023-10-29 DIAGNOSIS — M1611 Unilateral primary osteoarthritis, right hip: Secondary | ICD-10-CM | POA: Diagnosis not present

## 2023-10-29 DIAGNOSIS — M25551 Pain in right hip: Secondary | ICD-10-CM

## 2023-10-29 DIAGNOSIS — M7601 Gluteal tendinitis, right hip: Secondary | ICD-10-CM | POA: Diagnosis not present

## 2023-11-11 ENCOUNTER — Ambulatory Visit: Payer: Medicare HMO | Admitting: Neurology

## 2023-11-13 DIAGNOSIS — M7062 Trochanteric bursitis, left hip: Secondary | ICD-10-CM | POA: Diagnosis not present

## 2023-11-13 DIAGNOSIS — M7061 Trochanteric bursitis, right hip: Secondary | ICD-10-CM | POA: Diagnosis not present

## 2023-11-21 NOTE — Progress Notes (Signed)
 Anesthesia Review:  PCP: Cardiologist :  Neurology- DR Samara Crest- LVO 09/08/23   PPM/ ICD: Device Orders: Rep Notified:  Chest x-ray : EKG : Echo : Stress test: Cardiac Cath :   Activity level:  Sleep Study/ CPAP : Fasting Blood Sugar :      / Checks Blood Sugar -- times a day:     DM- type Hgba1c-11/27/23  Ozempic- last dose on   Blood Thinner/ Instructions /Last Dose: ASA / Instructions/ Last Dose :

## 2023-11-27 ENCOUNTER — Other Ambulatory Visit: Payer: Self-pay

## 2023-11-27 ENCOUNTER — Encounter (HOSPITAL_COMMUNITY): Payer: Self-pay

## 2023-11-27 ENCOUNTER — Encounter (HOSPITAL_COMMUNITY)
Admission: RE | Admit: 2023-11-27 | Discharge: 2023-11-27 | Disposition: A | Discharge status: Home / Self Care | Source: Ambulatory Visit | Attending: Orthopedic Surgery | Admitting: Orthopedic Surgery

## 2023-11-27 VITALS — BP 127/73 | HR 58 | Temp 98.2°F | Resp 16 | Ht 66.0 in | Wt 191.0 lb

## 2023-11-27 DIAGNOSIS — R001 Bradycardia, unspecified: Secondary | ICD-10-CM | POA: Diagnosis not present

## 2023-11-27 DIAGNOSIS — Z01818 Encounter for other preprocedural examination: Secondary | ICD-10-CM | POA: Insufficient documentation

## 2023-11-27 HISTORY — DX: Unspecified macular degeneration: H35.30

## 2023-11-27 LAB — CBC
HCT: 37.8 % (ref 36.0–46.0)
Hemoglobin: 11.9 g/dL — ABNORMAL LOW (ref 12.0–15.0)
MCH: 27.7 pg (ref 26.0–34.0)
MCHC: 31.5 g/dL (ref 30.0–36.0)
MCV: 88.1 fL (ref 80.0–100.0)
Platelets: 248 10*3/uL (ref 150–400)
RBC: 4.29 MIL/uL (ref 3.87–5.11)
RDW: 15.9 % — ABNORMAL HIGH (ref 11.5–15.5)
WBC: 8.7 10*3/uL (ref 4.0–10.5)
nRBC: 0 % (ref 0.0–0.2)

## 2023-11-27 LAB — BASIC METABOLIC PANEL WITH GFR
Anion gap: 6 (ref 5–15)
BUN: 18 mg/dL (ref 8–23)
CO2: 25 mmol/L (ref 22–32)
Calcium: 9.9 mg/dL (ref 8.9–10.3)
Chloride: 109 mmol/L (ref 98–111)
Creatinine, Ser: 1.28 mg/dL — ABNORMAL HIGH (ref 0.44–1.00)
GFR, Estimated: 44 mL/min — ABNORMAL LOW (ref >60)
Glucose, Bld: 87 mg/dL (ref 70–99)
Potassium: 4.4 mmol/L (ref 3.5–5.1)
Sodium: 140 mmol/L (ref 135–145)

## 2023-11-27 LAB — SURGICAL PCR SCREEN
MRSA, PCR: NEGATIVE
Staphylococcus aureus: NEGATIVE

## 2023-11-27 LAB — HEMOGLOBIN A1C
Hgb A1c MFr Bld: 5.7 % — ABNORMAL HIGH (ref 4.8–5.6)
Mean Plasma Glucose: 116.89 mg/dL

## 2023-11-27 LAB — GLUCOSE, CAPILLARY: Glucose-Capillary: 82 mg/dL (ref 70–99)

## 2023-11-27 NOTE — Patient Instructions (Signed)
 SURGICAL WAITING ROOM VISITATION  Patients having surgery or a procedure may have no more than 2 support people in the waiting area - these visitors may rotate.    Children under the age of 43 must have an adult with them who is not the patient.  Due to an increase in RSV and influenza rates and associated hospitalizations, children ages 69 and under may not visit patients in [facility] hospitals.  Visitors with respiratory illnesses are discouraged from visiting and should remain at home.  If the patient needs to stay at the hospital during part of their recovery, the visitor guidelines for inpatient rooms apply. Pre-op nurse will coordinate an appropriate time for 1 support person to accompany patient in pre-op.  This support person may not rotate.    Please refer to the [facility] website for the visitor guidelines for Inpatients (after your surgery is over and you are in a regular room).       Your procedure is scheduled on: 12-03-23   Report to [facility] Main Entrance    Report to admitting at      1:00  PM   Call this number if you have problems the morning of surgery [phone]   Do not eat food :After Midnight.   After Midnight you may have the following liquids until _12:00_____  PM DAY OF SURGERY   then nothing by mouth  Water  Non-Citrus Juices (without pulp, NO RED-Apple, White grape, White cranberry) Black Coffee (NO MILK/CREAM OR CREAMERS, sugar ok)  Clear Tea (NO MILK/CREAM OR CREAMERS, sugar ok) regular and decaf                             Plain Jell-O (NO RED)                                           Fruit ices (not with fruit pulp, NO RED)                                     Popsicles (NO RED)                                                               Sports drinks like Gatorade (NO RED)                    The day of surgery:  Drink ONE (1) Pre-Surgery  G2 BY 12:00  PM the morning of surgery. Drink in one sitting. Do not sip.  This drink  was given to you during your hospital  pre-op appointment visit. Nothing else to drink after completing the  Pre-Surgery  G2.          If you have questions, please contact your surgeon's office.   FOLLOW ANY ADDITIONAL PRE OP INSTRUCTIONS YOU RECEIVED FROM YOUR SURGEON'S OFFICE!!!     Oral Hygiene is also important to reduce your risk of infection.  Remember - BRUSH YOUR TEETH THE MORNING OF SURGERY WITH YOUR REGULAR TOOTHPASTE  DENTURES WILL BE REMOVED PRIOR TO SURGERY PLEASE DO NOT APPLY "Poly grip" OR ADHESIVES!!!   Do NOT smoke after Midnight   Stop all vitamins and herbal supplements 7 days before surgery.   Take these medicines the morning of surgery with A SIP OF WATER : inhaler , Lipitor  DO NOT TAKE ANY ORAL DIABETIC MEDICATIONS DAY OF YOUR SURGERY  OZEMPIC hold one week before surgery  last dose  11-25-23  Bring CPAP mask and tubing day of surgery.                              You may not have any metal on your body including hair pins, jewelry, and body piercing             Do not wear make-up, lotions, powders, perfumes/cologne, or deodorant  Do not wear nail polish including gel and S&S, artificial/acrylic nails, or any other type of covering on natural nails including finger and toenails. If you have artificial nails, gel coating, etc. that needs to be removed by a nail salon please have this removed prior to surgery or surgery may need to be canceled/ delayed if the surgeon/ anesthesia feels like they are unable to be safely monitored.   Do not shave  48 hours prior to surgery.            Do not bring valuables to the hospital. [facility] IS NOT             RESPONSIBLE   FOR VALUABLES.   Contacts, glasses, dentures or bridgework may not be worn into surgery.   Bring small overnight bag day of surgery.   DO NOT BRING YOUR HOME MEDICATIONS TO THE HOSPITAL. PHARMACY WILL DISPENSE MEDICATIONS LISTED ON YOUR MEDICATION LIST TO  YOU DURING YOUR ADMISSION IN THE HOSPITAL!    Patients discharged on the day of surgery will not be allowed to drive home.  Someone NEEDS to stay with you for the first 24 hours after anesthesia.   Special Instructions: Bring a copy of your healthcare power of attorney and living will documents the day of surgery if you haven't scanned them before.              Please read over the following fact sheets you were given: IF YOU HAVE QUESTIONS ABOUT YOUR PRE-OP INSTRUCTIONS PLEASE CALL [phone]    If you test positive for Covid or have been in contact with anyone that has tested positive in the last 10 days please notify you surgeon.    [facility] - Preparing for Surgery Before surgery, you can play an important role.  Because skin is not sterile, your skin needs to be as free of germs as possible.  You can reduce the number of germs on your skin by washing with CHG (chlorahexidine gluconate) soap before surgery.  CHG is an antiseptic cleaner which kills germs and bonds with the skin to continue killing germs even after washing. Please DO NOT use if you have an allergy to CHG or antibacterial soaps.  If your skin becomes reddened/irritated stop using the CHG and inform your nurse when you arrive at Short Stay. Do not shave (including legs and underarms) for at least 48 hours prior to the first CHG shower.  You may shave your face/neck. Please follow these instructions carefully:  1.  Shower with CHG  Soap the night before surgery and the  morning of Surgery.  2.  If you choose to wash your hair, wash your hair first as usual with your  normal  shampoo.  3.  After you shampoo, rinse your hair and body thoroughly to remove the  shampoo.                           4.  Use CHG as you would any other liquid soap.  You can apply chg directly  to the skin and wash                       Gently with a scrungie or clean washcloth.  5.  Apply the CHG Soap to your body ONLY FROM THE NECK DOWN.   Do not use  on face/ open                           Wound or open sores. Avoid contact with eyes, ears mouth and genitals (private parts).                       Wash face,  Genitals (private parts) with your normal soap.             6.  Wash thoroughly, paying special attention to the area where your surgery  will be performed.  7.  Thoroughly rinse your body with warm water  from the neck down.  8.  DO NOT shower/wash with your normal soap after using and rinsing off  the CHG Soap.                9.  Pat yourself dry with a clean towel.            10.  Wear clean pajamas.            11.  Place clean sheets on your bed the night of your first shower and do not  sleep with pets. Day of Surgery : Do not apply any lotions/deodorants the morning of surgery.  Please wear clean clothes to the hospital/surgery center.  FAILURE TO FOLLOW THESE INSTRUCTIONS MAY RESULT IN THE CANCELLATION OF YOUR SURGERY PATIENT SIGNATURE_________________________________  NURSE SIGNATURE__________________________________  ________________________________________________________________________

## 2023-11-27 NOTE — Progress Notes (Addendum)
 Anesthesia Review:HTN, DM2  PCP: Amy Costa, MD Cardiologist : no Neurology- DR. Camara- LOV 09/08/23     PPM/ ICD: Device Orders: Rep Notified:  Chest x-ray : EKG :11-27-23 Echo : Stress test: Cardiac Cath :   Activity level: Able to climb a flight of stairs with no CP or SOB   Sleep Study/ CPAP : Fasting Blood Sugar :      / Checks Blood Sugar -0 times a day:    DM- type 2  Hgba1c-11/27/23 5.7  Ozempic- last dose on 11-25-23  Blood Thinner/ Instructions /Last Dose:n/a ASA / Instructions/ Last Dose : N/a

## 2023-12-01 ENCOUNTER — Encounter (HOSPITAL_BASED_OUTPATIENT_CLINIC_OR_DEPARTMENT_OTHER): Payer: Self-pay | Admitting: Obstetrics & Gynecology

## 2023-12-01 ENCOUNTER — Other Ambulatory Visit (HOSPITAL_COMMUNITY)
Admission: RE | Admit: 2023-12-01 | Discharge: 2023-12-01 | Disposition: A | Discharge status: Home / Self Care | Source: Ambulatory Visit | Attending: Obstetrics & Gynecology | Admitting: Obstetrics & Gynecology

## 2023-12-01 ENCOUNTER — Ambulatory Visit (HOSPITAL_BASED_OUTPATIENT_CLINIC_OR_DEPARTMENT_OTHER): Admitting: Obstetrics & Gynecology

## 2023-12-01 VITALS — BP 117/67 | HR 69 | Ht 66.0 in | Wt 194.0 lb

## 2023-12-01 DIAGNOSIS — N950 Postmenopausal bleeding: Secondary | ICD-10-CM | POA: Diagnosis not present

## 2023-12-01 DIAGNOSIS — N939 Abnormal uterine and vaginal bleeding, unspecified: Secondary | ICD-10-CM | POA: Diagnosis not present

## 2023-12-01 DIAGNOSIS — D251 Intramural leiomyoma of uterus: Secondary | ICD-10-CM | POA: Diagnosis not present

## 2023-12-01 LAB — POCT URINALYSIS DIPSTICK
Bilirubin, UA: NEGATIVE
Glucose, UA: NEGATIVE
Ketones, UA: NEGATIVE
Nitrite, UA: NEGATIVE
Protein, UA: POSITIVE — AB
Spec Grav, UA: 1.02 (ref 1.010–1.025)
Urobilinogen, UA: 0.2 U/dL
pH, UA: 6.5 (ref 5.0–8.0)

## 2023-12-01 MED ORDER — PROGESTERONE MICRONIZED 100 MG PO CAPS: 100.0000 mg | ORAL_CAPSULE | Freq: Every day | ORAL | 0 refills | Status: DC

## 2023-12-01 NOTE — Progress Notes (Signed)
 GYNECOLOGY  VISIT  CC:   vaginal bleeding  HPI: 73 y.o. G3P0012 Widowed Black or [place] American female here for complaint of seeing blood when she wipes after urinating that started over the weekend.  Not sure if this is from her bladder or vaginal bleeding.  Not having cramping.  Not really having dysuria.  She isn't seeing blood in her urine.  No back pain and no fevers.   Has left hip arthroscopy scheduled for Wednesday.  She was nervous about waiting until after the procedure.    H/o hysteroscopy with polyp resection in 2021.  Does have known hx of fibroids.    Last pap 06/16/2018.   Past Medical History:  Diagnosis Date   Allergy    seasonal allergies   Anemia    Arthritis    oa   Bronchitis hx of   Chronic kidney disease    ckd stage 2- 3 due to NSAIDS use no nephrologist   Colon polyps    Complication of anesthesia    slow to wake up one time, none recent   Diabetes mellitus without complication (HCC)    borderline type 2 on metformin   History of carpal tunnel syndrome    years ago   History of kidney stones    History of rotator cuff tear    Bilateral   Hypercholesteremia    on meds   Hypertension    on meds   Macular degeneration    Peptic ulcer    PMB (postmenopausal bleeding)    last time middle of april 2021    MEDS:   Current Outpatient Medications on File Prior to Visit  Medication Sig Dispense Refill   acetaminophen  (TYLENOL ) 500 MG tablet Take 500-1,000 mg by mouth every 6 (six) hours as needed for moderate pain (pain score 4-6).     atorvastatin  (LIPITOR) 80 MG tablet Take 80 mg by mouth in the morning.     diphenhydrAMINE  (BENADRYL ) 25 MG tablet Take 25 mg by mouth every 6 (six) hours as needed for itching or allergies.     DM-Doxylamine-Acetaminophen  (NYQUIL COLD & FLU PO) Take 1 Dose by mouth every 4 (four) hours as needed (Cold/cough).     ferrous sulfate  (SLOW IRON) 160 (50 Fe) MG TBCR SR tablet Take 1 tablet by mouth in the morning.      losartan -hydrochlorothiazide  (HYZAAR) 100-25 MG tablet Take 1 tablet by mouth in the morning.     Multiple Vitamin (MULTIVITAMIN) tablet Take 1 tablet by mouth daily.     ondansetron  (ZOFRAN -ODT) 4 MG disintegrating tablet Take 1 tablet (4 mg total) by mouth every 8 (eight) hours as needed for nausea or vomiting. 90 tablet 1   OZEMPIC, 1 MG/DOSE, 4 MG/3ML SOPN Inject 2 mg into the skin once a week.     PREBIOTIC PRODUCT PO Take 1 tablet by mouth daily.     No current facility-administered medications on file prior to visit.    ALLERGIES: Tramadol  and Codeine  SH:  widowed, non smoker  Review of Systems  Constitutional: Negative.   Genitourinary:        Vaginal bleeding    PHYSICAL EXAMINATION:    BP 117/67 (BP Location: Left Arm, Patient Position: Sitting, Cuff Size: Normal)   Pulse 69   Ht 5\' 6"  (1.676 m)   Wt 194 lb (88 kg)   BMI 31.31 kg/m     General appearance: alert, cooperative and appears stated age  Lymph:  no enlarged inguinal lymph nodes  Pelvic: External genitalia:  no lesions              Urethra:  normal appearing urethra with no masses, tenderness or lesions              Bartholins and Skenes: normal                 Vagina: normal mucosa without prolapse or lesions              Cervix: no lesions and blood at os              Bimanual Exam:  Uterus:  enlarged, 8 weeks size, mobile and non tender, globular              Adnexa: no mass, fullness, tenderness  Endometrial biopsy recommended.  Discussed with patient.  Verbal and written consent obtained.   Procedure:  Speculum placed.  Cervix visualized and cleansed with betadine  prep.  A single toothed tenaculum was applied to the anterior lip of the cervix.  Endometrial pipelle was advanced through the cervix into the endometrial cavity without difficulty.  Pipelle passed to 7.5cm.  Suction applied and pipelle removed with scant tissue obtained so second pass performed.   Tenculum removed.  No bleeding noted.  Patient  tolerated procedure well.   Chaperone, Myrtie Atkinson, CMA, was present for exam.  Assessment/Plan: 1. Intramural leiomyoma of uterus (Primary)     2. Postmenopausal bleeding - pt requests medication to help with bleeding.  Progesterone  100mg  nightly but recommended she not start until after surgery but suspect this will stop on its own - Surgical pathology( [facility]/ [facility])  3. Vaginal bleeding - POCT Urinalysis Dipstick - Urine Culture

## 2023-12-02 DIAGNOSIS — N939 Abnormal uterine and vaginal bleeding, unspecified: Secondary | ICD-10-CM | POA: Diagnosis not present

## 2023-12-03 ENCOUNTER — Ambulatory Visit (HOSPITAL_COMMUNITY): Admission: RE | Admit: 2023-12-03 | Source: Ambulatory Visit | Admitting: Orthopedic Surgery

## 2023-12-03 ENCOUNTER — Ambulatory Visit (HOSPITAL_BASED_OUTPATIENT_CLINIC_OR_DEPARTMENT_OTHER): Payer: Self-pay | Admitting: Obstetrics & Gynecology

## 2023-12-03 ENCOUNTER — Encounter (HOSPITAL_COMMUNITY): Admission: RE | Payer: Self-pay | Source: Ambulatory Visit

## 2023-12-03 LAB — SURGICAL PATHOLOGY

## 2023-12-03 LAB — URINE CULTURE

## 2023-12-03 SURGERY — RELEASE, BURSA, TROCHANTERIC
Anesthesia: Choice | Site: Hip | Laterality: Left

## 2023-12-04 ENCOUNTER — Other Ambulatory Visit (HOSPITAL_BASED_OUTPATIENT_CLINIC_OR_DEPARTMENT_OTHER): Payer: Self-pay | Admitting: *Deleted

## 2023-12-04 DIAGNOSIS — N950 Postmenopausal bleeding: Secondary | ICD-10-CM

## 2023-12-04 DIAGNOSIS — D251 Intramural leiomyoma of uterus: Secondary | ICD-10-CM

## 2023-12-04 NOTE — Progress Notes (Signed)
 US  ordered per last note - US  12/17/23 KD

## 2023-12-17 ENCOUNTER — Ambulatory Visit (HOSPITAL_BASED_OUTPATIENT_CLINIC_OR_DEPARTMENT_OTHER)

## 2023-12-17 ENCOUNTER — Ambulatory Visit (HOSPITAL_BASED_OUTPATIENT_CLINIC_OR_DEPARTMENT_OTHER): Payer: Self-pay | Admitting: Obstetrics & Gynecology

## 2023-12-17 VITALS — BP 112/60 | HR 67 | Ht 66.0 in | Wt 192.0 lb

## 2023-12-17 DIAGNOSIS — N950 Postmenopausal bleeding: Secondary | ICD-10-CM

## 2023-12-17 DIAGNOSIS — D259 Leiomyoma of uterus, unspecified: Secondary | ICD-10-CM

## 2023-12-17 DIAGNOSIS — D251 Intramural leiomyoma of uterus: Secondary | ICD-10-CM | POA: Diagnosis not present

## 2023-12-17 DIAGNOSIS — R9389 Abnormal findings on diagnostic imaging of other specified body structures: Secondary | ICD-10-CM

## 2023-12-17 MED ORDER — PROGESTERONE MICRONIZED 100 MG PO CAPS: 100.0000 mg | ORAL_CAPSULE | Freq: Every day | ORAL | 1 refills | Status: DC

## 2023-12-20 ENCOUNTER — Encounter (HOSPITAL_BASED_OUTPATIENT_CLINIC_OR_DEPARTMENT_OTHER): Payer: Self-pay | Admitting: Obstetrics & Gynecology

## 2023-12-20 NOTE — Progress Notes (Signed)
 GYNECOLOGY  VISIT  CC:   Discuss ultrasound results  HPI: 73 y.o. [id number] Widowed Black or [place] American female here for discussion of ultrasound results.  Ultrasound obtained due to PMP bleeding.  Bleeding has stopped.  Endometrial biopsy was negative on 12/01/2023.  Ultrasound showed uterus measuring 12 x 8 x 9 with multiple fibroids.  Ovaries not seen well.  Endometrium was 7.52mm.  Discussed hysteroscopy with D&C due to endometrial finding.  As her bleeding has stopped, she would like to wait and see if had any bleeding again.  Discussed with pt if she does have bleeding, she should please let me know and I think we should proceed.  Unrelated, she decided to cancel her surgery because just didn't want her bursa removed.  Has difficulty sleeping on her left hip.  Saw ortho but not sports medicine.  Really doesn't want surgery if at all possible.     Past Medical History:  Diagnosis Date   Allergy    seasonal allergies   Anemia    Arthritis    oa   Bronchitis hx of   Chronic kidney disease    ckd stage 2- 3 due to NSAIDS use no nephrologist   Colon polyps    Complication of anesthesia    slow to wake up one time, none recent   Diabetes mellitus without complication (HCC)    borderline type 2 on metformin   History of carpal tunnel syndrome    years ago   History of kidney stones    History of rotator cuff tear    Bilateral   Hypercholesteremia    on meds   Hypertension    on meds   Macular degeneration    Peptic ulcer    PMB (postmenopausal bleeding)    last time middle of april 2021    MEDS:   Current Outpatient Medications on File Prior to Visit  Medication Sig Dispense Refill   acetaminophen  (TYLENOL ) 500 MG tablet Take 500-1,000 mg by mouth every 6 (six) hours as needed for moderate pain (pain score 4-6).     atorvastatin  (LIPITOR) 80 MG tablet Take 80 mg by mouth in the morning.     diphenhydrAMINE  (BENADRYL ) 25 MG tablet Take 25 mg by mouth every 6 (six) hours as  needed for itching or allergies.     DM-Doxylamine-Acetaminophen  (NYQUIL COLD & FLU PO) Take 1 Dose by mouth every 4 (four) hours as needed (Cold/cough).     ferrous sulfate  (SLOW IRON) 160 (50 Fe) MG TBCR SR tablet Take 1 tablet by mouth in the morning.     losartan -hydrochlorothiazide  (HYZAAR) 100-25 MG tablet Take 1 tablet by mouth in the morning.     Multiple Vitamin (MULTIVITAMIN) tablet Take 1 tablet by mouth daily.     ondansetron  (ZOFRAN -ODT) 4 MG disintegrating tablet Take 1 tablet (4 mg total) by mouth every 8 (eight) hours as needed for nausea or vomiting. 90 tablet 1   OZEMPIC, 1 MG/DOSE, 4 MG/3ML SOPN Inject 2 mg into the skin once a week.     PREBIOTIC PRODUCT PO Take 1 tablet by mouth daily.     No current facility-administered medications on file prior to visit.    ALLERGIES: Tramadol  and Codeine  SH:  widowed, non smoker  Review of Systems  Constitutional: Negative.   Genitourinary:        H/o PMP bleeding    PHYSICAL EXAMINATION:    BP 112/60 (BP Location: Left Arm, Patient Position: Sitting, Cuff Size: Normal)  Pulse 67   Ht 5\' 6"  (1.676 m)   Wt 192 lb (87.1 kg)   BMI 30.99 kg/m      Physical Exam Constitutional:      Appearance: Normal appearance.  Neurological:     General: No focal deficit present.     Mental Status: She is alert.  Psychiatric:        Mood and Affect: Mood normal.     Assessment/Plan: 1. Postmenopausal bleeding (Primary) - will give prometrium  100mg  nightly.  Rx to pharmacy.   - pt states she will call with any future bleeding and would proceed with surgery.  She is ok with me planning procedure for later in summer and we can cancel if doesn't any any more bleeding as she really does not want surgery  2. Thickened endometrium  3. Intramural leiomyoma of uterus

## 2023-12-26 ENCOUNTER — Ambulatory Visit: Admitting: Podiatry

## 2023-12-26 DIAGNOSIS — R200 Anesthesia of skin: Secondary | ICD-10-CM | POA: Diagnosis not present

## 2023-12-26 DIAGNOSIS — R202 Paresthesia of skin: Secondary | ICD-10-CM

## 2023-12-26 DIAGNOSIS — L853 Xerosis cutis: Secondary | ICD-10-CM

## 2023-12-26 MED ORDER — AMMONIUM LACTATE 12 % EX LOTN: 1.0000 | TOPICAL_LOTION | CUTANEOUS | 0 refills | Status: DC | PRN

## 2023-12-26 MED ORDER — LIDOCAINE 5 % EX OINT: 1.0000 | TOPICAL_OINTMENT | CUTANEOUS | 0 refills | Status: DC | PRN

## 2023-12-26 NOTE — Progress Notes (Signed)
 Subjective:  Patient ID: Amy Reyes, female    DOB: September 22, 1950,  MRN: [id number]  Chief Complaint  Patient presents with   Numbness    73 y.o. female presents with the above complaint.  Patient presents with primary complaint of dry skin to both of her feet.  She is a diabetic wanted to discuss treatment options for dry feet.  She has tried over-the-counter lotion which has not helped.  She has secondary complaint of numbness tingling to her toes.  This could likely be attributed to underlying diabetes.  She wanted discuss treatment options for that does bother her mostly at nighttime.  Has not seen him for this prior to seeing me denies any other acute complaints   Review of Systems: Negative except as noted in the HPI. Denies N/V/F/Ch.  Past Medical History:  Diagnosis Date   Allergy    seasonal allergies   Anemia    Arthritis    oa   Bronchitis hx of   Chronic kidney disease    ckd stage 2- 3 due to NSAIDS use no nephrologist   Colon polyps    Complication of anesthesia    slow to wake up one time, none recent   Diabetes mellitus without complication (HCC)    borderline type 2 on metformin   History of carpal tunnel syndrome    years ago   History of kidney stones    History of rotator cuff tear    Bilateral   Hypercholesteremia    on meds   Hypertension    on meds   Macular degeneration    Peptic ulcer    PMB (postmenopausal bleeding)    last time middle of april 2021    Current Outpatient Medications:    ammonium lactate  (AMLACTIN DAILY) 12 % lotion, Apply 1 Application topically as needed., Disp: 400 g, Rfl: 0   lidocaine  (XYLOCAINE ) 5 % ointment, Apply 1 Application topically as needed., Disp: 35.44 g, Rfl: 0   acetaminophen  (TYLENOL ) 500 MG tablet, Take 500-1,000 mg by mouth every 6 (six) hours as needed for moderate pain (pain score 4-6)., Disp: , Rfl:    atorvastatin  (LIPITOR) 80 MG tablet, Take 80 mg by mouth in the morning., Disp: , Rfl:     diphenhydrAMINE  (BENADRYL ) 25 MG tablet, Take 25 mg by mouth every 6 (six) hours as needed for itching or allergies., Disp: , Rfl:    DM-Doxylamine-Acetaminophen  (NYQUIL COLD & FLU PO), Take 1 Dose by mouth every 4 (four) hours as needed (Cold/cough)., Disp: , Rfl:    ferrous sulfate  (SLOW IRON) 160 (50 Fe) MG TBCR SR tablet, Take 1 tablet by mouth in the morning., Disp: , Rfl:    losartan -hydrochlorothiazide  (HYZAAR) 100-25 MG tablet, Take 1 tablet by mouth in the morning., Disp: , Rfl:    Multiple Vitamin (MULTIVITAMIN) tablet, Take 1 tablet by mouth daily., Disp: , Rfl:    ondansetron  (ZOFRAN -ODT) 4 MG disintegrating tablet, Take 1 tablet (4 mg total) by mouth every 8 (eight) hours as needed for nausea or vomiting., Disp: 90 tablet, Rfl: 1   OZEMPIC, 1 MG/DOSE, 4 MG/3ML SOPN, Inject 2 mg into the skin once a week., Disp: , Rfl:    PREBIOTIC PRODUCT PO, Take 1 tablet by mouth daily., Disp: , Rfl:    progesterone  (PROMETRIUM ) 100 MG capsule, Take 1 capsule (100 mg total) by mouth at bedtime., Disp: 30 capsule, Rfl: 1  Social History   Tobacco Use  Smoking Status Former   Current packs/day:  0.00   Types: Cigarettes   Start date: 05/15/1982   Quit date: 05/16/1983   Years since quitting: 40.6  Smokeless Tobacco Never    Allergies  Allergen Reactions   Tramadol  Itching   Codeine     Does not like the side effects-post taking   Objective:  There were no vitals filed for this visit. There is no height or weight on file to calculate BMI. Constitutional Well developed. Well nourished.  Vascular Dorsalis pedis pulses palpable bilaterally. Posterior tibial pulses palpable bilaterally. Capillary refill normal to all digits.  No cyanosis or clubbing noted. Pedal hair growth normal.  Neurologic Normal speech. Oriented to person, place, and time. Component of numbness and tingling noted.  No tarsal tunnel is noted.  Protective sensation is slightly diminished  Dermatologic Moderate  dryness/xerosis noted to both of her skin.  No open wounds or lesion noted no fissures noted.  Subject  Orthopedic: Normal joint ROM without pain or crepitus bilaterally. No visible deformities. No bony tenderness.   Radiographs: None Assessment:  No diagnosis found. Plan:  Patient was evaluated and treated and all questions answered.  Bilateral xerosis -I explained to the patient the etiology of xerosis and various treatment options were extensively discussed.  I explained to the patient the importance of maintaining moisturization of the skin with application of over-the-counter lotion such as Eucerin or Luciderm.  However due to the patient has failed over-the-counter options patient will benefit from ammonium lactate  to be sent to the pharmacy ammonium lactate  was sent she will apply twice a day  Numbness tingling to both feet - I believe patient benefit from lidocaine  cream to the affected area she agrees with the plan like to proceed with that   No follow-ups on file.

## 2024-01-14 DIAGNOSIS — G629 Polyneuropathy, unspecified: Secondary | ICD-10-CM | POA: Diagnosis not present

## 2024-01-14 DIAGNOSIS — Z9884 Bariatric surgery status: Secondary | ICD-10-CM | POA: Diagnosis not present

## 2024-01-14 DIAGNOSIS — E7800 Pure hypercholesterolemia, unspecified: Secondary | ICD-10-CM | POA: Diagnosis not present

## 2024-01-14 DIAGNOSIS — E1129 Type 2 diabetes mellitus with other diabetic kidney complication: Secondary | ICD-10-CM | POA: Diagnosis not present

## 2024-01-14 DIAGNOSIS — R809 Proteinuria, unspecified: Secondary | ICD-10-CM | POA: Diagnosis not present

## 2024-01-14 DIAGNOSIS — N1831 Chronic kidney disease, stage 3a: Secondary | ICD-10-CM | POA: Diagnosis not present

## 2024-01-14 DIAGNOSIS — D631 Anemia in chronic kidney disease: Secondary | ICD-10-CM | POA: Diagnosis not present

## 2024-01-14 DIAGNOSIS — Z96651 Presence of right artificial knee joint: Secondary | ICD-10-CM | POA: Diagnosis not present

## 2024-01-14 DIAGNOSIS — I129 Hypertensive chronic kidney disease with stage 1 through stage 4 chronic kidney disease, or unspecified chronic kidney disease: Secondary | ICD-10-CM | POA: Diagnosis not present

## 2024-01-20 ENCOUNTER — Other Ambulatory Visit (HOSPITAL_BASED_OUTPATIENT_CLINIC_OR_DEPARTMENT_OTHER): Payer: Self-pay | Admitting: Obstetrics & Gynecology

## 2024-01-29 ENCOUNTER — Telehealth: Payer: Self-pay

## 2024-01-29 NOTE — Telephone Encounter (Signed)
 I left a voicemail notifying the patient of Dr. Dianne opening on 03/16/24. I requested the patient call me back to schedule at [phone].

## 2024-02-03 ENCOUNTER — Telehealth: Payer: Self-pay

## 2024-02-03 NOTE — Telephone Encounter (Signed)
 Room 1, ozempic per protocol Thanks

## 2024-02-03 NOTE — Telephone Encounter (Signed)
 LMOVM to call back

## 2024-02-03 NOTE — Telephone Encounter (Signed)
 Who is your primary care physician: Dr.Richard Tisovac  Reasons for the colonoscopy: Hx polyps  Have you had a colonoscopy before?  Yes 12-13-2020 Dr.Casteneda  Do you have family history of colon cancer? yes  Previous colonoscopy with polyps removed? yes  Do you have a history colorectal cancer?   no  Are you diabetic? If yes, Type 1 or Type 2?    Yes type 2 no  Do you have a prosthetic or mechanical heart valve? no  Do you have a pacemaker/defibrillator?   no  Have you had endocarditis/atrial fibrillation? no  Have you had joint replacement within the last 12 months?  no  Do you tend to be constipated or have to use laxatives? no  Do you have any history of drugs or alchohol?  no  Do you use supplemental oxygen ?  no  Have you had a stroke or heart attack within the last 6 months? no  Do you take weight loss medication?  Yes   For female patients: have you had a hysterectomy?                                       are you post menopausal?       yes                                            do you still have your menstrual cycle? no      Do you take any blood-thinning medications such as: (aspirin, warfarin, Plavix, Aggrenox)  no  If yes we need the name, milligram, dosage and who is prescribing doctor  Current Outpatient Medications on File Prior to Visit  Medication Sig Dispense Refill   acetaminophen  (TYLENOL ) 500 MG tablet Take 500-1,000 mg by mouth every 6 (six) hours as needed for moderate pain (pain score 4-6).     ammonium lactate  (AMLACTIN DAILY) 12 % lotion Apply 1 Application topically as needed. 400 g 0   atorvastatin  (LIPITOR) 80 MG tablet Take 80 mg by mouth in the morning.     diphenhydrAMINE  (BENADRYL ) 25 MG tablet Take 25 mg by mouth every 6 (six) hours as needed for itching or allergies.     DM-Doxylamine-Acetaminophen  (NYQUIL COLD & FLU PO) Take 1 Dose by mouth every 4 (four) hours as needed (Cold/cough).     ferrous sulfate  (SLOW IRON) 160 (50 Fe) MG  TBCR SR tablet Take 1 tablet by mouth in the morning.     lidocaine  (XYLOCAINE ) 5 % ointment Apply 1 Application topically as needed. 35.44 g 0   losartan -hydrochlorothiazide  (HYZAAR) 100-25 MG tablet Take 1 tablet by mouth in the morning.     Multiple Vitamin (MULTIVITAMIN) tablet Take 1 tablet by mouth daily.     ondansetron  (ZOFRAN -ODT) 4 MG disintegrating tablet Take 1 tablet (4 mg total) by mouth every 8 (eight) hours as needed for nausea or vomiting. 90 tablet 1   OZEMPIC, 1 MG/DOSE, 4 MG/3ML SOPN Inject 2 mg into the skin once a week.     PREBIOTIC PRODUCT PO Take 1 tablet by mouth daily.     progesterone  (PROMETRIUM ) 100 MG capsule TAKE 1 CAPSULE BY MOUTH AT BEDTIME. 90 capsule 1   No current facility-administered medications on file prior to visit.    Allergies  Allergen Reactions  Tramadol  Itching   Codeine     Does not like the side effects-post taking     Pharmacy: [facility] [address] [place]  Primary Insurance Name: [facility]  Best number where you can be reached: [phone]

## 2024-02-04 ENCOUNTER — Encounter (HOSPITAL_BASED_OUTPATIENT_CLINIC_OR_DEPARTMENT_OTHER): Payer: Self-pay

## 2024-02-05 ENCOUNTER — Encounter: Payer: Self-pay | Admitting: *Deleted

## 2024-02-05 ENCOUNTER — Other Ambulatory Visit: Payer: Self-pay | Admitting: *Deleted

## 2024-02-05 DIAGNOSIS — Z860100 Personal history of colon polyps, unspecified: Secondary | ICD-10-CM

## 2024-02-05 MED ORDER — NA SULFATE-K SULFATE-MG SULF 17.5-3.13-1.6 GM/177ML PO SOLN: ORAL | 0 refills | Status: DC

## 2024-02-05 NOTE — Telephone Encounter (Signed)
 Questionnaire from recall, no referral needed

## 2024-02-05 NOTE — Telephone Encounter (Signed)
 Pt has been scheduled for 02/20/24. Instructions sent via mychart and prep sent to the pharmacy

## 2024-02-09 ENCOUNTER — Telehealth (INDEPENDENT_AMBULATORY_CARE_PROVIDER_SITE_OTHER): Payer: Self-pay | Admitting: Gastroenterology

## 2024-02-09 NOTE — Telephone Encounter (Signed)
 Pt left voicemail wanting to know if it is ok to take cortisone shot on 02/18/24 and have TCS on 02/20/24. Returned call to pt and advised that it is ok to take cortisone shot on 02/18/24. Pt verbalized understanding.

## 2024-02-10 ENCOUNTER — Other Ambulatory Visit: Payer: Self-pay | Admitting: *Deleted

## 2024-02-10 ENCOUNTER — Encounter: Payer: Self-pay | Admitting: *Deleted

## 2024-02-10 MED ORDER — SUTAB 1479-225-188 MG PO TABS: 12.0000 | ORAL_TABLET | ORAL | 0 refills | Status: DC

## 2024-02-10 NOTE — Telephone Encounter (Signed)
 Pt called back and stated she had picked up the prep but wanted to know if sutab  could be sent in because she does have a hard time drinking the liquid. Advised pt that her insurance will not pay for the sutab . She asked that it be sent in and she would call her insurance and talk with them regarding coverage. She says if for some reason insurance won't pay then she will do the liquid prep. Instructions for sutab  sent via mychart.

## 2024-02-11 NOTE — Telephone Encounter (Signed)
 Pt came to window asking about the prep being sent in to the pharmacy because she had wanted to do sutab . Told pt that I would contact pharmacy regarding prep.Called pharmacy and they stated that suprep was ready to be picked up

## 2024-02-12 NOTE — Telephone Encounter (Signed)
 Prior authorization faxed to [facility] for suprep

## 2024-02-17 ENCOUNTER — Other Ambulatory Visit (HOSPITAL_BASED_OUTPATIENT_CLINIC_OR_DEPARTMENT_OTHER): Payer: Self-pay | Admitting: Obstetrics & Gynecology

## 2024-02-17 ENCOUNTER — Other Ambulatory Visit (HOSPITAL_COMMUNITY)
Admission: RE | Admit: 2024-02-17 | Discharge: 2024-02-17 | Disposition: A | Discharge status: Home / Self Care | Source: Ambulatory Visit | Attending: Gastroenterology | Admitting: Gastroenterology

## 2024-02-17 DIAGNOSIS — Z860100 Personal history of colon polyps, unspecified: Secondary | ICD-10-CM | POA: Diagnosis not present

## 2024-02-17 DIAGNOSIS — R9389 Abnormal findings on diagnostic imaging of other specified body structures: Secondary | ICD-10-CM

## 2024-02-17 DIAGNOSIS — Z09 Encounter for follow-up examination after completed treatment for conditions other than malignant neoplasm: Secondary | ICD-10-CM | POA: Diagnosis not present

## 2024-02-17 DIAGNOSIS — N950 Postmenopausal bleeding: Secondary | ICD-10-CM

## 2024-02-17 LAB — BASIC METABOLIC PANEL WITH GFR
Anion gap: 10 (ref 5–15)
BUN: 9 mg/dL (ref 8–23)
CO2: 24 mmol/L (ref 22–32)
Calcium: 9.3 mg/dL (ref 8.9–10.3)
Chloride: 107 mmol/L (ref 98–111)
Creatinine, Ser: 1.05 mg/dL — ABNORMAL HIGH (ref 0.44–1.00)
GFR, Estimated: 56 mL/min — ABNORMAL LOW (ref >60)
Glucose, Bld: 91 mg/dL (ref 70–99)
Potassium: 3.8 mmol/L (ref 3.5–5.1)
Sodium: 141 mmol/L (ref 135–145)

## 2024-02-18 ENCOUNTER — Telehealth (HOSPITAL_BASED_OUTPATIENT_CLINIC_OR_DEPARTMENT_OTHER): Payer: Self-pay

## 2024-02-18 DIAGNOSIS — M1611 Unilateral primary osteoarthritis, right hip: Secondary | ICD-10-CM | POA: Diagnosis not present

## 2024-02-18 NOTE — Telephone Encounter (Signed)
 Spoke with patient and advised her to not take Ozempic dose week of procedure on 03/16/24, and that this should be stopped no less than 7 days prior. She voiced understanding. Nothing further needed at this time.

## 2024-02-18 NOTE — Telephone Encounter (Signed)
-----   Message from [name] sent at 02/17/2024  5:21 PM EDT ----- Regarding: patient information prior to surgery Amy Reyes, Could you call this pt?  She is scheduled for outpatient hysteroscopy with possible polyp resection on 9/2.  She is on a GLP1 and needs to stop this 7 days prior to the surgery.  So she should skip the dosage the week prior to surgery.  Thanks.  Elvie Pinal

## 2024-02-20 ENCOUNTER — Ambulatory Visit (HOSPITAL_COMMUNITY)
Admission: RE | Admit: 2024-02-20 | Discharge: 2024-02-20 | Disposition: A | Discharge status: Home / Self Care | Attending: Gastroenterology | Admitting: Gastroenterology

## 2024-02-20 ENCOUNTER — Ambulatory Visit (HOSPITAL_BASED_OUTPATIENT_CLINIC_OR_DEPARTMENT_OTHER): Admitting: Certified Registered"

## 2024-02-20 ENCOUNTER — Encounter (HOSPITAL_COMMUNITY)
Admission: RE | Disposition: A | Discharge status: Home / Self Care | Payer: Self-pay | Source: Home / Self Care | Attending: Gastroenterology

## 2024-02-20 ENCOUNTER — Ambulatory Visit (HOSPITAL_COMMUNITY): Admitting: Certified Registered"

## 2024-02-20 ENCOUNTER — Encounter (HOSPITAL_COMMUNITY): Payer: Self-pay | Admitting: Gastroenterology

## 2024-02-20 ENCOUNTER — Other Ambulatory Visit: Payer: Self-pay

## 2024-02-20 DIAGNOSIS — T39395A Adverse effect of other nonsteroidal anti-inflammatory drugs [NSAID], initial encounter: Secondary | ICD-10-CM | POA: Diagnosis not present

## 2024-02-20 DIAGNOSIS — K623 Rectal prolapse: Secondary | ICD-10-CM | POA: Insufficient documentation

## 2024-02-20 DIAGNOSIS — Z1211 Encounter for screening for malignant neoplasm of colon: Secondary | ICD-10-CM

## 2024-02-20 DIAGNOSIS — E66813 Obesity, class 3: Secondary | ICD-10-CM | POA: Diagnosis not present

## 2024-02-20 DIAGNOSIS — Z980 Intestinal bypass and anastomosis status: Secondary | ICD-10-CM | POA: Diagnosis not present

## 2024-02-20 DIAGNOSIS — Z6830 Body mass index (BMI) 30.0-30.9, adult: Secondary | ICD-10-CM | POA: Insufficient documentation

## 2024-02-20 DIAGNOSIS — Z7984 Long term (current) use of oral hypoglycemic drugs: Secondary | ICD-10-CM | POA: Diagnosis not present

## 2024-02-20 DIAGNOSIS — E119 Type 2 diabetes mellitus without complications: Secondary | ICD-10-CM | POA: Insufficient documentation

## 2024-02-20 DIAGNOSIS — Z860101 Personal history of adenomatous and serrated colon polyps: Secondary | ICD-10-CM | POA: Insufficient documentation

## 2024-02-20 DIAGNOSIS — K644 Residual hemorrhoidal skin tags: Secondary | ICD-10-CM | POA: Diagnosis not present

## 2024-02-20 DIAGNOSIS — Z8711 Personal history of peptic ulcer disease: Secondary | ICD-10-CM | POA: Diagnosis not present

## 2024-02-20 DIAGNOSIS — Z09 Encounter for follow-up examination after completed treatment for conditions other than malignant neoplasm: Secondary | ICD-10-CM | POA: Diagnosis not present

## 2024-02-20 DIAGNOSIS — K648 Other hemorrhoids: Secondary | ICD-10-CM | POA: Insufficient documentation

## 2024-02-20 DIAGNOSIS — N183 Chronic kidney disease, stage 3 unspecified: Secondary | ICD-10-CM | POA: Insufficient documentation

## 2024-02-20 DIAGNOSIS — Z87891 Personal history of nicotine dependence: Secondary | ICD-10-CM | POA: Insufficient documentation

## 2024-02-20 DIAGNOSIS — Z7985 Long-term (current) use of injectable non-insulin antidiabetic drugs: Secondary | ICD-10-CM | POA: Insufficient documentation

## 2024-02-20 DIAGNOSIS — I10 Essential (primary) hypertension: Secondary | ICD-10-CM | POA: Diagnosis not present

## 2024-02-20 HISTORY — PX: COLONOSCOPY: SHX5424

## 2024-02-20 LAB — GLUCOSE, CAPILLARY: Glucose-Capillary: 88 mg/dL (ref 70–99)

## 2024-02-20 SURGERY — COLONOSCOPY
Anesthesia: General

## 2024-02-20 MED ORDER — LACTATED RINGERS IV SOLN: INTRAVENOUS | Status: DC | PRN

## 2024-02-20 MED ORDER — LIDOCAINE 2% (20 MG/ML) 5 ML SYRINGE
INTRAMUSCULAR | Status: DC | PRN
  Administered 2024-02-20: 60 mg via INTRAVENOUS

## 2024-02-20 MED ORDER — LACTATED RINGERS IV SOLN: INTRAVENOUS | Status: DC

## 2024-02-20 MED ORDER — PROPOFOL 500 MG/50ML IV EMUL
INTRAVENOUS | Status: DC | PRN
  Administered 2024-02-20: 100 ug/kg/min via INTRAVENOUS
  Administered 2024-02-20: 80 mg via INTRAVENOUS

## 2024-02-20 NOTE — H&P (Signed)
 Amy Reyes is an 73 y.o. female.   Chief Complaint: history of colon polyps HPI: 73 year old female with past medical history of diabetes, GERD, hyperlipidemia, hypertension and arthritis, Who comes to the hospital for colonoscopy for surveillance of colonic polyps and family history of colon cancer.   Patient had history of colon polyps, she underwent proximal colectomy by Dr. Sheldon on September 2022.  Patient was found to have a tubular adenoma measuring 1.2 cm in the proximal colon extending to the ileum and a benign serrated polyp in the appendix. The patient denies having any nausea, vomiting, fever, chills, hematochezia, melena, hematemesis, abdominal distention, abdominal pain, diarrhea, jaundice, pruritus or weight loss.  Father was diagnosed with colon cancer in his 65s.  Past Medical History:  Diagnosis Date   Allergy    seasonal allergies   Anemia    Arthritis    oa   Bronchitis hx of   Chronic kidney disease    ckd stage 2- 3 due to NSAIDS use no nephrologist   Colon polyps    Complication of anesthesia    slow to wake up one time, none recent   Diabetes mellitus without complication (HCC)    borderline type 2 on metformin   History of carpal tunnel syndrome    years ago   History of kidney stones    History of rotator cuff tear    Bilateral   Hypercholesteremia    on meds   Hypertension    on meds   Macular degeneration    Peptic ulcer    PMB (postmenopausal bleeding)    last time middle of april 2021    Past Surgical History:  Procedure Laterality Date   ABDOMINOPLASTY  30 yrs ago   tummy tuck   BREAST SURGERY     reduction   CHOLECYSTECTOMY  2000   laparoscopic   COLONOSCOPY  2019   TA   COLONOSCOPY WITH PROPOFOL  N/A 12/13/2020   Procedure: COLONOSCOPY WITH PROPOFOL ;  Surgeon: Eartha Angelia Sieving, MD;  Location: [facility];  Service: Gastroenterology;  Laterality: N/A;  11:00   DILATATION & CURETTAGE/HYSTEROSCOPY WITH MYOSURE N/A  12/20/2019   Procedure: DILATATION & CURETTAGE/HYSTEROSCOPY WITH MYOSURE RESECTION OF POLYP;  Surgeon: Cleotilde Ronal RAMAN, MD;  Location: [facility] [place];  Service: Gynecology;  Laterality: N/A;  resection of polyp,  to follow first case   INJECTION KNEE Left 10/14/2016   Procedure: CORTISONE LEFT KNEE INJECTION;  Surgeon: Dempsey Moan, MD;  Location: [facility];  Service: Orthopedics;  Laterality: Left;   LAPAROSCOPIC GASTRIC BANDING  2010 or 2011   left knee injection  6 weeks ago   POLYPECTOMY     TA   POLYPECTOMY  12/13/2020   Procedure: POLYPECTOMY;  Surgeon: Eartha Angelia Sieving, MD;  Location: [facility];  Service: Gastroenterology;;  ascending colon   REDUCTION MAMMAPLASTY     ROTATOR CUFF REPAIR Left 05/2019   TOTAL KNEE ARTHROPLASTY Right 10/14/2016   Procedure: RIGHT TOTAL KNEE ARTHROPLASTY;  Surgeon: Dempsey Moan, MD;  Location: [facility];  Service: Orthopedics;  Laterality: Right;  Adductor Block   TOTAL KNEE ARTHROPLASTY Left 04/24/2020   Procedure: TOTAL KNEE ARTHROPLASTY;  Surgeon: Moan Dempsey, MD;  Location: [facility];  Service: Orthopedics;  Laterality: Left;     Family History  Problem Relation Age of Onset   Hypertension Mother    Thyroid  disease Mother    Kidney disease Sister    Hypertension Sister    Diabetes Maternal Grandfather  Diabetes Maternal Aunt    Diabetes Maternal Uncle    Prostate cancer Father 39   Colon cancer Father 61   Colon polyps Father 34   Hypertension Brother    Uterine cancer Maternal Aunt    Rectal cancer Neg Hx    Stomach cancer Neg Hx    Social History:  reports that she quit smoking about 40 years ago. Her smoking use included cigarettes. She started smoking about 41 years ago. She has never used smokeless tobacco. She reports that she does not currently use alcohol  after a past usage of about 2.0 standard drinks of alcohol  per week. She reports that she does not use drugs.  Allergies:  Allergies  Allergen  Reactions   Tramadol  Itching   Codeine     Does not like the side effects-post taking    Medications Prior to Admission  Medication Sig Dispense Refill   Ascorbic Acid  (VITA-C PO) Take by mouth.     atorvastatin  (LIPITOR) 80 MG tablet Take 80 mg by mouth daily.     losartan -hydrochlorothiazide  (HYZAAR) 100-25 MG tablet Take 1 tablet by mouth in the morning.     VITAMIN D , CHOLECALCIFEROL , PO Take by mouth.     Na Sulfate-K Sulfate-Mg Sulfate concentrate (SUPREP) 17.5-3.13-1.6 GM/177ML SOLN As directed 354 mL 0   OZEMPIC, 1 MG/DOSE, 4 MG/3ML SOPN Inject 2 mg into the skin once a week.     Sodium Sulfate-Mag Sulfate-KCl (SUTAB ) 1479-225-188 MG TABS Take 12 tablets by mouth as directed. 24 tablet 0    Results for orders placed or performed during the hospital encounter of 02/20/24 (from the past 48 hours)  Glucose, capillary     Status: None   Collection Time: 02/20/24 10:03 AM  Result Value Ref Range   Glucose-Capillary 88 70 - 99 mg/dL    Comment: Glucose reference range applies only to samples taken after fasting for at least 8 hours.   No results found.  Review of Systems  All other systems reviewed and are negative.   Blood pressure 125/64, pulse 62, temperature 97.9 F (36.6 C), temperature source Oral, resp. rate 12, height 5' 6 (1.676 m), weight 85.3 kg, SpO2 97%. Physical Exam  GENERAL: The patient is AO x3, in no acute distress. HEENT: Head is normocephalic and atraumatic. EOMI are intact. Mouth is well hydrated and without lesions. NECK: Supple. No masses LUNGS: Clear to auscultation. No presence of rhonchi/wheezing/rales. Adequate chest expansion HEART: RRR, normal s1 and s2. ABDOMEN: Soft, nontender, no guarding, no peritoneal signs, and nondistended. BS +. No masses. EXTREMITIES: Without any cyanosis, clubbing, rash, lesions or edema. NEUROLOGIC: AOx3, no focal motor deficit. SKIN: no jaundice, no rashes  Assessment/Plan 73 year old female with past medical  history of diabetes, GERD, hyperlipidemia, hypertension and arthritis, Who comes to the hospital for colonoscopy for surveillance of colonic polyps and family history of colon cancer.  Will proceed with colonoscopy.  Toribio Eartha Flavors, MD 02/20/2024, 10:46 AM

## 2024-02-20 NOTE — Op Note (Signed)
 [facility] Patient Name: Amy Reyes Procedure Date: 02/20/2024 10:33 AM MRN: [id number] Date of Birth: 1951-04-23 Attending MD: Toribio Fortune , , [id number] CSN: [id number] Age: 73 Admit Type: Outpatient Procedure:                Colonoscopy Indications:              Surveillance: Personal history of adenomatous                            polyps on last colonoscopy 3 years ago, High risk                            colon cancer surveillance: Personal history of                            sessile serrated colon polyp (less than 10 mm in                            size) with no dysplasia Providers:                Toribio Fortune, Crystal Page, [place] Wilson,                            Technician Referring MD:              Medicines:                Monitored Anesthesia Care Complications:            No immediate complications. Estimated Blood Loss:     Estimated blood loss: none. Procedure:                Pre-Anesthesia Assessment:                           - Prior to the procedure, a History and Physical                            was performed, and patient medications, allergies                            and sensitivities were reviewed. The patient's                            tolerance of previous anesthesia was reviewed.                           - The risks and benefits of the procedure and the                            sedation options and risks were discussed with the                            patient. All questions were answered and informed                            consent was  obtained.                           - ASA Grade Assessment: II - A patient with mild                            systemic disease.                           After obtaining informed consent, the colonoscope                            was passed under direct vision. Throughout the                            procedure, the patient's blood pressure, pulse, and                             oxygen  saturations were monitored continuously. The                            [id number]-[id number] ([id number]) scope was introduced through                            the anus and advanced to the the ileocolonic                            anastomosis. The colonoscopy was performed without                            difficulty. The patient tolerated the procedure                            well. The quality of the bowel preparation was                            excellent. Scope In: 10:55:00 AM Scope Out: 11:08:28 AM Scope Withdrawal Time: 0 hours 10 minutes 57 seconds  Total Procedure Duration: 0 hours 13 minutes 28 seconds  Findings:      Hemorrhoids were found on perianal exam.      The digital rectal exam findings include slight rectal prolapse.      The neo-terminal ileum appeared normal.      There was evidence of a prior end-to-side ileo-colonic anastomosis in       the ascending colon. This was patent and was characterized by healthy       appearing mucosa. The anastomosis was traversed.      Non-bleeding internal hemorrhoids were found during retroflexion and       during perianal exam. The hemorrhoids were large. Impression:               - Hemorrhoids found on perianal exam.                           - Slight rectal prolapse found on digital rectal  exam.                           - The examined portion of the ileum was normal.                           - Patent end-to-side ileo-colonic anastomosis,                            characterized by healthy appearing mucosa.                           - Non-bleeding internal hemorrhoids.                           - No specimens collected. Moderate Sedation:      Per Anesthesia Care Recommendation:           - Discharge patient to home (ambulatory).                           - Resume previous diet.                           - Repeat colonoscopy in 5 years for screening                            purposes.                            - If interested in removal of skin tag, will refer                            to general surgery.                           - If presenting recurrent rectal bleeding or anal                            pain, will proceed with hemorrhoidal banding Procedure Code(s):        --- Professional ---                           G0105, Colorectal cancer screening; colonoscopy on                            individual at high risk Diagnosis Code(s):        --- Professional ---                           K64.8, Other hemorrhoids                           Z98.0, Intestinal bypass and anastomosis status                           Z86.010, Personal history of colonic polyps CPT copyright 2022 American Medical Association. All rights  reserved. The codes documented in this report are preliminary and upon coder review may  be revised to meet current compliance requirements. Toribio Fortune, MD Toribio Fortune,  02/20/2024 11:18:14 AM This report has been signed electronically. Number of Addenda: 0

## 2024-02-20 NOTE — Transfer of Care (Signed)
 Immediate Anesthesia Transfer of Care Note  Patient: Amy Reyes  Procedure(s) Performed: COLONOSCOPY  Patient Location: PACU  Anesthesia Type:General  Level of Consciousness: awake, alert , and patient cooperative  Airway & Oxygen  Therapy: Patient Spontanous Breathing  Post-op Assessment: Report given to RN, Post -op Vital signs reviewed and stable, and Patient moving all extremities X 4  Post vital signs: Reviewed and stable  Last Vitals:  Vitals Value Taken Time  BP 103/36 02/20/24 11:12  Temp 36.6 C 02/20/24 11:12  Pulse 72 02/20/24 11:12  Resp 18 02/20/24 11:12  SpO2 95 % 02/20/24 11:12    Last Pain:  Vitals:   02/20/24 1112  TempSrc: Oral  PainSc: 0-No pain      Patients Stated Pain Goal: 10 (02/20/24 1020)  Complications: No notable events documented.

## 2024-02-20 NOTE — Discharge Instructions (Signed)
 You are being discharged to home.  Resume your previous diet.  Your physician has recommended a repeat colonoscopy in five years for screening purposes.  If interested in removal of skin tag, will refer to general surgery. If presenting recurrent rectal bleeding or anal pain, will proceed with hemorrhoidal banding

## 2024-02-20 NOTE — Anesthesia Preprocedure Evaluation (Addendum)
 Anesthesia Evaluation  Patient identified by MRN, date of birth, ID band Patient awake  General Assessment Comment:NO RECENT ANESTHESIA COMPLICATIONS DIFFICULT TO WAKE UP WITH TUMMY TUCK 35 YEARS AGO  Reviewed: Allergy & Precautions, H&P , NPO status , Patient's Chart, lab work & pertinent test results  Airway Mallampati: II  TM Distance: >3 FB Neck ROM: Full    Dental no notable dental hx.    Pulmonary former smoker   Pulmonary exam normal breath sounds clear to auscultation       Cardiovascular hypertension, Normal cardiovascular exam Rhythm:Regular Rate:Normal     Neuro/Psych negative neurological ROS  negative psych ROS   GI/Hepatic Neg liver ROS, PUD,,,  Endo/Other  diabetes, Type 2, Oral Hypoglycemic Agents  Class 3 obesity  Renal/GU Renal disease  negative genitourinary   Musculoskeletal  (+) Arthritis ,    Abdominal   Peds negative pediatric ROS (+)  Hematology  (+) Blood dyscrasia, anemia   Anesthesia Other Findings   Reproductive/Obstetrics negative OB ROS                              Anesthesia Physical Anesthesia Plan  ASA: 2  Anesthesia Plan: General   Post-op Pain Management:    Induction: Intravenous  PONV Risk Score and Plan:   Airway Management Planned: Nasal Cannula  Additional Equipment:   Intra-op Plan:   Post-operative Plan:   Informed Consent: I have reviewed the patients History and Physical, chart, labs and discussed the procedure including the risks, benefits and alternatives for the proposed anesthesia with the patient or authorized representative who has indicated his/her understanding and acceptance.     Dental advisory given  Plan Discussed with: CRNA  Anesthesia Plan Comments:          Anesthesia Quick Evaluation

## 2024-02-20 NOTE — Anesthesia Postprocedure Evaluation (Signed)
 Anesthesia Post Note  Patient: Amy Reyes  Procedure(s) Performed: COLONOSCOPY  Patient location during evaluation: PACU Anesthesia Type: General Level of consciousness: awake and alert Pain management: pain level controlled Vital Signs Assessment: post-procedure vital signs reviewed and stable Respiratory status: spontaneous breathing, nonlabored ventilation, respiratory function stable and patient connected to nasal cannula oxygen  Cardiovascular status: blood pressure returned to baseline and stable Postop Assessment: no apparent nausea or vomiting Anesthetic complications: no   No notable events documented.   Last Vitals:  Vitals:   02/20/24 1112 02/20/24 1115  BP: (!) 103/36 (!) 114/58  Pulse: 72 67  Resp: 18 17  Temp: 36.6 C   SpO2: 95% 95%    Last Pain:  Vitals:   02/20/24 1115  TempSrc:   PainSc: 0-No pain                 Andrea Limes

## 2024-02-24 ENCOUNTER — Encounter (HOSPITAL_COMMUNITY): Payer: Self-pay | Admitting: Gastroenterology

## 2024-03-09 ENCOUNTER — Encounter (HOSPITAL_COMMUNITY): Payer: Self-pay | Admitting: Obstetrics & Gynecology

## 2024-03-09 NOTE — Progress Notes (Signed)
 Spoke w/ via phone for pre-op interview--- pt Lab needs dos----       cbc/ bmp Lab results------ current EKG in epic/ chart;  current A1c w/ chart from PCP dated 01-14-2024 printed care everywhere COVID test -----patient states asymptomatic no test needed Arrive at ------- 0700  NPO after MN w/ exception sips of water  w/ meds Pre-Surgery Ensure or G2:  n/a  Med rec completed Medications to take morning of surgery ----- lipitor Diabetic medication ----- n/a  GLP1 agonist last dose:  03-08-2024 GLP1 instructions:  pt stated was aware to stop 7 days prior to surgery  Patient instructed no nail polish to be worn day of surgery Patient instructed to bring photo id and insurance card day of surgery Patient aware to have Driver (ride ) / caregiver    for 24 hours after surgery - daughter, Amy Reyes Patient Special Instructions ----- antibacterial soap shower morning of surgery Pre-Op special Instructions -----  n/a  Patient verbalized understanding of instructions that were given at this phone interview. Patient denies chest pain, sob, fever, cough at the interview.

## 2024-03-10 ENCOUNTER — Encounter (HOSPITAL_BASED_OUTPATIENT_CLINIC_OR_DEPARTMENT_OTHER): Payer: Self-pay | Admitting: Obstetrics & Gynecology

## 2024-03-15 NOTE — Anesthesia Preprocedure Evaluation (Signed)
 Anesthesia Evaluation  Patient identified by MRN, date of birth, ID band Patient awake  General Assessment Comment:NO RECENT ANESTHESIA COMPLICATIONS DIFFICULT TO WAKE UP WITH TUMMY TUCK 35 YEARS AGO  Reviewed: Allergy & Precautions, H&P , NPO status , Patient's Chart, lab work & pertinent test results  Airway Mallampati: II  TM Distance: >3 FB Neck ROM: Full    Dental  (+) Partial Upper, Edentulous Lower, Dental Advisory Given   Pulmonary neg recent URI, former smoker   Pulmonary exam normal breath sounds clear to auscultation       Cardiovascular hypertension, Pt. on medications Normal cardiovascular exam Rhythm:Regular Rate:Normal     Neuro/Psych negative neurological ROS  negative psych ROS   GI/Hepatic Neg liver ROS, PUD,,,  Endo/Other  diabetes, Type 2, Oral Hypoglycemic Agents  Class 3 obesity  Renal/GU Renal disease     Musculoskeletal  (+) Arthritis ,    Abdominal  (+) + obese  Peds  Hematology  (+) Blood dyscrasia, anemia   Anesthesia Other Findings   Reproductive/Obstetrics                              Anesthesia Physical Anesthesia Plan  ASA: 2  Anesthesia Plan: General   Post-op Pain Management: Tylenol  PO (pre-op)*   Induction: Intravenous  PONV Risk Score and Plan: 4 or greater and Ondansetron , Dexamethasone  and Treatment may vary due to age or medical condition  Airway Management Planned: LMA  Additional Equipment:   Intra-op Plan:   Post-operative Plan: Extubation in OR  Informed Consent: I have reviewed the patients History and Physical, chart, labs and discussed the procedure including the risks, benefits and alternatives for the proposed anesthesia with the patient or authorized representative who has indicated his/her understanding and acceptance.     Dental advisory given  Plan Discussed with: CRNA  Anesthesia Plan Comments:           Anesthesia Quick Evaluation

## 2024-03-16 ENCOUNTER — Ambulatory Visit (HOSPITAL_COMMUNITY)
Admission: RE | Admit: 2024-03-16 | Discharge: 2024-03-16 | Disposition: A | Discharge status: Home / Self Care | Attending: Obstetrics & Gynecology | Admitting: Obstetrics & Gynecology

## 2024-03-16 ENCOUNTER — Encounter (HOSPITAL_COMMUNITY): Payer: Self-pay | Admitting: Obstetrics & Gynecology

## 2024-03-16 ENCOUNTER — Other Ambulatory Visit (HOSPITAL_COMMUNITY): Payer: Self-pay

## 2024-03-16 ENCOUNTER — Other Ambulatory Visit (HOSPITAL_BASED_OUTPATIENT_CLINIC_OR_DEPARTMENT_OTHER): Payer: Self-pay | Admitting: Obstetrics & Gynecology

## 2024-03-16 ENCOUNTER — Ambulatory Visit (HOSPITAL_COMMUNITY): Payer: Self-pay | Admitting: Anesthesiology

## 2024-03-16 ENCOUNTER — Ambulatory Visit (HOSPITAL_BASED_OUTPATIENT_CLINIC_OR_DEPARTMENT_OTHER): Payer: Self-pay | Admitting: Anesthesiology

## 2024-03-16 ENCOUNTER — Encounter (HOSPITAL_COMMUNITY)
Admission: RE | Disposition: A | Discharge status: Home / Self Care | Payer: Self-pay | Source: Home / Self Care | Attending: Obstetrics & Gynecology

## 2024-03-16 DIAGNOSIS — E1122 Type 2 diabetes mellitus with diabetic chronic kidney disease: Secondary | ICD-10-CM | POA: Diagnosis not present

## 2024-03-16 DIAGNOSIS — D250 Submucous leiomyoma of uterus: Secondary | ICD-10-CM | POA: Diagnosis not present

## 2024-03-16 DIAGNOSIS — Z87891 Personal history of nicotine dependence: Secondary | ICD-10-CM | POA: Insufficient documentation

## 2024-03-16 DIAGNOSIS — I129 Hypertensive chronic kidney disease with stage 1 through stage 4 chronic kidney disease, or unspecified chronic kidney disease: Secondary | ICD-10-CM | POA: Insufficient documentation

## 2024-03-16 DIAGNOSIS — E66813 Obesity, class 3: Secondary | ICD-10-CM | POA: Diagnosis not present

## 2024-03-16 DIAGNOSIS — N950 Postmenopausal bleeding: Secondary | ICD-10-CM | POA: Diagnosis not present

## 2024-03-16 DIAGNOSIS — Z6830 Body mass index (BMI) 30.0-30.9, adult: Secondary | ICD-10-CM | POA: Insufficient documentation

## 2024-03-16 DIAGNOSIS — N840 Polyp of corpus uteri: Secondary | ICD-10-CM | POA: Diagnosis not present

## 2024-03-16 DIAGNOSIS — N183 Chronic kidney disease, stage 3 unspecified: Secondary | ICD-10-CM | POA: Diagnosis not present

## 2024-03-16 DIAGNOSIS — D259 Leiomyoma of uterus, unspecified: Secondary | ICD-10-CM | POA: Diagnosis not present

## 2024-03-16 DIAGNOSIS — Z8711 Personal history of peptic ulcer disease: Secondary | ICD-10-CM | POA: Diagnosis not present

## 2024-03-16 DIAGNOSIS — R9389 Abnormal findings on diagnostic imaging of other specified body structures: Secondary | ICD-10-CM | POA: Diagnosis not present

## 2024-03-16 DIAGNOSIS — Z01818 Encounter for other preprocedural examination: Secondary | ICD-10-CM

## 2024-03-16 HISTORY — DX: Personal history of adenomatous and serrated colon polyps: Z86.0101

## 2024-03-16 HISTORY — DX: Chronic kidney disease, stage 3 unspecified: N18.30

## 2024-03-16 HISTORY — DX: Hyperlipidemia, unspecified: E78.5

## 2024-03-16 HISTORY — DX: Restless legs syndrome: G25.81

## 2024-03-16 HISTORY — DX: Anemia in chronic kidney disease: D63.1

## 2024-03-16 HISTORY — PX: HYSTEROSCOPY WITH D & C: SHX1775

## 2024-03-16 HISTORY — DX: Presence of spectacles and contact lenses: Z97.3

## 2024-03-16 HISTORY — DX: Chronic kidney disease, unspecified: N18.9

## 2024-03-16 HISTORY — DX: Polyneuropathy, unspecified: G62.9

## 2024-03-16 HISTORY — DX: Type 2 diabetes mellitus without complications: E11.9

## 2024-03-16 HISTORY — DX: Leiomyoma of uterus, unspecified: D25.9

## 2024-03-16 HISTORY — DX: Unspecified macular degeneration: H35.30

## 2024-03-16 HISTORY — DX: Abnormal findings on diagnostic imaging of other abdominal regions, including retroperitoneum: R93.5

## 2024-03-16 HISTORY — DX: Trochanteric bursitis, right hip: M70.61

## 2024-03-16 HISTORY — DX: Other seasonal allergic rhinitis: J30.2

## 2024-03-16 HISTORY — DX: Personal history of peptic ulcer disease: Z87.11

## 2024-03-16 HISTORY — DX: Unspecified osteoarthritis, unspecified site: M19.90

## 2024-03-16 LAB — BASIC METABOLIC PANEL WITH GFR
Anion gap: 17 — ABNORMAL HIGH (ref 5–15)
BUN: 15 mg/dL (ref 8–23)
CO2: 19 mmol/L — ABNORMAL LOW (ref 22–32)
Calcium: 9.2 mg/dL (ref 8.9–10.3)
Chloride: 105 mmol/L (ref 98–111)
Creatinine, Ser: 1.33 mg/dL — ABNORMAL HIGH (ref 0.44–1.00)
GFR, Estimated: 42 mL/min — ABNORMAL LOW (ref >60)
Glucose, Bld: 87 mg/dL (ref 70–99)
Potassium: 4.3 mmol/L (ref 3.5–5.1)
Sodium: 141 mmol/L (ref 135–145)

## 2024-03-16 LAB — CBC
HCT: 35 % — ABNORMAL LOW (ref 36.0–46.0)
Hemoglobin: 11.1 g/dL — ABNORMAL LOW (ref 12.0–15.0)
MCH: 28.2 pg (ref 26.0–34.0)
MCHC: 31.7 g/dL (ref 30.0–36.0)
MCV: 88.8 fL (ref 80.0–100.0)
Platelets: 202 K/uL (ref 150–400)
RBC: 3.94 MIL/uL (ref 3.87–5.11)
RDW: 14.9 % (ref 11.5–15.5)
WBC: 7.1 K/uL (ref 4.0–10.5)
nRBC: 0 % (ref 0.0–0.2)

## 2024-03-16 LAB — GLUCOSE, CAPILLARY
Glucose-Capillary: 83 mg/dL (ref 70–99)
Glucose-Capillary: 103 mg/dL — ABNORMAL HIGH (ref 70–99)

## 2024-03-16 SURGERY — DILATATION AND CURETTAGE /HYSTEROSCOPY
Anesthesia: General | Site: Vagina

## 2024-03-16 MED ORDER — EPHEDRINE SULFATE-NACL 50-0.9 MG/10ML-% IV SOSY
PREFILLED_SYRINGE | INTRAVENOUS | Status: DC | PRN
  Administered 2024-03-16 (×2): 10 mg via INTRAVENOUS

## 2024-03-16 MED ORDER — PROPOFOL 10 MG/ML IV BOLUS
INTRAVENOUS | Status: DC | PRN
Start: 2024-03-16 — End: 2024-03-16
  Administered 2024-03-16: 140 mg via INTRAVENOUS

## 2024-03-16 MED ORDER — POVIDONE-IODINE 10 % EX SWAB: 2.0000 | Freq: Once | CUTANEOUS | Status: DC

## 2024-03-16 MED ORDER — ATROPINE SULFATE 0.4 MG/ML IV SOLN
INTRAVENOUS | Status: DC | PRN
  Administered 2024-03-16: .4 mg via INTRAVENOUS

## 2024-03-16 MED ORDER — INSULIN ASPART 100 UNIT/ML IJ SOLN: 0.0000 [IU] | INTRAMUSCULAR | Status: DC | PRN

## 2024-03-16 MED ORDER — ORAL CARE MOUTH RINSE: 15.0000 mL | Freq: Once | OROMUCOSAL | Status: AC

## 2024-03-16 MED ORDER — ONDANSETRON HCL 4 MG/2ML IJ SOLN
INTRAMUSCULAR | Status: DC | PRN
Start: 2024-03-16 — End: 2024-03-16
  Administered 2024-03-16: 4 mg via INTRAVENOUS

## 2024-03-16 MED ORDER — CEFAZOLIN SODIUM-DEXTROSE 2-3 GM-%(50ML) IV SOLR
INTRAVENOUS | Status: DC | PRN
  Administered 2024-03-16: 2 g via INTRAVENOUS

## 2024-03-16 MED ORDER — ONDANSETRON 4 MG PO TBDP: 4.0000 mg | ORAL_TABLET | Freq: Three times a day (TID) | ORAL | 0 refills | Status: AC | PRN

## 2024-03-16 MED ORDER — CEFAZOLIN SODIUM 1 G IJ SOLR
INTRAMUSCULAR | Status: AC
  Filled 2024-03-16: qty 20

## 2024-03-16 MED ORDER — PROPOFOL 10 MG/ML IV BOLUS
INTRAVENOUS | Status: AC
  Filled 2024-03-16: qty 20

## 2024-03-16 MED ORDER — LIDOCAINE-EPINEPHRINE 1 %-1:100000 IJ SOLN
INTRAMUSCULAR | Status: DC | PRN
  Administered 2024-03-16: 10 mL

## 2024-03-16 MED ORDER — LIDOCAINE 2% (20 MG/ML) 5 ML SYRINGE
INTRAMUSCULAR | Status: DC | PRN
Start: 2024-03-16 — End: 2024-03-16
  Administered 2024-03-16: 100 mg via INTRAVENOUS

## 2024-03-16 MED ORDER — ACETAMINOPHEN 500 MG PO TABS
1000.0000 mg | ORAL_TABLET | ORAL | Status: AC
  Administered 2024-03-16: 1000 mg via ORAL

## 2024-03-16 MED ORDER — LIDOCAINE 2% (20 MG/ML) 5 ML SYRINGE
INTRAMUSCULAR | Status: AC
  Filled 2024-03-16: qty 5

## 2024-03-16 MED ORDER — DEXAMETHASONE SODIUM PHOSPHATE 10 MG/ML IJ SOLN
INTRAMUSCULAR | Status: AC
  Filled 2024-03-16: qty 1

## 2024-03-16 MED ORDER — SODIUM CHLORIDE 0.9 % IV SOLN: INTRAVENOUS | Status: DC

## 2024-03-16 MED ORDER — CHLORHEXIDINE GLUCONATE 0.12 % MT SOLN
15.0000 mL | Freq: Once | OROMUCOSAL | Status: AC
  Administered 2024-03-16: 15 mL via OROMUCOSAL

## 2024-03-16 MED ORDER — FENTANYL CITRATE (PF) 250 MCG/5ML IJ SOLN
INTRAMUSCULAR | Status: DC | PRN
  Administered 2024-03-16: 50 ug via INTRAVENOUS

## 2024-03-16 MED ORDER — FENTANYL CITRATE (PF) 250 MCG/5ML IJ SOLN
INTRAMUSCULAR | Status: AC
  Filled 2024-03-16: qty 5

## 2024-03-16 MED ORDER — SODIUM CHLORIDE 0.9 % IR SOLN
Status: DC | PRN
  Administered 2024-03-16: 3000 mL

## 2024-03-16 MED ORDER — ACETAMINOPHEN 500 MG PO TABS
ORAL_TABLET | ORAL | Status: AC
  Filled 2024-03-16: qty 2

## 2024-03-16 MED ORDER — ONDANSETRON HCL 4 MG/2ML IJ SOLN
INTRAMUSCULAR | Status: AC
  Filled 2024-03-16: qty 2

## 2024-03-16 MED ORDER — METRONIDAZOLE 500 MG PO TABS
500.0000 mg | ORAL_TABLET | Freq: Two times a day (BID) | ORAL | 0 refills | Status: DC
  Filled 2024-03-16: qty 14, 7d supply, fill #0

## 2024-03-16 MED ORDER — OXYCODONE HCL 5 MG PO TABS
5.0000 mg | ORAL_TABLET | Freq: Four times a day (QID) | ORAL | 0 refills | Status: DC | PRN
  Filled 2024-03-16: qty 12, 3d supply, fill #0

## 2024-03-16 MED ORDER — CHLORHEXIDINE GLUCONATE 0.12 % MT SOLN
OROMUCOSAL | Status: AC
  Filled 2024-03-16: qty 15

## 2024-03-16 MED ORDER — DEXAMETHASONE SODIUM PHOSPHATE 10 MG/ML IJ SOLN
INTRAMUSCULAR | Status: DC | PRN
  Administered 2024-03-16: 4 mg via INTRAVENOUS

## 2024-03-16 SURGICAL SUPPLY — 16 items
CANISTER SUCTION 3000ML PPV (SUCTIONS) ×1 IMPLANT
CATH ROBINSON RED A/P 16FR (CATHETERS) ×1 IMPLANT
COVER MAYO STAND STRL (DRAPES) ×1 IMPLANT
DEVICE MYOSURE LITE (MISCELLANEOUS) IMPLANT
DEVICE MYOSURE REACH (MISCELLANEOUS) IMPLANT
DILATOR CANAL MILEX (MISCELLANEOUS) IMPLANT
GLOVE ECLIPSE 6.5 STRL STRAW (GLOVE) ×1 IMPLANT
GLOVE SURG UNDER POLY LF SZ7 (GLOVE) ×2 IMPLANT
GOWN STRL REUS W/ TWL LRG LVL3 (GOWN DISPOSABLE) ×2 IMPLANT
KIT PROCEDURE FLUENT (KITS) ×1 IMPLANT
KIT TURNOVER KIT B (KITS) ×1 IMPLANT
PACK VAGINAL MINOR WOMEN LF (CUSTOM PROCEDURE TRAY) ×1 IMPLANT
PAD OB MATERNITY 11 LF (PERSONAL CARE ITEMS) ×1 IMPLANT
SEAL ROD LENS SCOPE MYOSURE (ABLATOR) ×1 IMPLANT
TOWEL GREEN STERILE FF (TOWEL DISPOSABLE) ×2 IMPLANT
UNDERPAD 30X36 HEAVY ABSORB (UNDERPADS AND DIAPERS) ×1 IMPLANT

## 2024-03-16 NOTE — Op Note (Addendum)
 03/16/2024  9:27 AM  PATIENT:  Amy Reyes  73 y.o. female  PRE-OPERATIVE DIAGNOSIS:  Postmenopausal bleeding Thickened endometrium Polyp Fibroid uterus  POST-OPERATIVE DIAGNOSIS:  Postmenopausal bleeding Thickened endometrium Polyps Fibroid uterus  PROCEDURE:  Procedure(s): DILATATION AND CURETTAGE /HYSTEROSCOPY AND POLYPECTOMY WITH MYOSURE  SURGEON:  [name]  ASSISTANTS: OR staff.   ANESTHESIA:   general, Dr. Darlyn MILES BLOOD LOSS: 5 mL  BLOOD ADMINISTERED:none   FLUIDS: 700cc LR  UOP: voided before going into the OR  SPECIMEN:  endometrial polyps and curettings  DISPOSITION OF SPECIMEN:  PATHOLOGY  FINDINGS: submucosal fibroid that has been present previously, four endometrial polyps  DESCRIPTION OF OPERATION: Patient was taken to the operating room.  She is placed in the supine position. SCDs were on her lower extremities and functioning properly. General anesthesia with an LMA was administered without difficulty. Dr. Darlyn, anesthesia, oversaw case.  Legs were then placed in the [facility] stirrups in the low lithotomy position. The legs were lifted to the high lithotomy position and the Betadine  prep was used on the inner thighs perineum and vagina x3. Patient was draped in a normal standard fashion. The anterior lip of the cervix was grasped with single-tooth tenaculum.  A paracervical block of 1% lidocaine  mixed one-to-one with epinephrine  (1:100,000 units).  10 cc was used total. The cervix is dilated up to #[facility] dilators. The endometrial cavity sounded to 7 cm.   A 5.5 millimeter diagnostic hysteroscope was obtained. Normal saline was used as a hysteroscopic fluid. The hysteroscope was advanced through the endocervical canal into the endometrial cavity. The tubal ostia were noted bilaterally. Additional findings included multiple polyps and submucosal fibroids.  Using the [facility] reach device, the polyps were completely resected.  The  hysteroscope was removed. A #1 toothed curette was used to curette the cavity until rough gritty texture was noted in all quadrants. With revisualization of the hysteroscope, there was no longer any abnormal findings.  At this point no other procedure was needed and this procedure was ended. The hysteroscope was removed. The fluid deficit was 75 cc. The tenaculum was removed from the anterior lip of the cervix. The speculum was removed from the vagina. The prep was cleansed of the patient's skin. The legs are positioned back in the supine position. Sponge, lap, needle, instrument counts were correct x2. Patient was taken to recovery in stable condition.  COUNTS:  YES  PLAN OF CARE: Transfer to PACU

## 2024-03-16 NOTE — Transfer of Care (Signed)
 Immediate Anesthesia Transfer of Care Note  Patient: Amy Reyes  Procedure(s) Performed: DILATATION AND CURETTAGE /HYSTEROSCOPY AND POLYPECTOMY WITH MYOSURE (Vagina )  Patient Location: PACU  Anesthesia Type:General  Level of Consciousness: awake, alert , oriented, and patient cooperative  Airway & Oxygen  Therapy: Patient Spontanous Breathing  Post-op Assessment: Report given to RN, Post -op Vital signs reviewed and stable, Patient moving all extremities X 4, and Patient able to stick tongue midline  Post vital signs: Reviewed and stable  Last Vitals:  Vitals Value Taken Time  BP 139/57 03/16/24 09:18  Temp 97.6   Pulse 106 03/16/24 09:18  Resp 17 03/16/24 09:18  SpO2 96 % 03/16/24 09:18  Vitals shown include unfiled device data.  Last Pain:  Vitals:   03/16/24 0715  TempSrc: Oral  PainSc: 0-No pain      Patients Stated Pain Goal: 5 (03/16/24 0715)  Complications: No notable events documented.

## 2024-03-16 NOTE — H&P (Addendum)
 Amy Reyes is an 73 y.o. female G28P2 WAA female with hx of PMP bleeding, h/o endometrial polyps and uterine fibroids here for additional evaluation with hysteroscopy, possible polyp removal, and D&C.  Pt did have negative endometrial biopsy on 12/01/2023.  Uterus on ultrasound measred 12 x 8 x 9cm with multiple fibroids.  Endometrium was 7.69mm.  Pt initially wanted to monitor but has recurrent bleeding and decided to proceed with procedure.  Risks and benefits have been reviewed.  These are reviewed today Procedure discussed with patient.  Bleeding, rare risk of transfusion, infection, 1% risk of uterine perforation with risks of fluid deficit causing cardiac arrythmia, cerebral swelling and/or need to stop procedure early.  Fluid emboli and rare risk of death discussed.  DVT/PE, rare risk of risk of bowel/bladder/ureteral/vascular injury all discussed  Patient aware if pathology abnormal she may need additional treatment.  She is complaining of vaginal odor so will give a single dose of IV antibiotics and send her home with flagyl  as well.  All questions answered.     Pertinent Gynecological History: Menses: post-menopausal Bleeding: post menopausal bleeding Contraception: post menopausal status DES exposure: denies Blood transfusions: none Sexually transmitted diseases: no past history Previous GYN Procedures: hysteroscopy with polyp resection 12/2019 Last mammogram: normal Date: 03/28/2021 Last pap: normal Date: 11/05/2019 OB History: G3, P2   Menstrual History: No LMP recorded. Patient is postmenopausal.    Past Medical History:  Diagnosis Date   Abnormal endometrial ultrasound    w/  hx endometrial polyp   Anemia associated with chronic renal failure    CKD (chronic kidney disease), stage III (HCC)    Complication of anesthesia    slow to wake up one time   Greater trochanteric bursitis of both hips    History of adenomatous polyp of colon    History of carpal tunnel syndrome     years ago   History of kidney stones    History of peptic ulcer    Hyperlipidemia    Hypertension    Macular degeneration of both eyes    OA (osteoarthritis)    Peripheral polyneuropathy    PMB (postmenopausal bleeding)    RLS (restless legs syndrome)    Seasonal allergic rhinitis    Type 2 diabetes mellitus (HCC)    followed by pcp   (03-09-2024   Uterine fibroid    Wears glasses     Past Surgical History:  Procedure Laterality Date   CHOLECYSTECTOMY, LAPAROSCOPIC  2000   laparoscopic   COLONOSCOPY N/A 02/20/2024   Procedure: COLONOSCOPY;  Surgeon: Amy Angelia Sieving, MD;  Location: [facility];  Service: Gastroenterology;  Laterality: N/A;  11:15 am, asa 1-2   COLONOSCOPY WITH PROPOFOL  N/A 12/13/2020   Procedure: COLONOSCOPY WITH PROPOFOL ;  Surgeon: Amy Angelia Sieving, MD;  Location: [facility];  Service: Gastroenterology;  Laterality: N/A;  11:00   COMBINED AUGMENTATION MAMMAPLASTY AND ABDOMINOPLASTY     1990s   DILATATION & CURETTAGE/HYSTEROSCOPY WITH MYOSURE N/A 12/20/2019   Procedure: DILATATION & CURETTAGE/HYSTEROSCOPY WITH MYOSURE RESECTION OF POLYP;  Surgeon: Amy Amy RAMAN, MD;  Location: [facility];  Service: Gynecology;  Laterality: N/A;  resection of polyp,  to follow first case   INJECTION KNEE Left 10/14/2016   Procedure: CORTISONE LEFT KNEE INJECTION;  Surgeon: Amy Moan, MD;  Location: [facility];  Service: Orthopedics;  Laterality: Left;   LAPAROSCOPIC GASTRIC BANDING  07/2012   in [place], [place]   POLYPECTOMY  12/13/2020   Procedure: POLYPECTOMY;  Surgeon: Amy Flavors, Toribio, MD;  Location: [facility];  Service: Gastroenterology;;  ascending colon   ROTATOR CUFF REPAIR Left 05/2019   TOTAL KNEE ARTHROPLASTY Right 10/14/2016   Procedure: RIGHT TOTAL KNEE ARTHROPLASTY;  Surgeon: Amy Moan, MD;  Location: [facility];  Service: Orthopedics;  Laterality: Right;  Adductor Block   TOTAL KNEE ARTHROPLASTY Left 04/24/2020    Procedure: TOTAL KNEE ARTHROPLASTY;  Surgeon: Reyes Dempsey, MD;  Location: [facility];  Service: Orthopedics;  Laterality: Left;    XI ROBOTIC LAPAROSCOPIC ASSISTED APPENDECTOMY  03/16/2021   @[facility]   by dr Amy Reyes;   PROXIMAL COLECTOMY/ LYSIS ADHESIONS/ REMOVAL ADJUSTABLE GASTRIC LAP BAND    Family History  Problem Relation Age of Onset   Hypertension Mother    Thyroid  disease Mother    Kidney disease Sister    Hypertension Sister    Diabetes Maternal Grandfather    Diabetes Maternal Aunt    Diabetes Maternal Uncle    Prostate cancer Father 88   Colon cancer Father 36   Colon polyps Father 82   Hypertension Brother    Uterine cancer Maternal Aunt    Rectal cancer Neg Hx    Stomach cancer Neg Hx     Social History:  reports that she has quit smoking. Her smoking use included cigarettes. She has never used smokeless tobacco. She reports that she does not currently use alcohol . She reports that she does not use drugs.  Allergies:  Allergies  Allergen Reactions   Tramadol  Itching   Codeine     Does not like the side effects-post taking    No medications prior to admission.    Review of Systems  Constitutional: Negative.   Respiratory: Negative.    Cardiovascular: Negative.   Genitourinary:        PMP bleeding    Height 5' 6 (1.676 m), weight 86.2 kg. Physical Exam Constitutional:      Appearance: Normal appearance.  Cardiovascular:     Rate and Rhythm: Normal rate and regular rhythm.  Pulmonary:     Effort: Pulmonary effort is normal.     Breath sounds: Normal breath sounds.  Neurological:     General: No focal deficit present.     Mental Status: She is alert and oriented to person, place, and time.  Psychiatric:        Mood and Affect: Mood normal.        Behavior: Behavior normal.     No results found for this or any previous visit (from the past 24 hours).  No results found.  Assessment/Plan: 73 yo G3P2 WAAF with recurrent PMP bleeding here for  hysteroscopy with possible polyp resection, D&C.  Questions answered.  Pt ready to proceed.  Amy Reyes 03/16/2024, 5:45 AM

## 2024-03-16 NOTE — Anesthesia Postprocedure Evaluation (Signed)
 Anesthesia Post Note  Patient: Amy Reyes  Procedure(s) Performed: DILATATION AND CURETTAGE /HYSTEROSCOPY AND POLYPECTOMY WITH MYOSURE (Vagina )     Patient location during evaluation: PACU Anesthesia Type: General Level of consciousness: sedated and patient cooperative Pain management: pain level controlled Vital Signs Assessment: post-procedure vital signs reviewed and stable Respiratory status: spontaneous breathing Cardiovascular status: stable Anesthetic complications: no   No notable events documented.  Last Vitals:  Vitals:   03/16/24 1030 03/16/24 1045  BP:    Pulse: 80 66  Resp: (!) 21 15  Temp:    SpO2: 98% 94%    Last Pain:  Vitals:   03/16/24 1102  TempSrc:   PainSc: 2                  Norleen Pope

## 2024-03-16 NOTE — Discharge Instructions (Addendum)
 Post-surgical Instructions, Outpatient Surgery  You may expect to feel dizzy, weak, and drowsy for as long as 24 hours after receiving the medicine that made you sleep (anesthetic). For the first 24 hours after your surgery:   Do not drive a car, ride a bicycle, participate in physical activities, or take public transportation until you are done taking narcotic pain medicines or as directed by Dr. Cleotilde.  Do not drink alcohol  or take tranquilizers.  Do not take medicine that has not been prescribed by your physicians.  Do not sign important papers or make important decisions while on narcotic pain medicines.  Have a responsible person with you.   PAIN MANAGEMENT Do not take ibuprofen as your kidney function is mildly elevated. Ocycodone 5mg  every six hours can be taken for pain.  You can also add 500mg  Tylenol , 2 tablets every 6 hours.  (Remember that narcotic pain medications increase your risk of constipation.  If this becomes a problem, you may take an over the counter stool softener like Colace 100mg  up to four times a day.)  DO'S AND DON'T'S Do not take a tub bath for one week.  You may shower on the first day after your surgery Do not do any heavy lifting for one to two weeks.  This increases the chance of bleeding. Do move around as you feel able.  Stairs are fine.  You may begin to exercise again as you feel able.  Do not lift any weights for two weeks. Do not put anything in the vagina for two weeks--no tampons, intercourse, or douching.   Please DO NOT restart your Ozympic for one more week.  REGULAR MEDIATIONS/VITAMINS: You may restart all of your regular medications as prescribed. You may restart all of your vitamins as you normally take them.    PLEASE CALL OR SEEK MEDICAL CARE IF: You have persistent nausea and vomiting.  You have trouble eating or drinking.  You have an oral temperature above 100.5.  You have constipation that is not helped by adjusting diet or increasing  fluid intake. Pain medicines are a common cause of constipation.  You have heavy vaginal bleeding     Post Anesthesia Home Care Instructions  Activity: Get plenty of rest for the remainder of the day. A responsible individual must stay with you for 24 hours following the procedure.  For the next 24 hours, DO NOT: -Drive a car -Advertising copywriter -Drink alcoholic beverages -Take any medication unless instructed by your physician -Make any legal decisions or sign important papers.  Meals: Start with liquid foods such as gelatin or soup. Progress to regular foods as tolerated. Avoid greasy, spicy, heavy foods. If nausea and/or vomiting occur, drink only clear liquids until the nausea and/or vomiting subsides. Call your physician if vomiting continues.  Special Instructions/Symptoms: Your throat may feel dry or sore from the anesthesia or the breathing tube placed in your throat during surgery. If this causes discomfort, gargle with warm salt water . The discomfort should disappear within 24 hours.  If you had a scopolamine patch placed behind your ear for the management of post- operative nausea and/or vomiting:  1. The medication in the patch is effective for 72 hours, after which it should be removed.  Wrap patch in a tissue and discard in the trash. Wash hands thoroughly with soap and water . 2. You may remove the patch earlier than 72 hours if you experience unpleasant side effects which may include dry mouth, dizziness or visual disturbances. 3. Avoid  touching the patch. Wash your hands with soap and water  after contact with the patch.

## 2024-03-16 NOTE — Anesthesia Procedure Notes (Signed)
 Procedure Name: LMA Insertion Date/Time: 03/16/2024 8:42 AM  Performed by: Viviana Almarie DASEN, CRNAPre-anesthesia Checklist: Patient identified, Emergency Drugs available, Suction available and Patient being monitored Patient Re-evaluated:Patient Re-evaluated prior to induction Oxygen  Delivery Method: Circle System Utilized Preoxygenation: Pre-oxygenation with 100% oxygen  Induction Type: IV induction Ventilation: Mask ventilation without difficulty LMA: LMA inserted LMA Size: 4.0 Tube type: Oral Number of attempts: 1 Airway Equipment and Method: Bite block Placement Confirmation: positive ETCO2 Tube secured with: Tape Dental Injury: Teeth and Oropharynx as per pre-operative assessment

## 2024-03-17 ENCOUNTER — Encounter (HOSPITAL_COMMUNITY): Payer: Self-pay | Admitting: Obstetrics & Gynecology

## 2024-03-18 LAB — SURGICAL PATHOLOGY

## 2024-03-23 ENCOUNTER — Ambulatory Visit (HOSPITAL_BASED_OUTPATIENT_CLINIC_OR_DEPARTMENT_OTHER): Payer: Self-pay | Admitting: Obstetrics & Gynecology

## 2024-04-15 ENCOUNTER — Ambulatory Visit (HOSPITAL_BASED_OUTPATIENT_CLINIC_OR_DEPARTMENT_OTHER): Payer: Self-pay | Admitting: Obstetrics & Gynecology

## 2024-04-15 VITALS — HR 56 | Wt 189.2 lb

## 2024-04-15 DIAGNOSIS — D251 Intramural leiomyoma of uterus: Secondary | ICD-10-CM | POA: Diagnosis not present

## 2024-04-15 DIAGNOSIS — M7071 Other bursitis of hip, right hip: Secondary | ICD-10-CM | POA: Diagnosis not present

## 2024-04-15 DIAGNOSIS — N840 Polyp of corpus uteri: Secondary | ICD-10-CM

## 2024-04-15 NOTE — Progress Notes (Signed)
 GYNECOLOGY  VISIT  CC:   post op recheck  HPI: 73 y.o. G3P0012 Widowed Black or [place] American female here for recheck after undergoing hysteroscopy with D&C.  Endometrial polyps were noted and were benign.  She reports bleeding is none. Patient reported that she did have some spotting but it resolved about three or four days ago. She has had some mild cramping.  The cramping resolved when the bleeding.  Bowel function is Normal.  Bladder function is normal.    Pathology reviewed:  Yes .  Questions answered.    MEDS:   Current Outpatient Medications on File Prior to Visit  Medication Sig Dispense Refill   acetaminophen  (TYLENOL ) 500 MG tablet Take 1,000 mg by mouth every 6 (six) hours as needed.     atorvastatin  (LIPITOR) 80 MG tablet Take 80 mg by mouth daily.     Cholecalciferol  (VITAMIN D3) 50 MCG (2000 UT) capsule Take 2,000 Units by mouth daily.     losartan -hydrochlorothiazide  (HYZAAR) 100-25 MG tablet Take 1 tablet by mouth in the morning.     Multiple Vitamins-Minerals (PRESERVISION AREDS 2) CAPS Take 1-2 capsules by mouth 2 (two) times daily.     ondansetron  (ZOFRAN -ODT) 4 MG disintegrating tablet Take 1 tablet (4 mg total) by mouth every 8 (eight) hours as needed for nausea or vomiting. 20 tablet 0   OZEMPIC, 1 MG/DOSE, 4 MG/3ML SOPN Inject 2 mg into the skin once a week. PT STATED EITHER SUNDAY OR MONDAY'S     vitamin C (ASCORBIC ACID ) 250 MG tablet Take 250 mg by mouth 3 (three) times a week.     No current facility-administered medications on file prior to visit.    SH:  Smoking No    PHYSICAL EXAMINATION:    Pulse (!) 56   Wt 189 lb 3.2 oz (85.8 kg)   SpO2 99%   BMI 30.54 kg/m     General appearance: alert, cooperative and appears stated age Incisions:  C/D/I  Pelvic: External genitalia:  no lesions              Urethra:  normal appearing urethra with no masses, tenderness or lesions              Bartholins and Skenes: normal                 Vagina: normal  appearing vagina with normal color and discharge, no lesions              Cervix: no lesions              Bimanual Exam:  Uterus:  enlarged, 10 weeks size              Adnexa: no mass, fullness, tenderness  Chaperone present for exam  Assessment/Plan: 1. Endometrial polyp (Primary) - pathology and images reviewed with pt.  Advised to call when any future bleeding.    2. Intramural leiomyoma of uterus - stable on exam

## 2024-04-17 ENCOUNTER — Encounter (HOSPITAL_BASED_OUTPATIENT_CLINIC_OR_DEPARTMENT_OTHER): Payer: Self-pay | Admitting: Obstetrics & Gynecology

## 2024-04-28 ENCOUNTER — Encounter (INDEPENDENT_AMBULATORY_CARE_PROVIDER_SITE_OTHER): Payer: Self-pay | Admitting: Gastroenterology
